# Patient Record
Sex: Male | Born: 1940 | Race: White | Hispanic: No | Marital: Married | Smoking: Former smoker
Health system: Southern US, Community
[De-identification: ages and names within clinical notes are randomized; demographics above are authoritative.]

## PROBLEM LIST (undated history)

## (undated) DIAGNOSIS — Z7189 Other specified counseling: Secondary | ICD-10-CM

## (undated) DIAGNOSIS — F32A Depression, unspecified: Secondary | ICD-10-CM

## (undated) DIAGNOSIS — I10 Essential (primary) hypertension: Secondary | ICD-10-CM

## (undated) DIAGNOSIS — D509 Iron deficiency anemia, unspecified: Secondary | ICD-10-CM

## (undated) DIAGNOSIS — I2510 Atherosclerotic heart disease of native coronary artery without angina pectoris: Secondary | ICD-10-CM

## (undated) DIAGNOSIS — K219 Gastro-esophageal reflux disease without esophagitis: Secondary | ICD-10-CM

## (undated) DIAGNOSIS — C9200 Acute myeloblastic leukemia, not having achieved remission: Secondary | ICD-10-CM

## (undated) DIAGNOSIS — F329 Major depressive disorder, single episode, unspecified: Secondary | ICD-10-CM

## (undated) DIAGNOSIS — E785 Hyperlipidemia, unspecified: Secondary | ICD-10-CM

## (undated) DIAGNOSIS — D4620 Refractory anemia with excess of blasts, unspecified: Secondary | ICD-10-CM

## (undated) DIAGNOSIS — N189 Chronic kidney disease, unspecified: Secondary | ICD-10-CM

## (undated) HISTORY — DX: Refractory anemia with excess of blasts, unspecified: D46.20

## (undated) HISTORY — PX: COLONOSCOPY: SHX174

## (undated) HISTORY — DX: Iron deficiency anemia, unspecified: D50.9

## (undated) HISTORY — DX: Atherosclerotic heart disease of native coronary artery without angina pectoris: I25.10

## (undated) HISTORY — PX: EYE SURGERY: SHX253

## (undated) HISTORY — PX: TONSILLECTOMY: SUR1361

## (undated) HISTORY — DX: Major depressive disorder, single episode, unspecified: F32.9

## (undated) HISTORY — DX: Hyperlipidemia, unspecified: E78.5

## (undated) HISTORY — DX: Essential (primary) hypertension: I10

## (undated) HISTORY — DX: Gastro-esophageal reflux disease without esophagitis: K21.9

## (undated) HISTORY — PX: ANGIOPLASTY: SHX39

## (undated) HISTORY — DX: Depression, unspecified: F32.A

---
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
## 2000-10-09 ENCOUNTER — Encounter: Payer: Self-pay | Admitting: Internal Medicine

## 2000-10-09 ENCOUNTER — Encounter: Admission: RE | Admit: 2000-10-09 | Discharge: 2000-10-09 | Payer: Self-pay | Admitting: Internal Medicine

## 2000-11-08 ENCOUNTER — Ambulatory Visit (HOSPITAL_COMMUNITY): Admission: RE | Admit: 2000-11-08 | Discharge: 2000-11-08 | Payer: Self-pay | Admitting: *Deleted

## 2000-11-08 ENCOUNTER — Encounter (INDEPENDENT_AMBULATORY_CARE_PROVIDER_SITE_OTHER): Payer: Self-pay | Admitting: Specialist

## 2001-07-31 ENCOUNTER — Encounter: Payer: Self-pay | Admitting: Family Medicine

## 2001-07-31 ENCOUNTER — Encounter: Admission: RE | Admit: 2001-07-31 | Discharge: 2001-07-31 | Payer: Self-pay | Admitting: Family Medicine

## 2002-03-03 ENCOUNTER — Encounter: Admission: RE | Admit: 2002-03-03 | Discharge: 2002-03-03 | Payer: Self-pay | Admitting: Surgery

## 2002-03-03 ENCOUNTER — Encounter: Payer: Self-pay | Admitting: Surgery

## 2003-04-22 ENCOUNTER — Ambulatory Visit (HOSPITAL_COMMUNITY): Admission: RE | Admit: 2003-04-22 | Discharge: 2003-04-23 | Payer: Self-pay | Admitting: Interventional Cardiology

## 2003-04-26 ENCOUNTER — Ambulatory Visit (HOSPITAL_COMMUNITY): Admission: RE | Admit: 2003-04-26 | Discharge: 2003-04-27 | Payer: Self-pay | Admitting: Interventional Cardiology

## 2004-05-19 ENCOUNTER — Encounter (INDEPENDENT_AMBULATORY_CARE_PROVIDER_SITE_OTHER): Payer: Self-pay | Admitting: Specialist

## 2004-05-19 ENCOUNTER — Ambulatory Visit (HOSPITAL_COMMUNITY): Admission: RE | Admit: 2004-05-19 | Discharge: 2004-05-19 | Payer: Self-pay | Admitting: *Deleted

## 2004-10-03 ENCOUNTER — Ambulatory Visit: Payer: Self-pay | Admitting: Internal Medicine

## 2004-12-19 ENCOUNTER — Ambulatory Visit: Payer: Self-pay | Admitting: Internal Medicine

## 2008-12-10 ENCOUNTER — Encounter: Admission: RE | Admit: 2008-12-10 | Discharge: 2008-12-10 | Payer: Self-pay | Admitting: Family Medicine

## 2010-02-15 ENCOUNTER — Encounter: Admission: RE | Admit: 2010-02-15 | Discharge: 2010-02-15 | Payer: Self-pay | Admitting: Family Medicine

## 2010-10-27 NOTE — Op Note (Signed)
NAMEEMIN, FOREE              ACCOUNT NO.:  [id number]   MEDICAL RECORD NO.:  [id number]          PATIENT TYPE:  AMB   LOCATION:  ENDO                         FACILITY:  [facility]   PHYSICIAN:  Georgiana Spinner, M.D.    DATE OF BIRTH:  31-Aug-1940   DATE OF PROCEDURE:  05/19/2004  DATE OF DISCHARGE:                                 OPERATIVE REPORT   PROCEDURE:  Colonoscopy with biopsy and polypectomy.   INDICATIONS:  Colon polyps, colon cancer screening, patient declined upper  endoscopy despite having symptoms of possible reflux and being told of  screening for Barrett's esophagus.  The anesthesia for this procedure was  Demerol 70 mg, Versed 8 mg.   DESCRIPTION OF PROCEDURE:  With patient mildly sedated in the left lateral  decubitus position, the Olympus videoscopic colonoscope was inserted in the  rectum after normal rectal exam and passed under direct vision to the cecum,  identified by ileocecal valve and appendiceal orifice, both of which were  photographed.  From this point, the colonoscope was slowly withdrawn, taking  circumferential views of the colonic mucosa, stopping first in the splenic  flexure area where there was a polyp seen, photographed, and removed using  hot biopsy forceps technique setting of 20-20 blended current.  We next  stopped in the sigmoid colon where a second polyp was noted, and it too was  removed using hot biopsy forceps technique with the same setting.  A third  polyp was seen in the rectum on direct view which was removed using a snare  cautery technique, and tissue was retrieved for pathology.  The endoscope  was then placed in the retroflexion to view the anal canal from above which  showed internal hemorrhoids.  The endoscope was straightened and withdrawn.  The patient's vital signs and pulse oximeter remained stable.  The patient  tolerated the procedure well without apparent complications.   FINDINGS:  1.  Polyps of splenic flexure, sigmoid  colon, and rectum all removed.  2.  Internal hemorrhoids.   PLAN:  1.  Await biopsy report.  2.  The patient will call me for results and follow up with me as an      outpatient.      GMO/MEDQ  D:  05/19/2004  T:  05/19/2004  Job:  [id number]   cc:   Dellis Anes. Idell Pickles, M.D.  [address]  [place]  [place] [address]  Fax: [phone]

## 2010-10-27 NOTE — Cardiovascular Report (Signed)
   NAME:  Anthony [name], Anthony [name]                        ACCOUNT NO.:  [id number]   MEDICAL RECORD NO.:  [id number]                   PATIENT TYPE:  OIB   LOCATION:  2854                                 FACILITY:  [facility]   PHYSICIAN:  Lesleigh Noe, M.D.            DATE OF BIRTH:  Jun 26, 1940   DATE OF PROCEDURE:  04/26/2003  DATE OF DISCHARGE:                              CARDIAC CATHETERIZATION   INDICATIONS:  High grade proximal LAD lesion.   PROCEDURE PERFORMED:  1. Left heart catheterization.  2. Selective coronary angiogram.  3. Drug-eluting stent left anterior descending proximal segment.   DESCRIPTION OF PROCEDURE:  After informed consent, a 6-French sheath was  placed.  Diagnostic right and left coronary angiography was performed.  We  initially attempted angioplasty on the LAD with a 6-French system but this  did not provide adequate backup.   We upgraded to a 7-French #4 left Judkins guide catheter, Prowater Asahi  guidewire and used a 2.5 x 15 Quantum Maverick which would not cross the  lesion and subsequent CrossSail/Voyager 2.5 x 15 balloon for pre dilatation.  We then placed a Taxus 16 mm x 2.75 mm stent and deployed to 14 atmospheres  which was an effective diameter of 3.0 mm.  Two balloon inflations were  performed.  The patient tolerated the procedure without complications.   The patient was already on Plavix and a bolus followed by infusion of  Angiomax was used.  ACT was documented to be greater than 400 prior to  intracoronary manipulations.  The right coronary which was stented on  April 22, 2003 was widely patent in the region of stent implantation.   CONCLUSION:  1. Successful drug-eluting stent implantation in the proximal left anterior     descending from 85% to 0% with TIMI 3 flow.  2. Continued wide patency of the right coronary artery without evidence of     restenosis or thrombus.   PLAN:  Aspirin and Plavix.  Angiomax was discontinued.  Discharge  April 27, 2003.                                               Lesleigh Noe, M.D.    HWS/MEDQ  D:  04/26/2003  T:  04/26/2003  Job:  [id number]

## 2010-10-27 NOTE — Procedures (Signed)
[facility]  Patient:    Anthony [name], Anthony [name]                  MRN: [id number] Proc. Date: 11/08/00 Adm. Date:  [id number] Attending:  Sabino Gasser                           Procedure Report  PROCEDURE:  Colonoscopy.  INDICATIONS:  Colon polyp.  ANESTHESIA:  Demerol 60 mg, Versed 6 mg.  DESCRIPTION OF PROCEDURE:  With the patient mildly sedated in the left lateral decubitus position, a rectal examination was performed -- which was unremarkable subsequently.  The Olympus videoscopic colonoscope was inserted into the rectum and passed under direct vision to the cecum; identified by the ileocecal valve and appendiceal orifice, both of which were photographed. From this point the colonoscope was slowly withdrawn, taking circumferential views of the entire colonic mucosa, stopping only in the rectum; where a small polyp was seen, photographed and removed using snare cautery technique at a setting of 20/20 blended current.  The endoscope was then placed in retroflexion to the anal canal from above, and this was photographed.  Small hemorrhoids were seen.  The endoscope was straightened and withdrawn through the anal canal.  A skin tag, representing an old external hemorrhoid, was noted and photographed.  The patients vital signs and pulse oximetry remained stable.  The patient tolerated the procedure well and without apparent complications.  FINDINGS:  Rectal polyp, internal and external hemorrhoids.  Otherwise unremarkable colonoscopic examination to the cecum.  PLAN:  Await biopsy report and have the patient call me for results.  To follow up with me as an outpatient. DD:  11/08/00 TD:  11/08/00 Job: [id number]

## 2010-10-27 NOTE — Discharge Summary (Signed)
NAME:  Anthony [name], Anthony [name]                        ACCOUNT NO.:  [id number]   MEDICAL RECORD NO.:  [id number]                   PATIENT TYPE:  OIB   LOCATION:  6522                                 FACILITY:  [facility]   PHYSICIAN:  Lyn Records, M.D.                DATE OF BIRTH:  [birth year]   DATE OF ADMISSION:  04/22/2003  DATE OF DISCHARGE:  04/23/2003                                 DISCHARGE SUMMARY   ADMISSION DIAGNOSES:  1. Chest pain, suspected coronary artery disease.  2. Chronic obstructive pulmonary disease .  3. Gastroesophageal reflux disease.  4. Hypertension.   DISCHARGE DIAGNOSES:  1. Coronary artery disease, status post percutaneous transluminal coronary     angioplasty and stent placement to the right coronary artery with     residual disease to the left anterior descending artery and diagonal.  2. Hypertension, controlled.  3. Gastroesophageal reflux disease, stable.  4. Chronic obstructive pulmonary disease, stable.   PROCEDURE:  Left heart catheterization, April 22, 2003.   COMPLICATIONS:  None.   DISCHARGE STATUS:  Stable.  Improved.   HISTORY OF PRESENT ILLNESS:  Anthony [name] is a very pleasant 70 year old male  who has had a longstanding history of reflux-type symptoms.  Initial  evaluation approximately a year ago involved stress Cardiolite study that  showed normal LV function and questionable inferior basil ischemia.  Because  the patient was essentially asymptomatic, no other interventions or  diagnostics were performed.  He returns at the suggestion of Dr. Idell Pickles with  episodes of chest discomfort and dyspnea on exertion.  He was set up for  outpatient cardiac catheterization.   PHYSICAL EXAMINATION:  Please see complete note in chart.  VITAL SIGNS:  Essentially, vital signs are stable.  GENERAL:  He is in no acute distress.  Breath sounds are clear.  HEART:  Regular rate and rhythm with an S4.  EXTREMITIES:He had no peripheral edema.  Peripheral  pulses were 2+  bilaterally.   LABORATORY DATA:  EKG showed a normal sinus rhythm with no ischemic changes.   CBC, CMP and coag studies were all normal.   HOSPITAL COURSE:  Patient presented to the cardiac cath lab on April 22, 2003.  Dr. Katrinka Blazing did a diagnostic heart cath which revealed a normal left  main.  LAD had a proximal area of 80% calcification.  Circumflex was okay.  RCA had an area proximally of 95%.  He underwent successful PCI and Taxus  stent placement to the RCA with TIMI III flow post procedure.  It was  decided to bring him back in approximately one week for intervention to the  LAD.   Patient tolerated the procedure well.  Vital signs remained stable.  Sheath  removal was without incident.  He was discharged home on April 23, 2003.   DISCHARGE MEDICATIONS:  1. Aspirin 81 mg daily.  2. Plavix 75 mg daily.  3. Toprol  XL 50 mg daily.  4. Micardis 40 mg daily.  5. MVI daily.   He has been instructed not to undertake any strenuous activity over the next  three days.  He is not  to lift anything heavier than 5 or 10 pounds.  He is  not to do a lot of bending, stooping, or straining.  He is to maintain a low  salt, low fat, low cholesterol diet.  He may shower and gently wash his  right groin cath site with warm soap and water.  He was instructed that if  he has any pain, swelling, or excessive bruising to his cath site, he is to  contact Dr. Michaelle Copas office.   Patient has elected to proceed with PCI as soon as possible.  Therefore, he  has been scheduled for PCI to the LAD diagonal on Monday, April 26, 2003.  He will have a BMET drawn STAT the morning of the procedure.  He has been  instructed to return to the hospital at 6 a.m. to initiate pre cath orders.      Adrian Saran, N.P.                        Lyn Records, M.D.    HB/MEDQ  D:  04/23/2003  T:  04/23/2003  Job:  [id number]

## 2010-10-27 NOTE — Cardiovascular Report (Signed)
NAME:  Anthony [name], Anthony [name]                        ACCOUNT NO.:  [id number]   MEDICAL RECORD NO.:  [id number]                   PATIENT TYPE:  OIB   LOCATION:  6522                                 FACILITY:  [facility]   PHYSICIAN:  Lesleigh Noe, M.D.            DATE OF BIRTH:  1941/05/16   DATE OF PROCEDURE:  04/22/2003  DATE OF DISCHARGE:                              CARDIAC CATHETERIZATION   INDICATIONS FOR PROCEDURE:  Recent anginal episodes.  Prior history of  coronary calcification documented by multislice CT scan. Prior mildly  abnormal inferior wall perfusion by Cardiolite one year ago.   DATE OF PROCEDURE:  April 22, 2003.   PROCEDURE PERFORMED:  1. Left heart catheterization.  2. Selective coronary angiography.  3. Left ventriculography.  4. Drug-eluting stent in the proximal right coronary.   DESCRIPTION:  After informed consent, a 5-French sheath was placed in the  right femoral artery using modified Seldinger technique.  A 4-French  preformed Judkins #4 left and right catheters were used for left  ventriculography, right coronary angiography and left coronary angiography  respectively.  After reviewing the scintiangiograms, it was felt that both  the LAD and the right coronary needed PCI.  Because of the RCA was the  largest and most likely the vessel causing symptoms, we felt that  intervention on this vessel was necessary.  We upgraded to a 7-French  system, give a double bolus followed by infusion of  Integrelin and a  heparin bolus of 4500 units.  ACT was documented to be greater than 230 and  the procedure was started.  We used a short IQ steerable guide wire, a JR-4  side hole 7-French guide catheter. After safely passing two IQ wires into  the distal right coronary for vessel straightening and support, we did  angioplasty using a 15 x 3.0 Maverick balloon.  One balloon inflation at 10  atmospheres was performed and then a 16-mm  long by 3.0 mm diameter  Taxus  stent was deployed within the previously predilated region.  Peak pressure  was 16 atmospheres.  Chest discomfort occurred with each inflation.  Post  procedure angiographic results were very acceptable without evidence of  reduced flow, dissection or thrombus. The case was terminated.  ACT post  procedure is not known by me at this time.  Integrelin will infuse for 18  hours.   RESULTS:   I. HEMODYNAMIC DATA:  A.  Aortic pressure 141/73.  B.  Left ventricular pressure 141/17.   II. LEFT VENTRICULOGRAPHY:  The left ventricle size and function is normal.  EF is 70%.  No MR.   III. CORONARY ANGIOGRAPHY:  Heavy coronary calcification is noted.  A.  Left main coronary:  Free of any significant obstruction.  B.  Left anterior descending coronary:  The LAD is small, does not reach the  left ventricular apex.  It gives origin to two large septal perforators.  There is segmental heavy calcium in the proximal and mid RCA and there is a  75-85% segmental stenosis in the proximal segment extending into the mid  segment.  Diffuse disease is noted beyond the second septal perforator.  C.  Circumflex artery:  Circumflex is large.  Gives several branches and is  free of any significant obstruction.  D.  Right coronary:  The right coronary artery is large, tortuous, calcified  and continues in eccentric 90% proximal stenosis.  Moderate irregularities  noted in the mid RCA and 50% stenosis in the proximal PDA.   IV. PERCUTANEOUS CORONARY INTERVENTION:  After treatment as described above,  the 90-95% proximal RCA stenosis was reduced to 0% with TIMI-3 flow with  Taxus 16 x 3.0 stent.   CONCLUSIONS:  1. Severe calcified coronary artery disease with significant proximal left     anterior descending (left anterior descending is relatively small) and     high grade proximal right coronary artery.  The circumflex is free of any     significant disease.  2. Normal left ventricular function.  3.  Successful drug-eluting Taxus stent implantation into the proximal right     coronary artery reducing the stenosis from 90% to 0%.   PLAN:  1. Integrelin x18 hours.  2. Aspirin and Plavix.  3. Discharge in a left anterior descending intervention in one to two weeks.                                               Lesleigh Noe, M.D.    HWS/MEDQ  D:  04/22/2003  T:  04/23/2003  Job:  [id number]   cc:   Dellis Anes. Idell Pickles, M.D.  [address]  [place]  [place] [address]  Fax: [phone]

## 2010-11-24 ENCOUNTER — Inpatient Hospital Stay (HOSPITAL_BASED_OUTPATIENT_CLINIC_OR_DEPARTMENT_OTHER)
Admission: RE | Admit: 2010-11-24 | Discharge: 2010-11-24 | Disposition: A | Discharge status: Home / Self Care | Payer: Medicare Other | Source: Ambulatory Visit | Attending: Interventional Cardiology | Admitting: Interventional Cardiology

## 2010-11-24 DIAGNOSIS — R5383 Other fatigue: Secondary | ICD-10-CM | POA: Insufficient documentation

## 2010-11-24 DIAGNOSIS — T82897A Other specified complication of cardiac prosthetic devices, implants and grafts, initial encounter: Secondary | ICD-10-CM | POA: Insufficient documentation

## 2010-11-24 DIAGNOSIS — R5381 Other malaise: Secondary | ICD-10-CM | POA: Insufficient documentation

## 2010-11-24 DIAGNOSIS — I2510 Atherosclerotic heart disease of native coronary artery without angina pectoris: Secondary | ICD-10-CM | POA: Insufficient documentation

## 2010-11-24 DIAGNOSIS — Y831 Surgical operation with implant of artificial internal device as the cause of abnormal reaction of the patient, or of later complication, without mention of misadventure at the time of the procedure: Secondary | ICD-10-CM | POA: Insufficient documentation

## 2010-12-14 NOTE — Cardiovascular Report (Signed)
  [name], Anthony NO.:  [id number]  MEDICAL RECORD NO.:  [id number]  LOCATION:                                 FACILITY:  PHYSICIAN:  Lyn Records, M.D.   DATE OF BIRTH:  May 29, 1941  DATE OF PROCEDURE:  11/24/2010 DATE OF DISCHARGE:                           CARDIAC CATHETERIZATION   ADDENDUM  The patient received a total of 75 mL of contrast.     Lyn Records, M.D.     HWS/MEDQ  D:  11/24/2010  T:  11/25/2010  Job:  [id number]  Electronically Signed by Verdis Prime M.D. on 12/14/2010 01:38:16 PM

## 2010-12-14 NOTE — Cardiovascular Report (Signed)
NAMEMARVIN, [name] NO.:  [id number]  MEDICAL RECORD NO.:  [id number]  LOCATION:                                 FACILITY:  PHYSICIAN:  Anthony [name], M.D.        DATE OF BIRTH:  DATE OF PROCEDURE:  11/24/2010 DATE OF DISCHARGE:                           CARDIAC CATHETERIZATION   INDICATIONS:  The patient has experienced a 11-month history of exertional fatigue.  No chest pain.  He has a prior history of exertional fatigue that identified severe LAD and right coronary artery disease resulting in stenting.  A recent nuclear study suggested inferobasal and mid inferior wall ischemia.  Therefore, this study is being done to rule out restenosis and/or progression of coronary artery disease.  The patient is known to have hypertension, hyperlipidemia, and renal insufficiency.  Lisinopril has been held for 48 hours prior to the procedure.  We are cognizant of contrast potential for renal injury.  PROCEDURES PERFORMED: 1. Left heart catheterization. 2. Left ventriculography by hand injection. 3. Selective coronary angiography.  DESCRIPTION:  A 4-French sheath was placed after 1% Xylocaine local infiltration of the right femoral.  The modified Seldinger technique was used.  A 4-French #4 left Judkins catheter was used for left coronary angiography.  A 4-French modified right coronary catheter was used for right coronary angiography.  The pigtail catheter was used for hand injection of the left ventricle.  200 mcg of intracoronary nitroglycerin was given in the right coronary artery.  The patient tolerated the procedure without complications.  RESULTS: 1. Hemodynamic data:     a.     Aortic pressure 135/68.     b.     Left ventricular pressure 130/27. 2. Left ventriculography:  There is mild mid to distal inferior wall     hypokinesis.  EF is greater than 60%. 3. Coronary angiography:  Heavy three-vessel coronary calcification is     noted with  cinefluoroscopy.     a.     Left main coronary artery:  Widely patent.     b.     Left anterior descending coronary artery:  LAD contains a      proximal stent that is widely patent.  Proximal to the stent,      there is 50% obstruction.  The LAD is otherwise relatively small      in distribution supplying an anterior wall, but does not wrap      around the left ventricular apex.  The vessel is heavily calcified      in the mid segment.  There is 50-70% stenosis in the mid vessel      overlapping origin of the first septal perforator.     c.     Circumflex artery:  The circumflex coronary artery is      moderately to heavily calcified.  There are luminal      irregularities.  No high-grade distal obstruction is noted.     d.     Right coronary artery:  The right coronary artery is heavily      calcified.  The proximal stent is widely patent.  There is heavy  calcification throughout the proximal and mid right coronary      artery.  The mid segment is highly diseased with calcification and      moderate stenosis.  No obstruction greater than 60% is noted.  The      distal portion of the right coronary artery is large.  The PDA      wraps around the left ventricular apex.  The PDA stent is patent      with perhaps 50%-60% ISR in the distal margin.  This likely does      not account for the patient's abnormal appearance on nuclear      testing since the apex was not involved and the area of possible      ischemia was inferobasal and mid inferior wall.  CONCLUSIONS: 1. Moderate to moderately severe mid LAD disease.  In the absence of     ischemia on the nuclear study, this area will be managed medically.     The LAD stents in the proximal vessel is widely patent. 2. The right coronary artery stents are patent.  There is mild to     moderate distal PDA ISR.  The PDA is large and beyond the stent     supplies a mid distal and apical left ventricular wall. 3. The abnormal nuclear study  likely represents artifact perhaps due     the diaphragm attenuation as there is no disease that would account     for inferobasal ischemia. 4. Normal left ventricular function.  PLAN:  Continue medical therapy and aggressive risk factor modification. If the patient develops angina or progressive symptoms or at some point in the future LAD territory ischemia or distal inferior wall apical ischemia develop, we would consider repeat cath or PCI.     Anthony [name], M.D.     HWS/MEDQ  D:  11/24/2010  T:  11/25/2010  Job:  [id number]  cc:   Anthony [name], M.D.  Electronically Signed by Verdis Prime M.D. on 12/14/2010 01:38:20 PM

## 2011-01-26 ENCOUNTER — Other Ambulatory Visit: Payer: Self-pay | Admitting: Family Medicine

## 2011-11-26 ENCOUNTER — Other Ambulatory Visit: Payer: Self-pay | Admitting: Family Medicine

## 2012-06-17 ENCOUNTER — Ambulatory Visit (INDEPENDENT_AMBULATORY_CARE_PROVIDER_SITE_OTHER)
Admission: RE | Admit: 2012-06-17 | Discharge: 2012-06-17 | Disposition: A | Discharge status: Home / Self Care | Payer: Medicare Other | Source: Ambulatory Visit | Attending: Pulmonary Disease | Admitting: Pulmonary Disease

## 2012-06-17 ENCOUNTER — Ambulatory Visit (INDEPENDENT_AMBULATORY_CARE_PROVIDER_SITE_OTHER): Payer: Medicare Other | Admitting: Pulmonary Disease

## 2012-06-17 ENCOUNTER — Encounter: Payer: Self-pay | Admitting: Pulmonary Disease

## 2012-06-17 VITALS — BP 138/82 | HR 67 | Temp 97.7°F | Ht 72.0 in | Wt 218.2 lb

## 2012-06-17 DIAGNOSIS — J449 Chronic obstructive pulmonary disease, unspecified: Secondary | ICD-10-CM

## 2012-06-17 NOTE — Assessment & Plan Note (Signed)
The patient has moderate airflow obstruction by his recent spirometry, but is remaining very functional and not having acute exacerbations.  Although he is not dissatisfied with his exertional tolerance, he does wish that he could do better.  I have explained that part of the problem may be his weight and conditioning, but I think we should give him a trial of a LABA added to his Spiriva to see if that makes a difference.  If it does not, I would maintain him on Spiriva alone.  He will also need to have a rescue inhaler available for emergency use.  We have checked his ambulatory oximetry today in the office, and the patient does not have any significant desaturation.

## 2012-06-17 NOTE — Patient Instructions (Addendum)
Stay on spiriva once a day Will try arcapta one inhalation each am for next 3 weeks.  Let me know if you think this makes a significant difference in your breathing. Will start on an albuterol inhaler only for emergencies.  Can take 2 inhalations every 6 hrs if needed. Will check cxr today, and call you with results. Work on [employer] loss and conditioning program. followup with me in 6mos to see how things are going.  Please call if having issues with your breathing.

## 2012-06-17 NOTE — Progress Notes (Signed)
  Subjective:    Patient ID: Anthony [name], male    DOB: 03/05/1941, 71 y.o.   MRN: [id number]  HPI The patient is a 72 year old male who been asked to see for management of COPD.  The patient tells me that he was diagnosed about 6 years ago, and has been on Spiriva from [place] for years.  He has had recent spirometry with his primary physician, and found to have moderate airflow obstruction.  The patient is very active, and goes to the [facility] 2-3 times a week.  He walks at least a mile without severe shortness of breath, but can get winded up any incline or if he has a load in his arms.  He has no significant cough or mucus production, and has not smoked since 2000.  He has not had a recent chest x-ray.  He does have underlying coronary disease, but tells me that he has never had a myocardial infarction.  He does have some intermittent lower extremity edema.  The patient states that his weight is up 10 pounds over the last 1-2 years.  The patient is concerned about his long-term prognosis and functional status.   Review of Systems  Constitutional: Negative for fever and unexpected weight change.  HENT: Positive for congestion, rhinorrhea and sneezing. Negative for ear pain, nosebleeds, sore throat, trouble swallowing, dental problem, postnasal drip and sinus pressure.   Eyes: Negative for redness and itching.  Respiratory: Positive for shortness of breath and wheezing. Negative for cough and chest tightness.   Cardiovascular: Negative for palpitations and leg swelling.  Gastrointestinal: Negative for nausea and vomiting.       Acid heartburn  Genitourinary: Negative for dysuria.  Musculoskeletal: Positive for joint swelling and arthralgias.  Skin: Negative for rash.  Neurological: Negative for headaches.  Hematological: Does not bruise/bleed easily.  Psychiatric/Behavioral: Positive for dysphoric mood. The patient is nervous/anxious.        Objective:   Physical Exam Constitutional:   Overweight male, no acute distress  HENT:  Nares patent without discharge  Oropharynx without exudate, palate and uvula are normal  Eyes:  Perrla, eomi, no scleral icterus  Neck:  No JVD, no TMG  Cardiovascular:  Normal rate, regular rhythm, no rubs or gallops.  No murmurs        Intact distal pulses  Pulmonary :  decreased breath sounds, no stridor or respiratory distress   No rales, rhonchi, or wheezing  Abdominal:  Soft, nondistended, bowel sounds present.  No tenderness noted.   Musculoskeletal:  mild lower extremity edema noted.  Lymph Nodes:  No cervical lymphadenopathy noted  Skin:  No cyanosis noted  Neurologic:  Alert, appropriate, moves all 4 extremities without obvious deficit.         Assessment & Plan:

## 2012-07-10 ENCOUNTER — Telehealth: Payer: Self-pay | Admitting: Pulmonary Disease

## 2012-07-10 MED ORDER — INDACATEROL MALEATE 75 MCG IN CAPS: 1.0000 | ORAL_CAPSULE | RESPIRATORY_TRACT | Status: DC

## 2012-07-10 NOTE — Telephone Encounter (Signed)
Pt states he has seen approx 25% improvement in his breathing since starting on Arcapta. Pt is requesting an RX. KC, pls advise if pt is to continue on this inhaler. Thanks!

## 2012-07-10 NOTE — Telephone Encounter (Signed)
Ok to give him 6mos supply.

## 2012-07-10 NOTE — Telephone Encounter (Signed)
Pt aware and asked that we send a 90 day supply to [facility] in [place]. RX sent.

## 2012-12-15 ENCOUNTER — Ambulatory Visit (INDEPENDENT_AMBULATORY_CARE_PROVIDER_SITE_OTHER): Payer: Medicare Other | Admitting: Pulmonary Disease

## 2012-12-15 ENCOUNTER — Encounter: Payer: Self-pay | Admitting: Pulmonary Disease

## 2012-12-15 VITALS — BP 126/78 | HR 66 | Temp 97.0°F | Ht 72.0 in | Wt 219.4 lb

## 2012-12-15 DIAGNOSIS — J449 Chronic obstructive pulmonary disease, unspecified: Secondary | ICD-10-CM

## 2012-12-15 NOTE — Assessment & Plan Note (Signed)
The pt is doing ok overall, but has had "spells" where his breathing has not done as well.  He admits that he has not used arcapta regularly with spiriva to see if it truly makes a difference or not.  He would like to take the next 30 days, and see if he does better on the dual therapy.  If not, would consider a trial of anoro.  I have also stressed to him the importance of weight loss and conditioning.

## 2012-12-15 NOTE — Progress Notes (Signed)
  Subjective:    Patient ID: Anthony [name], male    DOB: 1941/04/11, 72 y.o.   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  At the last visit, I have given him a trial arcapta with his Spiriva to see if he would notice a difference.  He has tried the arcapta off and on since that time, but admits that he never really took it on a consistent basis to see if that would make a difference.  This summer, he is seen in variable worsening of his breathing at times, but currently is not far from his usual baseline.  It is unclear how much of this is from the heat and humidity.  He denies any significant chest congestion, purulent it is, or symptoms of an acute exacerbation.  The patient is also acutely aware of his concomitant heart disease, and wonders if this could be playing a role.   Review of Systems  Constitutional: Negative for fever and unexpected weight change.  HENT: Negative for ear pain, nosebleeds, congestion, sore throat, rhinorrhea, sneezing, trouble swallowing, dental problem, postnasal drip and sinus pressure.   Eyes: Negative for redness and itching.  Respiratory: Positive for shortness of breath. Negative for cough, chest tightness and wheezing.   Cardiovascular: Negative for palpitations and leg swelling.  Gastrointestinal: Negative for nausea and vomiting.  Genitourinary: Negative for dysuria.  Musculoskeletal: Negative for joint swelling.  Skin: Negative for rash.  Neurological: Negative for headaches.  Hematological: Does not bruise/bleed easily.  Psychiatric/Behavioral: Negative for dysphoric mood. The patient is not nervous/anxious.        Objective:   Physical Exam Ow male in nad Nose without purulence or discharge noted. Neck without LN or TMG Chest with clear bs, no wheezing. Cor with rrr LE without edema, no cyanosis Alert, oriented, moves all 4.        Assessment & Plan:

## 2012-12-15 NOTE — Patient Instructions (Addendum)
Take 30 days and stay on spiriva and arcapta everyday.  Please call at the end of that time period to give me an update, and we can discuss your maintenance meds going forward. Work on conditioning and weight loss followup with me in 6mos.

## 2013-03-23 DIAGNOSIS — K37 Unspecified appendicitis: Secondary | ICD-10-CM | POA: Insufficient documentation

## 2013-06-11 DIAGNOSIS — N189 Chronic kidney disease, unspecified: Secondary | ICD-10-CM

## 2013-06-11 HISTORY — DX: Chronic kidney disease, unspecified: N18.9

## 2013-06-16 ENCOUNTER — Ambulatory Visit (INDEPENDENT_AMBULATORY_CARE_PROVIDER_SITE_OTHER): Payer: Medicare Other | Admitting: Pulmonary Disease

## 2013-06-16 ENCOUNTER — Encounter: Payer: Self-pay | Admitting: Pulmonary Disease

## 2013-06-16 VITALS — BP 114/70 | HR 68 | Temp 97.7°F | Ht 72.0 in | Wt 210.6 lb

## 2013-06-16 DIAGNOSIS — J449 Chronic obstructive pulmonary disease, unspecified: Secondary | ICD-10-CM

## 2013-06-16 NOTE — Assessment & Plan Note (Signed)
The patient appears to be stable from a pulmonary standpoint on Spiriva alone. He is satisfied with his exertional tolerance, and really did not see a difference with the addition of arcapta. I've asked him to stay on his current medications, and to work on conditioning.

## 2013-06-16 NOTE — Patient Instructions (Signed)
No change in current medications. Try and stay active, and work on weight loss followup with me again in 43mos.

## 2013-06-16 NOTE — Progress Notes (Signed)
   Subjective:    Patient ID: Anthony [name], male    DOB: 09-25-1940, 73 y.o.   MRN: [id number]  HPI Patient comes in today for followup of his known COPD. He is doing very well on his current medication, and really did not see a big change in his breathing with the addition of arcapta. He is been trying to stay active, but has been limited by a recent appendectomy. He denies any cough, congestion, or purulent mucus.   Review of Systems  Constitutional: Negative for fever and unexpected weight change.  HENT: Negative for congestion, dental problem, ear pain, nosebleeds, postnasal drip, rhinorrhea, sinus pressure, sneezing, sore throat and trouble swallowing.   Eyes: Negative for redness and itching.  Respiratory: Negative for cough, chest tightness, shortness of breath and wheezing.   Cardiovascular: Negative for palpitations and leg swelling.  Gastrointestinal: Negative for nausea and vomiting.  Genitourinary: Negative for dysuria.  Musculoskeletal: Negative for joint swelling.  Skin: Negative for rash.  Neurological: Negative for headaches.  Hematological: Does not bruise/bleed easily.  Psychiatric/Behavioral: Negative for dysphoric mood. The patient is not nervous/anxious.        Objective:   Physical Exam Overweight male in no acute distress Nose without purulence or discharge noted Neck without lymphadenopathy or thyromegaly Chest with mildly decreased breath sounds, no wheezing Cardiac exam with regular rate and rhythm Lower extremities with trace ankle edema, no cyanosis Alert and oriented, moves all 4 extremities.       Assessment & Plan:

## 2013-06-17 ENCOUNTER — Ambulatory Visit: Payer: Medicare Other | Admitting: Pulmonary Disease

## 2013-09-07 ENCOUNTER — Encounter: Payer: Self-pay | Admitting: Interventional Cardiology

## 2013-09-07 ENCOUNTER — Ambulatory Visit (INDEPENDENT_AMBULATORY_CARE_PROVIDER_SITE_OTHER): Payer: Medicare Other | Admitting: Interventional Cardiology

## 2013-09-07 ENCOUNTER — Telehealth: Payer: Self-pay | Admitting: Interventional Cardiology

## 2013-09-07 VITALS — BP 120/67 | HR 67 | Ht 72.0 in | Wt 213.0 lb

## 2013-09-07 DIAGNOSIS — R0600 Dyspnea, unspecified: Secondary | ICD-10-CM

## 2013-09-07 DIAGNOSIS — R0609 Other forms of dyspnea: Secondary | ICD-10-CM

## 2013-09-07 DIAGNOSIS — I10 Essential (primary) hypertension: Secondary | ICD-10-CM | POA: Insufficient documentation

## 2013-09-07 DIAGNOSIS — R0789 Other chest pain: Secondary | ICD-10-CM | POA: Insufficient documentation

## 2013-09-07 DIAGNOSIS — R42 Dizziness and giddiness: Secondary | ICD-10-CM

## 2013-09-07 DIAGNOSIS — R0989 Other specified symptoms and signs involving the circulatory and respiratory systems: Secondary | ICD-10-CM

## 2013-09-07 DIAGNOSIS — E785 Hyperlipidemia, unspecified: Secondary | ICD-10-CM

## 2013-09-07 DIAGNOSIS — I2510 Atherosclerotic heart disease of native coronary artery without angina pectoris: Secondary | ICD-10-CM | POA: Insufficient documentation

## 2013-09-07 LAB — COMPREHENSIVE METABOLIC PANEL
ALT: 20 U/L (ref 0–53)
AST: 25 U/L (ref 0–37)
Albumin: 4.1 g/dL (ref 3.5–5.2)
Alkaline Phosphatase: 30 U/L — ABNORMAL LOW (ref 39–117)
BILIRUBIN TOTAL: 0.4 mg/dL (ref 0.3–1.2)
BUN: 29 mg/dL — ABNORMAL HIGH (ref 6–23)
CALCIUM: 9.4 mg/dL (ref 8.4–10.5)
CHLORIDE: 102 meq/L (ref 96–112)
CO2: 29 meq/L (ref 19–32)
CREATININE: 1.5 mg/dL (ref 0.4–1.5)
GFR: 47.02 mL/min — AB (ref >60.00)
GLUCOSE: 100 mg/dL — AB (ref 70–99)
Potassium: 4.5 mEq/L (ref 3.5–5.1)
SODIUM: 138 meq/L (ref 135–145)
TOTAL PROTEIN: 7.4 g/dL (ref 6.0–8.3)

## 2013-09-07 LAB — BRAIN NATRIURETIC PEPTIDE: PRO B NATRI PEPTIDE: 38 pg/mL (ref 0.0–100.0)

## 2013-09-07 NOTE — Progress Notes (Signed)
Patient ID: JORELL AGNE, male   DOB: Jan 03, 1941, 73 y.o.   MRN: [id number]    [address], [address] [place], [place]  [address] Phone: [phone] Fax:  [phone]  Date:  09/07/2013   ID:  JOAH PATLAN, DOB 08-03-1940, MRN [id number]  PCP:  Abigail Miyamoto, MD   ASSESSMENT:  1. Chest pain 2 Coronary artery disease with prior LAD (DES) and RCA (DES) stent 3. Dyspnea 4. Hypertension 5. Hyperlipidemia 6. Dizziness, with standing  PLAN:  1. Will do a pharmacologic stress nuclear perfusion study to rule out progression of underlying coronary disease given the history of atypical chest pain. 2. Because of dyspnea, we will check a BNP 3. Because of fatigue and hypertension I will check a metabolic panel to rule out electrolyte disturbance 4. If above studies are unremarkable, no further workup   SUBJECTIVE: Anthony [name] is a 73 y.o. male who is been experiencing orthostatic dizziness and chest pain. He called this morning and request an appointment. He is currently pain-free. He has known coronary disease with previous drug-eluting stents in the RCA and LAD (both her Taxus) catheterization 2012 revealed 50-70% mid LAD stenosis and distal RCA 50-70% stenosis. There was moderate LAD ISR. Stents were placed in 2004. He describes the chest pain is a mild heaviness that lasts minutes. It occurs at rest. It is not precipitated by a particular events. He also has been having lightheadedness/dizziness when he changes posture going from lying or sitting to standing. It takes several seconds with a failing to resolve.  His been having some lower abdominal discomfort.  He has a history of hypertension, COPD, and hyperlipidemia.   Wt Readings from Last 3 Encounters:  09/07/13 213 lb (96.616 kg)  06/16/13 210 lb 9.6 oz (95.528 kg)  12/15/12 219 lb 6.4 oz (99.519 kg)     Past Medical History  Diagnosis Date  . HTN (hypertension)   . Hyperlipidemia   . Coronary  atherosclerosis of native coronary artery   . LPRD (laryngopharyngeal reflux disease)   . Depressive disorder     Current Outpatient Prescriptions  Medication Sig Dispense Refill  . aspirin 81 MG tablet Take 81 mg by mouth daily.      Marland Kitchen BYSTOLIC 5 MG tablet Take 5 mg by mouth daily.       . Cholecalciferol (VITAMIN D3) 2000 UNITS TABS Take 2,000 Units by mouth daily.      . clopidogrel (PLAVIX) 75 MG tablet Take 75 mg by mouth daily.       Marland Kitchen co-enzyme Q-10 30 MG capsule Take 100 mg by mouth daily.      . Ferrous Sulfate (IRON) 325 (65 FE) MG TABS Take 1 tablet by mouth daily.      . fish oil-omega-3 fatty acids 1000 MG capsule Take 1 g by mouth daily.      . Flaxseed, Linseed, (FLAX SEED OIL) 1000 MG CAPS Take 1,000 mg by mouth daily.      Marland Kitchen losartan (COZAAR) 50 MG tablet Take 50 mg by mouth daily.       . meclizine (ANTIVERT) 25 MG tablet Take 25 mg by mouth 3 (three) times daily as needed.      . pantoprazole (PROTONIX) 40 MG tablet Take 40 mg by mouth daily.       . ranitidine (ZANTAC) 300 MG tablet Take 300 mg by mouth at bedtime.       . sertraline (ZOLOFT) 100 MG tablet Take  50 mg by mouth daily.       . simvastatin (ZOCOR) 40 MG tablet Take 40 mg by mouth every other day.       . tiotropium (SPIRIVA) 18 MCG inhalation capsule Place 18 mcg into inhaler and inhale daily.      . vitamin B-12 (CYANOCOBALAMIN) 1000 MCG tablet Take 1,000 mcg by mouth daily.       No current facility-administered medications for this visit.    Allergies:   No Known Allergies  Social History:  The patient  reports that he quit smoking about 15 years ago. His smoking use included Cigarettes. He has a 80 pack-year smoking history. He does not have any smokeless tobacco history on file. He reports that he drinks alcohol. He reports that he does not use illicit drugs.   ROS:  Please see the history of present illness.   Denies melena.   All other systems reviewed and negative.   OBJECTIVE: VS:  BP 129/66   Pulse 68  Ht 6' (1.829 m)  Wt 213 lb (96.616 kg)  BMI 28.88 kg/m2 no orthostasis is noted  Well nourished, well developed, in no acute distress, older than stated age 51: normal Neck: JVD flat. Carotid bruit absent  Cardiac:  normal S1, S2; RRR; no murmur Lungs:  clear to auscultation bilaterally, no wheezing, rhonchi or rales Abd: soft, nontender, no hepatomegaly Ext: Edema absent. Pulses 2+ and symmetric Skin: warm and dry Neuro:  CNs 2-12 intact, no focal abnormalities noted  EKG:  Normal sinus rhythm with normal tracing       Signed, Illene Labrador III, MD 09/07/2013 2:44 PM

## 2013-09-07 NOTE — Patient Instructions (Addendum)
Your physician recommends that you continue on your current medications as directed. Please refer to the Current Medication list given to you today.  Lab Today: Cmet,Bnp  Your physician has requested that you have en exercise stress myoview. For further information please visit [email or web address]. Please follow instruction sheet, as given.  Follow up pending test results

## 2013-09-07 NOTE — Telephone Encounter (Signed)
New message     Patient calling C/O stay very tired. Standing up gets dizzy , some slight pressure on chest - nothing urgent. Patient stated this is same symptoms he had before when he stent placement. Would like to have an appt today or this week.

## 2013-09-07 NOTE — Telephone Encounter (Signed)
returned pt call.pt sts that he is experiencing some dizziness and is tired all the time.pt has not checked his bp or heartrate.pt will ck his bp with his device at home and call back with reading.

## 2013-09-07 NOTE — Addendum Note (Signed)
Addended by: Eulis Foster on: 09/07/2013 03:29 PM   Modules accepted: Orders

## 2013-09-07 NOTE — Telephone Encounter (Signed)
pt called back with bp reading.132/83 hr 60bpm.pt sts that he is dizzy when he goes from sitting to standing.have some chest discomfort and would like to be seen.appt made for today @ 2:30pm with Dr.Smith.pt verbalized understanding.

## 2013-09-08 ENCOUNTER — Telehealth: Payer: Self-pay

## 2013-09-08 NOTE — Telephone Encounter (Signed)
pt given lab results.  Lab work is okay .pt verbalized understanding

## 2013-09-08 NOTE — Telephone Encounter (Signed)
Message copied by Lamar Laundry on Tue Sep 08, 2013 10:33 AM ------      Message from: Daneen Schick      Created: Mon Sep 07, 2013  5:33 PM       Lab work is okay ------

## 2013-09-17 ENCOUNTER — Ambulatory Visit (HOSPITAL_COMMUNITY): Payer: Medicare Other | Attending: Cardiology | Admitting: Radiology

## 2013-09-17 ENCOUNTER — Encounter: Payer: Self-pay | Admitting: Cardiology

## 2013-09-17 VITALS — BP 155/76 | HR 65 | Ht 72.0 in | Wt 210.0 lb

## 2013-09-17 DIAGNOSIS — R9431 Abnormal electrocardiogram [ECG] [EKG]: Secondary | ICD-10-CM | POA: Insufficient documentation

## 2013-09-17 DIAGNOSIS — Z8249 Family history of ischemic heart disease and other diseases of the circulatory system: Secondary | ICD-10-CM | POA: Insufficient documentation

## 2013-09-17 DIAGNOSIS — Z9861 Coronary angioplasty status: Secondary | ICD-10-CM | POA: Insufficient documentation

## 2013-09-17 DIAGNOSIS — J4489 Other specified chronic obstructive pulmonary disease: Secondary | ICD-10-CM | POA: Insufficient documentation

## 2013-09-17 DIAGNOSIS — I10 Essential (primary) hypertension: Secondary | ICD-10-CM | POA: Insufficient documentation

## 2013-09-17 DIAGNOSIS — R42 Dizziness and giddiness: Secondary | ICD-10-CM

## 2013-09-17 DIAGNOSIS — R0609 Other forms of dyspnea: Secondary | ICD-10-CM | POA: Insufficient documentation

## 2013-09-17 DIAGNOSIS — E785 Hyperlipidemia, unspecified: Secondary | ICD-10-CM

## 2013-09-17 DIAGNOSIS — Z87891 Personal history of nicotine dependence: Secondary | ICD-10-CM | POA: Insufficient documentation

## 2013-09-17 DIAGNOSIS — R079 Chest pain, unspecified: Secondary | ICD-10-CM | POA: Insufficient documentation

## 2013-09-17 DIAGNOSIS — R0602 Shortness of breath: Secondary | ICD-10-CM | POA: Insufficient documentation

## 2013-09-17 DIAGNOSIS — R0789 Other chest pain: Secondary | ICD-10-CM

## 2013-09-17 DIAGNOSIS — I2510 Atherosclerotic heart disease of native coronary artery without angina pectoris: Secondary | ICD-10-CM | POA: Insufficient documentation

## 2013-09-17 DIAGNOSIS — J449 Chronic obstructive pulmonary disease, unspecified: Secondary | ICD-10-CM | POA: Insufficient documentation

## 2013-09-17 DIAGNOSIS — R0989 Other specified symptoms and signs involving the circulatory and respiratory systems: Secondary | ICD-10-CM | POA: Insufficient documentation

## 2013-09-17 DIAGNOSIS — R0600 Dyspnea, unspecified: Secondary | ICD-10-CM

## 2013-09-17 MED ORDER — TECHNETIUM TC 99M SESTAMIBI GENERIC - CARDIOLITE
10.8000 | Freq: Once | INTRAVENOUS | Status: AC | PRN
  Administered 2013-09-17: 11 via INTRAVENOUS

## 2013-09-17 MED ORDER — REGADENOSON 0.4 MG/5ML IV SOLN
0.4000 mg | Freq: Once | INTRAVENOUS | Status: AC
  Administered 2013-09-17: 0.4 mg via INTRAVENOUS

## 2013-09-17 MED ORDER — TECHNETIUM TC 99M SESTAMIBI GENERIC - CARDIOLITE
33.0000 | Freq: Once | INTRAVENOUS | Status: AC | PRN
  Administered 2013-09-17: 33 via INTRAVENOUS

## 2013-09-17 NOTE — Progress Notes (Signed)
[name] [address] [place], [name] [address] [phone]    Cardiology Nuclear Med Study  Anthony [name] is a 73 y.o. male     MRN : [id number]     DOB: September 23, 1940  Procedure Date: 09/17/2013  Nuclear Med Background Indication for Stress Test:  Evaluation for Ischemia and Stent Patency History:  CAD, Cath, Stent (RCA, LAD), MPI ~4-5 yrs ago, COPD Cardiac Risk Factors: Family History - CAD, History of Smoking, Hypertension and Lipids  Symptoms:  Chest Pain, Dizziness and SOB   Nuclear Pre-Procedure Caffeine/Decaff Intake:  None > 12 hrs NPO After: 8:30pm   Lungs:  clear O2 Sat: 99% on room air. IV 0.9% NS with Angio Cath:  22g  IV Site: R Antecubital x 1, tolerated well IV Started by:  Anthony Baltimore, RN  Chest Size (in):  42 Cup Size: n/a  Height: 6' (1.829 m)  Weight:  210 lb (95.255 kg)  BMI:  Body mass index is 28.47 kg/(m^2). Tech Comments:  Held Bystolic x 24 hrs    Nuclear Med Study 1 or 2 day study: 1 day  Stress Test Type:  Treadmill/Lexiscan  Reading MD: N/A  Order Authorizing Provider:  Daneen Schick, III MD  Resting Radionuclide: Technetium 71m Sestamibi  Resting Radionuclide Dose: 11.0 mCi   Stress Radionuclide:  Technetium 28m Sestamibi  Stress Radionuclide Dose: 33.0 mCi           Stress Protocol Rest HR: 65 Stress HR: 106  Rest BP: 155/76 Stress BP: 172/55  Exercise Time (min): n/a METS: n/a           Dose of Adenosine (mg):  n/a Dose of Lexiscan: 0.4 mg  Dose of Atropine (mg): n/a Dose of Dobutamine: n/a mcg/kg/min (at max HR)  Stress Test Technologist: Anthony [name]  Nuclear Technologist:  Anthony [name]     Rest Procedure:  Myocardial perfusion imaging was performed at rest 45 minutes following the intravenous administration of Technetium 83m Sestamibi. Rest ECG: Normal  Stress Procedure:  The patient received IV Lexiscan 0.4 mg over 15-seconds with concurrent low level exercise and then Technetium  28m Sestamibi was injected at 30-seconds while the patient continued walking one more minute.  Quantitative spect images were obtained after a 45-minute delay.   Attempted to walk patient on Bruce protocol but patient became very fatigued and SOB.  Had to stop and change to Low Level Lexiscan.  During the infusion the patient complained of SOB and lightheadedness.  This resolved in recovery.  Stress ECG: Significant ST abnormalities consistent with ischemia.  QPS Raw Data Images:  Patient motion noted. Stress Images:  Normal homogeneous uptake in all areas of the myocardium. Rest Images:  Normal homogeneous uptake in all areas of the myocardium. Subtraction (SDS):  No evidence of ischemia. Transient Ischemic Dilatation (Normal <1.22):  1.07 Lung/Heart Ratio (Normal <0.45):  0.33  Quantitative Gated Spect Images QGS EDV:  90 ml QGS ESV:  33 ml  Impression Exercise Capacity:  Lexiscan with low level exercise. BP Response:  Normal blood pressure response. Clinical Symptoms:  There is dyspnea. ECG Impression:  Significant ST abnormalities consistent with ischemia. Comparison with Prior Nuclear Study: No images to compare  Overall Impression:  Low risk stress nuclear study Normal perfusion images.  74mm St segment depression in inferior leads with lexiscan and low level exercise.  LV Ejection Fraction: 63%.  LV Wall Motion:  NL LV Function; NL Wall Motion  Anthony [name]

## 2013-09-23 ENCOUNTER — Encounter (HOSPITAL_COMMUNITY): Payer: Medicare Other

## 2013-12-14 ENCOUNTER — Ambulatory Visit (INDEPENDENT_AMBULATORY_CARE_PROVIDER_SITE_OTHER): Payer: Medicare Other | Admitting: Pulmonary Disease

## 2013-12-14 ENCOUNTER — Encounter: Payer: Self-pay | Admitting: Pulmonary Disease

## 2013-12-14 VITALS — BP 118/70 | HR 65 | Temp 97.7°F | Ht 72.0 in | Wt 220.4 lb

## 2013-12-14 DIAGNOSIS — J438 Other emphysema: Secondary | ICD-10-CM

## 2013-12-14 NOTE — Assessment & Plan Note (Signed)
The patient overall has remained stable since his last visit, but is having some issues with the heat and humidity. I would like to try him on a trial of stiolto, and he will let me know if he wishes to stay on this medication.  I have also stressed to him the importance of weight loss and conditioning.

## 2013-12-14 NOTE — Patient Instructions (Signed)
Hold spiriva, and try stiolto 2 inhalations each am for next 2 weeks as a trial.  Let me know if you wish to change to this for your maintenance medication Work on weight loss and exercise program. followup with me again in 75mos

## 2013-12-14 NOTE — Progress Notes (Signed)
   Subjective:    Patient ID: Anthony [name], male    DOB: 01-20-1941, 73 y.o.   MRN: [id number]  HPI Patient comes in today for followup of his known COPD. He is staying on his medications compliantly, and has not had an acute exacerbation or pulmonary infection since last visit. He is trying to get back to his exercise program after having an appendectomy, but it has been slow going. He denies any chest congestion or cough with purulence. He does feel like he humidity has made a little more difficult to breathe of late.   Review of Systems  Constitutional: Negative for fever and unexpected weight change.  HENT: Negative for congestion, dental problem, ear pain, nosebleeds, postnasal drip, rhinorrhea, sinus pressure, sneezing, sore throat and trouble swallowing.   Eyes: Negative for redness and itching.  Respiratory: Positive for shortness of breath and wheezing. Negative for cough and chest tightness.   Cardiovascular: Negative for palpitations and leg swelling.  Gastrointestinal: Negative for nausea and vomiting.  Genitourinary: Negative for dysuria.  Musculoskeletal: Negative for joint swelling.  Skin: Negative for rash.  Neurological: Negative for headaches.  Hematological: Does not bruise/bleed easily.  Psychiatric/Behavioral: Negative for dysphoric mood. The patient is not nervous/anxious.        Objective:   Physical Exam Overweight male in no acute distress Nose without purulence or discharge noted Neck without lymphadenopathy or thyromegaly Chest with mildly decreased breath sounds, no active wheezing Cardiac exam with regular rate and rhythm Lower extremities with mild ankle edema, no cyanosis Alert and oriented, moves all 4 extremities.       Assessment & Plan:

## 2014-01-01 ENCOUNTER — Encounter: Payer: Self-pay | Admitting: Family

## 2014-01-01 ENCOUNTER — Other Ambulatory Visit: Payer: Medicare Other | Admitting: Lab

## 2014-01-01 ENCOUNTER — Ambulatory Visit: Payer: Medicare Other

## 2014-01-01 ENCOUNTER — Encounter: Payer: Self-pay | Admitting: Hematology & Oncology

## 2014-01-01 ENCOUNTER — Ambulatory Visit (HOSPITAL_BASED_OUTPATIENT_CLINIC_OR_DEPARTMENT_OTHER): Payer: Medicare Other | Admitting: Family

## 2014-01-01 VITALS — BP 127/61 | HR 61 | Temp 97.9°F | Resp 18 | Ht 70.0 in | Wt 216.0 lb

## 2014-01-01 DIAGNOSIS — D539 Nutritional anemia, unspecified: Secondary | ICD-10-CM

## 2014-01-01 DIAGNOSIS — D72819 Decreased white blood cell count, unspecified: Secondary | ICD-10-CM

## 2014-01-01 DIAGNOSIS — D649 Anemia, unspecified: Secondary | ICD-10-CM | POA: Insufficient documentation

## 2014-01-01 LAB — CBC WITH DIFFERENTIAL (CANCER CENTER ONLY)
BASO#: 0 10*3/uL (ref 0.0–0.2)
BASO%: 0.7 % (ref 0.0–2.0)
EOS%: 2.6 % (ref 0.0–7.0)
Eosinophils Absolute: 0.1 10*3/uL (ref 0.0–0.5)
HEMATOCRIT: 33 % — AB (ref 38.7–49.9)
HGB: 11 g/dL — ABNORMAL LOW (ref 13.0–17.1)
LYMPH#: 1 10*3/uL (ref 0.9–3.3)
LYMPH%: 35.1 % (ref 14.0–48.0)
MCH: 37.9 pg — AB (ref 28.0–33.4)
MCHC: 33.3 g/dL (ref 32.0–35.9)
MCV: 114 fL — ABNORMAL HIGH (ref 82–98)
MONO#: 0.7 10*3/uL (ref 0.1–0.9)
MONO%: 24 % — ABNORMAL HIGH (ref 0.0–13.0)
NEUT#: 1 10*3/uL — ABNORMAL LOW (ref 1.5–6.5)
NEUT%: 37.6 % — AB (ref 40.0–80.0)
Platelets: 233 10*3/uL (ref 145–400)
RBC: 2.9 10*6/uL — ABNORMAL LOW (ref 4.20–5.70)
RDW: 13.8 % (ref 11.1–15.7)
WBC: 2.7 10*3/uL — ABNORMAL LOW (ref 4.0–10.0)

## 2014-01-01 LAB — IRON AND TIBC CHCC
%SAT: 38 % (ref 20–55)
Iron: 139 ug/dL (ref 42–163)
TIBC: 364 ug/dL (ref 202–409)
UIBC: 225 ug/dL (ref 117–376)

## 2014-01-01 LAB — CHCC SATELLITE - SMEAR

## 2014-01-01 LAB — TECHNOLOGIST REVIEW CHCC SATELLITE

## 2014-01-01 LAB — FERRITIN CHCC: FERRITIN: 176 ng/mL (ref 22–316)

## 2014-01-01 NOTE — Progress Notes (Signed)
Hematology/Oncology Consultation   Name: Anthony [name]      MRN: [id number]    Location: Room/bed info not found  Date: 01/01/2014 Time:4:39 PM   REFERRING PHYSICIAN:  Dr. 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 When his PCP drew labs in June his Hct 36.0 MCV 112.2  Monocytes 21.2. He has been SOB with exacerbation but states that he does have emphysema. He is not a smoker. 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 He denies headaches, fever, chills, n/v, cough, rash, chest pain, palpitations, abdominal pain, constipation, diarrhea, problems urinating, blood in urine or stool. He denies swelling tenderness, numbness or tingling in his extremities. His appetite is good.   ROS: All other 10 point review of systems is negative except for those issues mentioned above.   PAST MEDICAL HISTORY:   Past Medical History  Diagnosis Date  . HTN (hypertension)   . Hyperlipidemia   . Coronary atherosclerosis of native coronary artery   . LPRD (laryngopharyngeal reflux disease)   . Depressive disorder    ALLERGIES: No Known Allergies    MEDICATIONS:  Current Outpatient Prescriptions on File Prior to Visit  Medication Sig Dispense Refill  . albuterol (VENTOLIN HFA) 108 (90 BASE) MCG/ACT inhaler Inhale 1-2 puffs into the lungs every 6 (six) hours as needed for wheezing or shortness of breath.      Marland Kitchen aspirin 81 MG tablet Take 81 mg by mouth daily.      Marland Kitchen BYSTOLIC 5 MG tablet Take 5 mg by mouth daily.       . Cholecalciferol (VITAMIN D3) 2000 UNITS TABS Take 2,000 Units by mouth daily.      . clopidogrel (PLAVIX) 75 MG tablet Take 75 mg by mouth daily.       Marland Kitchen co-enzyme Q-10 30 MG capsule Take 100 mg by mouth daily.      . Ferrous Sulfate  (IRON) 325 (65 FE) MG TABS Take 1 tablet by mouth daily.      . fish oil-omega-3 fatty acids 1000 MG capsule Take 1 g by mouth daily.      . Flaxseed, Linseed, (FLAX SEED OIL) 1000 MG CAPS Take 1,000 mg by mouth daily.      Marland Kitchen losartan (COZAAR) 50 MG tablet Take 50 mg by mouth daily.       . meclizine (ANTIVERT) 25 MG tablet Take 25 mg by mouth 3 (three) times daily as needed.      . pantoprazole (PROTONIX) 40 MG tablet Take 40 mg by mouth daily.       . ranitidine (ZANTAC) 300 MG tablet Take 300 mg by mouth at bedtime.       . sertraline (ZOLOFT) 100 MG tablet Take 50 mg by mouth daily.       . simvastatin (ZOCOR) 40 MG tablet Take 40 mg by mouth every other day.       . tiotropium (SPIRIVA) 18 MCG inhalation capsule Place 18 mcg into inhaler and inhale daily.      . vitamin B-12 (CYANOCOBALAMIN) 1000 MCG tablet Take 1,000 mcg by mouth daily.       No current facility-administered medications on file prior to visit.   PAST SURGICAL HISTORY Past Surgical  History  Procedure Laterality Date  . Angioplasty    . Tonsillectomy      as a child   FAMILY HISTORY: Family History  Problem Relation Age of Onset  . Heart disease Father   . Hypertension Father   . Heart disease Mother    SOCIAL HISTORY:  reports that he quit smoking about 15 years ago. His smoking use included Cigarettes. He started smoking about 54 years ago. He has a 80 pack-year smoking history. He has never used smokeless tobacco. He reports that he drinks alcohol. He reports that he does not use illicit drugs.  PERFORMANCE STATUS: The patient's performance status is 0 - Asymptomatic  PHYSICAL EXAM: Most Recent Vital Signs: Blood pressure 127/61, pulse 61, temperature 97.9 F (36.6 C), temperature source Oral, resp. rate 18, height _0  (1.778 m), weight 216 lb (97.977 kg). BP 127/61  Pulse 61  Temp(Src) 97.9 F (36.6 C) (Oral)  Resp 18  Ht _1  (1.778 m)  Wt 216 lb (97.977 kg)  BMI 30.99 kg/m2  General  Appearance:    Alert, cooperative, no distress, appears stated age  Head:    Normocephalic, without obvious abnormality, atraumatic           Throat:   Lips, mucosa, and tongue normal; teeth and gums normal  Neck:   Supple, symmetrical, trachea midline, no adenopathy;       thyroid:  No enlargement/tenderness/nodules; no carotid   bruit or JVD  Back:     Symmetric, no curvature, ROM normal, no CVA tenderness  Lungs:     Clear to auscultation bilaterally, respirations unlabored  Chest wall:    No tenderness or deformity  Heart:    Regular rate and rhythm, S1 and S2 normal, no murmur, rub   or gallop  Abdomen:     Soft, non-tender, bowel sounds active all four quadrants,    no masses, no organomegaly        Extremities:   Extremities normal, atraumatic, no cyanosis or edema  Pulses:   2+ and symmetric all extremities  Skin:   Skin color, texture, turgor normal, no rashes or lesions  Lymph nodes:   Cervical, supraclavicular, and axillary nodes normal  Neurologic:   CNII-XII intact. Normal strength, sensation and reflexes      throughout   LABORATORY DATA:  Results for orders placed in visit on 01/01/14 (from the past 48 hour(s))  RETICULOCYTE COUNT (SLN)     Status: Abnormal (Preliminary result)   Collection Time    01/01/14 10:47 AM      Result Value Ref Range   Retic Ct Pct 2.1  0.4 - 2.3 %   RBC. 2.98 (*) 4.22 - 5.81 MIL/uL   ABS Retic 62.6  19.0 - 186.0 K/uL  TECHNOLOGIST REVIEW [facility]     Status: None   Collection Time    01/01/14 10:48 AM      Result Value Ref Range   Tech Review       Value: Large platelets, Occ metamyelocyte, Few variant lymphs, Sl polychromasia, Few teardrop and ovalocytes     RADIOGRAPHY: No results found.     PATHOLOGY:  None  ASSESSMENT/PLAN: Anthony [name] is a very pleasant 73 yo male referred to [place] with possible macrocytic anemia and leukopenia. His CBC and smear were suspicious for myelodysplasia. We will need a bone marrow biopsy to  confirm this. We discussed this with the patient in detail and he is in agreement.  We scheduled him  for bone marrow biopsy on August 4th at 8:30.   We will see him back 2 weeks after the biopsy for labs and a follow-up. All questions were answered. The patient knows to call the clinic with any problems, questions or concerns. We can certainly see the patient much sooner if necessary.  The patient and plan were discussed with and the patient was also seen by Dr. Marin Olp and he is in agreement with the aforementioned.   [facility] M NP  Addendum by Dr. Marin Olp as follows:  I saw and examined the patient. I did get his blood smear and the microscope. I do not see any leukemic cells. He did have an increase in monocytes. He had no nucleated red cells. There was no rouleau formation. He had no target cells.  There were no atypical lymphocytes. Again no immature myeloid cells. There were no hyper segmented polys. The platelets looked adequate. The platelets were well granulated.  On his exam, there is no liver or spleen tip. There were no palpable lymph glands. She had no rashes.  I really have to believe that he will end up having myelodysplastic syndrome. Given the increase in monocytes, given the macrocytic index and a given a low white cells, I believe that he has a dysfunctional marrow. The only way for [place] to find this out will be through a bone marrow biopsy.  I told him about this. I explained how we do we'll do this. We can do this in the office. I don't think he will need sedation. We did send off a specimen for flow cytometry and cytogenetics.  We will see what the rest of his lab work looks like. I think iron studies will be important. I think an erythropoietin level will be important.  I spent about 40 minutes with him. Judson Roch was with [place].  We will set the bone marrow biopsy up for August 4. I'll see him back about 2 weeks after the biopsy is done. We will have all of the results  by then.

## 2014-01-05 LAB — COMPREHENSIVE METABOLIC PANEL
ALK PHOS: 28 U/L — AB (ref 39–117)
ALT: 17 U/L (ref 0–53)
AST: 22 U/L (ref 0–37)
Albumin: 4.3 g/dL (ref 3.5–5.2)
BILIRUBIN TOTAL: 0.4 mg/dL (ref 0.2–1.2)
BUN: 20 mg/dL (ref 6–23)
CO2: 27 mEq/L (ref 19–32)
CREATININE: 1.54 mg/dL — AB (ref 0.50–1.35)
Calcium: 9.4 mg/dL (ref 8.4–10.5)
Chloride: 98 mEq/L (ref 96–112)
Glucose, Bld: 80 mg/dL (ref 70–99)
Potassium: 4.7 mEq/L (ref 3.5–5.3)
Sodium: 141 mEq/L (ref 135–145)
TOTAL PROTEIN: 7.3 g/dL (ref 6.0–8.3)

## 2014-01-05 LAB — PROTEIN ELECTROPHORESIS, SERUM
ALBUMIN ELP: 55.1 % — AB (ref 55.8–66.1)
ALPHA-1-GLOBULIN: 6.9 % — AB (ref 2.9–4.9)
ALPHA-2-GLOBULIN: 8.9 % (ref 7.1–11.8)
BETA 2: 6.7 % — AB (ref 3.2–6.5)
BETA GLOBULIN: 7.9 % — AB (ref 4.7–7.2)
GAMMA GLOBULIN: 14.5 % (ref 11.1–18.8)
Total Protein, Serum Electrophoresis: 7.3 g/dL (ref 6.0–8.3)

## 2014-01-05 LAB — VITAMIN B12: Vitamin B-12: 1095 pg/mL — ABNORMAL HIGH (ref 211–911)

## 2014-01-05 LAB — RETICULOCYTES (CHCC)
ABS Retic: 62.6 10*3/uL (ref 19.0–186.0)
RBC.: 2.98 MIL/uL — ABNORMAL LOW (ref 4.22–5.81)
RETIC CT PCT: 2.1 % (ref 0.4–2.3)

## 2014-01-05 LAB — LACTATE DEHYDROGENASE: LDH: 175 U/L (ref 94–250)

## 2014-01-05 LAB — ERYTHROPOIETIN: ERYTHROPOIETIN: 38 m[IU]/mL — AB (ref 2.6–18.5)

## 2014-01-12 ENCOUNTER — Other Ambulatory Visit (HOSPITAL_COMMUNITY)
Admission: RE | Admit: 2014-01-12 | Discharge: 2014-01-12 | Disposition: A | Discharge status: Home / Self Care | Payer: Medicare Other | Source: Ambulatory Visit | Attending: Hematology & Oncology | Admitting: Hematology & Oncology

## 2014-01-12 ENCOUNTER — Ambulatory Visit (HOSPITAL_BASED_OUTPATIENT_CLINIC_OR_DEPARTMENT_OTHER): Payer: Medicare Other | Admitting: Hematology & Oncology

## 2014-01-12 ENCOUNTER — Other Ambulatory Visit: Payer: Medicare Other | Admitting: Lab

## 2014-01-12 DIAGNOSIS — D509 Iron deficiency anemia, unspecified: Secondary | ICD-10-CM

## 2014-01-12 DIAGNOSIS — D72819 Decreased white blood cell count, unspecified: Secondary | ICD-10-CM | POA: Insufficient documentation

## 2014-01-12 DIAGNOSIS — D4620 Refractory anemia with excess of blasts, unspecified: Secondary | ICD-10-CM

## 2014-01-12 DIAGNOSIS — D759 Disease of blood and blood-forming organs, unspecified: Secondary | ICD-10-CM | POA: Diagnosis not present

## 2014-01-12 DIAGNOSIS — D539 Nutritional anemia, unspecified: Secondary | ICD-10-CM | POA: Insufficient documentation

## 2014-01-12 DIAGNOSIS — D649 Anemia, unspecified: Secondary | ICD-10-CM

## 2014-01-12 LAB — BONE MARROW EXAM

## 2014-01-12 LAB — CBC
HCT: 32.5 % — ABNORMAL LOW (ref 39.0–52.0)
HEMOGLOBIN: 10.6 g/dL — AB (ref 13.0–17.0)
MCH: 37.1 pg — AB (ref 26.0–34.0)
MCHC: 32.6 g/dL (ref 30.0–36.0)
MCV: 113.6 fL — ABNORMAL HIGH (ref 78.0–100.0)
PLATELETS: 217 10*3/uL (ref 150–400)
RBC: 2.86 MIL/uL — AB (ref 4.22–5.81)
RDW: 14.7 % (ref 11.5–15.5)
WBC: 3.5 10*3/uL — ABNORMAL LOW (ref 4.0–10.5)

## 2014-01-12 NOTE — Progress Notes (Signed)
This is a bone marrow biopsy procedure note for [name].  He was brought to the treatment room at the cancer center. He signed the consent form. We did the appropriate time out procedure.  We've placement to his right side. The left posterior iliac crest region was prepped and draped in sterile fashion. 5 cc of 1% lidocaine was injected under the skin and down to the periosteum. A #11 scalpel was used to make an incision to skin.  With the aspirate needle, we obtained 2 aspirates.  I then used a Jamshidi biopsy needle. I obtained a decent bone marrow biopsy core.  He tolerated the procedure well. We've cleaned and dressed the procedure site.  There were no complications.  Laurey Arrow

## 2014-01-13 ENCOUNTER — Encounter: Payer: Self-pay | Admitting: Hematology & Oncology

## 2014-01-15 ENCOUNTER — Encounter: Payer: Self-pay | Admitting: Hematology & Oncology

## 2014-01-15 DIAGNOSIS — D46Z Other myelodysplastic syndromes: Secondary | ICD-10-CM | POA: Insufficient documentation

## 2014-01-15 DIAGNOSIS — D509 Iron deficiency anemia, unspecified: Secondary | ICD-10-CM | POA: Insufficient documentation

## 2014-01-15 DIAGNOSIS — D4620 Refractory anemia with excess of blasts, unspecified: Secondary | ICD-10-CM

## 2014-01-15 HISTORY — DX: Iron deficiency anemia, unspecified: D50.9

## 2014-01-15 HISTORY — DX: Refractory anemia with excess of blasts, unspecified: D46.20

## 2014-01-15 HISTORY — DX: Other myelodysplastic syndromes: D46.Z

## 2014-01-20 ENCOUNTER — Encounter: Payer: Self-pay | Admitting: Hematology & Oncology

## 2014-01-20 LAB — TISSUE HYBRIDIZATION (BONE MARROW)-NCBH

## 2014-01-20 LAB — CHROMOSOME ANALYSIS, BONE MARROW

## 2014-01-21 ENCOUNTER — Ambulatory Visit (HOSPITAL_BASED_OUTPATIENT_CLINIC_OR_DEPARTMENT_OTHER): Payer: Medicare Other

## 2014-01-21 VITALS — BP 115/68 | HR 66 | Resp 12

## 2014-01-21 DIAGNOSIS — D4620 Refractory anemia with excess of blasts, unspecified: Secondary | ICD-10-CM

## 2014-01-21 MED ORDER — DARBEPOETIN ALFA-POLYSORBATE 300 MCG/0.6ML IJ SOLN
INTRAMUSCULAR | Status: AC
  Filled 2014-01-21: qty 0.6

## 2014-01-21 MED ORDER — DARBEPOETIN ALFA-POLYSORBATE 300 MCG/0.6ML IJ SOLN: 300.0000 ug | Freq: Once | INTRAMUSCULAR | Status: DC

## 2014-01-21 MED ORDER — SODIUM CHLORIDE 0.9 % IV SOLN
1020.0000 mg | Freq: Once | INTRAVENOUS | Status: AC
  Administered 2014-01-21: 1020 mg via INTRAVENOUS
  Filled 2014-01-21: qty 34

## 2014-01-21 NOTE — Patient Instructions (Signed)
Ferumoxytol injection What is this medicine? FERUMOXYTOL is an iron complex. Iron is used to make healthy red blood cells, which carry oxygen and nutrients throughout the body. This medicine is used to treat iron deficiency anemia in people with chronic kidney disease. This medicine may be used for other purposes; ask your health care provider or pharmacist if you have questions. COMMON BRAND NAME(S): Feraheme What should I tell my health care provider before I take this medicine? They need to know if you have any of these conditions: -anemia not caused by low iron levels -high levels of iron in the blood -magnetic resonance imaging (MRI) test scheduled -an unusual or allergic reaction to iron, other medicines, foods, dyes, or preservatives -pregnant or trying to get pregnant -breast-feeding How should I use this medicine? This medicine is for injection into a vein. It is given by a health care professional in a hospital or clinic setting. Talk to your pediatrician regarding the use of this medicine in children. Special care may be needed. Overdosage: If you think you've taken too much of this medicine contact a poison control center or emergency room at once. Overdosage: If you think you have taken too much of this medicine contact a poison control center or emergency room at once. NOTE: This medicine is only for you. Do not share this medicine with others. What if I miss a dose? It is important not to miss your dose. Call your doctor or health care professional if you are unable to keep an appointment. What may interact with this medicine? This medicine may interact with the following medications: -other iron products This list may not describe all possible interactions. Give your health care provider a list of all the medicines, herbs, non-prescription drugs, or dietary supplements you use. Also tell them if you smoke, drink alcohol, or use illegal drugs. Some items may interact with your  medicine. What should I watch for while using this medicine? Visit your doctor or healthcare professional regularly. Tell your doctor or healthcare professional if your symptoms do not start to get better or if they get worse. You may need blood work done while you are taking this medicine. You may need to follow a special diet. Talk to your doctor. Foods that contain iron include: whole grains/cereals, dried fruits, beans, or peas, leafy green vegetables, and organ meats (liver, kidney). What side effects may I notice from receiving this medicine? Side effects that you should report to your doctor or health care professional as soon as possible: -allergic reactions like skin rash, itching or hives, swelling of the face, lips, or tongue -breathing problems -changes in blood pressure -feeling faint or lightheaded, falls -fever or chills -flushing, sweating, or hot feelings -swelling of the ankles or feet Side effects that usually do not require medical attention (Report these to your doctor or health care professional if they continue or are bothersome.): -diarrhea -headache -nausea, vomiting -stomach pain This list may not describe all possible side effects. Call your doctor for medical advice about side effects. You may report side effects to FDA at 1-800-FDA-1088. Where should I keep my medicine? This drug is given in a hospital or clinic and will not be stored at home. NOTE: This sheet is a summary. It may not cover all possible information. If you have questions about this medicine, talk to your doctor, pharmacist, or health care provider.  2015, Elsevier/Gold Standard. (2012-01-11 15:23:36)   Darbepoetin Alfa injection What is this medicine? DARBEPOETIN ALFA (dar be POE   e tin AL fa) helps your body make more red blood cells. It is used to treat anemia caused by chronic kidney failure and chemotherapy. This medicine may be used for other purposes; ask your health care provider or  pharmacist if you have questions. COMMON BRAND NAME(S): Aranesp What should I tell my health care provider before I take this medicine? They need to know if you have any of these conditions: -blood clotting disorders or history of blood clots -cancer patient not on chemotherapy -cystic fibrosis -heart disease, such as angina, heart failure, or a history of a heart attack -hemoglobin level of 12 g/dL or greater -high blood pressure -low levels of folate, iron, or vitamin B12 -seizures -an unusual or allergic reaction to darbepoetin, erythropoietin, albumin, hamster proteins, latex, other medicines, foods, dyes, or preservatives -pregnant or trying to get pregnant -breast-feeding How should I use this medicine? This medicine is for injection into a vein or under the skin. It is usually given by a health care professional in a hospital or clinic setting. If you get this medicine at home, you will be taught how to prepare and give this medicine. Do not shake the solution before you withdraw a dose. Use exactly as directed. Take your medicine at regular intervals. Do not take your medicine more often than directed. It is important that you put your used needles and syringes in a special sharps container. Do not put them in a trash can. If you do not have a sharps container, call your pharmacist or healthcare provider to get one. Talk to your pediatrician regarding the use of this medicine in children. While this medicine may be used in children as young as 1 year for selected conditions, precautions do apply. Overdosage: If you think you have taken too much of this medicine contact a poison control center or emergency room at once. NOTE: This medicine is only for you. Do not share this medicine with others. What if I miss a dose? If you miss a dose, take it as soon as you can. If it is almost time for your next dose, take only that dose. Do not take double or extra doses. What may interact with  this medicine? Do not take this medicine with any of the following medications: -epoetin alfa This list may not describe all possible interactions. Give your health care provider a list of all the medicines, herbs, non-prescription drugs, or dietary supplements you use. Also tell them if you smoke, drink alcohol, or use illegal drugs. Some items may interact with your medicine. What should I watch for while using this medicine? Visit your prescriber or health care professional for regular checks on your progress and for the needed blood tests and blood pressure measurements. It is especially important for the doctor to make sure your hemoglobin level is in the desired range, to limit the risk of potential side effects and to give you the best benefit. Keep all appointments for any recommended tests. Check your blood pressure as directed. Ask your doctor what your blood pressure should be and when you should contact him or her. As your body makes more red blood cells, you may need to take iron, folic acid, or vitamin B supplements. Ask your doctor or health care provider which products are right for you. If you have kidney disease continue dietary restrictions, even though this medication can make you feel better. Talk with your doctor or health care professional about the foods you eat and the vitamins that   you take. What side effects may I notice from receiving this medicine? Side effects that you should report to your doctor or health care professional as soon as possible: -allergic reactions like skin rash, itching or hives, swelling of the face, lips, or tongue -breathing problems -changes in vision -chest pain -confusion, trouble speaking or understanding -feeling faint or lightheaded, falls -high blood pressure -muscle aches or pains -pain, swelling, warmth in the leg -rapid weight gain -severe headaches -sudden numbness or weakness of the face, arm or leg -trouble walking, dizziness, loss  of balance or coordination -seizures (convulsions) -swelling of the ankles, feet, hands -unusually weak or tired Side effects that usually do not require medical attention (report to your doctor or health care professional if they continue or are bothersome): -diarrhea -fever, chills (flu-like symptoms) -headaches -nausea, vomiting -redness, stinging, or swelling at site where injected This list may not describe all possible side effects. Call your doctor for medical advice about side effects. You may report side effects to FDA at 1-800-FDA-1088. Where should I keep my medicine? Keep out of the reach of children. Store in a refrigerator between 2 and 8 degrees C (36 and 46 degrees F). Do not freeze. Do not shake. Throw away any unused portion if using a single-dose vial. Throw away any unused medicine after the expiration date. NOTE: This sheet is a summary. It may not cover all possible information. If you have questions about this medicine, talk to your doctor, pharmacist, or health care provider.  2015, [employer]vier/[employer] Standard. (2008-05-11 10:23:57)  

## 2014-01-26 ENCOUNTER — Other Ambulatory Visit: Payer: Medicare Other | Admitting: Lab

## 2014-01-26 ENCOUNTER — Ambulatory Visit: Payer: Medicare Other | Admitting: Family

## 2014-02-12 ENCOUNTER — Ambulatory Visit (HOSPITAL_BASED_OUTPATIENT_CLINIC_OR_DEPARTMENT_OTHER): Payer: Medicare Other | Admitting: Family

## 2014-02-12 ENCOUNTER — Other Ambulatory Visit: Payer: Self-pay | Admitting: *Deleted

## 2014-02-12 ENCOUNTER — Telehealth: Payer: Self-pay | Admitting: *Deleted

## 2014-02-12 ENCOUNTER — Other Ambulatory Visit (HOSPITAL_BASED_OUTPATIENT_CLINIC_OR_DEPARTMENT_OTHER): Payer: Medicare Other | Admitting: Lab

## 2014-02-12 ENCOUNTER — Encounter: Payer: Self-pay | Admitting: Family

## 2014-02-12 VITALS — BP 118/63 | HR 69 | Temp 97.8°F | Resp 18 | Ht 69.0 in | Wt 216.0 lb

## 2014-02-12 DIAGNOSIS — D4620 Refractory anemia with excess of blasts, unspecified: Secondary | ICD-10-CM

## 2014-02-12 DIAGNOSIS — D72819 Decreased white blood cell count, unspecified: Secondary | ICD-10-CM

## 2014-02-12 DIAGNOSIS — D539 Nutritional anemia, unspecified: Secondary | ICD-10-CM

## 2014-02-12 LAB — CBC WITH DIFFERENTIAL (CANCER CENTER ONLY)
BASO#: 0 10*3/uL (ref 0.0–0.2)
BASO%: 0.8 % (ref 0.0–2.0)
EOS ABS: 0.1 10*3/uL (ref 0.0–0.5)
EOS%: 1.6 % (ref 0.0–7.0)
HCT: 33.1 % — ABNORMAL LOW (ref 38.7–49.9)
HEMOGLOBIN: 11.1 g/dL — AB (ref 13.0–17.1)
LYMPH#: 1.1 10*3/uL (ref 0.9–3.3)
LYMPH%: 29.9 % (ref 14.0–48.0)
MCH: 38.7 pg — ABNORMAL HIGH (ref 28.0–33.4)
MCHC: 33.5 g/dL (ref 32.0–35.9)
MCV: 115 fL — AB (ref 82–98)
MONO#: 0.9 10*3/uL (ref 0.1–0.9)
MONO%: 24.9 % — ABNORMAL HIGH (ref 0.0–13.0)
NEUT%: 42.8 % (ref 40.0–80.0)
NEUTROS ABS: 1.6 10*3/uL (ref 1.5–6.5)
Platelets: 244 10*3/uL (ref 145–400)
RBC: 2.87 10*6/uL — AB (ref 4.20–5.70)
RDW: 14 % (ref 11.1–15.7)
WBC: 3.7 10*3/uL — ABNORMAL LOW (ref 4.0–10.0)

## 2014-02-12 LAB — COMPREHENSIVE METABOLIC PANEL
ALBUMIN: 4.5 g/dL (ref 3.5–5.2)
ALT: 23 U/L (ref 0–53)
AST: 29 U/L (ref 0–37)
Alkaline Phosphatase: 39 U/L (ref 39–117)
BUN: 25 mg/dL — ABNORMAL HIGH (ref 6–23)
CALCIUM: 9.3 mg/dL (ref 8.4–10.5)
CO2: 27 meq/L (ref 19–32)
Chloride: 101 mEq/L (ref 96–112)
Creatinine, Ser: 1.72 mg/dL — ABNORMAL HIGH (ref 0.50–1.35)
GLUCOSE: 98 mg/dL (ref 70–99)
POTASSIUM: 5 meq/L (ref 3.5–5.3)
Sodium: 137 mEq/L (ref 135–145)
Total Bilirubin: 0.4 mg/dL (ref 0.2–1.2)
Total Protein: 7.6 g/dL (ref 6.0–8.3)

## 2014-02-12 LAB — TECHNOLOGIST REVIEW CHCC SATELLITE

## 2014-02-12 NOTE — Telephone Encounter (Signed)
Patient called stating that he is very fatigued and tired again.  Some SOB but has COPD.  Would like to be seen.  Spoke with Dr. Niel Hummer.  To put patient on Clarise Cruz schedule today if possible. Scheduler notified

## 2014-02-12 NOTE — Progress Notes (Signed)
[place]  [phone] [phone]  ID: Anthony [name] OB: 1941/05/27 MR#: [id number]#:[id number] Patient Care Team: Abigail Miyamoto, MD as PCP - General (Family Medicine)  DIAGNOSIS:  Macrocytic anemia and leukopenia  INTERVAL HISTORY: Anthony [name] is a very pleasant 73 yo male with macrocytic anemia and leukopenia. He is still feeling tired. He felt better for a week or so after his infusion but now he is getting fatigued again. His CBC today is unremarkable. He denies headaches, fever, chills, n/v, cough, rash, SOB, chest pain, palpitations, abdominal pain, constipation, diarrhea, problems urinating, blood in urine or stool. He denies swelling tenderness, numbness or tingling in his extremities. His appetite is good and he is drinking plenty of fluids. He last had iron in August.   CURRENT TREATMENT: Iron infusions as indicated  REVIEW OF SYSTEMS: All other 10 point review of systems is negative except for those issues mentioned above.  PAST MEDICAL HISTORY: Past Medical History  Diagnosis Date  . HTN (hypertension)   . Hyperlipidemia   . Coronary atherosclerosis of native coronary artery   . LPRD (laryngopharyngeal reflux disease)   . Depressive disorder   . 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 Iron deficiency anemia, unspecified 01/15/2014   PAST SURGICAL HISTORY: Past Surgical History  Procedure Laterality Date  . Angioplasty    . Tonsillectomy      as a child   FAMILY HISTORY Family History  Problem Relation Age of Onset  . Heart disease Father   . Hypertension Father   . Heart disease Mother    GYNECOLOGIC HISTORY:  No LMP for male patient.   SOCIAL HISTORY:  History   Social History  . Marital Status: Single    Spouse Name: N/A    Number of Children: 2  . Years of Education: N/A   Occupational History  . retired    Social History Main Topics  . Smoking status: Former Smoker -- 2.00 packs/day for 40 years    Types:  Cigarettes    Start date: 08/05/1959    Quit date: 06/11/1998  . Smokeless tobacco: Never Used     Comment: quit smoking 15 years ago  . Alcohol Use: Yes     Comment: 4 cocktails qd  . Drug Use: No  . Sexual Activity: Not on file   Other Topics Concern  . Not on file   Social History Narrative  . No narrative on file   ADVANCED DIRECTIVES: <no information>  HEALTH MAINTENANCE: History  Substance Use Topics  . Smoking status: Former Smoker -- 2.00 packs/day for 40 years    Types: Cigarettes    Start date: 08/05/1959    Quit date: 06/11/1998  . Smokeless tobacco: Never Used     Comment: quit smoking 15 years ago  . Alcohol Use: Yes     Comment: 4 cocktails qd   Colonoscopy: PAP: Bone density: Lipid panel:  No Known Allergies  Current Outpatient Prescriptions  Medication Sig Dispense Refill  . albuterol (VENTOLIN HFA) 108 (90 BASE) MCG/ACT inhaler Inhale 1-2 puffs into the lungs every 6 (six) hours as needed for wheezing or shortness of breath.      Marland Kitchen aspirin 81 MG tablet Take 81 mg by mouth daily.      Marland Kitchen BYSTOLIC 5 MG tablet Take 5 mg by mouth daily.       . Cholecalciferol (VITAMIN D3) 2000 UNITS TABS Take 2,000 Units by mouth daily.      . clopidogrel (  PLAVIX) 75 MG tablet Take 75 mg by mouth daily.       Marland Kitchen co-enzyme Q-10 30 MG capsule Take 100 mg by mouth daily.      . Ferrous Sulfate (IRON) 325 (65 FE) MG TABS Take 1 tablet by mouth daily.      . fish oil-omega-3 fatty acids 1000 MG capsule Take 1 g by mouth daily.      . Flaxseed, Linseed, (FLAX SEED OIL) 1000 MG CAPS Take 1,000 mg by mouth daily.      Marland Kitchen losartan (COZAAR) 50 MG tablet Take 50 mg by mouth daily.       . meclizine (ANTIVERT) 25 MG tablet Take 25 mg by mouth 3 (three) times daily as needed.      . pantoprazole (PROTONIX) 40 MG tablet Take 40 mg by mouth daily.       . ranitidine (ZANTAC) 300 MG tablet Take 300 mg by mouth at bedtime.       . sertraline (ZOLOFT) 100 MG tablet Take 50 mg by mouth  daily.       . simvastatin (ZOCOR) 40 MG tablet Take 40 mg by mouth every other day.       . tiotropium (SPIRIVA) 18 MCG inhalation capsule Place 18 mcg into inhaler and inhale daily.      . vitamin B-12 (CYANOCOBALAMIN) 1000 MCG tablet Take 1,000 mcg by mouth daily.       No current facility-administered medications for this visit.   OBJECTIVE: Filed Vitals:   02/12/14 1246  BP: 118/63  Pulse: 69  Temp: 97.8 F (36.6 C)  Resp: 18   Body mass index is 31.88 kg/(m^2). ECOG FS:1 - Symptomatic but completely ambulatory Ocular: Sclerae unicteric, pupils equal, round and reactive to light Ear-nose-throat: Oropharynx clear, dentition fair Lymphatic: No cervical or supraclavicular adenopathy Lungs no rales or rhonchi, good excursion bilaterally Heart regular rate and rhythm, no murmur appreciated Abd soft, nontender, positive bowel sounds MSK no focal spinal tenderness, no joint edema Neuro: non-focal, well-oriented, appropriate affect  LAB RESULTS: CMP     Component Value Date/Time   NA 141 01/01/2014 1047   K 4.7 01/01/2014 1047   CL 98 01/01/2014 1047   CO2 27 01/01/2014 1047   GLUCOSE 80 01/01/2014 1047   BUN 20 01/01/2014 1047   CREATININE 1.54* 01/01/2014 1047   CALCIUM 9.4 01/01/2014 1047   PROT 7.3 01/01/2014 1047   ALBUMIN 4.3 01/01/2014 1047   AST 22 01/01/2014 1047   ALT 17 01/01/2014 1047   ALKPHOS 28* 01/01/2014 1047   BILITOT 0.4 01/01/2014 1047   No results found for this basename: SPEP, UPEP,  kappa and lambda light chains   Lab Results  Component Value Date   WBC 3.7* 02/12/2014   NEUTROABS 1.6 02/12/2014   HGB 11.1* 02/12/2014   HCT 33.1* 02/12/2014   MCV 115* 02/12/2014   PLT 244 02/12/2014   No results found for this basename: LABCA2   No components found with this basename: LABCA125   No results found for this basename: INR,  in the last 168 hours Urinalysis No results found for this basename: colorurine, appearanceur, labspec, phurine, glucoseu, hgbur, bilirubinur,  ketonesur, proteinur, urobilinogen, nitrite, leukocytesur   STUDIES: No results found.  ASSESSMENT/PLAN: Anthony [name] is a very pleasant 73 yo male referred to [place] with  macrocytic anemia and leukopenia.  He is feeling tired. He had iron 01/21/14. His CBC looked good. We will have to wait and see what his iron  studies show. We will see him back in 1 month for labs and a follow-up.  All questions were answered. The patient knows to call the clinic with any problems, questions or concerns. We can certainly see the patient much sooner if necessary.  Eliezer Bottom, NP 02/12/2014 1:43 PM

## 2014-02-16 LAB — IRON AND TIBC CHCC
%SAT: 48 % (ref 20–55)
IRON: 168 ug/dL — AB (ref 42–163)
TIBC: 346 ug/dL (ref 202–409)
UIBC: 179 ug/dL (ref 117–376)

## 2014-02-16 LAB — FERRITIN CHCC: Ferritin: 1279 ng/ml — ABNORMAL HIGH (ref 22–316)

## 2014-02-17 ENCOUNTER — Telehealth: Payer: Self-pay | Admitting: *Deleted

## 2014-02-17 ENCOUNTER — Ambulatory Visit: Payer: Medicare Other | Admitting: Family

## 2014-02-17 ENCOUNTER — Other Ambulatory Visit: Payer: Medicare Other | Admitting: Lab

## 2014-02-17 NOTE — Telephone Encounter (Addendum)
Message copied by Lenn Sink on Wed Feb 17, 2014 11:27 AM ------      Message from: Volanda Napoleon      Created: Tue Feb 16, 2014  9:06 PM       Call - iron is ok.  pete ------Left voicemail informing pt that iron is okay.

## 2014-02-19 ENCOUNTER — Telehealth: Payer: Self-pay | Admitting: Hematology & Oncology

## 2014-02-19 NOTE — Telephone Encounter (Signed)
Phone has weird ring no answer, voice mail or other number. Mailed 9-30 schedule

## 2014-02-24 ENCOUNTER — Ambulatory Visit: Payer: Medicare Other | Admitting: Family

## 2014-02-24 ENCOUNTER — Other Ambulatory Visit: Payer: Medicare Other | Admitting: Lab

## 2014-03-01 ENCOUNTER — Ambulatory Visit: Payer: Medicare Other | Admitting: Family

## 2014-03-01 ENCOUNTER — Other Ambulatory Visit: Payer: Medicare Other | Admitting: Lab

## 2014-03-02 ENCOUNTER — Telehealth: Payer: Self-pay | Admitting: Interventional Cardiology

## 2014-03-02 NOTE — Telephone Encounter (Signed)
Follow up:   Pt returned call.  Please call pt back after 3 pm he is going to take a nap.

## 2014-03-02 NOTE — Telephone Encounter (Signed)
New problem:   Per pt when he gets up he feels as though he is going to pass out and would like an EKG.   This has has been going on for a month.  Per pt this is the same way he felt before his two Stints.    Pt would like a call back on what he should do.

## 2014-03-02 NOTE — Telephone Encounter (Signed)
returned pt call.pt sts that he has had dizziness/lightheadines when he goes from sitting to standing x1 month.pt denies syncope, pt denies chest pain,or edema.pt  pt has exertional dyspnea but also has Emphysema.pt sts that this is similar to when he had his stent placement 12 years ago. Pt had a myoview in April 2015, that was low risk.adv pt I will send a message to Dr.Smith and call back with his recommendations. Pt agreeable and verbalized understanding.

## 2014-03-02 NOTE — Telephone Encounter (Signed)
returned pt call. lmtcb 

## 2014-03-04 NOTE — Telephone Encounter (Signed)
I don't believe this is angina/ischemia/blood flow related. Perhaps the BP drops with standing.If continued trouble will need to be seen.

## 2014-03-05 NOTE — Telephone Encounter (Signed)
called pt to give Dr.Smith recommendations.pt adv Dr.Smith  doesn't believe this is angina/ischemia/blood flow related. Perhaps the BP drops with standing. pt sts that he also gets a " weird felling in his head" pt also complains of nausea. pt adv to sch a f/u with his pcp,and to call the office if cardiac symptoms develop. Pt agreeable and verbalized understandig

## 2014-03-10 ENCOUNTER — Ambulatory Visit (HOSPITAL_BASED_OUTPATIENT_CLINIC_OR_DEPARTMENT_OTHER): Payer: Medicare Other | Admitting: Hematology & Oncology

## 2014-03-10 ENCOUNTER — Encounter: Payer: Self-pay | Admitting: Hematology & Oncology

## 2014-03-10 ENCOUNTER — Ambulatory Visit (HOSPITAL_BASED_OUTPATIENT_CLINIC_OR_DEPARTMENT_OTHER): Payer: Medicare Other

## 2014-03-10 ENCOUNTER — Other Ambulatory Visit (HOSPITAL_BASED_OUTPATIENT_CLINIC_OR_DEPARTMENT_OTHER): Payer: Medicare Other | Admitting: Lab

## 2014-03-10 VITALS — BP 122/57 | HR 62 | Temp 97.6°F | Resp 18 | Ht 69.0 in | Wt 219.0 lb

## 2014-03-10 DIAGNOSIS — D4620 Refractory anemia with excess of blasts, unspecified: Secondary | ICD-10-CM

## 2014-03-10 DIAGNOSIS — D649 Anemia, unspecified: Secondary | ICD-10-CM

## 2014-03-10 DIAGNOSIS — D509 Iron deficiency anemia, unspecified: Secondary | ICD-10-CM

## 2014-03-10 LAB — CBC WITH DIFFERENTIAL (CANCER CENTER ONLY)
BASO#: 0 10*3/uL (ref 0.0–0.2)
BASO%: 0.5 % (ref 0.0–2.0)
EOS%: 3 % (ref 0.0–7.0)
Eosinophils Absolute: 0.1 10*3/uL (ref 0.0–0.5)
HEMATOCRIT: 32.4 % — AB (ref 38.7–49.9)
HEMOGLOBIN: 10.8 g/dL — AB (ref 13.0–17.1)
LYMPH#: 1.2 10*3/uL (ref 0.9–3.3)
LYMPH%: 33.4 % (ref 14.0–48.0)
MCH: 38.8 pg — ABNORMAL HIGH (ref 28.0–33.4)
MCHC: 33.3 g/dL (ref 32.0–35.9)
MCV: 117 fL — AB (ref 82–98)
MONO#: 1 10*3/uL — ABNORMAL HIGH (ref 0.1–0.9)
MONO%: 26.7 % — ABNORMAL HIGH (ref 0.0–13.0)
NEUT#: 1.4 10*3/uL — ABNORMAL LOW (ref 1.5–6.5)
NEUT%: 36.4 % — AB (ref 40.0–80.0)
Platelets: 255 10*3/uL (ref 145–400)
RBC: 2.78 10*6/uL — ABNORMAL LOW (ref 4.20–5.70)
RDW: 14.1 % (ref 11.1–15.7)
WBC: 3.7 10*3/uL — ABNORMAL LOW (ref 4.0–10.0)

## 2014-03-10 LAB — TECHNOLOGIST REVIEW CHCC SATELLITE

## 2014-03-10 MED ORDER — DARBEPOETIN ALFA-POLYSORBATE 200 MCG/0.4ML IJ SOLN
200.0000 ug | Freq: Once | INTRAMUSCULAR | Status: AC
  Administered 2014-03-10: 200 ug via SUBCUTANEOUS

## 2014-03-10 MED ORDER — DARBEPOETIN ALFA-POLYSORBATE 200 MCG/0.4ML IJ SOLN
INTRAMUSCULAR | Status: AC
  Filled 2014-03-10: qty 0.4

## 2014-03-10 NOTE — Progress Notes (Signed)
Hematology and Oncology Follow Up Visit  Anthony [name] [id number] 1941/05/16 73 y.o. 2020-02-2514   Principle Diagnosis:   Low-risk myelodysplasia  Current Therapy:    Aranesp 200-300 mcg subcutaneous as needed for hemoglobin less than 11  IV iron as indicated     Interim History:  Mr.  [name] is back for followup. He does feel a little tired. When we first saw him, he was a little iron deficient. We gave him some IV iron.  He said he felt a little better with that.  Currently, his problem is some equilibrium issues. He is a another doctor for this. He was on meclizine but cannot take this because of glaucoma now. I told him that I did not think that his myelodysplasia was a factor. He's a little anemic but not that anemic that should be causing problems.  His erythropoietin level is fairly low. It is only 35. For him, that is on the low side.  He's had no bleeding. He's had a good appetite. He's had no fever. He's had no change in bowel or bladder habits. He's had no leg swelling. There's been no rashes. He's had no nausea.  Overall, his performance status is ECOG 1.  Medications: Current outpatient prescriptions:bimatoprost (LUMIGAN) 0.01 % SOLN, Place 1 drop into both eyes at bedtime., Disp: , Rfl: ;  albuterol (VENTOLIN HFA) 108 (90 BASE) MCG/ACT inhaler, Inhale 1-2 puffs into the lungs every 6 (six) hours as needed for wheezing or shortness of breath., Disp: , Rfl: ;  aspirin 81 MG tablet, Take 81 mg by mouth daily., Disp: , Rfl: ;  BYSTOLIC 5 MG tablet, Take 5 mg by mouth daily. , Disp: , Rfl:  Cholecalciferol (VITAMIN D3) 2000 UNITS TABS, Take 2,000 Units by mouth daily., Disp: , Rfl: ;  clopidogrel (PLAVIX) 75 MG tablet, Take 75 mg by mouth daily. , Disp: , Rfl: ;  co-enzyme Q-10 30 MG capsule, Take 100 mg by mouth daily., Disp: , Rfl: ;  Ferrous Sulfate (IRON) 325 (65 FE) MG TABS, Take 1 tablet by mouth daily., Disp: , Rfl: ;  fish oil-omega-3 fatty acids 1000 MG capsule,  Take 1 g by mouth daily., Disp: , Rfl:  Flaxseed, Linseed, (FLAX SEED OIL) 1000 MG CAPS, Take 1,000 mg by mouth daily., Disp: , Rfl: ;  losartan (COZAAR) 50 MG tablet, Take 50 mg by mouth daily. , Disp: , Rfl: ;  pantoprazole (PROTONIX) 40 MG tablet, Take 40 mg by mouth daily. , Disp: , Rfl: ;  ranitidine (ZANTAC) 300 MG tablet, Take 300 mg by mouth at bedtime. , Disp: , Rfl: ;  sertraline (ZOLOFT) 100 MG tablet, Take 50 mg by mouth daily. , Disp: , Rfl:  simvastatin (ZOCOR) 40 MG tablet, Take 40 mg by mouth every other day. , Disp: , Rfl: ;  tiotropium (SPIRIVA) 18 MCG inhalation capsule, Place 18 mcg into inhaler and inhale daily., Disp: , Rfl: ;  vitamin B-12 (CYANOCOBALAMIN) 1000 MCG tablet, Take 1,000 mcg by mouth daily., Disp: , Rfl:   Allergies: No Known Allergies  Past Medical History, Surgical history, Social history, and Family History were reviewed and updated.  Review of Systems: As above  Physical Exam:  height is _0  (1.753 m) and weight is 219 lb (99.338 kg). His oral temperature is 97.6 F (36.4 C). His blood pressure is 122/57 and his pulse is 62. His respiration is 18.   Well-developed well-nourished white gentleman. Head and neck exam shows no ocular or oral  lesions. He has no palpable cervical or supraclavicular lymph nodes. Lungs are clear. Cardiac exam regular in rhythm with no murmurs, rubs or bruits. Abdomen is soft. Has good bowel sounds. There is no fluid wave is no palpable liver or spleen tip. Back exam shows no tenderness over the spine, ribs or hips. Extremities shows no clubbing, cyanosis or edema. Skin exam shows no rashes, ecchymosis or petechia.  Lab Results  Component Value Date   WBC 3.7* 2020/12/1513   HGB 10.8* 2020/12/1513   HCT 32.4* 2020/12/1513   MCV 117* 2020/12/1513   PLT 255 2020/12/1513     Chemistry      Component Value Date/Time   NA 137 02/12/2014 1319   K 5.0 02/12/2014 1319   CL 101 02/12/2014 1319   CO2 27 02/12/2014 1319   BUN 25* 02/12/2014 1319    CREATININE 1.72* 02/12/2014 1319      Component Value Date/Time   CALCIUM 9.3 02/12/2014 1319   ALKPHOS 39 02/12/2014 1319   AST 29 02/12/2014 1319   ALT 23 02/12/2014 1319   BILITOT 0.4 02/12/2014 1319         Impression and Plan: Anthony [name] is 73 year old,. He has myelodysplasia. We did do a bone marrow biopsy on him. The cytogenetics and FISH studies came back normal.  I will see about trying a dose of Aranesp on him. He's not had this yet. I talked to him about Aranesp. He's on Plavix. He's on aspirin. I think the risk of a thromboembolic event with Aranesp is going to be less than 3%. I explained the cost. Medicare should really cover this.  I think that Aranesp my be done best way for [place] to try to get his blood count of better.   We will go ahead and give him a dose of 200 mcg.  I will plan to see him back in about 3 weeks. There was about 35 minutes with him.  Volanda Napoleon, MD 2020/07/15153:01 PM

## 2014-03-10 NOTE — Patient Instructions (Signed)
Darbepoetin Alfa injection What is this medicine? DARBEPOETIN ALFA (dar be POE e tin AL fa) helps your body make more red blood cells. It is used to treat anemia caused by chronic kidney failure and chemotherapy. This medicine may be used for other purposes; ask your health care provider or pharmacist if you have questions. COMMON BRAND NAME(S): Aranesp What should I tell my health care provider before I take this medicine? They need to know if you have any of these conditions: -blood clotting disorders or history of blood clots -cancer patient not on chemotherapy -cystic fibrosis -heart disease, such as angina, heart failure, or a history of a heart attack -hemoglobin level of 12 g/dL or greater -high blood pressure -low levels of folate, iron, or vitamin B12 -seizures -an unusual or allergic reaction to darbepoetin, erythropoietin, albumin, hamster proteins, latex, other medicines, foods, dyes, or preservatives -pregnant or trying to get pregnant -breast-feeding How should I use this medicine? This medicine is for injection into a vein or under the skin. It is usually given by a health care professional in a hospital or clinic setting. If you get this medicine at home, you will be taught how to prepare and give this medicine. Do not shake the solution before you withdraw a dose. Use exactly as directed. Take your medicine at regular intervals. Do not take your medicine more often than directed. It is important that you put your used needles and syringes in a special sharps container. Do not put them in a trash can. If you do not have a sharps container, call your pharmacist or healthcare provider to get one. Talk to your pediatrician regarding the use of this medicine in children. While this medicine may be used in children as young as 1 year for selected conditions, precautions do apply. Overdosage: If you think you have taken too much of this medicine contact a poison control center or  emergency room at once. NOTE: This medicine is only for you. Do not share this medicine with others. What if I miss a dose? If you miss a dose, take it as soon as you can. If it is almost time for your next dose, take only that dose. Do not take double or extra doses. What may interact with this medicine? Do not take this medicine with any of the following medications: -epoetin alfa This list may not describe all possible interactions. Give your health care provider a list of all the medicines, herbs, non-prescription drugs, or dietary supplements you use. Also tell them if you smoke, drink alcohol, or use illegal drugs. Some items may interact with your medicine. What should I watch for while using this medicine? Visit your prescriber or health care professional for regular checks on your progress and for the needed blood tests and blood pressure measurements. It is especially important for the doctor to make sure your hemoglobin level is in the desired range, to limit the risk of potential side effects and to give you the best benefit. Keep all appointments for any recommended tests. Check your blood pressure as directed. Ask your doctor what your blood pressure should be and when you should contact him or her. As your body makes more red blood cells, you may need to take iron, folic acid, or vitamin B supplements. Ask your doctor or health care provider which products are right for you. If you have kidney disease continue dietary restrictions, even though this medication can make you feel better. Talk with your doctor or health   care professional about the foods you eat and the vitamins that you take. What side effects may I notice from receiving this medicine? Side effects that you should report to your doctor or health care professional as soon as possible: -allergic reactions like skin rash, itching or hives, swelling of the face, lips, or tongue -breathing problems -changes in vision -chest  pain -confusion, trouble speaking or understanding -feeling faint or lightheaded, falls -high blood pressure -muscle aches or pains -pain, swelling, warmth in the leg -rapid weight gain -severe headaches -sudden numbness or weakness of the face, arm or leg -trouble walking, dizziness, loss of balance or coordination -seizures (convulsions) -swelling of the ankles, feet, hands -unusually weak or tired Side effects that usually do not require medical attention (report to your doctor or health care professional if they continue or are bothersome): -diarrhea -fever, chills (flu-like symptoms) -headaches -nausea, vomiting -redness, stinging, or swelling at site where injected This list may not describe all possible side effects. Call your doctor for medical advice about side effects. You may report side effects to FDA at 1-800-FDA-1088. Where should I keep my medicine? Keep out of the reach of children. Store in a refrigerator between 2 and 8 degrees C (36 and 46 degrees F). Do not freeze. Do not shake. Throw away any unused portion if using a single-dose vial. Throw away any unused medicine after the expiration date. NOTE: This sheet is a summary. It may not cover all possible information. If you have questions about this medicine, talk to your doctor, pharmacist, or health care provider.  2015, [employer]. (2008-05-11 10:23:57)  

## 2014-03-11 ENCOUNTER — Encounter: Payer: Self-pay | Admitting: Hematology & Oncology

## 2014-03-31 ENCOUNTER — Encounter: Payer: Self-pay | Admitting: Family

## 2014-03-31 ENCOUNTER — Ambulatory Visit (HOSPITAL_BASED_OUTPATIENT_CLINIC_OR_DEPARTMENT_OTHER): Payer: Medicare Other

## 2014-03-31 ENCOUNTER — Ambulatory Visit (HOSPITAL_BASED_OUTPATIENT_CLINIC_OR_DEPARTMENT_OTHER): Payer: Medicare Other | Admitting: Family

## 2014-03-31 ENCOUNTER — Other Ambulatory Visit (HOSPITAL_BASED_OUTPATIENT_CLINIC_OR_DEPARTMENT_OTHER): Payer: Medicare Other | Admitting: Lab

## 2014-03-31 VITALS — BP 110/53 | HR 64 | Temp 97.7°F | Resp 18 | Ht 69.0 in | Wt 220.0 lb

## 2014-03-31 DIAGNOSIS — D509 Iron deficiency anemia, unspecified: Secondary | ICD-10-CM

## 2014-03-31 DIAGNOSIS — D4620 Refractory anemia with excess of blasts, unspecified: Secondary | ICD-10-CM

## 2014-03-31 DIAGNOSIS — D46Z Other myelodysplastic syndromes: Secondary | ICD-10-CM

## 2014-03-31 LAB — CBC WITH DIFFERENTIAL (CANCER CENTER ONLY)
BASO#: 0 10*3/uL (ref 0.0–0.2)
BASO%: 0.7 % (ref 0.0–2.0)
EOS ABS: 0.1 10*3/uL (ref 0.0–0.5)
EOS%: 2.2 % (ref 0.0–7.0)
HCT: 32.4 % — ABNORMAL LOW (ref 38.7–49.9)
HGB: 10.8 g/dL — ABNORMAL LOW (ref 13.0–17.1)
LYMPH#: 0.8 10*3/uL — ABNORMAL LOW (ref 0.9–3.3)
LYMPH%: 29 % (ref 14.0–48.0)
MCH: 38.8 pg — AB (ref 28.0–33.4)
MCHC: 33.3 g/dL (ref 32.0–35.9)
MCV: 117 fL — AB (ref 82–98)
MONO#: 0.9 10*3/uL (ref 0.1–0.9)
MONO%: 31.2 % — AB (ref 0.0–13.0)
NEUT#: 1 10*3/uL — ABNORMAL LOW (ref 1.5–6.5)
NEUT%: 36.9 % — ABNORMAL LOW (ref 40.0–80.0)
PLATELETS: 213 10*3/uL (ref 145–400)
RBC: 2.78 10*6/uL — ABNORMAL LOW (ref 4.20–5.70)
RDW: 14 % (ref 11.1–15.7)
WBC: 2.8 10*3/uL — ABNORMAL LOW (ref 4.0–10.0)

## 2014-03-31 LAB — CHCC SATELLITE - SMEAR

## 2014-03-31 LAB — IRON AND TIBC CHCC
%SAT: 36 % (ref 20–55)
IRON: 113 ug/dL (ref 42–163)
TIBC: 316 ug/dL (ref 202–409)
UIBC: 203 ug/dL (ref 117–376)

## 2014-03-31 LAB — RETICULOCYTES (CHCC)
ABS Retic: 45.8 10*3/uL (ref 19.0–186.0)
RBC.: 2.86 MIL/uL — AB (ref 4.22–5.81)
Retic Ct Pct: 1.6 % (ref 0.4–2.3)

## 2014-03-31 LAB — FERRITIN CHCC: FERRITIN: 577 ng/mL — AB (ref 22–316)

## 2014-03-31 MED ORDER — DARBEPOETIN ALFA-POLYSORBATE 300 MCG/0.6ML IJ SOLN
300.0000 ug | Freq: Once | INTRAMUSCULAR | Status: AC
  Administered 2014-03-31: 300 ug via SUBCUTANEOUS

## 2014-03-31 MED ORDER — DARBEPOETIN ALFA-POLYSORBATE 300 MCG/0.6ML IJ SOLN
INTRAMUSCULAR | Status: AC
  Filled 2014-03-31: qty 0.6

## 2014-03-31 NOTE — Progress Notes (Signed)
[place]  Telephone:(336) [phone] Fax:(336) [phone]  ID: Anthony [name] OB: March 19, 1941 MR#: [id number]#:[id number] Patient Care Team: Abigail Miyamoto, MD as PCP - General (Family Medicine)  DIAGNOSIS:  Macrocytic anemia and leukopenia  INTERVAL HISTORY: Anthony [name] is here today for a follow-up. He had Aranesp at his last visit and is feeling much better. His headaches have gone and he is feeling less dizzy. He has more energy now too. He denies headaches, fever, chills, n/v, cough, rash, SOB, chest pain, palpitations, abdominal pain, constipation, diarrhea, problems urinating, blood in urine or stool. He denies swelling tenderness, numbness or tingling in his extremities. His appetite is good and he is drinking plenty of fluids. He last had iron in August.   CURRENT TREATMENT: Aranesp 200-300 mcg subcutaneous as needed for hemoglobin less than 11 Iron infusions as indicated  REVIEW OF SYSTEMS: All other 10 point review of systems is negative except for those issues mentioned above.  PAST MEDICAL HISTORY: Past Medical History  Diagnosis Date  . HTN (hypertension)   . Hyperlipidemia   . Coronary atherosclerosis of native coronary artery   . LPRD (laryngopharyngeal reflux disease)   . Depressive disorder   . 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 Iron deficiency anemia, unspecified 01/15/2014   PAST SURGICAL HISTORY: Past Surgical History  Procedure Laterality Date  . Angioplasty    . Tonsillectomy      as a child   FAMILY HISTORY Family History  Problem Relation Age of Onset  . Heart disease Father   . Hypertension Father   . Heart disease Mother    GYNECOLOGIC HISTORY:  No LMP for male patient.   SOCIAL HISTORY:  History   Social History  . Marital Status: Single    Spouse Name: N/A    Number of Children: 2  . Years of Education: N/A   Occupational History  . retired    Social History Main Topics  . Smoking status: Former Smoker -- 2.00  packs/day for 40 years    Types: Cigarettes    Start date: 08/05/1959    Quit date: 06/11/1998  . Smokeless tobacco: Never Used     Comment: quit smoking 15 years ago  . Alcohol Use: Yes     Comment: 4 cocktails qd  . Drug Use: No  . Sexual Activity: Not on file   Other Topics Concern  . Not on file   Social History Narrative  . No narrative on file   ADVANCED DIRECTIVES: <no information>  HEALTH MAINTENANCE: History  Substance Use Topics  . Smoking status: Former Smoker -- 2.00 packs/day for 40 years    Types: Cigarettes    Start date: 08/05/1959    Quit date: 06/11/1998  . Smokeless tobacco: Never Used     Comment: quit smoking 15 years ago  . Alcohol Use: Yes     Comment: 4 cocktails qd   Colonoscopy: PAP: Bone density: Lipid panel:  No Known Allergies  Current Outpatient Prescriptions  Medication Sig Dispense Refill  . albuterol (VENTOLIN HFA) 108 (90 BASE) MCG/ACT inhaler Inhale 1-2 puffs into the lungs every 6 (six) hours as needed for wheezing or shortness of breath.      Marland Kitchen aspirin 81 MG tablet Take 81 mg by mouth daily.      . bimatoprost (LUMIGAN) 0.01 % SOLN Place 1 drop into both eyes at bedtime.      Marland Kitchen BYSTOLIC 5 MG tablet Take 5 mg by mouth daily.       Marland Kitchen  Cholecalciferol (VITAMIN D3) 2000 UNITS TABS Take 2,000 Units by mouth daily.      . clopidogrel (PLAVIX) 75 MG tablet Take 75 mg by mouth daily.       Marland Kitchen co-enzyme Q-10 30 MG capsule Take 100 mg by mouth daily.      . Ferrous Sulfate (IRON) 325 (65 FE) MG TABS Take 1 tablet by mouth daily.      . fish oil-omega-3 fatty acids 1000 MG capsule Take 1 g by mouth daily.      . Flaxseed, Linseed, (FLAX SEED OIL) 1000 MG CAPS Take 1,000 mg by mouth daily.      Marland Kitchen losartan (COZAAR) 50 MG tablet Take 50 mg by mouth daily.       . pantoprazole (PROTONIX) 40 MG tablet Take 40 mg by mouth daily.       . ranitidine (ZANTAC) 300 MG tablet Take 300 mg by mouth at bedtime.       . sertraline (ZOLOFT) 100 MG tablet  Take 50 mg by mouth daily.       . simvastatin (ZOCOR) 40 MG tablet Take 40 mg by mouth every other day.       . tiotropium (SPIRIVA) 18 MCG inhalation capsule Place 18 mcg into inhaler and inhale daily.      . vitamin B-12 (CYANOCOBALAMIN) 1000 MCG tablet Take 1,000 mcg by mouth daily.       No current facility-administered medications for this visit.   OBJECTIVE: Filed Vitals:   03/31/14 0950  BP: 110/53  Pulse: 64  Temp: 97.7 F (36.5 C)  Resp: 18   Body mass index is 32.47 kg/(m^2). ECOG FS:0 - Asymptomatic Ocular: Sclerae unicteric, pupils equal, round and reactive to light Ear-nose-throat: Oropharynx clear, dentition fair Lymphatic: No cervical or supraclavicular adenopathy Lungs no rales or rhonchi, good excursion bilaterally Heart regular rate and rhythm, no murmur appreciated Abd soft, nontender, positive bowel sounds MSK no focal spinal tenderness, no joint edema Neuro: non-focal, well-oriented, appropriate affect  LAB RESULTS: CMP     Component Value Date/Time   NA 137 02/12/2014 1319   K 5.0 02/12/2014 1319   CL 101 02/12/2014 1319   CO2 27 02/12/2014 1319   GLUCOSE 98 02/12/2014 1319   BUN 25* 02/12/2014 1319   CREATININE 1.72* 02/12/2014 1319   CALCIUM 9.3 02/12/2014 1319   PROT 7.6 02/12/2014 1319   ALBUMIN 4.5 02/12/2014 1319   AST 29 02/12/2014 1319   ALT 23 02/12/2014 1319   ALKPHOS 39 02/12/2014 1319   BILITOT 0.4 02/12/2014 1319   No results found for this basename: SPEP,  UPEP,   kappa and lambda light chains   Lab Results  Component Value Date   WBC 2.8* 03/31/2014   NEUTROABS 1.0* 03/31/2014   HGB 10.8* 03/31/2014   HCT 32.4* 03/31/2014   MCV 117* 03/31/2014   PLT 213 03/31/2014   No results found for this basename: LABCA2   No components found with this basename: LABCA125   No results found for this basename: INR,  in the last 168 hours Urinalysis No results found for this basename: colorurine,  appearanceur,  labspec,  phurine,  glucoseu,  hgbur,   bilirubinur,  ketonesur,  proteinur,  urobilinogen,  nitrite,  leukocytesur   STUDIES: No results found.  ASSESSMENT/PLAN: Anthony [name] is a very pleasant 73 yo male referred to [place] with  macrocytic anemia and leukopenia.   He had iron 01/21/14. He is feeling much better and is asymptomatic today. His  hgb today is still 10.8. We will give him Aranesp 300 today. We will have to wait and see what his iron studies show. We will see him back in 1 month for labs and a follow-up.  All questions were answered. The patient knows to call the clinic with any problems, questions or concerns. We can certainly see the patient much sooner if necessary.  Eliezer Bottom, NP 03/31/2014 2:38 PM

## 2014-03-31 NOTE — Patient Instructions (Signed)
Darbepoetin Alfa injection What is this medicine? DARBEPOETIN ALFA (dar be POE e tin AL fa) helps your body make more red blood cells. It is used to treat anemia caused by chronic kidney failure and chemotherapy. This medicine may be used for other purposes; ask your health care provider or pharmacist if you have questions. COMMON BRAND NAME(S): Aranesp What should I tell my health care provider before I take this medicine? They need to know if you have any of these conditions: -blood clotting disorders or history of blood clots -cancer patient not on chemotherapy -cystic fibrosis -heart disease, such as angina, heart failure, or a history of a heart attack -hemoglobin level of 12 g/dL or greater -high blood pressure -low levels of folate, iron, or vitamin B12 -seizures -an unusual or allergic reaction to darbepoetin, erythropoietin, albumin, hamster proteins, latex, other medicines, foods, dyes, or preservatives -pregnant or trying to get pregnant -breast-feeding How should I use this medicine? This medicine is for injection into a vein or under the skin. It is usually given by a health care professional in a hospital or clinic setting. If you get this medicine at home, you will be taught how to prepare and give this medicine. Do not shake the solution before you withdraw a dose. Use exactly as directed. Take your medicine at regular intervals. Do not take your medicine more often than directed. It is important that you put your used needles and syringes in a special sharps container. Do not put them in a trash can. If you do not have a sharps container, call your pharmacist or healthcare provider to get one. Talk to your pediatrician regarding the use of this medicine in children. While this medicine may be used in children as young as 1 year for selected conditions, precautions do apply. Overdosage: If you think you have taken too much of this medicine contact a poison control center or  emergency room at once. NOTE: This medicine is only for you. Do not share this medicine with others. What if I miss a dose? If you miss a dose, take it as soon as you can. If it is almost time for your next dose, take only that dose. Do not take double or extra doses. What may interact with this medicine? Do not take this medicine with any of the following medications: -epoetin alfa This list may not describe all possible interactions. Give your health care provider a list of all the medicines, herbs, non-prescription drugs, or dietary supplements you use. Also tell them if you smoke, drink alcohol, or use illegal drugs. Some items may interact with your medicine. What should I watch for while using this medicine? Visit your prescriber or health care professional for regular checks on your progress and for the needed blood tests and blood pressure measurements. It is especially important for the doctor to make sure your hemoglobin level is in the desired range, to limit the risk of potential side effects and to give you the best benefit. Keep all appointments for any recommended tests. Check your blood pressure as directed. Ask your doctor what your blood pressure should be and when you should contact him or her. As your body makes more red blood cells, you may need to take iron, folic acid, or vitamin B supplements. Ask your doctor or health care provider which products are right for you. If you have kidney disease continue dietary restrictions, even though this medication can make you feel better. Talk with your doctor or health   care professional about the foods you eat and the vitamins that you take. What side effects may I notice from receiving this medicine? Side effects that you should report to your doctor or health care professional as soon as possible: -allergic reactions like skin rash, itching or hives, swelling of the face, lips, or tongue -breathing problems -changes in vision -chest  pain -confusion, trouble speaking or understanding -feeling faint or lightheaded, falls -high blood pressure -muscle aches or pains -pain, swelling, warmth in the leg -rapid weight gain -severe headaches -sudden numbness or weakness of the face, arm or leg -trouble walking, dizziness, loss of balance or coordination -seizures (convulsions) -swelling of the ankles, feet, hands -unusually weak or tired Side effects that usually do not require medical attention (report to your doctor or health care professional if they continue or are bothersome): -diarrhea -fever, chills (flu-like symptoms) -headaches -nausea, vomiting -redness, stinging, or swelling at site where injected This list may not describe all possible side effects. Call your doctor for medical advice about side effects. You may report side effects to FDA at 1-800-FDA-1088. Where should I keep my medicine? Keep out of the reach of children. Store in a refrigerator between 2 and 8 degrees C (36 and 46 degrees F). Do not freeze. Do not shake. Throw away any unused portion if using a single-dose vial. Throw away any unused medicine after the expiration date. NOTE: This sheet is a summary. It may not cover all possible information. If you have questions about this medicine, talk to your doctor, pharmacist, or health care provider.  2015, [employer]. (2008-05-11 10:23:57)  

## 2014-04-01 ENCOUNTER — Encounter: Payer: Self-pay | Admitting: *Deleted

## 2014-04-01 ENCOUNTER — Telehealth: Payer: Self-pay | Admitting: *Deleted

## 2014-04-01 NOTE — Telephone Encounter (Signed)
Message copied by Cordelia Poche on Thu Apr 01, 2014 12:33 PM ------      Message from: Burney Gauze R      Created: Thu Apr 01, 2014 12:25 PM       Call - iron is ok!!  pete ------

## 2014-04-28 ENCOUNTER — Other Ambulatory Visit (HOSPITAL_BASED_OUTPATIENT_CLINIC_OR_DEPARTMENT_OTHER): Payer: Medicare Other | Admitting: Lab

## 2014-04-28 ENCOUNTER — Ambulatory Visit (HOSPITAL_BASED_OUTPATIENT_CLINIC_OR_DEPARTMENT_OTHER): Payer: Medicare Other | Admitting: Hematology & Oncology

## 2014-04-28 ENCOUNTER — Encounter: Payer: Self-pay | Admitting: Hematology & Oncology

## 2014-04-28 VITALS — BP 139/67 | HR 70 | Temp 97.7°F | Resp 18 | Ht 69.0 in | Wt 221.0 lb

## 2014-04-28 DIAGNOSIS — D4620 Refractory anemia with excess of blasts, unspecified: Secondary | ICD-10-CM

## 2014-04-28 DIAGNOSIS — D509 Iron deficiency anemia, unspecified: Secondary | ICD-10-CM

## 2014-04-28 DIAGNOSIS — D72819 Decreased white blood cell count, unspecified: Secondary | ICD-10-CM

## 2014-04-28 LAB — RETICULOCYTES (CHCC)
ABS RETIC: 44.1 10*3/uL (ref 19.0–186.0)
RBC.: 2.94 MIL/uL — ABNORMAL LOW (ref 4.22–5.81)
RETIC CT PCT: 1.5 % (ref 0.4–2.3)

## 2014-04-28 LAB — CBC WITH DIFFERENTIAL (CANCER CENTER ONLY)
BASO#: 0 10*3/uL (ref 0.0–0.2)
BASO%: 0.6 % (ref 0.0–2.0)
EOS%: 1.5 % (ref 0.0–7.0)
Eosinophils Absolute: 0.1 10*3/uL (ref 0.0–0.5)
HEMATOCRIT: 33.7 % — AB (ref 38.7–49.9)
HGB: 11.1 g/dL — ABNORMAL LOW (ref 13.0–17.1)
LYMPH#: 1.3 10*3/uL (ref 0.9–3.3)
LYMPH%: 36.5 % (ref 14.0–48.0)
MCH: 38.4 pg — ABNORMAL HIGH (ref 28.0–33.4)
MCHC: 32.9 g/dL (ref 32.0–35.9)
MCV: 117 fL — ABNORMAL HIGH (ref 82–98)
MONO#: 0.8 10*3/uL (ref 0.1–0.9)
MONO%: 21.9 % — ABNORMAL HIGH (ref 0.0–13.0)
NEUT#: 1.4 10*3/uL — ABNORMAL LOW (ref 1.5–6.5)
NEUT%: 39.5 % — ABNORMAL LOW (ref 40.0–80.0)
Platelets: 208 10*3/uL (ref 145–400)
RBC: 2.89 10*6/uL — ABNORMAL LOW (ref 4.20–5.70)
RDW: 13.6 % (ref 11.1–15.7)
WBC: 3.4 10*3/uL — AB (ref 4.0–10.0)

## 2014-04-28 LAB — TECHNOLOGIST REVIEW CHCC SATELLITE

## 2014-04-28 NOTE — Progress Notes (Signed)
Hematology and Oncology Follow Up Visit  Anthony [name] [id number] 05-19-1941 73 y.o. 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 He is feeling quite good. He has more energy. He has decreased shortness of breath.  There's been no problems with bleeding. He's had no nausea or vomiting. He's had no change in bowel or bladder habits.  We last gave him Aranesp in October 21. He last got Feraheme in August 2015.  His last iron studies in October showed a ferritin of 577 with an iron saturation 36%.  He's had no fever. He's had no rashes. He's had no leg swelling.  Overall, his quality of life is better. He is quite happy that he is able to do more. Medications: Current outpatient prescriptions: albuterol (VENTOLIN HFA) 108 (90 BASE) MCG/ACT inhaler, Inhale 1-2 puffs into the lungs every 6 (six) hours as needed for wheezing or shortness of breath., Disp: , Rfl: ;  aspirin 81 MG tablet, Take 81 mg by mouth daily., Disp: , Rfl: ;  bimatoprost (LUMIGAN) 0.01 % SOLN, Place 1 drop into both eyes at bedtime., Disp: , Rfl: ;  BYSTOLIC 5 MG tablet, Take 5 mg by mouth daily. , Disp: , Rfl:  Cholecalciferol (VITAMIN D3) 2000 UNITS TABS, Take 2,000 Units by mouth daily., Disp: , Rfl: ;  clopidogrel (PLAVIX) 75 MG tablet, Take 75 mg by mouth daily. , Disp: , Rfl: ;  co-enzyme Q-10 30 MG capsule, Take 100 mg by mouth daily., Disp: , Rfl: ;  Ferrous Sulfate (IRON) 325 (65 FE) MG TABS, Take 1 tablet by mouth daily., Disp: , Rfl: ;  fish oil-omega-3 fatty acids 1000 MG capsule, Take 1 g by mouth daily., Disp: , Rfl:  Flaxseed, Linseed, (FLAX SEED OIL) 1000 MG CAPS, Take 1,000 mg by mouth daily., Disp: , Rfl: ;  losartan (COZAAR) 50 MG tablet, Take 50 mg by mouth daily. , Disp: , Rfl: ;  pantoprazole (PROTONIX) 40 MG tablet,  Take 40 mg by mouth daily. , Disp: , Rfl: ;  ranitidine (ZANTAC) 300 MG tablet, Take 300 mg by mouth at bedtime. , Disp: , Rfl: ;  sertraline (ZOLOFT) 100 MG tablet, Take 50 mg by mouth daily. , Disp: , Rfl:  simvastatin (ZOCOR) 40 MG tablet, Take 40 mg by mouth every other day. , Disp: , Rfl: ;  tiotropium (SPIRIVA) 18 MCG inhalation capsule, Place 18 mcg into inhaler and inhale daily., Disp: , Rfl: ;  vitamin B-12 (CYANOCOBALAMIN) 1000 MCG tablet, Take 1,000 mcg by mouth daily., Disp: , Rfl:   Allergies: No Known Allergies  Past Medical History, Surgical history, Social history, and Family History were reviewed and updated.  Review of Systems: As above  Physical Exam:  height is 5\' 9"  (1.753 m) and weight is 221 lb (100.245 kg). His oral temperature is 97.7 F (36.5 C). His blood pressure is 139/67 and his pulse is 70. His respiration is 18.   Well-developed and well-nourished white general. Head and neck exam shows no ocular or oral lesions. He has no scleral icterus. He has no adenopathy in the neck. Lungs are clear. Cardiac exam regular rate and rhythm with no murmurs, rubs or bruits. Abdomen is soft. Has good bowel sounds. He's mildly obese. He has no fluid wave. There is no  palpable liver or spleen tip. Back exam shows no tenderness over the spine, ribs or hips. Extremities shows no clubbing, cyanosis or edema. Skin exam no rashes, ecchymoses or petechia.  Lab Results  Component Value Date   WBC 3.4* 04/28/2014   HGB 11.1* 04/28/2014   HCT 33.7* 04/28/2014   MCV 117* 04/28/2014   PLT 208 04/28/2014     Chemistry      Component Value Date/Time   NA 137 02/12/2014 1319   K 5.0 02/12/2014 1319   CL 101 02/12/2014 1319   CO2 27 02/12/2014 1319   BUN 25* 02/12/2014 1319   CREATININE 1.72* 02/12/2014 1319      Component Value Date/Time   CALCIUM 9.3 02/12/2014 1319   ALKPHOS 39 02/12/2014 1319   AST 29 02/12/2014 1319   ALT 23 02/12/2014 1319   BILITOT 0.4 02/12/2014 1319           Impression and Plan: Anthony [name] is 73 year old gentleman. 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 He has normal cytogenetics. I would classify him as low risk.  His blood spared does not show any evidence of transformation to a more acute process.  He feels better.  We will see what his iron studies show.  I am just glad that he feels better.  We will plan to get him back in another month and see how he is feeling.  We do not have to give him any Aranesp today.   Volanda Napoleon, MD 11/18/20155:34 PM

## 2014-04-29 ENCOUNTER — Telehealth: Payer: Self-pay | Admitting: Hematology & Oncology

## 2014-04-29 LAB — IRON AND TIBC CHCC
%SAT: 34 % (ref 20–55)
Iron: 110 ug/dL (ref 42–163)
TIBC: 324 ug/dL (ref 202–409)
UIBC: 213 ug/dL (ref 117–376)

## 2014-04-29 LAB — FERRITIN CHCC: FERRITIN: 488 ng/mL — AB (ref 22–316)

## 2014-04-29 NOTE — Telephone Encounter (Signed)
Called to give dec schedule line is busy. I mailed schedule to pt

## 2014-05-20 ENCOUNTER — Other Ambulatory Visit (HOSPITAL_BASED_OUTPATIENT_CLINIC_OR_DEPARTMENT_OTHER): Payer: Medicare Other | Admitting: Lab

## 2014-05-20 ENCOUNTER — Ambulatory Visit (HOSPITAL_BASED_OUTPATIENT_CLINIC_OR_DEPARTMENT_OTHER): Payer: Medicare Other

## 2014-05-20 ENCOUNTER — Ambulatory Visit (HOSPITAL_BASED_OUTPATIENT_CLINIC_OR_DEPARTMENT_OTHER): Payer: Medicare Other | Admitting: Family

## 2014-05-20 ENCOUNTER — Encounter: Payer: Self-pay | Admitting: Family

## 2014-05-20 DIAGNOSIS — D46Z Other myelodysplastic syndromes: Secondary | ICD-10-CM

## 2014-05-20 DIAGNOSIS — D4620 Refractory anemia with excess of blasts, unspecified: Secondary | ICD-10-CM

## 2014-05-20 DIAGNOSIS — D509 Iron deficiency anemia, unspecified: Secondary | ICD-10-CM

## 2014-05-20 DIAGNOSIS — D649 Anemia, unspecified: Secondary | ICD-10-CM

## 2014-05-20 LAB — TECHNOLOGIST REVIEW CHCC SATELLITE

## 2014-05-20 LAB — CBC WITH DIFFERENTIAL (CANCER CENTER ONLY)
BASO#: 0 10*3/uL (ref 0.0–0.2)
BASO%: 0.8 % (ref 0.0–2.0)
EOS%: 1.5 % (ref 0.0–7.0)
Eosinophils Absolute: 0.1 10*3/uL (ref 0.0–0.5)
HCT: 32.3 % — ABNORMAL LOW (ref 38.7–49.9)
HGB: 10.8 g/dL — ABNORMAL LOW (ref 13.0–17.1)
LYMPH#: 1 10*3/uL (ref 0.9–3.3)
LYMPH%: 26.5 % (ref 14.0–48.0)
MCH: 39 pg — AB (ref 28.0–33.4)
MCHC: 33.4 g/dL (ref 32.0–35.9)
MCV: 117 fL — AB (ref 82–98)
MONO#: 1.1 10*3/uL — ABNORMAL HIGH (ref 0.1–0.9)
MONO%: 28.4 % — AB (ref 0.0–13.0)
NEUT%: 42.8 % (ref 40.0–80.0)
NEUTROS ABS: 1.7 10*3/uL (ref 1.5–6.5)
PLATELETS: 204 10*3/uL (ref 145–400)
RBC: 2.77 10*6/uL — ABNORMAL LOW (ref 4.20–5.70)
RDW: 13.5 % (ref 11.1–15.7)
WBC: 3.9 10*3/uL — ABNORMAL LOW (ref 4.0–10.0)

## 2014-05-20 LAB — IRON AND TIBC CHCC
%SAT: 37 % (ref 20–55)
IRON: 131 ug/dL (ref 42–163)
TIBC: 356 ug/dL (ref 202–409)
UIBC: 225 ug/dL (ref 117–376)

## 2014-05-20 LAB — CMP (CANCER CENTER ONLY)
ALK PHOS: 38 U/L (ref 26–84)
ALT(SGPT): 21 U/L (ref 10–47)
AST: 26 U/L (ref 11–38)
Albumin: 3.4 g/dL (ref 3.3–5.5)
BUN: 24 mg/dL — AB (ref 7–22)
CO2: 28 meq/L (ref 18–33)
Calcium: 8.6 mg/dL (ref 8.0–10.3)
Chloride: 103 mEq/L (ref 98–108)
Creat: 1.6 mg/dl — ABNORMAL HIGH (ref 0.6–1.2)
GLUCOSE: 82 mg/dL (ref 73–118)
Potassium: 5 mEq/L — ABNORMAL HIGH (ref 3.3–4.7)
Sodium: 144 mEq/L (ref 128–145)
TOTAL PROTEIN: 7.9 g/dL (ref 6.4–8.1)
Total Bilirubin: 0.6 mg/dl (ref 0.20–1.60)

## 2014-05-20 LAB — FERRITIN CHCC: Ferritin: 660 ng/ml — ABNORMAL HIGH (ref 22–316)

## 2014-05-20 LAB — RETICULOCYTES (CHCC)
ABS RETIC: 48.5 10*3/uL (ref 19.0–186.0)
RBC.: 2.85 MIL/uL — ABNORMAL LOW (ref 4.22–5.81)
Retic Ct Pct: 1.7 % (ref 0.4–2.3)

## 2014-05-20 LAB — CHCC SATELLITE - SMEAR

## 2014-05-20 MED ORDER — DARBEPOETIN ALFA 200 MCG/0.4ML IJ SOSY
PREFILLED_SYRINGE | INTRAMUSCULAR | Status: AC
  Filled 2014-05-20: qty 0.4

## 2014-05-20 MED ORDER — DARBEPOETIN ALFA 200 MCG/0.4ML IJ SOSY
200.0000 ug | PREFILLED_SYRINGE | Freq: Once | INTRAMUSCULAR | Status: AC
  Administered 2014-05-20: 200 ug via SUBCUTANEOUS

## 2014-05-20 NOTE — Progress Notes (Signed)
[place]  Telephone:(336) [phone] Fax:(336) [phone]  ID: Anthony [name] OB: [birth year] MR#: [id number]#:[id number] Patient Care Team: Abigail Miyamoto, MD as PCP - General (Family Medicine)  DIAGNOSIS:  Macrocytic anemia and leukopenia  INTERVAL HISTORY: Anthony [name] is here today for a follow-up. He is feeling tired and has had some dizziness without falling since his last visit.  He denies headaches, fever, chills, n/v, cough, rash, SOB, chest pain, palpitations, abdominal pain, constipation, diarrhea, problems urinating, blood in urine or stool.  No swelling tenderness, numbness or tingling in his extremities.  His appetite is good and he is drinking plenty of fluids. His weight is stable.  He last had iron in August. In November, ferritin 488 iron saturation 34.  His Hgb today was 10.8.    CURRENT TREATMENT: Aranesp 200-300 mcg subcutaneous as needed for hemoglobin less than 11 Iron infusions as indicated  REVIEW OF SYSTEMS: All other 10 point review of systems is negative except for those issues mentioned above.  PAST MEDICAL HISTORY: Past Medical History  Diagnosis Date  . HTN (hypertension)   . Hyperlipidemia   . Coronary atherosclerosis of native coronary artery   . LPRD (laryngopharyngeal reflux disease)   . Depressive disorder   . 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 Iron deficiency anemia, unspecified 01/15/2014   PAST SURGICAL HISTORY: Past Surgical History  Procedure Laterality Date  . Angioplasty    . Tonsillectomy      as a child   FAMILY HISTORY Family History  Problem Relation Age of Onset  . Heart disease Father   . Hypertension Father   . Heart disease Mother    GYNECOLOGIC HISTORY:  No LMP for male patient.   SOCIAL HISTORY:  History   Social History  . Marital Status: Single    Spouse Name: N/A    Number of Children: 2  . Years of Education: N/A   Occupational History  . retired    Social History Main Topics  .  Smoking status: Former Smoker -- 2.00 packs/day for 40 years    Types: Cigarettes    Start date: 08/05/1959    Quit date: 06/11/1998  . Smokeless tobacco: Never Used     Comment: quit smoking 15 years ago  . Alcohol Use: Yes     Comment: 4 cocktails qd  . Drug Use: No  . Sexual Activity: Not on file   Other Topics Concern  . Not on file   Social History Narrative   ADVANCED DIRECTIVES: <no information>  HEALTH MAINTENANCE: History  Substance Use Topics  . Smoking status: Former Smoker -- 2.00 packs/day for 40 years    Types: Cigarettes    Start date: 08/05/1959    Quit date: 06/11/1998  . Smokeless tobacco: Never Used     Comment: quit smoking 15 years ago  . Alcohol Use: Yes     Comment: 4 cocktails qd   Colonoscopy: PAP: Bone density: Lipid panel:  No Known Allergies  Current Outpatient Prescriptions  Medication Sig Dispense Refill  . albuterol (VENTOLIN HFA) 108 (90 BASE) MCG/ACT inhaler Inhale 1-2 puffs into the lungs every 6 (six) hours as needed for wheezing or shortness of breath.    Marland Kitchen aspirin 81 MG tablet Take 81 mg by mouth daily.    . bimatoprost (LUMIGAN) 0.01 % SOLN Place 1 drop into both eyes at bedtime.    Marland Kitchen BYSTOLIC 5 MG tablet Take 5 mg by mouth daily.     . Cholecalciferol (  VITAMIN D3) 2000 UNITS TABS Take 2,000 Units by mouth daily.    . clopidogrel (PLAVIX) 75 MG tablet Take 75 mg by mouth daily.     Marland Kitchen co-enzyme Q-10 30 MG capsule Take 100 mg by mouth daily.    . Ferrous Sulfate (IRON) 325 (65 FE) MG TABS Take 1 tablet by mouth daily.    . fish oil-omega-3 fatty acids 1000 MG capsule Take 1 g by mouth daily.    . Flaxseed, Linseed, (FLAX SEED OIL) 1000 MG CAPS Take 1,000 mg by mouth daily.    Marland Kitchen losartan (COZAAR) 50 MG tablet Take 50 mg by mouth daily.     . pantoprazole (PROTONIX) 40 MG tablet Take 40 mg by mouth daily.     . ranitidine (ZANTAC) 300 MG tablet Take 300 mg by mouth at bedtime.     . sertraline (ZOLOFT) 100 MG tablet Take 50 mg by  mouth daily.     . simvastatin (ZOCOR) 40 MG tablet Take 40 mg by mouth every other day.     . tiotropium (SPIRIVA) 18 MCG inhalation capsule Place 18 mcg into inhaler and inhale daily.    . vitamin B-12 (CYANOCOBALAMIN) 1000 MCG tablet Take 1,000 mcg by mouth daily.     No current facility-administered medications for this visit.   OBJECTIVE: Filed Vitals:   05/20/14 1122  BP: 126/58  Pulse: 67  Temp: 97.2 F (36.2 C)  Resp: 18   Body mass index is 32.77 kg/(m^2). ECOG FS:0 - Asymptomatic Ocular: Sclerae unicteric, pupils equal, round and reactive to light Ear-nose-throat: Oropharynx clear, dentition fair Lymphatic: No cervical or supraclavicular adenopathy Lungs no rales or rhonchi, good excursion bilaterally Heart regular rate and rhythm, no murmur appreciated Abd soft, nontender, positive bowel sounds MSK no focal spinal tenderness, no joint edema Neuro: non-focal, well-oriented, appropriate affect  LAB RESULTS: CMP     Component Value Date/Time   NA 144 05/20/2014 1112   NA 137 02/12/2014 1319   K 5.0* 05/20/2014 1112   K 5.0 02/12/2014 1319   CL 103 05/20/2014 1112   CL 101 02/12/2014 1319   CO2 28 05/20/2014 1112   CO2 27 02/12/2014 1319   GLUCOSE 82 05/20/2014 1112   GLUCOSE 98 02/12/2014 1319   BUN 24* 05/20/2014 1112   BUN 25* 02/12/2014 1319   CREATININE 1.6* 05/20/2014 1112   CREATININE 1.72* 02/12/2014 1319   CALCIUM 8.6 05/20/2014 1112   CALCIUM 9.3 02/12/2014 1319   PROT 7.9 05/20/2014 1112   PROT 7.6 02/12/2014 1319   ALBUMIN 4.5 02/12/2014 1319   AST 26 05/20/2014 1112   AST 29 02/12/2014 1319   ALT 21 05/20/2014 1112   ALT 23 02/12/2014 1319   ALKPHOS 38 05/20/2014 1112   ALKPHOS 39 02/12/2014 1319   BILITOT 0.60 05/20/2014 1112   BILITOT 0.4 02/12/2014 1319   No results found for: SPEP Lab Results  Component Value Date   WBC 3.9* 05/20/2014   NEUTROABS 1.7 05/20/2014   HGB 10.8* 05/20/2014   HCT 32.3* 05/20/2014   MCV 117* 05/20/2014    PLT 204 05/20/2014   No results found for: LABCA2 No components found for: [id number] No results for input(s): INR in the last 168 hours. Urinalysis No results found for: COLORURINE STUDIES: No results found.  ASSESSMENT/PLAN: Anthony [name] is a very pleasant 73 yo male referred to [place] with macrocytic anemia and leukopenia. He had iron 01/21/14. He is feeling tired and dizzy at times.  His hgb today  is still 10.8. We will give him Aranesp 200 today. We will wait and see what his iron studies show. We will see him back in 1 month for labs and a follow-up.  All questions were answered. The patient knows to call the clinic with any problems, questions or concerns. We can certainly see the patient much sooner if necessary.  Eliezer Bottom, NP 05/20/2014 12:19 PM

## 2014-05-20 NOTE — Patient Instructions (Signed)
Darbepoetin Alfa injection What is this medicine? DARBEPOETIN ALFA (dar be POE e tin AL fa) helps your body make more red blood cells. It is used to treat anemia caused by chronic kidney failure and chemotherapy. This medicine may be used for other purposes; ask your health care provider or pharmacist if you have questions. COMMON BRAND NAME(S): Aranesp What should I tell my health care provider before I take this medicine? They need to know if you have any of these conditions: -blood clotting disorders or history of blood clots -cancer patient not on chemotherapy -cystic fibrosis -heart disease, such as angina, heart failure, or a history of a heart attack -hemoglobin level of 12 g/dL or greater -high blood pressure -low levels of folate, iron, or vitamin B12 -seizures -an unusual or allergic reaction to darbepoetin, erythropoietin, albumin, hamster proteins, latex, other medicines, foods, dyes, or preservatives -pregnant or trying to get pregnant -breast-feeding How should I use this medicine? This medicine is for injection into a vein or under the skin. It is usually given by a health care professional in a hospital or clinic setting. If you get this medicine at home, you will be taught how to prepare and give this medicine. Do not shake the solution before you withdraw a dose. Use exactly as directed. Take your medicine at regular intervals. Do not take your medicine more often than directed. It is important that you put your used needles and syringes in a special sharps container. Do not put them in a trash can. If you do not have a sharps container, call your pharmacist or healthcare provider to get one. Talk to your pediatrician regarding the use of this medicine in children. While this medicine may be used in children as young as 1 year for selected conditions, precautions do apply. Overdosage: If you think you have taken too much of this medicine contact a poison control center or  emergency room at once. NOTE: This medicine is only for you. Do not share this medicine with others. What if I miss a dose? If you miss a dose, take it as soon as you can. If it is almost time for your next dose, take only that dose. Do not take double or extra doses. What may interact with this medicine? Do not take this medicine with any of the following medications: -epoetin alfa This list may not describe all possible interactions. Give your health care provider a list of all the medicines, herbs, non-prescription drugs, or dietary supplements you use. Also tell them if you smoke, drink alcohol, or use illegal drugs. Some items may interact with your medicine. What should I watch for while using this medicine? Visit your prescriber or health care professional for regular checks on your progress and for the needed blood tests and blood pressure measurements. It is especially important for the doctor to make sure your hemoglobin level is in the desired range, to limit the risk of potential side effects and to give you the best benefit. Keep all appointments for any recommended tests. Check your blood pressure as directed. Ask your doctor what your blood pressure should be and when you should contact him or her. As your body makes more red blood cells, you may need to take iron, folic acid, or vitamin B supplements. Ask your doctor or health care provider which products are right for you. If you have kidney disease continue dietary restrictions, even though this medication can make you feel better. Talk with your doctor or health   care professional about the foods you eat and the vitamins that you take. What side effects may I notice from receiving this medicine? Side effects that you should report to your doctor or health care professional as soon as possible: -allergic reactions like skin rash, itching or hives, swelling of the face, lips, or tongue -breathing problems -changes in vision -chest  pain -confusion, trouble speaking or understanding -feeling faint or lightheaded, falls -high blood pressure -muscle aches or pains -pain, swelling, warmth in the leg -rapid weight gain -severe headaches -sudden numbness or weakness of the face, arm or leg -trouble walking, dizziness, loss of balance or coordination -seizures (convulsions) -swelling of the ankles, feet, hands -unusually weak or tired Side effects that usually do not require medical attention (report to your doctor or health care professional if they continue or are bothersome): -diarrhea -fever, chills (flu-like symptoms) -headaches -nausea, vomiting -redness, stinging, or swelling at site where injected This list may not describe all possible side effects. Call your doctor for medical advice about side effects. You may report side effects to FDA at 1-800-FDA-1088. Where should I keep my medicine? Keep out of the reach of children. Store in a refrigerator between 2 and 8 degrees C (36 and 46 degrees F). Do not freeze. Do not shake. Throw away any unused portion if using a single-dose vial. Throw away any unused medicine after the expiration date. NOTE: This sheet is a summary. It may not cover all possible information. If you have questions about this medicine, talk to your doctor, pharmacist, or health care provider.  2015, [employer]. (2008-05-11 10:23:57)  

## 2014-05-26 ENCOUNTER — Other Ambulatory Visit: Payer: Medicare Other | Admitting: Lab

## 2014-05-26 ENCOUNTER — Ambulatory Visit: Payer: Medicare Other | Admitting: Family

## 2014-06-11 HISTORY — PX: BONE MARROW BIOPSY: SHX199

## 2014-06-15 ENCOUNTER — Ambulatory Visit (HOSPITAL_BASED_OUTPATIENT_CLINIC_OR_DEPARTMENT_OTHER): Payer: Medicare Other | Admitting: Hematology & Oncology

## 2014-06-15 ENCOUNTER — Encounter: Payer: Self-pay | Admitting: Hematology & Oncology

## 2014-06-15 ENCOUNTER — Ambulatory Visit (HOSPITAL_BASED_OUTPATIENT_CLINIC_OR_DEPARTMENT_OTHER): Payer: Medicare Other

## 2014-06-15 ENCOUNTER — Other Ambulatory Visit (HOSPITAL_BASED_OUTPATIENT_CLINIC_OR_DEPARTMENT_OTHER): Payer: Medicare Other | Admitting: Lab

## 2014-06-15 VITALS — BP 112/60 | HR 66 | Temp 97.4°F | Resp 20 | Ht 69.0 in | Wt 222.0 lb

## 2014-06-15 DIAGNOSIS — D46Z Other myelodysplastic syndromes: Secondary | ICD-10-CM

## 2014-06-15 DIAGNOSIS — D509 Iron deficiency anemia, unspecified: Secondary | ICD-10-CM

## 2014-06-15 DIAGNOSIS — D649 Anemia, unspecified: Secondary | ICD-10-CM

## 2014-06-15 DIAGNOSIS — D4620 Refractory anemia with excess of blasts, unspecified: Secondary | ICD-10-CM

## 2014-06-15 LAB — CBC WITH DIFFERENTIAL (CANCER CENTER ONLY)
BASO#: 0 10*3/uL (ref 0.0–0.2)
BASO%: 1 % (ref 0.0–2.0)
EOS%: 1 % (ref 0.0–7.0)
Eosinophils Absolute: 0 10*3/uL (ref 0.0–0.5)
HCT: 32.5 % — ABNORMAL LOW (ref 38.7–49.9)
HGB: 10.6 g/dL — ABNORMAL LOW (ref 13.0–17.1)
LYMPH#: 0.8 10*3/uL — ABNORMAL LOW (ref 0.9–3.3)
LYMPH%: 26.8 % (ref 14.0–48.0)
MCH: 38.1 pg — ABNORMAL HIGH (ref 28.0–33.4)
MCHC: 32.6 g/dL (ref 32.0–35.9)
MCV: 117 fL — ABNORMAL HIGH (ref 82–98)
MONO#: 1.1 10*3/uL — AB (ref 0.1–0.9)
MONO%: 34.1 % — ABNORMAL HIGH (ref 0.0–13.0)
NEUT%: 37.1 % — AB (ref 40.0–80.0)
NEUTROS ABS: 1.2 10*3/uL — AB (ref 1.5–6.5)
Platelets: 217 10*3/uL (ref 145–400)
RBC: 2.78 10*6/uL — ABNORMAL LOW (ref 4.20–5.70)
RDW: 13.7 % (ref 11.1–15.7)
WBC: 3.1 10*3/uL — AB (ref 4.0–10.0)

## 2014-06-15 LAB — RETICULOCYTES (CHCC)
ABS Retic: 56.8 10*3/uL (ref 19.0–186.0)
RBC.: 2.84 MIL/uL — AB (ref 4.22–5.81)
RETIC CT PCT: 2 % (ref 0.4–2.3)

## 2014-06-15 LAB — IRON AND TIBC CHCC
%SAT: 38 % (ref 20–55)
Iron: 131 ug/dL (ref 42–163)
TIBC: 340 ug/dL (ref 202–409)
UIBC: 209 ug/dL (ref 117–376)

## 2014-06-15 LAB — CMP (CANCER CENTER ONLY)
ALBUMIN: 3.5 g/dL (ref 3.3–5.5)
ALT(SGPT): 18 U/L (ref 10–47)
AST: 26 U/L (ref 11–38)
Alkaline Phosphatase: 37 U/L (ref 26–84)
BUN: 25 mg/dL — AB (ref 7–22)
CHLORIDE: 103 meq/L (ref 98–108)
CO2: 28 meq/L (ref 18–33)
CREATININE: 1.4 mg/dL — AB (ref 0.6–1.2)
Calcium: 9.5 mg/dL (ref 8.0–10.3)
Glucose, Bld: 87 mg/dL (ref 73–118)
Potassium: 4.7 mEq/L (ref 3.3–4.7)
Sodium: 143 mEq/L (ref 128–145)
Total Bilirubin: 0.5 mg/dl (ref 0.20–1.60)
Total Protein: 7.9 g/dL (ref 6.4–8.1)

## 2014-06-15 LAB — CHCC SATELLITE - SMEAR

## 2014-06-15 LAB — FERRITIN CHCC: Ferritin: 455 ng/ml — ABNORMAL HIGH (ref 22–316)

## 2014-06-15 MED ORDER — DARBEPOETIN ALFA 500 MCG/ML IJ SOSY
PREFILLED_SYRINGE | INTRAMUSCULAR | Status: AC
  Filled 2014-06-15: qty 1

## 2014-06-15 MED ORDER — DARBEPOETIN ALFA 500 MCG/ML IJ SOSY
400.0000 ug | PREFILLED_SYRINGE | Freq: Once | INTRAMUSCULAR | Status: AC
  Administered 2014-06-15: 400 ug via SUBCUTANEOUS

## 2014-06-15 MED ORDER — DARBEPOETIN ALFA 200 MCG/0.4ML IJ SOSY
PREFILLED_SYRINGE | INTRAMUSCULAR | Status: AC
  Filled 2014-06-15: qty 0.8

## 2014-06-15 NOTE — Patient Instructions (Signed)
Darbepoetin Alfa injection What is this medicine? DARBEPOETIN ALFA (dar be POE e tin AL fa) helps your body make more red blood cells. It is used to treat anemia caused by chronic kidney failure and chemotherapy. This medicine may be used for other purposes; ask your health care provider or pharmacist if you have questions. COMMON BRAND NAME(S): Aranesp What should I tell my health care provider before I take this medicine? They need to know if you have any of these conditions: -blood clotting disorders or history of blood clots -cancer patient not on chemotherapy -cystic fibrosis -heart disease, such as angina, heart failure, or a history of a heart attack -hemoglobin level of 12 g/dL or greater -high blood pressure -low levels of folate, iron, or vitamin B12 -seizures -an unusual or allergic reaction to darbepoetin, erythropoietin, albumin, hamster proteins, latex, other medicines, foods, dyes, or preservatives -pregnant or trying to get pregnant -breast-feeding How should I use this medicine? This medicine is for injection into a vein or under the skin. It is usually given by a health care professional in a hospital or clinic setting. If you get this medicine at home, you will be taught how to prepare and give this medicine. Do not shake the solution before you withdraw a dose. Use exactly as directed. Take your medicine at regular intervals. Do not take your medicine more often than directed. It is important that you put your used needles and syringes in a special sharps container. Do not put them in a trash can. If you do not have a sharps container, call your pharmacist or healthcare provider to get one. Talk to your pediatrician regarding the use of this medicine in children. While this medicine may be used in children as young as 1 year for selected conditions, precautions do apply. Overdosage: If you think you have taken too much of this medicine contact a poison control center or  emergency room at once. NOTE: This medicine is only for you. Do not share this medicine with others. What if I miss a dose? If you miss a dose, take it as soon as you can. If it is almost time for your next dose, take only that dose. Do not take double or extra doses. What may interact with this medicine? Do not take this medicine with any of the following medications: -epoetin alfa This list may not describe all possible interactions. Give your health care provider a list of all the medicines, herbs, non-prescription drugs, or dietary supplements you use. Also tell them if you smoke, drink alcohol, or use illegal drugs. Some items may interact with your medicine. What should I watch for while using this medicine? Visit your prescriber or health care professional for regular checks on your progress and for the needed blood tests and blood pressure measurements. It is especially important for the doctor to make sure your hemoglobin level is in the desired range, to limit the risk of potential side effects and to give you the best benefit. Keep all appointments for any recommended tests. Check your blood pressure as directed. Ask your doctor what your blood pressure should be and when you should contact him or her. As your body makes more red blood cells, you may need to take iron, folic acid, or vitamin B supplements. Ask your doctor or health care provider which products are right for you. If you have kidney disease continue dietary restrictions, even though this medication can make you feel better. Talk with your doctor or health   care professional about the foods you eat and the vitamins that you take. What side effects may I notice from receiving this medicine? Side effects that you should report to your doctor or health care professional as soon as possible: -allergic reactions like skin rash, itching or hives, swelling of the face, lips, or tongue -breathing problems -changes in vision -chest  pain -confusion, trouble speaking or understanding -feeling faint or lightheaded, falls -high blood pressure -muscle aches or pains -pain, swelling, warmth in the leg -rapid weight gain -severe headaches -sudden numbness or weakness of the face, arm or leg -trouble walking, dizziness, loss of balance or coordination -seizures (convulsions) -swelling of the ankles, feet, hands -unusually weak or tired Side effects that usually do not require medical attention (report to your doctor or health care professional if they continue or are bothersome): -diarrhea -fever, chills (flu-like symptoms) -headaches -nausea, vomiting -redness, stinging, or swelling at site where injected This list may not describe all possible side effects. Call your doctor for medical advice about side effects. You may report side effects to FDA at 1-800-FDA-1088. Where should I keep my medicine? Keep out of the reach of children. Store in a refrigerator between 2 and 8 degrees C (36 and 46 degrees F). Do not freeze. Do not shake. Throw away any unused portion if using a single-dose vial. Throw away any unused medicine after the expiration date. NOTE: This sheet is a summary. It may not cover all possible information. If you have questions about this medicine, talk to your doctor, pharmacist, or health care provider.  2015, [employer]. (2008-05-11 10:23:57)  

## 2014-06-16 ENCOUNTER — Encounter: Payer: Self-pay | Admitting: Pulmonary Disease

## 2014-06-16 ENCOUNTER — Ambulatory Visit (INDEPENDENT_AMBULATORY_CARE_PROVIDER_SITE_OTHER): Payer: Medicare Other | Admitting: Pulmonary Disease

## 2014-06-16 VITALS — BP 122/76 | HR 78 | Temp 98.0°F | Ht 72.0 in | Wt 225.0 lb

## 2014-06-16 DIAGNOSIS — J438 Other emphysema: Secondary | ICD-10-CM

## 2014-06-16 MED ORDER — FLUTICASONE FUROATE-VILANTEROL 100-25 MCG/INH IN AEPB: 1.0000 | INHALATION_SPRAY | Freq: Every day | RESPIRATORY_TRACT | Status: DC

## 2014-06-16 MED ORDER — CYANOCOBALAMIN 2500 MCG PO TABS: 5000.0000 ug | ORAL_TABLET | Freq: Every day | ORAL | Status: AC

## 2014-06-16 NOTE — Patient Instructions (Signed)
Stay on spiriva each am Start breo 100, one inhalation each am.  Rinse mouth and back of throat well after using.  Please call me in 4 weeks and give feedback with how things are going. Work on staying as active as possible. followup with me again in 70mos.

## 2014-06-16 NOTE — Assessment & Plan Note (Signed)
The patient has been having increased breathing issues recently, but believes it is related to his anemia. He feels much better with respect to his breathing when he gets his Aranesp shots. He has not had any significant chest congestion or cough with purulence, but is having a lot of postnasal drip. He did not see a difference with stiolto, and currently is on Spiriva alone. I would like to give him a trial of a LABA/ICS to see if he sees any difference. The patient will give me some feedback about this. In the meantime, I have encouraged him to stay as active as possible, and to work on weight reduction. He is to also maintain follow-up with his hematologist regarding his myelodysplasia.

## 2014-06-16 NOTE — Progress Notes (Signed)
Hematology and Oncology Follow Up Visit  Anthony [name] [id number] 1941/04/23 74 y.o. 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  [name] is back for follow-up. He is feeling somewhat fatigued. He really did not enjoy the holidays as much as he would like to have. He just was tired.  He said the last Aranesp shot really did not help as much. He is not been iron deficient. His last ferritin was 660 with an iron saturation of 37%. He does not have as much energy.   There's been no problems with bleeding. He's had no nausea or vomiting. He's had no change in bowel or bladder habits.  We last gave him Aranesp in November. He last got Feraheme in August 2015.  I will try to increase his Aranesp.  I think he is on too many medications., Sure why he's on all these medicines. I will have to make some adjustments to his medication list.  He's had no fever. He's had no rashes. He's had no leg swelling.  Overall, his quality of life is better. He is quite happy that he is able to do more. Medications: Current outpatient prescriptions: albuterol (VENTOLIN HFA) 108 (90 BASE) MCG/ACT inhaler, Inhale 1-2 puffs into the lungs every 6 (six) hours as needed for wheezing or shortness of breath., Disp: , Rfl: ;  aspirin 81 MG tablet, Take 81 mg by mouth daily., Disp: , Rfl: ;  bimatoprost (LUMIGAN) 0.01 % SOLN, Place 1 drop into both eyes at bedtime., Disp: , Rfl: ;  BYSTOLIC 5 MG tablet, Take 5 mg by mouth daily. , Disp: , Rfl:  Cholecalciferol (VITAMIN D3) 2000 UNITS TABS, Take 2,000 Units by mouth daily., Disp: , Rfl: ;  clopidogrel (PLAVIX) 75 MG tablet, Take 75 mg by mouth daily. , Disp: , Rfl: ;  co-enzyme Q-10 30 MG capsule, Take 100 mg by mouth daily., Disp: , Rfl: ;  Ferrous Sulfate (IRON) 325 (65 FE) MG TABS, Take 1 tablet by mouth daily.,  Disp: , Rfl: ;  fish oil-omega-3 fatty acids 1000 MG capsule, Take 1 g by mouth daily., Disp: , Rfl:  Flaxseed, Linseed, (FLAX SEED OIL) 1000 MG CAPS, Take 1,000 mg by mouth daily., Disp: , Rfl: ;  losartan (COZAAR) 50 MG tablet, Take 50 mg by mouth daily. , Disp: , Rfl: ;  pantoprazole (PROTONIX) 40 MG tablet, Take 40 mg by mouth daily. , Disp: , Rfl: ;  ranitidine (ZANTAC) 300 MG tablet, Take 300 mg by mouth at bedtime. , Disp: , Rfl: ;  sertraline (ZOLOFT) 100 MG tablet, Take 50 mg by mouth daily. , Disp: , Rfl:  simvastatin (ZOCOR) 40 MG tablet, Take 40 mg by mouth every other day. , Disp: , Rfl: ;  tiotropium (SPIRIVA) 18 MCG inhalation capsule, Place 18 mcg into inhaler and inhale daily., Disp: , Rfl: ;  vitamin B-12 (CYANOCOBALAMIN) 1000 MCG tablet, Take 1,000 mcg by mouth daily., Disp: , Rfl:   Allergies: No Known Allergies  Past Medical History, Surgical history, Social history, and Family History were reviewed and updated.  Review of Systems: As above  Physical Exam:  height is 5\' 9"  (1.753 m) and weight is 222 lb (100.699 kg). His oral temperature is 97.4 F (36.3 C). His blood pressure is 112/60 and his pulse is 66. His respiration  is 34.   Well-developed and well-nourished white general. Head and neck exam shows no ocular or oral lesions. He has no scleral icterus. He has no adenopathy in the neck. Lungs are clear. Cardiac exam regular rate and rhythm with no murmurs, rubs or bruits. Abdomen is soft. Has good bowel sounds. He's mildly obese. He has no fluid wave. There is no palpable liver or spleen tip. Back exam shows no tenderness over the spine, ribs or hips. Extremities shows no clubbing, cyanosis or edema. Skin exam no rashes, ecchymoses or petechia.  Lab Results  Component Value Date   WBC 3.1* 06/15/2014   HGB 10.6* 06/15/2014   HCT 32.5* 06/15/2014   MCV 117* 06/15/2014   PLT 217 06/15/2014     Chemistry      Component Value Date/Time   NA 143 06/15/2014 1015   NA  137 02/12/2014 1319   K 4.7 06/15/2014 1015   K 5.0 02/12/2014 1319   CL 103 06/15/2014 1015   CL 101 02/12/2014 1319   CO2 28 06/15/2014 1015   CO2 27 02/12/2014 1319   BUN 25* 06/15/2014 1015   BUN 25* 02/12/2014 1319   CREATININE 1.4* 06/15/2014 1015   CREATININE 1.72* 02/12/2014 1319      Component Value Date/Time   CALCIUM 9.5 06/15/2014 1015   CALCIUM 9.3 02/12/2014 1319   ALKPHOS 37 06/15/2014 1015   ALKPHOS 39 02/12/2014 1319   AST 26 06/15/2014 1015   AST 29 02/12/2014 1319   ALT 18 06/15/2014 1015   ALT 23 02/12/2014 1319   BILITOT 0.50 06/15/2014 1015   BILITOT 0.4 02/12/2014 1319         Impression and Plan: Anthony [name] is 74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 He has refractory anemia. He has normal cytogenetics. I would classify him as low risk. Overall, he's done pretty well with it. He has improved since we first saw him.  His blood smear does not show any evidence of transformation to a more acute process. His physical exam is pre-much unremarkable.  We will see if the higher dose of Aranesp might make him feel better.  Today, his iron studies show ferritin of 455. His iron saturation is 38%. As such, his iron levels are good and he does not need any supplemental iron..  I just want to make him feel better. I want his quality of life to be more productive.  We will plan to get him back in another month and see how he is feeling.  I spent about 35 minutes with he and his wife. I went over his labs. We adjusted his medications. We decided about the increased and Aranesp dose and went over the reasons why.     Volanda Napoleon, MD 1/6/20167:22 AM

## 2014-06-16 NOTE — Progress Notes (Signed)
   Subjective:    Patient ID: Anthony [name], male    DOB: April 02, 1941, 74 y.o.   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  He was tried on stiolto at the last visit, and really saw no improvement over Spiriva alone. He is also been diagnosed with myelodysplasia, and is having issues with anemia which then affects his breathing. He does feel much better when he gets his Aranesp injections. He currently has no significant chest congestion or cough, but is having a lot of postnasal drip. He does not feel that his dyspnea is at baseline.   Review of Systems  Constitutional: Negative for fever and unexpected weight change.  HENT: Positive for congestion and postnasal drip. Negative for dental problem, ear pain, nosebleeds, rhinorrhea, sinus pressure, sneezing, sore throat and trouble swallowing.   Eyes: Negative for redness and itching.  Respiratory: Positive for cough and shortness of breath. Negative for chest tightness and wheezing.   Cardiovascular: Negative for palpitations and leg swelling.  Gastrointestinal: Negative for nausea and vomiting.  Genitourinary: Negative for dysuria.  Musculoskeletal: Negative for joint swelling.  Skin: Negative for rash.  Neurological: Negative for headaches.  Hematological: Does not bruise/bleed easily.  Psychiatric/Behavioral: Negative for dysphoric mood. The patient is not nervous/anxious.        Objective:   Physical Exam Overweight male in no acute distress Nose without purulence or discharge noted Neck without lymphadenopathy or thyromegaly Chest with decreased breath sounds, no active wheezes or crackles Cardiac exam with regular rate and rhythm Lower extremities with minimal edema, no cyanosis Alert and oriented, moves all 4 extremities.       Assessment & Plan:

## 2014-07-15 ENCOUNTER — Encounter: Payer: Self-pay | Admitting: Hematology & Oncology

## 2014-07-15 ENCOUNTER — Other Ambulatory Visit (HOSPITAL_BASED_OUTPATIENT_CLINIC_OR_DEPARTMENT_OTHER): Payer: Medicare Other | Admitting: Lab

## 2014-07-15 ENCOUNTER — Ambulatory Visit (HOSPITAL_BASED_OUTPATIENT_CLINIC_OR_DEPARTMENT_OTHER): Payer: Medicare Other

## 2014-07-15 ENCOUNTER — Ambulatory Visit (HOSPITAL_BASED_OUTPATIENT_CLINIC_OR_DEPARTMENT_OTHER): Payer: Medicare Other | Admitting: Hematology & Oncology

## 2014-07-15 VITALS — BP 148/72 | HR 65 | Temp 97.5°F | Resp 18 | Ht 72.0 in | Wt 225.0 lb

## 2014-07-15 DIAGNOSIS — D464 Refractory anemia, unspecified: Secondary | ICD-10-CM

## 2014-07-15 DIAGNOSIS — D4620 Refractory anemia with excess of blasts, unspecified: Secondary | ICD-10-CM

## 2014-07-15 DIAGNOSIS — D509 Iron deficiency anemia, unspecified: Secondary | ICD-10-CM

## 2014-07-15 LAB — CBC WITH DIFFERENTIAL (CANCER CENTER ONLY)
BASO#: 0 10*3/uL (ref 0.0–0.2)
BASO%: 0.6 % (ref 0.0–2.0)
EOS%: 1.2 % (ref 0.0–7.0)
Eosinophils Absolute: 0 10*3/uL (ref 0.0–0.5)
HEMATOCRIT: 30.8 % — AB (ref 38.7–49.9)
HGB: 10 g/dL — ABNORMAL LOW (ref 13.0–17.1)
LYMPH#: 1 10*3/uL (ref 0.9–3.3)
LYMPH%: 28.1 % (ref 14.0–48.0)
MCH: 37.7 pg — ABNORMAL HIGH (ref 28.0–33.4)
MCHC: 32.5 g/dL (ref 32.0–35.9)
MCV: 116 fL — ABNORMAL HIGH (ref 82–98)
MONO#: 1.2 10*3/uL — AB (ref 0.1–0.9)
MONO%: 33.9 % — ABNORMAL HIGH (ref 0.0–13.0)
NEUT#: 1.2 10*3/uL — ABNORMAL LOW (ref 1.5–6.5)
NEUT%: 36.2 % — AB (ref 40.0–80.0)
Platelets: 231 10*3/uL (ref 145–400)
RBC: 2.65 10*6/uL — ABNORMAL LOW (ref 4.20–5.70)
RDW: 14.3 % (ref 11.1–15.7)
WBC: 3.4 10*3/uL — AB (ref 4.0–10.0)

## 2014-07-15 LAB — CHCC SATELLITE - SMEAR

## 2014-07-15 LAB — CMP (CANCER CENTER ONLY)
ALT: 19 U/L (ref 10–47)
AST: 24 U/L (ref 11–38)
Albumin: 3.3 g/dL (ref 3.3–5.5)
Alkaline Phosphatase: 36 U/L (ref 26–84)
BUN, Bld: 19 mg/dL (ref 7–22)
CALCIUM: 8.5 mg/dL (ref 8.0–10.3)
CO2: 27 mEq/L (ref 18–33)
Chloride: 99 mEq/L (ref 98–108)
Creat: 1.2 mg/dl (ref 0.6–1.2)
GLUCOSE: 82 mg/dL (ref 73–118)
Potassium: 4.5 mEq/L (ref 3.3–4.7)
Sodium: 139 mEq/L (ref 128–145)
Total Bilirubin: 0.5 mg/dl (ref 0.20–1.60)
Total Protein: 7.5 g/dL (ref 6.4–8.1)

## 2014-07-15 LAB — RETICULOCYTES (CHCC)
ABS Retic: 56.5 10*3/uL (ref 19.0–186.0)
RBC.: 2.69 MIL/uL — ABNORMAL LOW (ref 4.22–5.81)
Retic Ct Pct: 2.1 % (ref 0.4–2.3)

## 2014-07-15 LAB — TECHNOLOGIST REVIEW CHCC SATELLITE

## 2014-07-15 MED ORDER — DARBEPOETIN ALFA 500 MCG/ML IJ SOSY
400.0000 ug | PREFILLED_SYRINGE | Freq: Once | INTRAMUSCULAR | Status: AC
  Administered 2014-07-15: 400 ug via SUBCUTANEOUS

## 2014-07-15 MED ORDER — DARBEPOETIN ALFA 200 MCG/0.4ML IJ SOSY
PREFILLED_SYRINGE | INTRAMUSCULAR | Status: AC
  Filled 2014-07-15: qty 0.8

## 2014-07-15 NOTE — Progress Notes (Signed)
Hematology and Oncology Follow Up Visit  Anthony [name] [id number] [birth year] 74 y.o. 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  [name] is back for follow-up. He is feeling somewhat better. Hopefully, the increased Aranesp dose helped.   His last iron studies showed a ferritin 455. Iron saturation was 38%. Total iron was 131. We have not had to give him iron for several months.  There's been no problems with bleeding. He's had no nausea or vomiting. He's had no change in bowel or bladder habits.  Overall, his quality of life is better. He is quite happy that he is able to do more. Medications:  Current outpatient prescriptions:  .  Albuterol Sulfate (PROAIR RESPICLICK) 580 (90 BASE) MCG/ACT AEPB, Inhale 2 puffs into the lungs every 6 (six) hours as needed., Disp: , Rfl:  .  aspirin 81 MG tablet, Take 81 mg by mouth daily., Disp: , Rfl:  .  bimatoprost (LUMIGAN) 0.01 % SOLN, Place 1 drop into both eyes at bedtime., Disp: , Rfl:  .  BYSTOLIC 5 MG tablet, Take 5 mg by mouth daily. , Disp: , Rfl:  .  Cholecalciferol (VITAMIN D3) 2000 UNITS TABS, Take 2,000 Units by mouth daily., Disp: , Rfl:  .  clopidogrel (PLAVIX) 75 MG tablet, Take 75 mg by mouth daily. , Disp: , Rfl:  .  co-enzyme Q-10 30 MG capsule, Take 100 mg by mouth daily., Disp: , Rfl:  .  Cyanocobalamin (VITAMIN B-12) 2500 MCG TABS, Take 5,000 mcg by mouth daily., Disp: 90 tablet, Rfl: 9 .  fish oil-omega-3 fatty acids 1000 MG capsule, Take 1 g by mouth daily., Disp: , Rfl:  .  Flaxseed, Linseed, (FLAX SEED OIL) 1000 MG CAPS, Take 1,000 mg by mouth daily., Disp: , Rfl:  .  Fluticasone Furoate-Vilanterol 100-25 MCG/INH AEPB, Inhale 1 puff into the lungs daily., Disp: 2 each, Rfl: 0 .  ranitidine (ZANTAC) 300 MG tablet, Take 300 mg by mouth at bedtime. , Disp: , Rfl:  .   sertraline (ZOLOFT) 100 MG tablet, Take 50 mg by mouth daily. , Disp: , Rfl:  .  simvastatin (ZOCOR) 40 MG tablet, Take 40 mg by mouth every other day. , Disp: , Rfl:  .  tiotropium (SPIRIVA) 18 MCG inhalation capsule, Place 18 mcg into inhaler and inhale daily., Disp: , Rfl:   Allergies: No Known Allergies  Past Medical History, Surgical history, Social history, and Family History were reviewed and updated.  Review of Systems: As above  Physical Exam:  height is 6' (1.829 m) and weight is 225 lb (102.059 kg). His oral temperature is 97.5 F (36.4 C). His blood pressure is 148/72 and his pulse is 65. His respiration is 18.   Well-developed and well-nourished white general. Head and neck exam shows no ocular or oral lesions. He has no scleral icterus. He has no adenopathy in the neck. Lungs are clear. Cardiac exam regular rate and rhythm with no murmurs, rubs or bruits. Abdomen is soft. Has good bowel sounds. He's mildly obese. He has no fluid wave. There is no palpable liver or spleen tip. Back exam shows no tenderness over the spine, ribs or hips. Extremities shows no clubbing, cyanosis or edema. Skin exam no rashes, ecchymoses or petechia.  Lab Results  Component Value Date   WBC 3.4*  07/15/2014   HGB 10.0* 07/15/2014   HCT 30.8* 07/15/2014   MCV 116* 07/15/2014   PLT 231 07/15/2014     Chemistry      Component Value Date/Time   NA 139 07/15/2014 1311   NA 137 02/12/2014 1319   K 4.5 07/15/2014 1311   K 5.0 02/12/2014 1319   CL 99 07/15/2014 1311   CL 101 02/12/2014 1319   CO2 27 07/15/2014 1311   CO2 27 02/12/2014 1319   BUN 19 07/15/2014 1311   BUN 25* 02/12/2014 1319   CREATININE 1.2 07/15/2014 1311   CREATININE 1.72* 02/12/2014 1319      Component Value Date/Time   CALCIUM 8.5 07/15/2014 1311   CALCIUM 9.3 02/12/2014 1319   ALKPHOS 36 07/15/2014 1311   ALKPHOS 39 02/12/2014 1319   AST 24 07/15/2014 1311   AST 29 02/12/2014 1319   ALT 19 07/15/2014 1311   ALT 23  02/12/2014 1319   BILITOT 0.50 07/15/2014 1311   BILITOT 0.4 02/12/2014 1319         Impression and Plan: Anthony [name] is 74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 He has refractory anemia. He has normal cytogenetics. I would classify him as low risk. Overall, he's done pretty well with it. He has improved since we first saw him.  His blood smear does not show any evidence of transformation to a more acute process. His physical exam is pre-much unremarkable.  We will give him a dose of Aranesp today.  We will see what his iron studies show.  I just want to make him feel better. I want his quality of life to be more productive.  We will plan to get him back in another month and see how he is feeling.  I spent about 35 minutes with him today.. I went over his labs. We adjusted his medications.     Volanda Napoleon, MD 2/4/20162:27 PM

## 2014-07-15 NOTE — Patient Instructions (Signed)
Darbepoetin Alfa injection What is this medicine? DARBEPOETIN ALFA (dar be POE e tin AL fa) helps your body make more red blood cells. It is used to treat anemia caused by chronic kidney failure and chemotherapy. This medicine may be used for other purposes; ask your health care provider or pharmacist if you have questions. COMMON BRAND NAME(S): Aranesp What should I tell my health care provider before I take this medicine? They need to know if you have any of these conditions: -blood clotting disorders or history of blood clots -cancer patient not on chemotherapy -cystic fibrosis -heart disease, such as angina, heart failure, or a history of a heart attack -hemoglobin level of 12 g/dL or greater -high blood pressure -low levels of folate, iron, or vitamin B12 -seizures -an unusual or allergic reaction to darbepoetin, erythropoietin, albumin, hamster proteins, latex, other medicines, foods, dyes, or preservatives -pregnant or trying to get pregnant -breast-feeding How should I use this medicine? This medicine is for injection into a vein or under the skin. It is usually given by a health care professional in a hospital or clinic setting. If you get this medicine at home, you will be taught how to prepare and give this medicine. Do not shake the solution before you withdraw a dose. Use exactly as directed. Take your medicine at regular intervals. Do not take your medicine more often than directed. It is important that you put your used needles and syringes in a special sharps container. Do not put them in a trash can. If you do not have a sharps container, call your pharmacist or healthcare provider to get one. Talk to your pediatrician regarding the use of this medicine in children. While this medicine may be used in children as young as 1 year for selected conditions, precautions do apply. Overdosage: If you think you have taken too much of this medicine contact a poison control center or  emergency room at once. NOTE: This medicine is only for you. Do not share this medicine with others. What if I miss a dose? If you miss a dose, take it as soon as you can. If it is almost time for your next dose, take only that dose. Do not take double or extra doses. What may interact with this medicine? Do not take this medicine with any of the following medications: -epoetin alfa This list may not describe all possible interactions. Give your health care provider a list of all the medicines, herbs, non-prescription drugs, or dietary supplements you use. Also tell them if you smoke, drink alcohol, or use illegal drugs. Some items may interact with your medicine. What should I watch for while using this medicine? Visit your prescriber or health care professional for regular checks on your progress and for the needed blood tests and blood pressure measurements. It is especially important for the doctor to make sure your hemoglobin level is in the desired range, to limit the risk of potential side effects and to give you the best benefit. Keep all appointments for any recommended tests. Check your blood pressure as directed. Ask your doctor what your blood pressure should be and when you should contact him or her. As your body makes more red blood cells, you may need to take iron, folic acid, or vitamin B supplements. Ask your doctor or health care provider which products are right for you. If you have kidney disease continue dietary restrictions, even though this medication can make you feel better. Talk with your doctor or health   care professional about the foods you eat and the vitamins that you take. What side effects may I notice from receiving this medicine? Side effects that you should report to your doctor or health care professional as soon as possible: -allergic reactions like skin rash, itching or hives, swelling of the face, lips, or tongue -breathing problems -changes in vision -chest  pain -confusion, trouble speaking or understanding -feeling faint or lightheaded, falls -high blood pressure -muscle aches or pains -pain, swelling, warmth in the leg -rapid weight gain -severe headaches -sudden numbness or weakness of the face, arm or leg -trouble walking, dizziness, loss of balance or coordination -seizures (convulsions) -swelling of the ankles, feet, hands -unusually weak or tired Side effects that usually do not require medical attention (report to your doctor or health care professional if they continue or are bothersome): -diarrhea -fever, chills (flu-like symptoms) -headaches -nausea, vomiting -redness, stinging, or swelling at site where injected This list may not describe all possible side effects. Call your doctor for medical advice about side effects. You may report side effects to FDA at 1-800-FDA-1088. Where should I keep my medicine? Keep out of the reach of children. Store in a refrigerator between 2 and 8 degrees C (36 and 46 degrees F). Do not freeze. Do not shake. Throw away any unused portion if using a single-dose vial. Throw away any unused medicine after the expiration date. NOTE: This sheet is a summary. It may not cover all possible information. If you have questions about this medicine, talk to your doctor, pharmacist, or health care provider.  2015, [employer]. (2008-05-11 10:23:57)  

## 2014-07-16 LAB — IRON AND TIBC CHCC
%SAT: 26 % (ref 20–55)
Iron: 90 ug/dL (ref 42–163)
TIBC: 345 ug/dL (ref 202–409)
UIBC: 255 ug/dL (ref 117–376)

## 2014-07-16 LAB — FERRITIN CHCC: FERRITIN: 420 ng/mL — AB (ref 22–316)

## 2014-07-19 ENCOUNTER — Other Ambulatory Visit: Payer: Self-pay

## 2014-07-19 DIAGNOSIS — D509 Iron deficiency anemia, unspecified: Secondary | ICD-10-CM

## 2014-07-22 ENCOUNTER — Ambulatory Visit (HOSPITAL_BASED_OUTPATIENT_CLINIC_OR_DEPARTMENT_OTHER): Payer: Medicare Other

## 2014-07-22 VITALS — BP 127/60 | HR 70 | Temp 97.5°F | Resp 20

## 2014-07-22 DIAGNOSIS — D509 Iron deficiency anemia, unspecified: Secondary | ICD-10-CM

## 2014-07-22 MED ORDER — SODIUM CHLORIDE 0.9 % IV SOLN
510.0000 mg | Freq: Once | INTRAVENOUS | Status: AC
  Administered 2014-07-22: 510 mg via INTRAVENOUS
  Filled 2014-07-22: qty 17

## 2014-07-22 MED ORDER — SODIUM CHLORIDE 0.9 % IV SOLN
INTRAVENOUS | Status: DC
  Administered 2014-07-22: 11:00:00 via INTRAVENOUS

## 2014-07-22 NOTE — Patient Instructions (Signed)

## 2014-08-04 ENCOUNTER — Telehealth: Payer: Self-pay | Admitting: *Deleted

## 2014-08-04 NOTE — Telephone Encounter (Signed)
Patient not feeling well. Progressively weak. Does not want to wait until 08/11/14 to be seen. Rescheduled appointment for tomorrow with Judson Roch NP. Patient is happy with this plan.

## 2014-08-05 ENCOUNTER — Ambulatory Visit (HOSPITAL_BASED_OUTPATIENT_CLINIC_OR_DEPARTMENT_OTHER): Payer: Medicare Other | Admitting: Family

## 2014-08-05 ENCOUNTER — Ambulatory Visit (HOSPITAL_BASED_OUTPATIENT_CLINIC_OR_DEPARTMENT_OTHER): Payer: Medicare Other

## 2014-08-05 ENCOUNTER — Other Ambulatory Visit (HOSPITAL_BASED_OUTPATIENT_CLINIC_OR_DEPARTMENT_OTHER): Payer: Medicare Other | Admitting: Lab

## 2014-08-05 ENCOUNTER — Encounter: Payer: Self-pay | Admitting: Family

## 2014-08-05 VITALS — BP 130/64 | HR 79 | Temp 97.6°F | Resp 20 | Ht 72.0 in | Wt 221.0 lb

## 2014-08-05 DIAGNOSIS — D509 Iron deficiency anemia, unspecified: Secondary | ICD-10-CM

## 2014-08-05 DIAGNOSIS — D46Z Other myelodysplastic syndromes: Secondary | ICD-10-CM

## 2014-08-05 DIAGNOSIS — D4620 Refractory anemia with excess of blasts, unspecified: Secondary | ICD-10-CM

## 2014-08-05 LAB — CBC WITH DIFFERENTIAL (CANCER CENTER ONLY)
BASO#: 0 10*3/uL (ref 0.0–0.2)
BASO%: 0.6 % (ref 0.0–2.0)
EOS%: 1 % (ref 0.0–7.0)
Eosinophils Absolute: 0 10*3/uL (ref 0.0–0.5)
HCT: 32.7 % — ABNORMAL LOW (ref 38.7–49.9)
HEMOGLOBIN: 10.6 g/dL — AB (ref 13.0–17.1)
LYMPH#: 0.7 10*3/uL — ABNORMAL LOW (ref 0.9–3.3)
LYMPH%: 21.1 % (ref 14.0–48.0)
MCH: 38.3 pg — AB (ref 28.0–33.4)
MCHC: 32.4 g/dL (ref 32.0–35.9)
MCV: 118 fL — AB (ref 82–98)
MONO#: 1.2 10*3/uL — ABNORMAL HIGH (ref 0.1–0.9)
MONO%: 38.7 % — AB (ref 0.0–13.0)
NEUT%: 38.6 % — ABNORMAL LOW (ref 40.0–80.0)
NEUTROS ABS: 1.2 10*3/uL — AB (ref 1.5–6.5)
Platelets: 222 10*3/uL (ref 145–400)
RBC: 2.77 10*6/uL — AB (ref 4.20–5.70)
RDW: 14.9 % (ref 11.1–15.7)
WBC: 3.1 10*3/uL — AB (ref 4.0–10.0)

## 2014-08-05 LAB — IRON AND TIBC CHCC
%SAT: 38 % (ref 20–55)
Iron: 128 ug/dL (ref 42–163)
TIBC: 336 ug/dL (ref 202–409)
UIBC: 209 ug/dL (ref 117–376)

## 2014-08-05 LAB — CMP (CANCER CENTER ONLY)
ALT(SGPT): 22 U/L (ref 10–47)
AST: 27 U/L (ref 11–38)
Albumin: 3.5 g/dL (ref 3.3–5.5)
Alkaline Phosphatase: 37 U/L (ref 26–84)
BUN: 26 mg/dL — AB (ref 7–22)
CO2: 27 meq/L (ref 18–33)
Calcium: 9 mg/dL (ref 8.0–10.3)
Chloride: 101 mEq/L (ref 98–108)
Creat: 1.6 mg/dl — ABNORMAL HIGH (ref 0.6–1.2)
GLUCOSE: 96 mg/dL (ref 73–118)
Potassium: 4.3 mEq/L (ref 3.3–4.7)
Sodium: 143 mEq/L (ref 128–145)
Total Bilirubin: 0.6 mg/dl (ref 0.20–1.60)
Total Protein: 7.7 g/dL (ref 6.4–8.1)

## 2014-08-05 LAB — RETICULOCYTES (CHCC)
ABS RETIC: 54.5 10*3/uL (ref 19.0–186.0)
RBC.: 2.87 MIL/uL — ABNORMAL LOW (ref 4.22–5.81)
Retic Ct Pct: 1.9 % (ref 0.4–2.3)

## 2014-08-05 LAB — TECHNOLOGIST REVIEW CHCC SATELLITE

## 2014-08-05 LAB — LACTATE DEHYDROGENASE: LDH: 175 U/L (ref 94–250)

## 2014-08-05 LAB — FERRITIN CHCC: FERRITIN: 991 ng/mL — AB (ref 22–316)

## 2014-08-05 MED ORDER — DARBEPOETIN ALFA 500 MCG/ML IJ SOSY
400.0000 ug | PREFILLED_SYRINGE | Freq: Once | INTRAMUSCULAR | Status: AC
  Administered 2014-08-05: 400 ug via SUBCUTANEOUS

## 2014-08-05 MED ORDER — DARBEPOETIN ALFA 200 MCG/0.4ML IJ SOSY
PREFILLED_SYRINGE | INTRAMUSCULAR | Status: AC
  Filled 2014-08-05: qty 0.8

## 2014-08-05 NOTE — Progress Notes (Signed)
[place]  [phone] [phone]  ID: RILEY HALLUM OB: 25-Dec-1940 MR#: [id number] WEX#:[id number] Patient Care Team: Abigail Miyamoto, MD as PCP - General (Family Medicine)  DIAGNOSIS:  Macrocytic anemia and leukopenia  INTERVAL HISTORY: Mr. Pitcock is here today for a follow-up. He is feeling tired wanting to nap all the time and has had some dizziness while standing without falls  He received Fereheme and Aranesp earlier this month. His iron saturation at that time was 26%, ferritin was 420 and Hgb 10.  He has multiple health issues and is on a lot of medication.  He denies headaches, fever, chills, n/v, cough, rash, SOB, chest pain, palpitations, abdominal pain, constipation, diarrhea, problems urinating, blood in urine or stool.  No swelling, tenderness, numbness or tingling in his extremities.  His appetite is ok and he is drinking plenty of fluids. His weight is stable at 221 lbs.   CURRENT TREATMENT: Aranesp 400 mcg subcutaneous as needed for hemoglobin less than 11 Iron infusions as indicated  REVIEW OF SYSTEMS: All other 10 point review of systems is negative except for those issues mentioned above.  PAST MEDICAL HISTORY: Past Medical History  Diagnosis Date  . HTN (hypertension)   . Hyperlipidemia   . Coronary atherosclerosis of native coronary artery   . LPRD (laryngopharyngeal reflux disease)   . Depressive disorder   . 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 Iron deficiency anemia, unspecified 01/15/2014   PAST SURGICAL HISTORY: Past Surgical History  Procedure Laterality Date  . Angioplasty    . Tonsillectomy      as a child   FAMILY HISTORY Family History  Problem Relation Age of Onset  . Heart disease Father   . Hypertension Father   . Heart disease Mother    GYNECOLOGIC HISTORY:  No LMP for male patient.   SOCIAL HISTORY:  History   Social History  . Marital Status: Single    Spouse Name: N/A  . Number of  Children: 2  . Years of Education: N/A   Occupational History  . retired    Social History Main Topics  . Smoking status: Former Smoker -- 2.00 packs/day for 40 years    Types: Cigarettes    Start date: 08/05/1959    Quit date: 06/11/1998  . Smokeless tobacco: Never Used     Comment: quit smoking 15 years ago  . Alcohol Use: 0.0 oz/week    0 Standard drinks or equivalent per week     Comment: 4 cocktails qd  . Drug Use: No  . Sexual Activity: Not on file   Other Topics Concern  . Not on file   Social History Narrative   ADVANCED DIRECTIVES: <no information>  HEALTH MAINTENANCE: History  Substance Use Topics  . Smoking status: Former Smoker -- 2.00 packs/day for 40 years    Types: Cigarettes    Start date: 08/05/1959    Quit date: 06/11/1998  . Smokeless tobacco: Never Used     Comment: quit smoking 15 years ago  . Alcohol Use: 0.0 oz/week    0 Standard drinks or equivalent per week     Comment: 4 cocktails qd   Colonoscopy: PAP: Bone density: Lipid panel:  No Known Allergies  Current Outpatient Prescriptions  Medication Sig Dispense Refill  . Albuterol Sulfate (PROAIR RESPICLICK) 938 (90 BASE) MCG/ACT AEPB Inhale 2 puffs into the lungs every 6 (six) hours as needed.    Marland Kitchen aspirin 81 MG tablet Take 81 mg by mouth  daily.    . bimatoprost (LUMIGAN) 0.01 % SOLN Place 1 drop into both eyes at bedtime.    Marland Kitchen BYSTOLIC 5 MG tablet Take 5 mg by mouth daily.     . Cholecalciferol (VITAMIN D3) 2000 UNITS TABS Take 2,000 Units by mouth daily.    . clopidogrel (PLAVIX) 75 MG tablet Take 75 mg by mouth daily.     Marland Kitchen co-enzyme Q-10 30 MG capsule Take 100 mg by mouth daily.    . Cyanocobalamin (VITAMIN B-12) 2500 MCG TABS Take 5,000 mcg by mouth daily. 90 tablet 9  . fish oil-omega-3 fatty acids 1000 MG capsule Take 1 g by mouth daily.    . Flaxseed, Linseed, (FLAX SEED OIL) 1000 MG CAPS Take 1,000 mg by mouth daily.    . Fluticasone Furoate-Vilanterol 100-25 MCG/INH AEPB  Inhale 1 puff into the lungs daily. 2 each 0  . ranitidine (ZANTAC) 300 MG tablet Take 300 mg by mouth at bedtime.     . sertraline (ZOLOFT) 100 MG tablet Take 50 mg by mouth daily.     . simvastatin (ZOCOR) 40 MG tablet Take 40 mg by mouth every other day.     . tiotropium (SPIRIVA) 18 MCG inhalation capsule Place 18 mcg into inhaler and inhale daily.     No current facility-administered medications for this visit.   OBJECTIVE: There were no vitals filed for this visit. There is no weight on file to calculate BMI. ECOG FS:0 - Asymptomatic Ocular: Sclerae unicteric, pupils equal, round and reactive to light Ear-nose-throat: Oropharynx clear, dentition fair Lymphatic: No cervical or supraclavicular adenopathy Lungs no rales or rhonchi, good excursion bilaterally Heart regular rate and rhythm, no murmur appreciated Abd soft, nontender, positive bowel sounds MSK no focal spinal tenderness, no joint edema Neuro: non-focal, well-oriented, appropriate affect  LAB RESULTS: CMP     Component Value Date/Time   NA 139 07/15/2014 1311   NA 137 02/12/2014 1319   K 4.5 07/15/2014 1311   K 5.0 02/12/2014 1319   CL 99 07/15/2014 1311   CL 101 02/12/2014 1319   CO2 27 07/15/2014 1311   CO2 27 02/12/2014 1319   GLUCOSE 82 07/15/2014 1311   GLUCOSE 98 02/12/2014 1319   BUN 19 07/15/2014 1311   BUN 25* 02/12/2014 1319   CREATININE 1.2 07/15/2014 1311   CREATININE 1.72* 02/12/2014 1319   CALCIUM 8.5 07/15/2014 1311   CALCIUM 9.3 02/12/2014 1319   PROT 7.5 07/15/2014 1311   PROT 7.6 02/12/2014 1319   ALBUMIN 4.5 02/12/2014 1319   AST 24 07/15/2014 1311   AST 29 02/12/2014 1319   ALT 19 07/15/2014 1311   ALT 23 02/12/2014 1319   ALKPHOS 36 07/15/2014 1311   ALKPHOS 39 02/12/2014 1319   BILITOT 0.50 07/15/2014 1311   BILITOT 0.4 02/12/2014 1319   No results found for: SPEP Lab Results  Component Value Date   WBC 3.4* 07/15/2014   NEUTROABS 1.2* 07/15/2014   HGB 10.0* 07/15/2014    HCT 30.8* 07/15/2014   MCV 116* 07/15/2014   PLT 231 07/15/2014   No results found for: LABCA2 No components found for: [id number] No results for input(s): INR in the last 168 hours. Urinalysis No results found for: COLORURINE STUDIES: No results found.  ASSESSMENT/PLAN: Mr. Trinka is a very pleasant 74 yo male referred to [place] with macrocytic anemia and leukopenia. He had Fereheme and Aranesp earlier this month but is still feeling tired and having dizzy spells.  His Hgb today is  still 10 so we will give him Aranesp. We will see what his iron studies show. He is going to go see his PCP and see if there are any medications he can stop taking.  We will see him back in 3 weeks for labs and a follow-up.  All questions were answered. The patient knows to call the clinic with any problems, questions or concerns. We can certainly see the patient much sooner if necessary.  Eliezer Bottom, NP 08/05/2014 9:03 AM

## 2014-08-05 NOTE — Patient Instructions (Signed)
Darbepoetin Alfa injection What is this medicine? DARBEPOETIN ALFA (dar be POE e tin AL fa) helps your body make more red blood cells. It is used to treat anemia caused by chronic kidney failure and chemotherapy. This medicine may be used for other purposes; ask your health care provider or pharmacist if you have questions. COMMON BRAND NAME(S): Aranesp What should I tell my health care provider before I take this medicine? They need to know if you have any of these conditions: -blood clotting disorders or history of blood clots -cancer patient not on chemotherapy -cystic fibrosis -heart disease, such as angina, heart failure, or a history of a heart attack -hemoglobin level of 12 g/dL or greater -high blood pressure -low levels of folate, iron, or vitamin B12 -seizures -an unusual or allergic reaction to darbepoetin, erythropoietin, albumin, hamster proteins, latex, other medicines, foods, dyes, or preservatives -pregnant or trying to get pregnant -breast-feeding How should I use this medicine? This medicine is for injection into a vein or under the skin. It is usually given by a health care professional in a hospital or clinic setting. If you get this medicine at home, you will be taught how to prepare and give this medicine. Do not shake the solution before you withdraw a dose. Use exactly as directed. Take your medicine at regular intervals. Do not take your medicine more often than directed. It is important that you put your used needles and syringes in a special sharps container. Do not put them in a trash can. If you do not have a sharps container, call your pharmacist or healthcare provider to get one. Talk to your pediatrician regarding the use of this medicine in children. While this medicine may be used in children as young as 1 year for selected conditions, precautions do apply. Overdosage: If you think you have taken too much of this medicine contact a poison control center or  emergency room at once. NOTE: This medicine is only for you. Do not share this medicine with others. What if I miss a dose? If you miss a dose, take it as soon as you can. If it is almost time for your next dose, take only that dose. Do not take double or extra doses. What may interact with this medicine? Do not take this medicine with any of the following medications: -epoetin alfa This list may not describe all possible interactions. Give your health care provider a list of all the medicines, herbs, non-prescription drugs, or dietary supplements you use. Also tell them if you smoke, drink alcohol, or use illegal drugs. Some items may interact with your medicine. What should I watch for while using this medicine? Visit your prescriber or health care professional for regular checks on your progress and for the needed blood tests and blood pressure measurements. It is especially important for the doctor to make sure your hemoglobin level is in the desired range, to limit the risk of potential side effects and to give you the best benefit. Keep all appointments for any recommended tests. Check your blood pressure as directed. Ask your doctor what your blood pressure should be and when you should contact him or her. As your body makes more red blood cells, you may need to take iron, folic acid, or vitamin B supplements. Ask your doctor or health care provider which products are right for you. If you have kidney disease continue dietary restrictions, even though this medication can make you feel better. Talk with your doctor or health   care professional about the foods you eat and the vitamins that you take. What side effects may I notice from receiving this medicine? Side effects that you should report to your doctor or health care professional as soon as possible: -allergic reactions like skin rash, itching or hives, swelling of the face, lips, or tongue -breathing problems -changes in vision -chest  pain -confusion, trouble speaking or understanding -feeling faint or lightheaded, falls -high blood pressure -muscle aches or pains -pain, swelling, warmth in the leg -rapid weight gain -severe headaches -sudden numbness or weakness of the face, arm or leg -trouble walking, dizziness, loss of balance or coordination -seizures (convulsions) -swelling of the ankles, feet, hands -unusually weak or tired Side effects that usually do not require medical attention (report to your doctor or health care professional if they continue or are bothersome): -diarrhea -fever, chills (flu-like symptoms) -headaches -nausea, vomiting -redness, stinging, or swelling at site where injected This list may not describe all possible side effects. Call your doctor for medical advice about side effects. You may report side effects to FDA at 1-800-FDA-1088. Where should I keep my medicine? Keep out of the reach of children. Store in a refrigerator between 2 and 8 degrees C (36 and 46 degrees F). Do not freeze. Do not shake. Throw away any unused portion if using a single-dose vial. Throw away any unused medicine after the expiration date. NOTE: This sheet is a summary. It may not cover all possible information. If you have questions about this medicine, talk to your doctor, pharmacist, or health care provider.  2015, [employer]. (2008-05-11 10:23:57)  

## 2014-08-11 ENCOUNTER — Ambulatory Visit: Payer: Medicare Other

## 2014-08-11 ENCOUNTER — Ambulatory Visit: Payer: Medicare Other | Admitting: Hematology & Oncology

## 2014-08-11 ENCOUNTER — Other Ambulatory Visit: Payer: Medicare Other | Admitting: Lab

## 2014-08-26 ENCOUNTER — Ambulatory Visit (HOSPITAL_BASED_OUTPATIENT_CLINIC_OR_DEPARTMENT_OTHER): Payer: Medicare Other | Admitting: Hematology & Oncology

## 2014-08-26 ENCOUNTER — Encounter: Payer: Self-pay | Admitting: Hematology & Oncology

## 2014-08-26 ENCOUNTER — Ambulatory Visit (HOSPITAL_BASED_OUTPATIENT_CLINIC_OR_DEPARTMENT_OTHER): Payer: Medicare Other

## 2014-08-26 ENCOUNTER — Other Ambulatory Visit (HOSPITAL_BASED_OUTPATIENT_CLINIC_OR_DEPARTMENT_OTHER): Payer: Medicare Other | Admitting: Lab

## 2014-08-26 VITALS — BP 128/73 | HR 67 | Temp 97.5°F | Resp 18 | Ht 72.0 in | Wt 218.0 lb

## 2014-08-26 DIAGNOSIS — D509 Iron deficiency anemia, unspecified: Secondary | ICD-10-CM

## 2014-08-26 DIAGNOSIS — D464 Refractory anemia, unspecified: Secondary | ICD-10-CM

## 2014-08-26 DIAGNOSIS — D4620 Refractory anemia with excess of blasts, unspecified: Secondary | ICD-10-CM

## 2014-08-26 LAB — CMP (CANCER CENTER ONLY)
ALT: 18 U/L (ref 10–47)
AST: 27 U/L (ref 11–38)
Albumin: 3.5 g/dL (ref 3.3–5.5)
Alkaline Phosphatase: 42 U/L (ref 26–84)
BUN, Bld: 24 mg/dL — ABNORMAL HIGH (ref 7–22)
CHLORIDE: 100 meq/L (ref 98–108)
CO2: 30 mEq/L (ref 18–33)
Calcium: 9.6 mg/dL (ref 8.0–10.3)
Creat: 1.6 mg/dl — ABNORMAL HIGH (ref 0.6–1.2)
GLUCOSE: 89 mg/dL (ref 73–118)
Potassium: 4.6 mEq/L (ref 3.3–4.7)
Sodium: 146 mEq/L — ABNORMAL HIGH (ref 128–145)
TOTAL PROTEIN: 8.1 g/dL (ref 6.4–8.1)
Total Bilirubin: 0.7 mg/dl (ref 0.20–1.60)

## 2014-08-26 LAB — CBC WITH DIFFERENTIAL (CANCER CENTER ONLY)
BASO#: 0 10*3/uL (ref 0.0–0.2)
BASO%: 1 % (ref 0.0–2.0)
EOS%: 1.4 % (ref 0.0–7.0)
Eosinophils Absolute: 0 10*3/uL (ref 0.0–0.5)
HCT: 32.7 % — ABNORMAL LOW (ref 38.7–49.9)
HGB: 10.8 g/dL — ABNORMAL LOW (ref 13.0–17.1)
LYMPH#: 0.8 10*3/uL — ABNORMAL LOW (ref 0.9–3.3)
LYMPH%: 26.8 % (ref 14.0–48.0)
MCH: 38.3 pg — AB (ref 28.0–33.4)
MCHC: 33 g/dL (ref 32.0–35.9)
MCV: 116 fL — AB (ref 82–98)
MONO#: 1 10*3/uL — ABNORMAL HIGH (ref 0.1–0.9)
MONO%: 35.1 % — ABNORMAL HIGH (ref 0.0–13.0)
NEUT#: 1 10*3/uL — ABNORMAL LOW (ref 1.5–6.5)
NEUT%: 35.7 % — AB (ref 40.0–80.0)
Platelets: 218 10*3/uL (ref 145–400)
RBC: 2.82 10*6/uL — ABNORMAL LOW (ref 4.20–5.70)
RDW: 14.2 % (ref 11.1–15.7)
WBC: 2.9 10*3/uL — AB (ref 4.0–10.0)

## 2014-08-26 LAB — FERRITIN CHCC: FERRITIN: 616 ng/mL — AB (ref 22–316)

## 2014-08-26 LAB — LACTATE DEHYDROGENASE: LDH: 190 U/L (ref 94–250)

## 2014-08-26 LAB — CHCC SATELLITE - SMEAR

## 2014-08-26 LAB — IRON AND TIBC CHCC
%SAT: 39 % (ref 20–55)
Iron: 133 ug/dL (ref 42–163)
TIBC: 339 ug/dL (ref 202–409)
UIBC: 205 ug/dL (ref 117–376)

## 2014-08-26 LAB — RETICULOCYTES (CHCC)
ABS Retic: 45.8 10*3/uL (ref 19.0–186.0)
RBC.: 2.86 MIL/uL — AB (ref 4.22–5.81)
Retic Ct Pct: 1.6 % (ref 0.4–2.3)

## 2014-08-26 MED ORDER — DARBEPOETIN ALFA 200 MCG/0.4ML IJ SOSY
PREFILLED_SYRINGE | INTRAMUSCULAR | Status: AC
  Filled 2014-08-26: qty 0.8

## 2014-08-26 MED ORDER — DARBEPOETIN ALFA 500 MCG/ML IJ SOSY
400.0000 ug | PREFILLED_SYRINGE | Freq: Once | INTRAMUSCULAR | Status: AC
  Administered 2014-08-26: 400 ug via SUBCUTANEOUS

## 2014-08-26 NOTE — Patient Instructions (Signed)
Darbepoetin Alfa injection What is this medicine? DARBEPOETIN ALFA (dar be POE e tin AL fa) helps your body make more red blood cells. It is used to treat anemia caused by chronic kidney failure and chemotherapy. This medicine may be used for other purposes; ask your health care provider or pharmacist if you have questions. COMMON BRAND NAME(S): Aranesp What should I tell my health care provider before I take this medicine? They need to know if you have any of these conditions: -blood clotting disorders or history of blood clots -cancer patient not on chemotherapy -cystic fibrosis -heart disease, such as angina, heart failure, or a history of a heart attack -hemoglobin level of 12 g/dL or greater -high blood pressure -low levels of folate, iron, or vitamin B12 -seizures -an unusual or allergic reaction to darbepoetin, erythropoietin, albumin, hamster proteins, latex, other medicines, foods, dyes, or preservatives -pregnant or trying to get pregnant -breast-feeding How should I use this medicine? This medicine is for injection into a vein or under the skin. It is usually given by a health care professional in a hospital or clinic setting. If you get this medicine at home, you will be taught how to prepare and give this medicine. Do not shake the solution before you withdraw a dose. Use exactly as directed. Take your medicine at regular intervals. Do not take your medicine more often than directed. It is important that you put your used needles and syringes in a special sharps container. Do not put them in a trash can. If you do not have a sharps container, call your pharmacist or healthcare provider to get one. Talk to your pediatrician regarding the use of this medicine in children. While this medicine may be used in children as young as 1 year for selected conditions, precautions do apply. Overdosage: If you think you have taken too much of this medicine contact a poison control center or  emergency room at once. NOTE: This medicine is only for you. Do not share this medicine with others. What if I miss a dose? If you miss a dose, take it as soon as you can. If it is almost time for your next dose, take only that dose. Do not take double or extra doses. What may interact with this medicine? Do not take this medicine with any of the following medications: -epoetin alfa This list may not describe all possible interactions. Give your health care provider a list of all the medicines, herbs, non-prescription drugs, or dietary supplements you use. Also tell them if you smoke, drink alcohol, or use illegal drugs. Some items may interact with your medicine. What should I watch for while using this medicine? Visit your prescriber or health care professional for regular checks on your progress and for the needed blood tests and blood pressure measurements. It is especially important for the doctor to make sure your hemoglobin level is in the desired range, to limit the risk of potential side effects and to give you the best benefit. Keep all appointments for any recommended tests. Check your blood pressure as directed. Ask your doctor what your blood pressure should be and when you should contact him or her. As your body makes more red blood cells, you may need to take iron, folic acid, or vitamin B supplements. Ask your doctor or health care provider which products are right for you. If you have kidney disease continue dietary restrictions, even though this medication can make you feel better. Talk with your doctor or health   care professional about the foods you eat and the vitamins that you take. What side effects may I notice from receiving this medicine? Side effects that you should report to your doctor or health care professional as soon as possible: -allergic reactions like skin rash, itching or hives, swelling of the face, lips, or tongue -breathing problems -changes in vision -chest  pain -confusion, trouble speaking or understanding -feeling faint or lightheaded, falls -high blood pressure -muscle aches or pains -pain, swelling, warmth in the leg -rapid weight gain -severe headaches -sudden numbness or weakness of the face, arm or leg -trouble walking, dizziness, loss of balance or coordination -seizures (convulsions) -swelling of the ankles, feet, hands -unusually weak or tired Side effects that usually do not require medical attention (report to your doctor or health care professional if they continue or are bothersome): -diarrhea -fever, chills (flu-like symptoms) -headaches -nausea, vomiting -redness, stinging, or swelling at site where injected This list may not describe all possible side effects. Call your doctor for medical advice about side effects. You may report side effects to FDA at 1-800-FDA-1088. Where should I keep my medicine? Keep out of the reach of children. Store in a refrigerator between 2 and 8 degrees C (36 and 46 degrees F). Do not freeze. Do not shake. Throw away any unused portion if using a single-dose vial. Throw away any unused medicine after the expiration date. NOTE: This sheet is a summary. It may not cover all possible information. If you have questions about this medicine, talk to your doctor, pharmacist, or health care provider.  2015, [employer]. (2008-05-11 10:23:57)  

## 2014-08-26 NOTE — Progress Notes (Signed)
Hematology and Oncology Follow Up Visit  BABE CLENNEY [id number] July 10, 1940 74 y.o. 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  Mccravy is back for follow-up. He is feeling somewhat better.he does have moments where he just does not feel all that well. He thinks that there might be a component of depression. His family doctor has increased his antidepressant.  Hopefully, the increased Aranesp dose helped. His blood count slowly improving.  His last iron studies showed a ferritin 991. Iron saturation was 38%. Total iron was 128.. We have not had to give him iron for several months.  There's been no problems with bleeding. He's had no nausea or vomiting. He's had no change in bowel or bladder habits.  Overall, his quality of life is better. He is quite happy that he is able to do more. Medications:  Current outpatient prescriptions:  .  Albuterol Sulfate (PROAIR RESPICLICK) 350 (90 BASE) MCG/ACT AEPB, Inhale 2 puffs into the lungs every 6 (six) hours as needed., Disp: , Rfl:  .  aspirin 81 MG tablet, Take 81 mg by mouth daily., Disp: , Rfl:  .  bimatoprost (LUMIGAN) 0.01 % SOLN, Place 1 drop into both eyes at bedtime., Disp: , Rfl:  .  BYSTOLIC 5 MG tablet, Take 5 mg by mouth daily. , Disp: , Rfl:  .  Cholecalciferol (VITAMIN D3) 2000 UNITS TABS, Take 2,000 Units by mouth daily., Disp: , Rfl:  .  clopidogrel (PLAVIX) 75 MG tablet, Take 75 mg by mouth daily. , Disp: , Rfl:  .  co-enzyme Q-10 30 MG capsule, Take 100 mg by mouth daily., Disp: , Rfl:  .  Cyanocobalamin (VITAMIN B-12) 2500 MCG TABS, Take 5,000 mcg by mouth daily., Disp: 90 tablet, Rfl: 9 .  fish oil-omega-3 fatty acids 1000 MG capsule, Take 1 g by mouth daily., Disp: , Rfl:  .  Flaxseed, Linseed, (FLAX SEED OIL) 1000 MG CAPS, Take 1,000 mg by mouth daily., Disp: ,  Rfl:  .  Fluticasone Furoate-Vilanterol 100-25 MCG/INH AEPB, Inhale 1 puff into the lungs daily., Disp: 2 each, Rfl: 0 .  ranitidine (ZANTAC) 300 MG tablet, Take 300 mg by mouth at bedtime. , Disp: , Rfl:  .  simvastatin (ZOCOR) 40 MG tablet, Take 40 mg by mouth every other day. , Disp: , Rfl:  .  tiotropium (SPIRIVA) 18 MCG inhalation capsule, Place 18 mcg into inhaler and inhale daily., Disp: , Rfl:  .  venlafaxine XR (EFFEXOR-XR) 150 MG 24 hr capsule, Take 150 mg by mouth daily with breakfast., Disp: , Rfl:   Allergies: No Known Allergies  Past Medical History, Surgical history, Social history, and Family History were reviewed and updated.  Review of Systems: As above  Physical Exam:  height is 6' (1.829 m) and weight is 218 lb (98.884 kg). His oral temperature is 97.5 F (36.4 C). His blood pressure is 128/73 and his pulse is 67. His respiration is 18.   Well-developed and well-nourished white general. Head and neck exam shows no ocular or oral lesions. He has no scleral icterus. He has no adenopathy in the neck. Lungs are clear. Cardiac exam regular rate and rhythm with no murmurs, rubs or bruits. Abdomen is soft. Has good bowel sounds. He's mildly obese. He has no fluid wave. There is no palpable liver or  spleen tip. Back exam shows no tenderness over the spine, ribs or hips. Extremities shows no clubbing, cyanosis or edema. Skin exam no rashes, ecchymoses or petechia.  Lab Results  Component Value Date   WBC 2.9* 08/26/2014   HGB 10.8* 08/26/2014   HCT 32.7* 08/26/2014   MCV 116* 08/26/2014   PLT 218 08/26/2014     Chemistry      Component Value Date/Time   NA 146* 08/26/2014 1034   NA 137 02/12/2014 1319   K 4.6 08/26/2014 1034   K 5.0 02/12/2014 1319   CL 100 08/26/2014 1034   CL 101 02/12/2014 1319   CO2 30 08/26/2014 1034   CO2 27 02/12/2014 1319   BUN 24* 08/26/2014 1034   BUN 25* 02/12/2014 1319   CREATININE 1.6* 08/26/2014 1034   CREATININE 1.72* 02/12/2014  1319      Component Value Date/Time   CALCIUM 9.6 08/26/2014 1034   CALCIUM 9.3 02/12/2014 1319   ALKPHOS 42 08/26/2014 1034   ALKPHOS 39 02/12/2014 1319   AST 27 08/26/2014 1034   AST 29 02/12/2014 1319   ALT 18 08/26/2014 1034   ALT 23 02/12/2014 1319   BILITOT 0.70 08/26/2014 1034   BILITOT 0.4 02/12/2014 1319         Impression and Plan: Mr. Gallicchio is 74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 He has refractory anemia. He has normal cytogenetics. I would classify him as low risk. Overall, he's done pretty well with it. He has improved since we first saw him.  His blood smear does not show any evidence of transformation to a more acute process. His physical exam is pretty much unremarkable.  We will give him a dose of Aranesp today.  We will see what his iron studies show.  I just want to make him feel better. I want his quality of life to be more productive.  We will plan to get him back in another month and see how he is feeling.  I spent about 35 minutes with him today.. I went over his labs. We adjusted his medications.     Volanda Napoleon, MD 3/17/201612:43 PM

## 2014-09-02 ENCOUNTER — Other Ambulatory Visit: Payer: Self-pay | Admitting: Family

## 2014-09-08 ENCOUNTER — Encounter: Payer: Self-pay | Admitting: Interventional Cardiology

## 2014-09-08 ENCOUNTER — Ambulatory Visit (INDEPENDENT_AMBULATORY_CARE_PROVIDER_SITE_OTHER): Payer: Medicare Other | Admitting: Interventional Cardiology

## 2014-09-08 VITALS — BP 130/68 | HR 78 | Ht 72.0 in | Wt 220.0 lb

## 2014-09-08 DIAGNOSIS — I25758 Atherosclerosis of native coronary artery of transplanted heart with other forms of angina pectoris: Secondary | ICD-10-CM

## 2014-09-08 DIAGNOSIS — I951 Orthostatic hypotension: Secondary | ICD-10-CM

## 2014-09-08 DIAGNOSIS — I10 Essential (primary) hypertension: Secondary | ICD-10-CM

## 2014-09-08 DIAGNOSIS — E785 Hyperlipidemia, unspecified: Secondary | ICD-10-CM

## 2014-09-08 MED ORDER — NEBIVOLOL HCL 5 MG PO TABS: 2.5000 mg | ORAL_TABLET | Freq: Every day | ORAL | Status: DC

## 2014-09-08 NOTE — Patient Instructions (Addendum)
Your physician recommends that you schedule a follow-up appointment in: 4-6 weeks with Dr. Tamala Julian  Increase your fluid intake  Go to drugstore and get knee high support stockings  Your physician has recommended you make the following change in your medication:  1) Decrease Bystolic to 2.5 mg daily---please call the office and verify the dose [phone], If already on 2.5mg  daily will stop medication all together

## 2014-09-08 NOTE — Progress Notes (Signed)
Cardiology Office Note   Date:  09/08/2014   ID:  Anthony [name], DOB 1941/03/28, MRN [id number]  PCP:  Abigail Miyamoto, MD  Cardiologist:   Sinclair Grooms, MD   No chief complaint on file.     History of Present Illness: Anthony [name] is a 74 y.o. male who presents for follow-up of coronary artery disease. He has history of coronary stents. He has not had chest discomfort. Since I last saw him he has been diagnosed with a bone marrow problem. He is seeing Dr. Martha Clan. His major complaint is that of feeling weak. He gets dizzy when he stands. His gait has been unsteady. He denies orthopnea    Past Medical History  Diagnosis Date  . HTN (hypertension)   . Hyperlipidemia   . Coronary atherosclerosis of native coronary artery   . LPRD (laryngopharyngeal reflux disease)   . Depressive disorder   . 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 Iron deficiency anemia, unspecified 01/15/2014    Past Surgical History  Procedure Laterality Date  . Angioplasty    . Tonsillectomy      as a child     Current Outpatient Prescriptions  Medication Sig Dispense Refill  . Albuterol Sulfate (PROAIR RESPICLICK) 952 (90 BASE) MCG/ACT AEPB Inhale 2 puffs into the lungs every 6 (six) hours as needed.    Marland Kitchen aspirin 81 MG tablet Take 81 mg by mouth daily.    . bimatoprost (LUMIGAN) 0.01 % SOLN Place 1 drop into both eyes at bedtime.    . Cholecalciferol (VITAMIN D3) 2000 UNITS TABS Take 2,000 Units by mouth daily.    . clopidogrel (PLAVIX) 75 MG tablet Take 75 mg by mouth daily.     Marland Kitchen co-enzyme Q-10 30 MG capsule Take 100 mg by mouth daily.    . Cyanocobalamin (VITAMIN B-12) 2500 MCG TABS Take 5,000 mcg by mouth daily. 90 tablet 9  . fish oil-omega-3 fatty acids 1000 MG capsule Take 1 g by mouth daily.    . Flaxseed, Linseed, (FLAX SEED OIL) 1000 MG CAPS Take 1,000 mg by mouth daily.    . Fluticasone Furoate-Vilanterol 100-25 MCG/INH AEPB Inhale 1 puff into the lungs daily. 2 each 0  .  nebivolol (BYSTOLIC) 5 MG tablet Take 0.5 tablets (2.5 mg total) by mouth daily. 30 tablet   . ranitidine (ZANTAC) 300 MG tablet Take 300 mg by mouth at bedtime.     . simvastatin (ZOCOR) 40 MG tablet Take 40 mg by mouth every other day.     . tiotropium (SPIRIVA) 18 MCG inhalation capsule Place 18 mcg into inhaler and inhale daily.    Marland Kitchen venlafaxine XR (EFFEXOR-XR) 150 MG 24 hr capsule Take 150 mg by mouth daily with breakfast.     No current facility-administered medications for this visit.    Allergies:   Review of patient's allergies indicates no known allergies.    Social History:  The patient  reports that he quit smoking about 16 years ago. His smoking use included Cigarettes. He started smoking about 55 years ago. He has a 80 pack-year smoking history. He has never used smokeless tobacco. He reports that he drinks alcohol. He reports that he does not use illicit drugs.   Family History:  The patient's family history includes Heart disease in his father and mother; Hypertension in his father.    ROS:  Please see the history of present illness.   Otherwise, review of systems are positive for a blood  dyscrasia..   All other systems are reviewed and negative.    PHYSICAL EXAM: VS:  BP 130/68 mmHg  Pulse 78  Ht 6' (1.829 m)  Wt 220 lb (99.791 kg)  BMI 29.83 kg/m2 , BMI Body mass index is 29.83 kg/(m^2). GEN: Well nourished, well developed, in no acute distress HEENT: normal Neck: no JVD, carotid bruits, or masses Cardiac: RRR; no murmurs, rubs, or gallops,no edema  Respiratory:  clear to auscultation bilaterally, normal work of breathing GI: soft, nontender, nondistended, + BS MS: no deformity or atrophy Skin: warm and dry, no rash Neuro:  Strength and sensation are intact Psych: euthymic mood, full affect  Orthostatic blood pressures: Lying 120/60 mmHg; sitting 1:30/68 mmHg; standing 90/50 mmHg. There was a blunted increase in heart rate.   EKG:  EKG is ordered today. The  ekg ordered today demonstrates normal sinus rhythm   Recent Labs: 08/26/2014: ALT 18; BUN 24*; Creatinine 1.6*; Hemoglobin 10.8*; Platelets 218; Potassium 4.6; Sodium 146*    Lipid Panel No results found for: CHOL, TRIG, HDL, CHOLHDL, VLDL, LDLCALC, LDLDIRECT    Wt Readings from Last 3 Encounters:  09/08/14 220 lb (99.791 kg)  08/26/14 218 lb (98.884 kg)  08/05/14 221 lb (100.245 kg)      Other studies Reviewed: Additional studies/ records that were reviewed today include: Laboratory data with Dr Marin Olp Review of the above records demonstrates: Demonstrates renal insufficiency in both February and March with creatinine and BUN of 1.6 and 30.   ASSESSMENT AND PLAN:  Orthostasis documented: Possibly related to volume depletion. Rule out autonomic neuropathy.  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 Rule out dehydration     Current medicines are reviewed at length with the patient today.  The patient does not have concerns regarding medicines.  The following changes have been made:  We will decrease or discontinue Bystolic based upon whenever his current doses at home. If he is on 5 mg we'll decrease to 2.5 mg. He will increase fluid intake. He will wear moderate tension support stockings.  Labs/ tests ordered today include:   Orders Placed This Encounter  Procedures  . EKG 12-Lead     Disposition:   FU with H.Smith in 4 weeks   Signed, Sinclair Grooms, MD  09/08/2014 4:16 PM    [facility] [address], [place], [place]  [address] Phone: [phone]; Fax: [phone]

## 2014-09-09 ENCOUNTER — Telehealth: Payer: Self-pay | Admitting: Interventional Cardiology

## 2014-09-09 ENCOUNTER — Other Ambulatory Visit: Payer: Self-pay | Admitting: Interventional Cardiology

## 2014-09-09 MED ORDER — LOSARTAN POTASSIUM 25 MG PO TABS: 25.0000 mg | ORAL_TABLET | Freq: Every day | ORAL | Status: DC

## 2014-09-09 NOTE — Telephone Encounter (Signed)
Returned pt call.lmtcb is assistance is still needed  Pt med list update reflect losartan 25mg  qd  FYI fwd to Dr.Smith

## 2014-09-09 NOTE — Telephone Encounter (Signed)
New message    Patient calling in the office on yesterday .    One medication that was not listed. - losartan.  Which he currently taken 25 mg per day.    Pt C/O medication issue:  1. Name of Medication: bystolic  5 mg   2. How are you currently taking this medication (dosage and times per day)? Once  Day   3. Are you having a reaction (difficulty breathing--STAT)? No   4. What is your medication issue? MD recommended to cut / stop bystolic . Please advise on what to do since he taking losartan,.

## 2014-09-09 NOTE — Telephone Encounter (Signed)
New Message  Pt wife calling to confirm w/ Dr. Erskine Emery that pt is taking Bystolic 5 mg, also Losartin (was 50 mg) now 25 mg. Pt wanted to make sure that Dr. Tamala Julian knows this information and what to do going forward w/ Bystolic. Please call back and discuss.

## 2014-09-09 NOTE — Telephone Encounter (Signed)
Pt aware of Dr.Smith's instructions, reduce Bystolic to 2.5mg  daily. Losartan 25mg  daily has been updated on his med list. No other changes.pt verbalized understanding.

## 2014-09-23 ENCOUNTER — Other Ambulatory Visit (HOSPITAL_BASED_OUTPATIENT_CLINIC_OR_DEPARTMENT_OTHER): Payer: Medicare Other

## 2014-09-23 ENCOUNTER — Ambulatory Visit (HOSPITAL_BASED_OUTPATIENT_CLINIC_OR_DEPARTMENT_OTHER): Payer: Medicare Other | Admitting: Family

## 2014-09-23 ENCOUNTER — Encounter: Payer: Self-pay | Admitting: Family

## 2014-09-23 ENCOUNTER — Ambulatory Visit (HOSPITAL_BASED_OUTPATIENT_CLINIC_OR_DEPARTMENT_OTHER): Payer: Medicare Other

## 2014-09-23 DIAGNOSIS — D46Z Other myelodysplastic syndromes: Secondary | ICD-10-CM

## 2014-09-23 DIAGNOSIS — D4620 Refractory anemia with excess of blasts, unspecified: Secondary | ICD-10-CM

## 2014-09-23 DIAGNOSIS — D649 Anemia, unspecified: Secondary | ICD-10-CM

## 2014-09-23 DIAGNOSIS — D509 Iron deficiency anemia, unspecified: Secondary | ICD-10-CM

## 2014-09-23 LAB — CBC WITH DIFFERENTIAL (CANCER CENTER ONLY)
BASO#: 0 10*3/uL (ref 0.0–0.2)
BASO%: 0.3 % (ref 0.0–2.0)
EOS%: 0.7 % (ref 0.0–7.0)
Eosinophils Absolute: 0 10*3/uL (ref 0.0–0.5)
HCT: 29.8 % — ABNORMAL LOW (ref 38.7–49.9)
HEMOGLOBIN: 10 g/dL — AB (ref 13.0–17.1)
LYMPH#: 0.7 10*3/uL — ABNORMAL LOW (ref 0.9–3.3)
LYMPH%: 23.5 % (ref 14.0–48.0)
MCH: 38.9 pg — AB (ref 28.0–33.4)
MCHC: 33.6 g/dL (ref 32.0–35.9)
MCV: 116 fL — ABNORMAL HIGH (ref 82–98)
MONO#: 1 10*3/uL — ABNORMAL HIGH (ref 0.1–0.9)
MONO%: 33.3 % — AB (ref 0.0–13.0)
NEUT#: 1.2 10*3/uL — ABNORMAL LOW (ref 1.5–6.5)
NEUT%: 42.2 % (ref 40.0–80.0)
Platelets: 230 10*3/uL (ref 145–400)
RBC: 2.57 10*6/uL — ABNORMAL LOW (ref 4.20–5.70)
RDW: 14.3 % (ref 11.1–15.7)
WBC: 2.9 10*3/uL — ABNORMAL LOW (ref 4.0–10.0)

## 2014-09-23 LAB — CMP (CANCER CENTER ONLY)
ALK PHOS: 37 U/L (ref 26–84)
ALT(SGPT): 20 U/L (ref 10–47)
AST: 27 U/L (ref 11–38)
Albumin: 3.5 g/dL (ref 3.3–5.5)
BUN, Bld: 20 mg/dL (ref 7–22)
CO2: 27 mEq/L (ref 18–33)
Calcium: 8.6 mg/dL (ref 8.0–10.3)
Chloride: 102 mEq/L (ref 98–108)
Creat: 1.4 mg/dl — ABNORMAL HIGH (ref 0.6–1.2)
Glucose, Bld: 104 mg/dL (ref 73–118)
Potassium: 4.4 mEq/L (ref 3.3–4.7)
Sodium: 140 mEq/L (ref 128–145)
TOTAL PROTEIN: 7.7 g/dL (ref 6.4–8.1)
Total Bilirubin: 0.6 mg/dl (ref 0.20–1.60)

## 2014-09-23 LAB — FERRITIN: Ferritin: 528 ng/mL — ABNORMAL HIGH (ref 22–322)

## 2014-09-23 LAB — TECHNOLOGIST REVIEW CHCC SATELLITE

## 2014-09-23 LAB — IRON AND TIBC
%SAT: 32 % (ref 20–55)
Iron: 111 ug/dL (ref 42–165)
TIBC: 352 ug/dL (ref 215–435)
UIBC: 241 ug/dL (ref 125–400)

## 2014-09-23 LAB — CHCC SATELLITE - SMEAR

## 2014-09-23 MED ORDER — DARBEPOETIN ALFA 200 MCG/0.4ML IJ SOSY
PREFILLED_SYRINGE | INTRAMUSCULAR | Status: AC
  Filled 2014-09-23: qty 0.8

## 2014-09-23 MED ORDER — SODIUM CHLORIDE 0.9 % IV SOLN
510.0000 mg | Freq: Once | INTRAVENOUS | Status: AC
  Administered 2014-09-23: 510 mg via INTRAVENOUS
  Filled 2014-09-23: qty 17

## 2014-09-23 MED ORDER — DARBEPOETIN ALFA 500 MCG/ML IJ SOSY: 400.0000 ug | PREFILLED_SYRINGE | Freq: Once | INTRAMUSCULAR | Status: DC

## 2014-09-23 MED ORDER — DARBEPOETIN ALFA 300 MCG/0.6ML IJ SOSY
PREFILLED_SYRINGE | INTRAMUSCULAR | Status: AC
  Filled 2014-09-23: qty 0.6

## 2014-09-23 MED ORDER — DARBEPOETIN ALFA 500 MCG/ML IJ SOSY
400.0000 ug | PREFILLED_SYRINGE | Freq: Once | INTRAMUSCULAR | Status: AC
Start: 2014-09-23 — End: 2014-09-23
  Administered 2014-09-23: 400 ug via SUBCUTANEOUS

## 2014-09-23 NOTE — Patient Instructions (Signed)
Darbepoetin Alfa injection What is this medicine? DARBEPOETIN ALFA (dar be POE e tin AL fa) helps your body make more red blood cells. It is used to treat anemia caused by chronic kidney failure and chemotherapy. This medicine may be used for other purposes; ask your health care provider or pharmacist if you have questions. COMMON BRAND NAME(S): Aranesp What should I tell my health care provider before I take this medicine? They need to know if you have any of these conditions: -blood clotting disorders or history of blood clots -cancer patient not on chemotherapy -cystic fibrosis -heart disease, such as angina, heart failure, or a history of a heart attack -hemoglobin level of 12 g/dL or greater -high blood pressure -low levels of folate, iron, or vitamin B12 -seizures -an unusual or allergic reaction to darbepoetin, erythropoietin, albumin, hamster proteins, latex, other medicines, foods, dyes, or preservatives -pregnant or trying to get pregnant -breast-feeding How should I use this medicine? This medicine is for injection into a vein or under the skin. It is usually given by a health care professional in a hospital or clinic setting. If you get this medicine at home, you will be taught how to prepare and give this medicine. Do not shake the solution before you withdraw a dose. Use exactly as directed. Take your medicine at regular intervals. Do not take your medicine more often than directed. It is important that you put your used needles and syringes in a special sharps container. Do not put them in a trash can. If you do not have a sharps container, call your pharmacist or healthcare provider to get one. Talk to your pediatrician regarding the use of this medicine in children. While this medicine may be used in children as young as 1 year for selected conditions, precautions do apply. Overdosage: If you think you have taken too much of this medicine contact a poison control center or  emergency room at once. NOTE: This medicine is only for you. Do not share this medicine with others. What if I miss a dose? If you miss a dose, take it as soon as you can. If it is almost time for your next dose, take only that dose. Do not take double or extra doses. What may interact with this medicine? Do not take this medicine with any of the following medications: -epoetin alfa This list may not describe all possible interactions. Give your health care provider a list of all the medicines, herbs, non-prescription drugs, or dietary supplements you use. Also tell them if you smoke, drink alcohol, or use illegal drugs. Some items may interact with your medicine. What should I watch for while using this medicine? Visit your prescriber or health care professional for regular checks on your progress and for the needed blood tests and blood pressure measurements. It is especially important for the doctor to make sure your hemoglobin level is in the desired range, to limit the risk of potential side effects and to give you the best benefit. Keep all appointments for any recommended tests. Check your blood pressure as directed. Ask your doctor what your blood pressure should be and when you should contact him or her. As your body makes more red blood cells, you may need to take iron, folic acid, or vitamin B supplements. Ask your doctor or health care provider which products are right for you. If you have kidney disease continue dietary restrictions, even though this medication can make you feel better. Talk with your doctor or health   care professional about the foods you eat and the vitamins that you take. What side effects may I notice from receiving this medicine? Side effects that you should report to your doctor or health care professional as soon as possible: -allergic reactions like skin rash, itching or hives, swelling of the face, lips, or tongue -breathing problems -changes in vision -chest  pain -confusion, trouble speaking or understanding -feeling faint or lightheaded, falls -high blood pressure -muscle aches or pains -pain, swelling, warmth in the leg -rapid weight gain -severe headaches -sudden numbness or weakness of the face, arm or leg -trouble walking, dizziness, loss of balance or coordination -seizures (convulsions) -swelling of the ankles, feet, hands -unusually weak or tired Side effects that usually do not require medical attention (report to your doctor or health care professional if they continue or are bothersome): -diarrhea -fever, chills (flu-like symptoms) -headaches -nausea, vomiting -redness, stinging, or swelling at site where injected This list may not describe all possible side effects. Call your doctor for medical advice about side effects. You may report side effects to FDA at 1-800-FDA-1088. Where should I keep my medicine? Keep out of the reach of children. Store in a refrigerator between 2 and 8 degrees C (36 and 46 degrees F). Do not freeze. Do not shake. Throw away any unused portion if using a single-dose vial. Throw away any unused medicine after the expiration date. NOTE: This sheet is a summary. It may not cover all possible information. If you have questions about this medicine, talk to your doctor, pharmacist, or health care provider.  2015, Elsevier/Gold Standard. (2008-05-11 10:23:57)  Ferumoxytol injection What is this medicine? FERUMOXYTOL is an iron complex. Iron is used to make healthy red blood cells, which carry oxygen and nutrients throughout the body. This medicine is used to treat iron deficiency anemia in people with chronic kidney disease. This medicine may be used for other purposes; ask your health care provider or pharmacist if you have questions. COMMON BRAND NAME(S): Feraheme What should I tell my health care provider before I take this medicine? They need to know if you have any of these conditions: -anemia not  caused by low iron levels -high levels of iron in the blood -magnetic resonance imaging (MRI) test scheduled -an unusual or allergic reaction to iron, other medicines, foods, dyes, or preservatives -pregnant or trying to get pregnant -breast-feeding How should I use this medicine? This medicine is for injection into a vein. It is given by a health care professional in a hospital or clinic setting. Talk to your pediatrician regarding the use of this medicine in children. Special care may be needed. Overdosage: If you think you've taken too much of this medicine contact a poison control center or emergency room at once. Overdosage: If you think you have taken too much of this medicine contact a poison control center or emergency room at once. NOTE: This medicine is only for you. Do not share this medicine with others. What if I miss a dose? It is important not to miss your dose. Call your doctor or health care professional if you are unable to keep an appointment. What may interact with this medicine? This medicine may interact with the following medications: -other iron products This list may not describe all possible interactions. Give your health care provider a list of all the medicines, herbs, non-prescription drugs, or dietary supplements you use. Also tell them if you smoke, drink alcohol, or use illegal drugs. Some items may interact with your   medicine. What should I watch for while using this medicine? Visit your doctor or healthcare professional regularly. Tell your doctor or healthcare professional if your symptoms do not start to get better or if they get worse. You may need blood work done while you are taking this medicine. You may need to follow a special diet. Talk to your doctor. Foods that contain iron include: whole grains/cereals, dried fruits, beans, or peas, leafy green vegetables, and organ meats (liver, kidney). What side effects may I notice from receiving this  medicine? Side effects that you should report to your doctor or health care professional as soon as possible: -allergic reactions like skin rash, itching or hives, swelling of the face, lips, or tongue -breathing problems -changes in blood pressure -feeling faint or lightheaded, falls -fever or chills -flushing, sweating, or hot feelings -swelling of the ankles or feet Side effects that usually do not require medical attention (Report these to your doctor or health care professional if they continue or are bothersome.): -diarrhea -headache -nausea, vomiting -stomach pain This list may not describe all possible side effects. Call your doctor for medical advice about side effects. You may report side effects to FDA at 1-800-FDA-1088. Where should I keep my medicine? This drug is given in a hospital or clinic and will not be stored at home. NOTE: This sheet is a summary. It may not cover all possible information. If you have questions about this medicine, talk to your doctor, pharmacist, or health care provider.  2015, Elsevier/Gold Standard. (2012-01-11 15:23:36)  

## 2014-09-23 NOTE — Progress Notes (Signed)
Hematology and Oncology Follow Up Visit  Anthony [name] [id number] April 18, 1941 74 y.o. 09/23/2014   Principle Diagnosis:  Low-risk myelodysplasia  Current Therapy:   Aranesp 400 mcg subcutaneous as needed for hemoglobin less than 11 IV iron as indicated    Interim History:  Anthony [name] is here today for a follow-up. He is still feeling tired. He is having fewer dizzy spells now that his BP medication has been adjusted. He's had no falls.  He denies fever, chills, n/v, cough, rash, headache, SOB, chest pain, palpitations, abdominal pain, constipation, diarrhea, blood in urine or stool. No episodes of bleeding.  No swelling, tenderness, numbness or tingling in his extremities. No new aches or pains.  His appetite is good and he is staying hydrated. His weight is stable.  In March, His ferritin was 616 and iron saturation was 39%. He last received iron in February.   Medications:    Medication List       This list is accurate as of: 09/23/14 11:39 AM.  Always use your most recent med list.               aspirin 81 MG tablet  Take 81 mg by mouth daily.     bimatoprost 0.01 % Soln  Commonly known as:  LUMIGAN  Place 1 drop into both eyes at bedtime.     clopidogrel 75 MG tablet  Commonly known as:  PLAVIX  Take 75 mg by mouth daily.     co-enzyme Q-10 30 MG capsule  Take 100 mg by mouth daily.     Cyanocobalamin 2500 MCG Tabs  Take 5,000 mcg by mouth daily.     fish oil-omega-3 fatty acids 1000 MG capsule  Take 1 g by mouth daily.     Flax Seed Oil 1000 MG Caps  Take 1,000 mg by mouth daily.     Fluticasone Furoate-Vilanterol 100-25 MCG/INH Aepb  Inhale 1 puff into the lungs daily.     losartan 25 MG tablet  Commonly known as:  COZAAR  Take 1 tablet (25 mg total) by mouth daily.     nebivolol 5 MG tablet  Commonly known as:  BYSTOLIC  Take 0.5 tablets (2.5 mg total) by mouth daily.     PROAIR RESPICLICK 416 (90 BASE) MCG/ACT Aepb  Generic drug:  Albuterol  Sulfate  Inhale 2 puffs into the lungs every 6 (six) hours as needed.     ranitidine 300 MG tablet  Commonly known as:  ZANTAC  Take 300 mg by mouth at bedtime.     simvastatin 40 MG tablet  Commonly known as:  ZOCOR  Take 40 mg by mouth every other day.     tiotropium 18 MCG inhalation capsule  Commonly known as:  SPIRIVA  Place 18 mcg into inhaler and inhale daily.     venlafaxine XR 150 MG 24 hr capsule  Commonly known as:  EFFEXOR-XR  Take 150 mg by mouth daily with breakfast.     Vitamin D3 2000 UNITS Tabs  Take 2,000 Units by mouth daily.        Allergies: No Known Allergies  Past Medical History, Surgical history, Social history, and Family History were reviewed and updated.  Review of Systems: All other 10 point review of systems is negative.   Physical Exam:  vitals were not taken for this visit.  Wt Readings from Last 3 Encounters:  09/08/14 220 lb (99.791 kg)  08/26/14 218 lb (98.884 kg)  08/05/14 221 lb (100.245  kg)    Ocular: Sclerae unicteric, pupils equal, round and reactive to light Ear-nose-throat: Oropharynx clear, dentition fair Lymphatic: No cervical or supraclavicular adenopathy Lungs no rales or rhonchi, good excursion bilaterally Heart regular rate and rhythm, no murmur appreciated Abd soft, nontender, positive bowel sounds MSK no focal spinal tenderness, no joint edema Neuro: non-focal, well-oriented, appropriate affect  Lab Results  Component Value Date   WBC 2.9* 08/26/2014   HGB 10.8* 08/26/2014   HCT 32.7* 08/26/2014   MCV 116* 08/26/2014   PLT 218 08/26/2014   Lab Results  Component Value Date   FERRITIN 616* 08/26/2014   IRON 133 08/26/2014   TIBC 339 08/26/2014   UIBC 205 08/26/2014   IRONPCTSAT 39 08/26/2014   Lab Results  Component Value Date   RETICCTPCT 1.6 08/26/2014   RBC 2.86* 08/26/2014   RETICCTABS 45.8 08/26/2014   No results found for: KPAFRELGTCHN, LAMBDASER, KAPLAMBRATIO No results found for:  Kandis Cocking, IGMSERUM Lab Results  Component Value Date   TOTALPROTELP 7.3 01/01/2014   ALBUMINELP 55.1* 01/01/2014   A1GS 6.9* 01/01/2014   A2GS 8.9 01/01/2014   BETS 7.9* 01/01/2014   BETA2SER 6.7* 01/01/2014   GAMS 14.5 01/01/2014   MSPIKE NOT DET 01/01/2014   SPEI * 01/01/2014     Chemistry      Component Value Date/Time   NA 146* 08/26/2014 1034   NA 137 02/12/2014 1319   K 4.6 08/26/2014 1034   K 5.0 02/12/2014 1319   CL 100 08/26/2014 1034   CL 101 02/12/2014 1319   CO2 30 08/26/2014 1034   CO2 27 02/12/2014 1319   BUN 24* 08/26/2014 1034   BUN 25* 02/12/2014 1319   CREATININE 1.6* 08/26/2014 1034   CREATININE 1.72* 02/12/2014 1319      Component Value Date/Time   CALCIUM 9.6 08/26/2014 1034   CALCIUM 9.3 02/12/2014 1319   ALKPHOS 42 08/26/2014 1034   ALKPHOS 39 02/12/2014 1319   AST 27 08/26/2014 1034   AST 29 02/12/2014 1319   ALT 18 08/26/2014 1034   ALT 23 02/12/2014 1319   BILITOT 0.70 08/26/2014 1034   BILITOT 0.4 02/12/2014 1319     Impression and Plan: Anthony [name] is 74 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 He has normal cytogenetics. He is feeling better but still experiencing some fatigue.  His Hgb today is 10.0. We will see what his iron studies show.  We will give him Aranesp and also a dose of Feraheme today. Hopefully this will help with his energy.  We will plan to see him back in 5 weeks for labs and follow-up.  He knows to call here with any questions or concerns. We can certainly see him sooner if need be.   Anthony Bottom, NP 4/14/201611:39 AM

## 2014-09-27 ENCOUNTER — Telehealth: Payer: Self-pay | Admitting: Pulmonary Disease

## 2014-09-27 NOTE — Telephone Encounter (Signed)
Pt never let us know that his med didn't help.  He needs Ov since he is sick and determine best maintenance regimen for him.

## 2014-09-27 NOTE — Telephone Encounter (Signed)
Spoke with pt, scheduled for Friday at 12:00 with [facility] as this was the next available pt could make.  Nothing further needed.

## 2014-09-27 NOTE — Telephone Encounter (Signed)
Spoke with pt, c/o increased labored breathing with exertion X6-8 weeks.   Denies chest tightness, chest pain.   S/s improve with resting and using rescue inhaler.  Pt doesn't have any nebulizer.  Pt tried breo after last visit with [facility], did not help breathing at all and has d/c'ed this.  Pt uses [facility] in [place].  [place] please advise on recs.  Thanks!

## 2014-10-01 ENCOUNTER — Ambulatory Visit (INDEPENDENT_AMBULATORY_CARE_PROVIDER_SITE_OTHER): Payer: Medicare Other | Admitting: Pulmonary Disease

## 2014-10-01 ENCOUNTER — Ambulatory Visit (INDEPENDENT_AMBULATORY_CARE_PROVIDER_SITE_OTHER)
Admission: RE | Admit: 2014-10-01 | Discharge: 2014-10-01 | Disposition: A | Discharge status: Home / Self Care | Payer: Medicare Other | Source: Ambulatory Visit | Attending: Pulmonary Disease | Admitting: Pulmonary Disease

## 2014-10-01 ENCOUNTER — Encounter: Payer: Self-pay | Admitting: Pulmonary Disease

## 2014-10-01 VITALS — BP 122/60 | HR 70 | Temp 97.0°F | Ht 72.0 in | Wt 221.8 lb

## 2014-10-01 DIAGNOSIS — J438 Other emphysema: Secondary | ICD-10-CM | POA: Diagnosis not present

## 2014-10-01 DIAGNOSIS — R0600 Dyspnea, unspecified: Secondary | ICD-10-CM

## 2014-10-01 NOTE — Assessment & Plan Note (Signed)
The patient has known moderate COPD, but feels that his breathing has worsened since his last visit. We did follow-up spirometry today, and his flows are actually a little better than 2013. We also did ambulatory oximetry, and he did not desaturate below 92%. He has a history of myelodysplasia, but her recent hemoglobin was 10. He has a history of coronary disease, but had a low risk myocardial perfusion imaging study in April 2015. He recently followed up with cardiology where he was found to have orthostasis, and his blood pressure medicine is being adjusted. At this point, I suspect that his worsening shortness of breath does not have anything to do with his known COPD. He is actually lost 4 pounds since the last visit, but he is not doing any type of exercise program. I discussed with him participating in pulmonary rehabilitation, but he lives 30 minutes away from the closest facility. He would like to try to do this on his own at the [facility]. He did not see any difference with Breo added to his Spiriva, but I will try him on Symbicort for the next 6 weeks. I will see the patient back in 6 weeks to see how he is doing on the new medication and after working on increasing his conditioning. If he is continuing to have significant dyspnea on exertion, we may need to consider evaluating for occult thromboembolic disease, or possibly worsening coronary disease.

## 2014-10-01 NOTE — Progress Notes (Signed)
   Subjective:    Patient ID: Anthony [name], male    DOB: 21-Dec-1940, 74 y.o.   MRN: [id number]  HPI The patient comes in today for follow-up of his known moderate COPD. He has maintained on Spiriva, and was started on Breo at the last visit. Unfortunately, he has not seen an improvement in his breathing, and rather has seen worsening of his breathing over the last few months. He denies any chest discomfort, and has had no worsening wheezing. He has a mild cough with small amounts of mucus production, but it is not consistent. He has not had spirometry in a while, and his last x-ray was in 2014 which showed only changes of COPD. He does have a history of myelodysplasia, but his recent hemoglobin is adequate at 10. He has a history of coronary disease, but did have a myocardial perfusion imaging study last year that was considered low risk. I do not see a recent echocardiogram in the computer. He has not had any worsening lower extremity edema or pleuritic chest pain.  His weight is actually down 4 pounds from the last visit.  He does tell me that he recently saw his cardiologist and was found to have orthostasis, and his medications are being adjusted.   Review of Systems  Constitutional: Negative for fever and unexpected weight change.  HENT: Positive for congestion and postnasal drip. Negative for dental problem, ear pain, nosebleeds, rhinorrhea, sinus pressure, sneezing, sore throat and trouble swallowing.   Eyes: Negative for redness and itching.  Respiratory: Positive for cough, shortness of breath and wheezing. Negative for chest tightness.   Cardiovascular: Positive for leg swelling. Negative for palpitations.  Gastrointestinal: Negative for nausea and vomiting.  Genitourinary: Negative for dysuria.  Musculoskeletal: Negative for joint swelling.  Skin: Negative for rash.  Neurological: Negative for headaches.  Hematological: Does not bruise/bleed easily.  Psychiatric/Behavioral: Negative  for dysphoric mood. The patient is not nervous/anxious.        Objective:   Physical Exam Obese male in no acute distress Nose without purulence or discharge noted Neck without lymphadenopathy or thyromegaly Chest with mild decrease in breath sounds, no active wheezes or rhonchi Cardiac exam with regular rate and rhythm Lower extremities without significant edema, no cyanosis Alert and oriented, moves all 4 extremities.       Assessment & Plan:

## 2014-10-01 NOTE — Patient Instructions (Signed)
Stay on spiriva Start on symbicort 160, 2 inhalations am and pm everyday no matter what WITH SPACER, until next visit.  Keep mouth rinsed. Work on exercise program at [facility] for next 6 weeks, but consider [facility] rehab at [facility] Will check chest xray today, and call you with results. Will see you back in 6 weeks to see how things are going.

## 2014-10-12 ENCOUNTER — Ambulatory Visit (INDEPENDENT_AMBULATORY_CARE_PROVIDER_SITE_OTHER): Payer: Medicare Other | Admitting: Interventional Cardiology

## 2014-10-12 ENCOUNTER — Encounter: Payer: Self-pay | Admitting: Interventional Cardiology

## 2014-10-12 VITALS — BP 124/64 | HR 73 | Ht 72.0 in | Wt 219.8 lb

## 2014-10-12 DIAGNOSIS — I2510 Atherosclerotic heart disease of native coronary artery without angina pectoris: Secondary | ICD-10-CM | POA: Diagnosis not present

## 2014-10-12 DIAGNOSIS — J410 Simple chronic bronchitis: Secondary | ICD-10-CM | POA: Diagnosis not present

## 2014-10-12 DIAGNOSIS — I951 Orthostatic hypotension: Secondary | ICD-10-CM

## 2014-10-12 DIAGNOSIS — I10 Essential (primary) hypertension: Secondary | ICD-10-CM | POA: Diagnosis not present

## 2014-10-12 DIAGNOSIS — R0600 Dyspnea, unspecified: Secondary | ICD-10-CM

## 2014-10-12 NOTE — Patient Instructions (Signed)
Medication Instructions:  Your physician has recommended you make the following change in your medication:  STOP Bystolic  Labwork: None   Testing/Procedures: None   Follow-Up: Your physician wants you to follow-up in: 1 year with Dr.Smith You will receive a reminder letter in the mail two months in advance. If you don't receive a letter, please call our office to schedule the follow-up appointment.   Any Other Special Instructions Will Be Listed Below (If Applicable). Measure your blood pressure regular at home for the next month. Call the office if your bp is consistently over 140/90

## 2014-10-12 NOTE — Progress Notes (Signed)
Cardiology Office Note   Date:  10/12/2014   ID:  Anthony [name], DOB 02-Jan-1941, MRN [id number]  PCP:  Abigail Miyamoto, MD  Cardiologist:   Sinclair Grooms, MD   Chief Complaint  Patient presents with  . Coronary Artery Disease  . Allergies    seasonal      History of Present Illness: Anthony [name] is a 74 y.o. male who presents for CAD, hypertension, and hyperlipidemia.  On the last office visit notes orthostatic lightheadedness and dizziness. We decreased his antihypertensive therapy and he has had dramatic improvement in the way he feels. He denies angina and dyspnea.    Past Medical History  Diagnosis Date  . HTN (hypertension)   . Hyperlipidemia   . Coronary atherosclerosis of native coronary artery   . LPRD (laryngopharyngeal reflux disease)   . Depressive disorder   . 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 Iron deficiency anemia, unspecified 01/15/2014    Past Surgical History  Procedure Laterality Date  . Angioplasty    . Tonsillectomy      as a child     Current Outpatient Prescriptions  Medication Sig Dispense Refill  . Albuterol Sulfate (PROAIR RESPICLICK) 378 (90 BASE) MCG/ACT AEPB Inhale 2 puffs into the lungs every 6 (six) hours as needed.    Marland Kitchen aspirin 81 MG tablet Take 81 mg by mouth daily.    . bimatoprost (LUMIGAN) 0.01 % SOLN Place 1 drop into both eyes at bedtime.    . budesonide-formoterol (SYMBICORT) 160-4.5 MCG/ACT inhaler Inhale 2 puffs into the lungs 2 (two) times daily.    . Cholecalciferol (VITAMIN D3) 5000 UNITS TABS Take by mouth daily.    . clopidogrel (PLAVIX) 75 MG tablet Take 75 mg by mouth daily.     Marland Kitchen co-enzyme Q-10 30 MG capsule Take 100 mg by mouth daily.    . Cyanocobalamin (VITAMIN B-12) 2500 MCG TABS Take 5,000 mcg by mouth daily. 90 tablet 9  . fish oil-omega-3 fatty acids 1000 MG capsule Take 1 g by mouth daily.    . Flaxseed, Linseed, (FLAX SEED OIL) 1000 MG CAPS Take 1,000 mg by mouth daily.    . Fluticasone  Furoate-Vilanterol 100-25 MCG/INH AEPB Inhale 1 puff into the lungs daily. 2 each 0  . losartan (COZAAR) 25 MG tablet Take 12.5 mg by mouth daily.    . nebivolol (BYSTOLIC) 5 MG tablet Take 0.5 tablets (2.5 mg total) by mouth daily. 30 tablet   . ranitidine (ZANTAC) 300 MG tablet Take 300 mg by mouth at bedtime.     . sertraline (ZOLOFT) 50 MG tablet Take 50 mg by mouth at bedtime.    . simvastatin (ZOCOR) 40 MG tablet Take 40 mg by mouth every other day.     . tiotropium (SPIRIVA) 18 MCG inhalation capsule Place 18 mcg into inhaler and inhale daily.     No current facility-administered medications for this visit.    Allergies:   Review of patient's allergies indicates no known allergies.    Social History:  The patient  reports that he quit smoking about 16 years ago. His smoking use included Cigarettes. He started smoking about 55 years ago. He has a 80 pack-year smoking history. He has never used smokeless tobacco. He reports that he drinks alcohol. He reports that he does not use illicit drugs.   Family History:  The patient's family history includes Heart disease in his father and mother; Hypertension in his father.  ROS:  Please see the history of present illness.   Otherwise, review of systems are positive for easy bruising and shortness of breath related to lung disease..   All other systems are reviewed and negative.    PHYSICAL EXAM: VS:  BP 124/64 mmHg  Pulse 73  Ht 6' (1.829 m)  Wt 219 lb 12.8 oz (99.701 kg)  BMI 29.80 kg/m2  SpO2 93% , BMI Body mass index is 29.8 kg/(m^2). GEN: Well nourished, well developed, in no acute distress HEENT: normal Neck: no JVD, carotid bruits, or masses Cardiac: RRR; no murmurs, rubs, or gallops,no edema  Respiratory:  clear to auscultation bilaterally, normal work of breathing GI: soft, nontender, nondistended, + BS MS: no deformity or atrophy Skin: warm and dry, no rash Neuro:  Strength and sensation are intact Psych: euthymic mood,  full affect   EKG:  EKG is not ordered today.   Recent Labs: 09/23/2014: ALT 20; BUN 20; Creatinine 1.4*; Hemoglobin 10.0*; Platelets 230; Potassium 4.4; Sodium 140    Lipid Panel No results found for: CHOL, TRIG, HDL, CHOLHDL, VLDL, LDLCALC, LDLDIRECT    Wt Readings from Last 3 Encounters:  10/12/14 219 lb 12.8 oz (99.701 kg)  10/01/14 221 lb 12.8 oz (100.608 kg)  09/23/14 219 lb (99.338 kg)      Other studies Reviewed: Additional studies/ records that were reviewed today include: .   ASSESSMENT AND PLAN:  Coronary artery disease involving native coronary artery of native heart without angina pectoris  Essential hypertension: Low normal  Orthostasis: Resolved. I'll go ahead and discontinue Bystolic     Current medicines are reviewed at length with the patient today.  The patient does not have concerns regarding medicines.  The following changes have been made:  no change.  I've asked the patient to monitor his blood pressure over the next month and he should call with results. If he is consistently running above 140/90, we will increase the losartan back to 25 mg per day. It appears that we can do without beta blocker therapy. He should continue with support stockings.  Labs/ tests ordered today include:  No orders of the defined types were placed in this encounter.     Disposition:   FU with HS in 1 year  Signed, Sinclair Grooms, MD  10/12/2014 12:38 PM    [address], [place], [place]  [address] Phone: [phone]; Fax: [phone]

## 2014-10-26 ENCOUNTER — Encounter: Payer: Self-pay | Admitting: Hematology & Oncology

## 2014-10-26 ENCOUNTER — Ambulatory Visit (HOSPITAL_BASED_OUTPATIENT_CLINIC_OR_DEPARTMENT_OTHER): Payer: Medicare Other

## 2014-10-26 ENCOUNTER — Ambulatory Visit (HOSPITAL_BASED_OUTPATIENT_CLINIC_OR_DEPARTMENT_OTHER): Payer: Medicare Other | Admitting: Hematology & Oncology

## 2014-10-26 ENCOUNTER — Other Ambulatory Visit (HOSPITAL_BASED_OUTPATIENT_CLINIC_OR_DEPARTMENT_OTHER): Payer: Medicare Other

## 2014-10-26 VITALS — BP 135/66 | HR 93 | Temp 97.7°F | Resp 20 | Ht 72.0 in | Wt 223.0 lb

## 2014-10-26 DIAGNOSIS — D649 Anemia, unspecified: Secondary | ICD-10-CM

## 2014-10-26 DIAGNOSIS — D509 Iron deficiency anemia, unspecified: Secondary | ICD-10-CM | POA: Diagnosis not present

## 2014-10-26 DIAGNOSIS — D464 Refractory anemia, unspecified: Secondary | ICD-10-CM

## 2014-10-26 DIAGNOSIS — D4620 Refractory anemia with excess of blasts, unspecified: Secondary | ICD-10-CM

## 2014-10-26 DIAGNOSIS — D46Z Other myelodysplastic syndromes: Secondary | ICD-10-CM

## 2014-10-26 LAB — COMPREHENSIVE METABOLIC PANEL
ALT: 15 U/L (ref 0–53)
AST: 19 U/L (ref 0–37)
Albumin: 4 g/dL (ref 3.5–5.2)
Alkaline Phosphatase: 36 U/L — ABNORMAL LOW (ref 39–117)
BILIRUBIN TOTAL: 0.4 mg/dL (ref 0.2–1.2)
BUN: 24 mg/dL — ABNORMAL HIGH (ref 6–23)
CO2: 24 mEq/L (ref 19–32)
Calcium: 8.8 mg/dL (ref 8.4–10.5)
Chloride: 103 mEq/L (ref 96–112)
Creatinine, Ser: 1.44 mg/dL — ABNORMAL HIGH (ref 0.50–1.35)
GLUCOSE: 91 mg/dL (ref 70–99)
Potassium: 4.4 mEq/L (ref 3.5–5.3)
Sodium: 140 mEq/L (ref 135–145)
Total Protein: 7.3 g/dL (ref 6.0–8.3)

## 2014-10-26 LAB — CBC WITH DIFFERENTIAL (CANCER CENTER ONLY)
BASO#: 0 10*3/uL (ref 0.0–0.2)
BASO%: 0.5 % (ref 0.0–2.0)
EOS%: 0.8 % (ref 0.0–7.0)
Eosinophils Absolute: 0 10*3/uL (ref 0.0–0.5)
HCT: 30.2 % — ABNORMAL LOW (ref 38.7–49.9)
HGB: 10.1 g/dL — ABNORMAL LOW (ref 13.0–17.1)
LYMPH#: 0.9 10*3/uL (ref 0.9–3.3)
LYMPH%: 23.5 % (ref 14.0–48.0)
MCH: 39.5 pg — AB (ref 28.0–33.4)
MCHC: 33.4 g/dL (ref 32.0–35.9)
MCV: 118 fL — ABNORMAL HIGH (ref 82–98)
MONO#: 1.3 10*3/uL — ABNORMAL HIGH (ref 0.1–0.9)
MONO%: 33.9 % — AB (ref 0.0–13.0)
NEUT%: 41.3 % (ref 40.0–80.0)
NEUTROS ABS: 1.6 10*3/uL (ref 1.5–6.5)
PLATELETS: 192 10*3/uL (ref 145–400)
RBC: 2.56 10*6/uL — ABNORMAL LOW (ref 4.20–5.70)
RDW: 14.9 % (ref 11.1–15.7)
WBC: 3.9 10*3/uL — ABNORMAL LOW (ref 4.0–10.0)

## 2014-10-26 LAB — TECHNOLOGIST REVIEW CHCC SATELLITE

## 2014-10-26 LAB — CHCC SATELLITE - SMEAR

## 2014-10-26 MED ORDER — DARBEPOETIN ALFA 200 MCG/0.4ML IJ SOSY
PREFILLED_SYRINGE | INTRAMUSCULAR | Status: AC
  Filled 2014-10-26: qty 0.8

## 2014-10-26 MED ORDER — DARBEPOETIN ALFA 500 MCG/ML IJ SOSY
400.0000 ug | PREFILLED_SYRINGE | Freq: Once | INTRAMUSCULAR | Status: AC
  Administered 2014-10-26: 400 ug via SUBCUTANEOUS

## 2014-10-26 NOTE — Progress Notes (Signed)
Hematology and Oncology Follow Up Visit  Anthony [name] [id number] July 03, 1940 74 y.o. 10/26/2014   Principle Diagnosis:   Low-risk myelodysplasia  Current Therapy:    Aranesp 400 mcg subcutaneous as needed for hemoglobin less than 11  IV iron as indicated     Interim History:  Anthony [name] is back for follow-up. He is feeling somewhat better.his cardiologist when had stopped a couple of his blood pressure medications. The Bystolic was discontinued. Not sure which other one was stopped. He's had about a week later, he really felt better.  He's had no problems with bleeding. He's had no bruising. He's had no fever. There's been no change in bowel or bladder habits.  His last iron studies back in April showed a ferritin of 528 with iron saturation of 32%. His ferritin elevation is more so in April byproduct of inflammatory issues.  Overall, his quality of life is better. He is quite happy that he is able to do more.  He just got back from the beach. He he will not be going back until October.  Overall, his performance status is ECOG 1. Medications:  Current outpatient prescriptions:  .  Albuterol Sulfate (PROAIR RESPICLICK) 409 (90 BASE) MCG/ACT AEPB, Inhale 2 puffs into the lungs every 6 (six) hours as needed., Disp: , Rfl:  .  aspirin 81 MG tablet, Take 81 mg by mouth daily., Disp: , Rfl:  .  bimatoprost (LUMIGAN) 0.01 % SOLN, Place 1 drop into both eyes at bedtime., Disp: , Rfl:  .  budesonide-formoterol (SYMBICORT) 160-4.5 MCG/ACT inhaler, Inhale 2 puffs into the lungs 2 (two) times daily., Disp: , Rfl:  .  Cholecalciferol (VITAMIN D3) 5000 UNITS TABS, Take by mouth daily., Disp: , Rfl:  .  clopidogrel (PLAVIX) 75 MG tablet, Take 75 mg by mouth daily. , Disp: , Rfl:  .  co-enzyme Q-10 30 MG capsule, Take 100 mg by mouth daily., Disp: , Rfl:  .  Cyanocobalamin (VITAMIN B-12) 2500 MCG TABS, Take 5,000 mcg by mouth daily., Disp: 90 tablet, Rfl: 9 .  fish oil-omega-3 fatty acids  1000 MG capsule, Take 1 g by mouth daily., Disp: , Rfl:  .  Flaxseed, Linseed, (FLAX SEED OIL) 1000 MG CAPS, Take 1,000 mg by mouth daily., Disp: , Rfl:  .  Fluticasone Furoate-Vilanterol 100-25 MCG/INH AEPB, Inhale 1 puff into the lungs daily., Disp: 2 each, Rfl: 0 .  losartan (COZAAR) 25 MG tablet, Take 12.5 mg by mouth daily., Disp: , Rfl:  .  ranitidine (ZANTAC) 300 MG tablet, Take 300 mg by mouth at bedtime. , Disp: , Rfl:  .  sertraline (ZOLOFT) 50 MG tablet, Take 50 mg by mouth at bedtime., Disp: , Rfl:  .  simvastatin (ZOCOR) 40 MG tablet, Take 40 mg by mouth every other day. , Disp: , Rfl:  .  tiotropium (SPIRIVA) 18 MCG inhalation capsule, Place 18 mcg into inhaler and inhale daily., Disp: , Rfl:   Allergies: No Known Allergies  Past Medical History, Surgical history, Social history, and Family History were reviewed and updated.  Review of Systems: As above  Physical Exam:  height is 6' (1.829 m) and weight is 223 lb (101.152 kg). His oral temperature is 97.7 F (36.5 C). His blood pressure is 135/66 and his pulse is 93. His respiration is 20.   Well-developed and well-nourished white general. Head and neck exam shows no ocular or oral lesions. He has no scleral icterus. He has no adenopathy in the neck. Lungs  are clear. Cardiac exam regular rate and rhythm with no murmurs, rubs or bruits. Abdomen is soft. He has good bowel sounds. He's mildly obese. He has no fluid wave. There is no palpable liver or spleen tip. Back exam shows no tenderness over the spine, ribs or hips. Extremities shows no clubbing, cyanosis or edema. Skin exam no rashes, ecchymoses or petechia.  Lab Results  Component Value Date   WBC 3.9* 10/26/2014   HGB 10.1* 10/26/2014   HCT 30.2* 10/26/2014   MCV 118* 10/26/2014   PLT 192 10/26/2014     Chemistry      Component Value Date/Time   NA 140 09/23/2014 1116   NA 137 02/12/2014 1319   K 4.4 09/23/2014 1116   K 5.0 02/12/2014 1319   CL 102 09/23/2014  1116   CL 101 02/12/2014 1319   CO2 27 09/23/2014 1116   CO2 27 02/12/2014 1319   BUN 20 09/23/2014 1116   BUN 25* 02/12/2014 1319   CREATININE 1.4* 09/23/2014 1116   CREATININE 1.72* 02/12/2014 1319      Component Value Date/Time   CALCIUM 8.6 09/23/2014 1116   CALCIUM 9.3 02/12/2014 1319   ALKPHOS 37 09/23/2014 1116   ALKPHOS 39 02/12/2014 1319   AST 27 09/23/2014 1116   AST 29 02/12/2014 1319   ALT 20 09/23/2014 1116   ALT 23 02/12/2014 1319   BILITOT 0.60 09/23/2014 1116   BILITOT 0.4 02/12/2014 1319         Impression and Plan: Anthony [name] is 74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 He has refractory anemia. He has normal cytogenetics. I would classify him as low risk. Overall, he's done pretty well with it. He has improved since we first saw him.  I'm glad that the cardiologist when ahead and stop some of his medications. I really think that this will help him more than anything else.  His blood smear does not show any evidence of transformation to a more acute process. His physical exam is pretty much unremarkable.  We will give him a dose of Aranesp today.  We will see what his iron studies show. So far, they've really had not been all that bad.  We will plan to get him back in another month and see how he is feeling.  I spent about 35 minutes with him today.. I went over his labs. We adjusted his medications.     Volanda Napoleon, MD 5/17/201612:17 PM

## 2014-10-26 NOTE — Patient Instructions (Signed)
Darbepoetin Alfa injection What is this medicine? DARBEPOETIN ALFA (dar be POE e tin AL fa) helps your body make more red blood cells. It is used to treat anemia caused by chronic kidney failure and chemotherapy. This medicine may be used for other purposes; ask your health care provider or pharmacist if you have questions. COMMON BRAND NAME(S): Aranesp What should I tell my health care provider before I take this medicine? They need to know if you have any of these conditions: -blood clotting disorders or history of blood clots -cancer patient not on chemotherapy -cystic fibrosis -heart disease, such as angina, heart failure, or a history of a heart attack -hemoglobin level of 12 g/dL or greater -high blood pressure -low levels of folate, iron, or vitamin B12 -seizures -an unusual or allergic reaction to darbepoetin, erythropoietin, albumin, hamster proteins, latex, other medicines, foods, dyes, or preservatives -pregnant or trying to get pregnant -breast-feeding How should I use this medicine? This medicine is for injection into a vein or under the skin. It is usually given by a health care professional in a hospital or clinic setting. If you get this medicine at home, you will be taught how to prepare and give this medicine. Do not shake the solution before you withdraw a dose. Use exactly as directed. Take your medicine at regular intervals. Do not take your medicine more often than directed. It is important that you put your used needles and syringes in a special sharps container. Do not put them in a trash can. If you do not have a sharps container, call your pharmacist or healthcare provider to get one. Talk to your pediatrician regarding the use of this medicine in children. While this medicine may be used in children as young as 1 year for selected conditions, precautions do apply. Overdosage: If you think you have taken too much of this medicine contact a poison control center or  emergency room at once. NOTE: This medicine is only for you. Do not share this medicine with others. What if I miss a dose? If you miss a dose, take it as soon as you can. If it is almost time for your next dose, take only that dose. Do not take double or extra doses. What may interact with this medicine? Do not take this medicine with any of the following medications: -epoetin alfa This list may not describe all possible interactions. Give your health care provider a list of all the medicines, herbs, non-prescription drugs, or dietary supplements you use. Also tell them if you smoke, drink alcohol, or use illegal drugs. Some items may interact with your medicine. What should I watch for while using this medicine? Visit your prescriber or health care professional for regular checks on your progress and for the needed blood tests and blood pressure measurements. It is especially important for the doctor to make sure your hemoglobin level is in the desired range, to limit the risk of potential side effects and to give you the best benefit. Keep all appointments for any recommended tests. Check your blood pressure as directed. Ask your doctor what your blood pressure should be and when you should contact him or her. As your body makes more red blood cells, you may need to take iron, folic acid, or vitamin B supplements. Ask your doctor or health care provider which products are right for you. If you have kidney disease continue dietary restrictions, even though this medication can make you feel better. Talk with your doctor or health   care professional about the foods you eat and the vitamins that you take. What side effects may I notice from receiving this medicine? Side effects that you should report to your doctor or health care professional as soon as possible: -allergic reactions like skin rash, itching or hives, swelling of the face, lips, or tongue -breathing problems -changes in vision -chest  pain -confusion, trouble speaking or understanding -feeling faint or lightheaded, falls -high blood pressure -muscle aches or pains -pain, swelling, warmth in the leg -rapid weight gain -severe headaches -sudden numbness or weakness of the face, arm or leg -trouble walking, dizziness, loss of balance or coordination -seizures (convulsions) -swelling of the ankles, feet, hands -unusually weak or tired Side effects that usually do not require medical attention (report to your doctor or health care professional if they continue or are bothersome): -diarrhea -fever, chills (flu-like symptoms) -headaches -nausea, vomiting -redness, stinging, or swelling at site where injected This list may not describe all possible side effects. Call your doctor for medical advice about side effects. You may report side effects to FDA at 1-800-FDA-1088. Where should I keep my medicine? Keep out of the reach of children. Store in a refrigerator between 2 and 8 degrees C (36 and 46 degrees F). Do not freeze. Do not shake. Throw away any unused portion if using a single-dose vial. Throw away any unused medicine after the expiration date. NOTE: This sheet is a summary. It may not cover all possible information. If you have questions about this medicine, talk to your doctor, pharmacist, or health care provider.  2015, [employer]. (2008-05-11 10:23:57)  

## 2014-10-27 LAB — IRON AND TIBC CHCC
%SAT: 46 % (ref 20–55)
Iron: 150 ug/dL (ref 42–163)
TIBC: 330 ug/dL (ref 202–409)
UIBC: 180 ug/dL (ref 117–376)

## 2014-10-27 LAB — FERRITIN CHCC: FERRITIN: 725 ng/mL — AB (ref 22–316)

## 2014-11-12 ENCOUNTER — Ambulatory Visit (INDEPENDENT_AMBULATORY_CARE_PROVIDER_SITE_OTHER): Payer: Medicare Other | Admitting: Pulmonary Disease

## 2014-11-12 ENCOUNTER — Encounter: Payer: Self-pay | Admitting: Pulmonary Disease

## 2014-11-12 VITALS — BP 118/68 | HR 91 | Temp 97.0°F | Ht 72.0 in | Wt 223.2 lb

## 2014-11-12 DIAGNOSIS — J438 Other emphysema: Secondary | ICD-10-CM | POA: Diagnosis not present

## 2014-11-12 MED ORDER — BUDESONIDE-FORMOTEROL FUMARATE 160-4.5 MCG/ACT IN AERO: 2.0000 | INHALATION_SPRAY | Freq: Two times a day (BID) | RESPIRATORY_TRACT | Status: AC

## 2014-11-12 NOTE — Assessment & Plan Note (Signed)
The patient feels the addition of Symbicort to his regimen has made a big difference. He has seen improved exertional tolerance, and no significant cough or congestion. I have asked him to continue on his current meds, and to work aggressively on weight loss and some type of conditioning program.

## 2014-11-12 NOTE — Patient Instructions (Signed)
Will send in a 90 prescription for your symbicort to [facility], along with 3 fills. Stay on your spiriva Work on conditioning and weight loss followup with Dr. Lake Bells in 58mos, but call if having breathing issues.

## 2014-11-12 NOTE — Progress Notes (Signed)
   Subjective:    Patient ID: Anthony [name], male    DOB: 04-Dec-1940, 74 y.o.   MRN: [id number]  HPI The patient comes in today for follow-up of his known COPD. He had been managed on Spiriva alone, but had worsening dyspnea on exertion. Breo was added to his regimen with no change, and last visit he was started on Symbicort. He has seen a significant improvement in his dyspnea, and has not had any congestion or purulent mucus.   Review of Systems  Constitutional: Negative for fever and unexpected weight change.  HENT: Positive for congestion and postnasal drip. Negative for dental problem, ear pain, nosebleeds, rhinorrhea, sinus pressure, sneezing, sore throat and trouble swallowing.   Eyes: Negative for redness and itching.  Respiratory: Positive for cough and shortness of breath. Negative for chest tightness and wheezing.   Cardiovascular: Negative for palpitations and leg swelling.  Gastrointestinal: Negative for nausea and vomiting.  Genitourinary: Negative for dysuria.  Musculoskeletal: Negative for joint swelling.  Skin: Negative for rash.  Neurological: Negative for headaches.  Hematological: Does not bruise/bleed easily.  Psychiatric/Behavioral: Negative for dysphoric mood. The patient is not nervous/anxious.        Objective:   Physical Exam Overweight male in no acute distress Nose without purulence or discharge noted Neck without lymphadenopathy or thyromegaly Chest totally clear to auscultation, no wheezing Cardiac exam with regular rate and rhythm Lower extremities with minimal ankle edema, no cyanosis Alert and oriented, moves all 4 extremities.       Assessment & Plan:

## 2014-11-26 ENCOUNTER — Other Ambulatory Visit (HOSPITAL_BASED_OUTPATIENT_CLINIC_OR_DEPARTMENT_OTHER): Payer: Medicare Other

## 2014-11-26 ENCOUNTER — Encounter: Payer: Self-pay | Admitting: Oncology

## 2014-11-26 ENCOUNTER — Ambulatory Visit (HOSPITAL_BASED_OUTPATIENT_CLINIC_OR_DEPARTMENT_OTHER): Payer: Medicare Other | Admitting: Family

## 2014-11-26 ENCOUNTER — Ambulatory Visit (HOSPITAL_BASED_OUTPATIENT_CLINIC_OR_DEPARTMENT_OTHER): Payer: Medicare Other

## 2014-11-26 VITALS — BP 135/63 | HR 87 | Temp 98.0°F | Resp 20 | Wt 224.0 lb

## 2014-11-26 DIAGNOSIS — D464 Refractory anemia, unspecified: Secondary | ICD-10-CM | POA: Diagnosis not present

## 2014-11-26 DIAGNOSIS — D509 Iron deficiency anemia, unspecified: Secondary | ICD-10-CM

## 2014-11-26 DIAGNOSIS — D4620 Refractory anemia with excess of blasts, unspecified: Secondary | ICD-10-CM

## 2014-11-26 DIAGNOSIS — I10 Essential (primary) hypertension: Secondary | ICD-10-CM | POA: Diagnosis not present

## 2014-11-26 LAB — IRON AND TIBC CHCC
%SAT: 40 % (ref 20–55)
IRON: 127 ug/dL (ref 42–163)
TIBC: 320 ug/dL (ref 202–409)
UIBC: 193 ug/dL (ref 117–376)

## 2014-11-26 LAB — RETICULOCYTES (CHCC)
ABS Retic: 49.5 10*3/uL (ref 19.0–186.0)
RBC.: 2.75 MIL/uL — AB (ref 4.22–5.81)
Retic Ct Pct: 1.8 % (ref 0.4–2.3)

## 2014-11-26 LAB — CMP (CANCER CENTER ONLY)
ALK PHOS: 42 U/L (ref 26–84)
ALT: 22 U/L (ref 10–47)
AST: 26 U/L (ref 11–38)
Albumin: 3.5 g/dL (ref 3.3–5.5)
BUN: 18 mg/dL (ref 7–22)
CALCIUM: 8.8 mg/dL (ref 8.0–10.3)
CO2: 27 mEq/L (ref 18–33)
Chloride: 103 mEq/L (ref 98–108)
Creat: 1.3 mg/dl — ABNORMAL HIGH (ref 0.6–1.2)
Glucose, Bld: 81 mg/dL (ref 73–118)
POTASSIUM: 4.8 meq/L — AB (ref 3.3–4.7)
Sodium: 138 mEq/L (ref 128–145)
Total Bilirubin: 0.7 mg/dl (ref 0.20–1.60)
Total Protein: 7.8 g/dL (ref 6.4–8.1)

## 2014-11-26 LAB — CBC WITH DIFFERENTIAL (CANCER CENTER ONLY)
BASO#: 0 10*3/uL (ref 0.0–0.2)
BASO%: 0.3 % (ref 0.0–2.0)
EOS ABS: 0 10*3/uL (ref 0.0–0.5)
EOS%: 0.9 % (ref 0.0–7.0)
HCT: 31.1 % — ABNORMAL LOW (ref 38.7–49.9)
HGB: 10.4 g/dL — ABNORMAL LOW (ref 13.0–17.1)
LYMPH#: 1 10*3/uL (ref 0.9–3.3)
LYMPH%: 28.1 % (ref 14.0–48.0)
MCH: 39.8 pg — AB (ref 28.0–33.4)
MCHC: 33.4 g/dL (ref 32.0–35.9)
MCV: 119 fL — ABNORMAL HIGH (ref 82–98)
MONO#: 1.2 10*3/uL — ABNORMAL HIGH (ref 0.1–0.9)
MONO%: 35.9 % — ABNORMAL HIGH (ref 0.0–13.0)
NEUT#: 1.2 10*3/uL — ABNORMAL LOW (ref 1.5–6.5)
NEUT%: 34.8 % — ABNORMAL LOW (ref 40.0–80.0)
PLATELETS: 189 10*3/uL (ref 145–400)
RBC: 2.61 10*6/uL — AB (ref 4.20–5.70)
RDW: 14.5 % (ref 11.1–15.7)
WBC: 3.5 10*3/uL — AB (ref 4.0–10.0)

## 2014-11-26 LAB — CHCC SATELLITE - SMEAR

## 2014-11-26 LAB — FERRITIN CHCC: FERRITIN: 474 ng/mL — AB (ref 22–316)

## 2014-11-26 LAB — TECHNOLOGIST REVIEW CHCC SATELLITE

## 2014-11-26 MED ORDER — DARBEPOETIN ALFA 500 MCG/ML IJ SOSY
400.0000 ug | PREFILLED_SYRINGE | Freq: Once | INTRAMUSCULAR | Status: AC
  Administered 2014-11-26: 400 ug via SUBCUTANEOUS

## 2014-11-26 MED ORDER — DARBEPOETIN ALFA 200 MCG/0.4ML IJ SOSY
PREFILLED_SYRINGE | INTRAMUSCULAR | Status: AC
  Filled 2014-11-26: qty 0.8

## 2014-11-26 NOTE — Progress Notes (Signed)
Hematology and Oncology Follow Up Visit  Anthony [name] [id number] [birth year] 74 y.o. 11/26/2014   Principle Diagnosis:  Low-risk myelodysplasia Iron deficiency anemia  Current Therapy:   Aranesp 400 mcg subcutaneous as needed for hemoglobin less than 11 IV iron as indicated    Interim History:  Anthony [name] is here today for a follow-up. He is feeling much better since having his BP medication adjusted by his cardiologist. His energy has improved tremendously. He states that he is no longer having dizzy spells or nausiating headaches.  He denies fever, chills, n/v, cough, rash, headache, SOB, chest pain, palpitations, abdominal pain, constipation, diarrhea, blood in urine or stool. No episodes of bleeding. No night sweats.  No swelling, tenderness, numbness or tingling in his extremities. No new aches or pains.  His appetite is good and he is staying hydrated. His weight is stable.  In May, His ferritin was 725 and iron saturation was 46%. He last received iron in February.  His last dose of Feraheme was in April and he responded nicely.   Medications:    Medication List       This list is accurate as of: 11/26/14 10:36 AM.  Always use your most recent med list.               aspirin 81 MG tablet  Take 81 mg by mouth daily.     bimatoprost 0.01 % Soln  Commonly known as:  LUMIGAN  Place 1 drop into both eyes at bedtime.     budesonide-formoterol 160-4.5 MCG/ACT inhaler  Commonly known as:  SYMBICORT  Inhale 2 puffs into the lungs 2 (two) times daily.     clopidogrel 75 MG tablet  Commonly known as:  PLAVIX  Take 75 mg by mouth daily.     co-enzyme Q-10 30 MG capsule  Take 100 mg by mouth daily.     Cyanocobalamin 2500 MCG Tabs  Take 5,000 mcg by mouth daily.     fish oil-omega-3 fatty acids 1000 MG capsule  Take 1 g by mouth daily.     Flax Seed Oil 1000 MG Caps  Take 1,000 mg by mouth daily.     losartan 25 MG tablet  Commonly known as:  COZAAR  Take  12.5 mg by mouth daily.     PROAIR RESPICLICK 878 (90 BASE) MCG/ACT Aepb  Generic drug:  Albuterol Sulfate  Inhale 2 puffs into the lungs every 6 (six) hours as needed.     ranitidine 300 MG tablet  Commonly known as:  ZANTAC  Take 300 mg by mouth at bedtime.     sertraline 50 MG tablet  Commonly known as:  ZOLOFT  Take 50 mg by mouth at bedtime.     simvastatin 40 MG tablet  Commonly known as:  ZOCOR  Take 40 mg by mouth every other day.     tiotropium 18 MCG inhalation capsule  Commonly known as:  SPIRIVA  Place 18 mcg into inhaler and inhale daily.     Vitamin D3 5000 UNITS Tabs  Take by mouth daily.        Allergies: No Known Allergies  Past Medical History, Surgical history, Social history, and Family History were reviewed and updated.  Review of Systems: All other 10 point review of systems is negative.   Physical Exam:  vitals were not taken for this visit.  Wt Readings from Last 3 Encounters:  11/12/14 223 lb 3.2 oz (101.243 kg)  10/26/14 223 lb (101.152 kg)  10/12/14 219 lb 12.8 oz (99.701 kg)    Ocular: Sclerae unicteric, pupils equal, round and reactive to light Ear-nose-throat: Oropharynx clear, dentition fair Lymphatic: No cervical or supraclavicular adenopathy Lungs no rales or rhonchi, good excursion bilaterally Heart regular rate and rhythm, no murmur appreciated Abd soft, nontender, positive bowel sounds MSK no focal spinal tenderness, no joint edema Neuro: non-focal, well-oriented, appropriate affect  Lab Results  Component Value Date   WBC 3.9* 10/26/2014   HGB 10.1* 10/26/2014   HCT 30.2* 10/26/2014   MCV 118* 10/26/2014   PLT 192 10/26/2014   Lab Results  Component Value Date   FERRITIN 725* 10/26/2014   IRON 150 10/26/2014   TIBC 330 10/26/2014   UIBC 180 10/26/2014   IRONPCTSAT 46 10/26/2014   Lab Results  Component Value Date   RETICCTPCT 1.6 08/26/2014   RBC 2.56* 10/26/2014   RETICCTABS 45.8 08/26/2014   No results  found for: KPAFRELGTCHN, LAMBDASER, KAPLAMBRATIO No results found for: Osborne Casco Lab Results  Component Value Date   TOTALPROTELP 7.3 01/01/2014   ALBUMINELP 55.1* 01/01/2014   A1GS 6.9* 01/01/2014   A2GS 8.9 01/01/2014   BETS 7.9* 01/01/2014   BETA2SER 6.7* 01/01/2014   GAMS 14.5 01/01/2014   MSPIKE NOT DET 01/01/2014   SPEI * 01/01/2014     Chemistry      Component Value Date/Time   NA 140 10/26/2014 1111   NA 140 09/23/2014 1116   K 4.4 10/26/2014 1111   K 4.4 09/23/2014 1116   CL 103 10/26/2014 1111   CL 102 09/23/2014 1116   CO2 24 10/26/2014 1111   CO2 27 09/23/2014 1116   BUN 24* 10/26/2014 1111   BUN 20 09/23/2014 1116   CREATININE 1.44* 10/26/2014 1111   CREATININE 1.4* 09/23/2014 1116      Component Value Date/Time   CALCIUM 8.8 10/26/2014 1111   CALCIUM 8.6 09/23/2014 1116   ALKPHOS 36* 10/26/2014 1111   ALKPHOS 37 09/23/2014 1116   AST 19 10/26/2014 1111   AST 27 09/23/2014 1116   ALT 15 10/26/2014 1111   ALT 20 09/23/2014 1116   BILITOT 0.4 10/26/2014 1111   BILITOT 0.60 09/23/2014 1116     Impression and Plan: Anthony [name] is 74 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 He is feeling much better and is asymptomatic at this time.  His cardiologist was able to wean him off one of his BP medications. This has made a huge difference for him.  His Hgb today is 10.4. We will see what his iron studies show.  We will give him Aranesp today.  We will plan to see him back in 6 weeks for labs and follow-up.  He knows to call here with any questions or concerns. We can certainly see him sooner if need be.   Eliezer Bottom, NP 6/17/201610:36 AM

## 2014-11-26 NOTE — Patient Instructions (Signed)
Darbepoetin Alfa injection What is this medicine? DARBEPOETIN ALFA (dar be POE e tin AL fa) helps your body make more red blood cells. It is used to treat anemia caused by chronic kidney failure and chemotherapy. This medicine may be used for other purposes; ask your health care provider or pharmacist if you have questions. COMMON BRAND NAME(S): Aranesp What should I tell my health care provider before I take this medicine? They need to know if you have any of these conditions: -blood clotting disorders or history of blood clots -cancer patient not on chemotherapy -cystic fibrosis -heart disease, such as angina, heart failure, or a history of a heart attack -hemoglobin level of 12 g/dL or greater -high blood pressure -low levels of folate, iron, or vitamin B12 -seizures -an unusual or allergic reaction to darbepoetin, erythropoietin, albumin, hamster proteins, latex, other medicines, foods, dyes, or preservatives -pregnant or trying to get pregnant -breast-feeding How should I use this medicine? This medicine is for injection into a vein or under the skin. It is usually given by a health care professional in a hospital or clinic setting. If you get this medicine at home, you will be taught how to prepare and give this medicine. Do not shake the solution before you withdraw a dose. Use exactly as directed. Take your medicine at regular intervals. Do not take your medicine more often than directed. It is important that you put your used needles and syringes in a special sharps container. Do not put them in a trash can. If you do not have a sharps container, call your pharmacist or healthcare provider to get one. Talk to your pediatrician regarding the use of this medicine in children. While this medicine may be used in children as young as 1 year for selected conditions, precautions do apply. Overdosage: If you think you have taken too much of this medicine contact a poison control center or  emergency room at once. NOTE: This medicine is only for you. Do not share this medicine with others. What if I miss a dose? If you miss a dose, take it as soon as you can. If it is almost time for your next dose, take only that dose. Do not take double or extra doses. What may interact with this medicine? Do not take this medicine with any of the following medications: -epoetin alfa This list may not describe all possible interactions. Give your health care provider a list of all the medicines, herbs, non-prescription drugs, or dietary supplements you use. Also tell them if you smoke, drink alcohol, or use illegal drugs. Some items may interact with your medicine. What should I watch for while using this medicine? Visit your prescriber or health care professional for regular checks on your progress and for the needed blood tests and blood pressure measurements. It is especially important for the doctor to make sure your hemoglobin level is in the desired range, to limit the risk of potential side effects and to give you the best benefit. Keep all appointments for any recommended tests. Check your blood pressure as directed. Ask your doctor what your blood pressure should be and when you should contact him or her. As your body makes more red blood cells, you may need to take iron, folic acid, or vitamin B supplements. Ask your doctor or health care provider which products are right for you. If you have kidney disease continue dietary restrictions, even though this medication can make you feel better. Talk with your doctor or health   care professional about the foods you eat and the vitamins that you take. What side effects may I notice from receiving this medicine? Side effects that you should report to your doctor or health care professional as soon as possible: -allergic reactions like skin rash, itching or hives, swelling of the face, lips, or tongue -breathing problems -changes in vision -chest  pain -confusion, trouble speaking or understanding -feeling faint or lightheaded, falls -high blood pressure -muscle aches or pains -pain, swelling, warmth in the leg -rapid weight gain -severe headaches -sudden numbness or weakness of the face, arm or leg -trouble walking, dizziness, loss of balance or coordination -seizures (convulsions) -swelling of the ankles, feet, hands -unusually weak or tired Side effects that usually do not require medical attention (report to your doctor or health care professional if they continue or are bothersome): -diarrhea -fever, chills (flu-like symptoms) -headaches -nausea, vomiting -redness, stinging, or swelling at site where injected This list may not describe all possible side effects. Call your doctor for medical advice about side effects. You may report side effects to FDA at 1-800-FDA-1088. Where should I keep my medicine? Keep out of the reach of children. Store in a refrigerator between 2 and 8 degrees C (36 and 46 degrees F). Do not freeze. Do not shake. Throw away any unused portion if using a single-dose vial. Throw away any unused medicine after the expiration date. NOTE: This sheet is a summary. It may not cover all possible information. If you have questions about this medicine, talk to your doctor, pharmacist, or health care provider.  2015, [employer]. (2008-05-11 10:23:57)  

## 2014-11-26 NOTE — Progress Notes (Signed)
[name] called back with correct doses of med. Reconciled meds.

## 2014-11-26 NOTE — Progress Notes (Signed)
10:42 AM Pt to call in with correct dosage and adm of Losartan and Sertraline.

## 2014-12-15 ENCOUNTER — Ambulatory Visit: Payer: Medicare Other | Admitting: Pulmonary Disease

## 2015-01-12 ENCOUNTER — Encounter: Payer: Self-pay | Admitting: Hematology & Oncology

## 2015-01-12 ENCOUNTER — Ambulatory Visit (HOSPITAL_BASED_OUTPATIENT_CLINIC_OR_DEPARTMENT_OTHER): Payer: Medicare Other | Admitting: Hematology & Oncology

## 2015-01-12 ENCOUNTER — Ambulatory Visit (HOSPITAL_BASED_OUTPATIENT_CLINIC_OR_DEPARTMENT_OTHER): Payer: Medicare Other

## 2015-01-12 ENCOUNTER — Other Ambulatory Visit (HOSPITAL_BASED_OUTPATIENT_CLINIC_OR_DEPARTMENT_OTHER): Payer: Medicare Other

## 2015-01-12 VITALS — BP 131/67 | HR 92 | Temp 97.2°F | Resp 16 | Ht 72.0 in | Wt 223.0 lb

## 2015-01-12 DIAGNOSIS — D464 Refractory anemia, unspecified: Secondary | ICD-10-CM | POA: Diagnosis not present

## 2015-01-12 DIAGNOSIS — D509 Iron deficiency anemia, unspecified: Secondary | ICD-10-CM

## 2015-01-12 DIAGNOSIS — D4620 Refractory anemia with excess of blasts, unspecified: Secondary | ICD-10-CM

## 2015-01-12 LAB — CBC WITH DIFFERENTIAL (CANCER CENTER ONLY)
BASO#: 0 10*3/uL (ref 0.0–0.2)
BASO%: 0.3 % (ref 0.0–2.0)
EOS%: 0.8 % (ref 0.0–7.0)
Eosinophils Absolute: 0 10*3/uL (ref 0.0–0.5)
HCT: 30.2 % — ABNORMAL LOW (ref 38.7–49.9)
HEMOGLOBIN: 10.2 g/dL — AB (ref 13.0–17.1)
LYMPH#: 0.9 10*3/uL (ref 0.9–3.3)
LYMPH%: 25.3 % (ref 14.0–48.0)
MCH: 40.3 pg — ABNORMAL HIGH (ref 28.0–33.4)
MCHC: 33.8 g/dL (ref 32.0–35.9)
MCV: 119 fL — ABNORMAL HIGH (ref 82–98)
MONO#: 1.3 10*3/uL — AB (ref 0.1–0.9)
MONO%: 35 % — AB (ref 0.0–13.0)
NEUT%: 38.6 % — ABNORMAL LOW (ref 40.0–80.0)
NEUTROS ABS: 1.4 10*3/uL — AB (ref 1.5–6.5)
Platelets: 195 10*3/uL (ref 145–400)
RBC: 2.53 10*6/uL — AB (ref 4.20–5.70)
RDW: 14.2 % (ref 11.1–15.7)
WBC: 3.7 10*3/uL — AB (ref 4.0–10.0)

## 2015-01-12 LAB — COMPREHENSIVE METABOLIC PANEL (CC13)
ALK PHOS: 41 U/L (ref 40–150)
ALT: 19 U/L (ref 0–55)
AST: 22 U/L (ref 5–34)
Albumin: 3.9 g/dL (ref 3.5–5.0)
Anion Gap: 8 mEq/L (ref 3–11)
BUN: 21.1 mg/dL (ref 7.0–26.0)
CALCIUM: 8.6 mg/dL (ref 8.4–10.4)
CO2: 26 mEq/L (ref 22–29)
Chloride: 106 mEq/L (ref 98–109)
Creatinine: 1.5 mg/dL — ABNORMAL HIGH (ref 0.7–1.3)
EGFR: 46 mL/min/{1.73_m2} — AB (ref >90)
Glucose: 99 mg/dl (ref 70–140)
Potassium: 4.9 mEq/L (ref 3.5–5.1)
Sodium: 140 mEq/L (ref 136–145)
TOTAL PROTEIN: 7.4 g/dL (ref 6.4–8.3)
Total Bilirubin: 0.41 mg/dL (ref 0.20–1.20)

## 2015-01-12 LAB — RETICULOCYTES (CHCC)
ABS Retic: 67.3 10*3/uL (ref 19.0–186.0)
RBC.: 2.69 MIL/uL — ABNORMAL LOW (ref 4.22–5.81)
Retic Ct Pct: 2.5 % — ABNORMAL HIGH (ref 0.4–2.3)

## 2015-01-12 LAB — TECHNOLOGIST REVIEW CHCC SATELLITE

## 2015-01-12 LAB — CHCC SATELLITE - SMEAR

## 2015-01-12 MED ORDER — DARBEPOETIN ALFA 500 MCG/ML IJ SOSY
400.0000 ug | PREFILLED_SYRINGE | Freq: Once | INTRAMUSCULAR | Status: AC
  Administered 2015-01-12: 400 ug via SUBCUTANEOUS

## 2015-01-12 MED ORDER — DARBEPOETIN ALFA 200 MCG/0.4ML IJ SOSY
PREFILLED_SYRINGE | INTRAMUSCULAR | Status: AC
  Filled 2015-01-12: qty 0.8

## 2015-01-12 NOTE — Patient Instructions (Signed)
Darbepoetin Alfa injection What is this medicine? DARBEPOETIN ALFA (dar be POE e tin AL fa) helps your body make more red blood cells. It is used to treat anemia caused by chronic kidney failure and chemotherapy. This medicine may be used for other purposes; ask your health care provider or pharmacist if you have questions. COMMON BRAND NAME(S): Aranesp What should I tell my health care provider before I take this medicine? They need to know if you have any of these conditions: -blood clotting disorders or history of blood clots -cancer patient not on chemotherapy -cystic fibrosis -heart disease, such as angina, heart failure, or a history of a heart attack -hemoglobin level of 12 g/dL or greater -high blood pressure -low levels of folate, iron, or vitamin B12 -seizures -an unusual or allergic reaction to darbepoetin, erythropoietin, albumin, hamster proteins, latex, other medicines, foods, dyes, or preservatives -pregnant or trying to get pregnant -breast-feeding How should I use this medicine? This medicine is for injection into a vein or under the skin. It is usually given by a health care professional in a hospital or clinic setting. If you get this medicine at home, you will be taught how to prepare and give this medicine. Do not shake the solution before you withdraw a dose. Use exactly as directed. Take your medicine at regular intervals. Do not take your medicine more often than directed. It is important that you put your used needles and syringes in a special sharps container. Do not put them in a trash can. If you do not have a sharps container, call your pharmacist or healthcare provider to get one. Talk to your pediatrician regarding the use of this medicine in children. While this medicine may be used in children as young as 1 year for selected conditions, precautions do apply. Overdosage: If you think you have taken too much of this medicine contact a poison control center or  emergency room at once. NOTE: This medicine is only for you. Do not share this medicine with others. What if I miss a dose? If you miss a dose, take it as soon as you can. If it is almost time for your next dose, take only that dose. Do not take double or extra doses. What may interact with this medicine? Do not take this medicine with any of the following medications: -epoetin alfa This list may not describe all possible interactions. Give your health care provider a list of all the medicines, herbs, non-prescription drugs, or dietary supplements you use. Also tell them if you smoke, drink alcohol, or use illegal drugs. Some items may interact with your medicine. What should I watch for while using this medicine? Visit your prescriber or health care professional for regular checks on your progress and for the needed blood tests and blood pressure measurements. It is especially important for the doctor to make sure your hemoglobin level is in the desired range, to limit the risk of potential side effects and to give you the best benefit. Keep all appointments for any recommended tests. Check your blood pressure as directed. Ask your doctor what your blood pressure should be and when you should contact him or her. As your body makes more red blood cells, you may need to take iron, folic acid, or vitamin B supplements. Ask your doctor or health care provider which products are right for you. If you have kidney disease continue dietary restrictions, even though this medication can make you feel better. Talk with your doctor or health   care professional about the foods you eat and the vitamins that you take. What side effects may I notice from receiving this medicine? Side effects that you should report to your doctor or health care professional as soon as possible: -allergic reactions like skin rash, itching or hives, swelling of the face, lips, or tongue -breathing problems -changes in vision -chest  pain -confusion, trouble speaking or understanding -feeling faint or lightheaded, falls -high blood pressure -muscle aches or pains -pain, swelling, warmth in the leg -rapid weight gain -severe headaches -sudden numbness or weakness of the face, arm or leg -trouble walking, dizziness, loss of balance or coordination -seizures (convulsions) -swelling of the ankles, feet, hands -unusually weak or tired Side effects that usually do not require medical attention (report to your doctor or health care professional if they continue or are bothersome): -diarrhea -fever, chills (flu-like symptoms) -headaches -nausea, vomiting -redness, stinging, or swelling at site where injected This list may not describe all possible side effects. Call your doctor for medical advice about side effects. You may report side effects to FDA at 1-800-FDA-1088. Where should I keep my medicine? Keep out of the reach of children. Store in a refrigerator between 2 and 8 degrees C (36 and 46 degrees F). Do not freeze. Do not shake. Throw away any unused portion if using a single-dose vial. Throw away any unused medicine after the expiration date. NOTE: This sheet is a summary. It may not cover all possible information. If you have questions about this medicine, talk to your doctor, pharmacist, or health care provider.  2015, [employer]. (2008-05-11 10:23:57)  

## 2015-01-12 NOTE — Progress Notes (Signed)
Hematology and Oncology Follow Up Visit  Anthony [name] [id number] [birth year] 74 y.o. 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  Anthony [name] is back for follow-up. He feels a little more tired. We gave him a 6 week interval for follow-up. He says in the past week or so, he's felt worse.  He's had no bleeding. He's had no fever. He's had no nausea or vomiting. His appetite has been pretty good. He's not noted any problems with diarrhea or constipation.  He's not noted any leg swelling. He's had no chest pain. He does have cardiac issues. He is being followed by cardiology.  Overall, his performance status is ECOG 1. Medications:  Current outpatient prescriptions:  .  Albuterol Sulfate (PROAIR RESPICLICK) 409 (90 BASE) MCG/ACT AEPB, Inhale 2 puffs into the lungs every 6 (six) hours as needed., Disp: , Rfl:  .  aspirin 81 MG tablet, Take 81 mg by mouth daily., Disp: , Rfl:  .  bimatoprost (LUMIGAN) 0.01 % SOLN, Place 1 drop into both eyes at bedtime., Disp: , Rfl:  .  budesonide-formoterol (SYMBICORT) 160-4.5 MCG/ACT inhaler, Inhale 2 puffs into the lungs 2 (two) times daily., Disp: 3 Inhaler, Rfl: 3 .  Cholecalciferol (VITAMIN D3) 5000 UNITS TABS, Take by mouth daily., Disp: , Rfl:  .  clopidogrel (PLAVIX) 75 MG tablet, Take 75 mg by mouth daily. , Disp: , Rfl:  .  co-enzyme Q-10 30 MG capsule, Take 100 mg by mouth daily., Disp: , Rfl:  .  Cyanocobalamin (VITAMIN B-12) 2500 MCG TABS, Take 5,000 mcg by mouth daily., Disp: 90 tablet, Rfl: 9 .  fish oil-omega-3 fatty acids 1000 MG capsule, Take 1 g by mouth daily., Disp: , Rfl:  .  Flaxseed, Linseed, (FLAX SEED OIL) 1000 MG CAPS, Take 1,000 mg by mouth daily., Disp: , Rfl:  .  losartan (COZAAR) 50 MG tablet, 25 mg daily., Disp: , Rfl:  .  ranitidine (ZANTAC) 300 MG tablet, Take 300 mg  by mouth at bedtime. , Disp: , Rfl:  .  sertraline (ZOLOFT) 100 MG tablet, Take 100 mg by mouth daily., Disp: , Rfl: 1 .  simvastatin (ZOCOR) 40 MG tablet, Take 40 mg by mouth every other day. , Disp: , Rfl:  .  tiotropium (SPIRIVA) 18 MCG inhalation capsule, Place 18 mcg into inhaler and inhale daily., Disp: , Rfl:   Allergies: No Known Allergies  Past Medical History, Surgical history, Social history, and Family History were reviewed and updated.  Review of Systems: As above  Physical Exam:  height is 6' (1.829 m) and weight is 223 lb (101.152 kg). His oral temperature is 97.2 F (36.2 C). His blood pressure is 131/67 and his pulse is 92. His respiration is 16.   Well-developed and well-nourished white general. Head and neck exam shows no ocular or oral lesions. He has no scleral icterus. He has no adenopathy in the neck. Lungs are clear. Cardiac exam regular rate and rhythm with no murmurs, rubs or bruits. Abdomen is soft. He has good bowel sounds. He's mildly obese. He has no fluid wave. There is no palpable liver or spleen tip. Back exam shows no tenderness over the spine, ribs or hips. Extremities shows no clubbing, cyanosis or edema. Skin exam no rashes, ecchymoses or petechia.  Lab Results  Component Value Date  WBC 3.7* 01/12/2015   HGB 10.2* 01/12/2015   HCT 30.2* 01/12/2015   MCV 119* 01/12/2015   PLT 195 01/12/2015     Chemistry      Component Value Date/Time   NA 138 11/26/2014 1025   NA 140 10/26/2014 1111   K 4.8* 11/26/2014 1025   K 4.4 10/26/2014 1111   CL 103 11/26/2014 1025   CL 103 10/26/2014 1111   CO2 27 11/26/2014 1025   CO2 24 10/26/2014 1111   BUN 18 11/26/2014 1025   BUN 24* 10/26/2014 1111   CREATININE 1.3* 11/26/2014 1025   CREATININE 1.44* 10/26/2014 1111      Component Value Date/Time   CALCIUM 8.8 11/26/2014 1025   CALCIUM 8.8 10/26/2014 1111   ALKPHOS 42 11/26/2014 1025   ALKPHOS 36* 10/26/2014 1111   AST 26 11/26/2014 1025   AST 19  10/26/2014 1111   ALT 22 11/26/2014 1025   ALT 15 10/26/2014 1111   BILITOT 0.70 11/26/2014 1025   BILITOT 0.4 10/26/2014 1111         Impression and Plan: [name] is 74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 He has refractory anemia. He has normal cytogenetics. I would classify him as low risk. Overall, he's done pretty well with it. He has improved since we first saw him.  He feels that having the extensor 2 weeks off has probably made him a little more tired. I tend to agree.  We will go ahead and give him Aranesp.  I will like to see him back in one month. I think we'll have to get him back at monthly intervals in order to try to get his blood count better.  We'll see him back, I will send off the NGS for myelodysplasia to see what genetic abnormalities he might have, despite having normal cytogenetics.    Volanda Napoleon, MD 8/3/20161:13 PM

## 2015-01-13 LAB — IRON AND TIBC CHCC
%SAT: 43 % (ref 20–55)
Iron: 142 ug/dL (ref 42–163)
TIBC: 326 ug/dL (ref 202–409)
UIBC: 184 ug/dL (ref 117–376)

## 2015-01-13 LAB — FERRITIN CHCC: Ferritin: 435 ng/ml — ABNORMAL HIGH (ref 22–316)

## 2015-02-15 ENCOUNTER — Ambulatory Visit (HOSPITAL_BASED_OUTPATIENT_CLINIC_OR_DEPARTMENT_OTHER): Payer: Medicare Other | Admitting: Family

## 2015-02-15 ENCOUNTER — Ambulatory Visit: Payer: Medicare Other

## 2015-02-15 ENCOUNTER — Other Ambulatory Visit (HOSPITAL_BASED_OUTPATIENT_CLINIC_OR_DEPARTMENT_OTHER): Payer: Medicare Other

## 2015-02-15 ENCOUNTER — Encounter: Payer: Self-pay | Admitting: Family

## 2015-02-15 VITALS — BP 134/62 | HR 88 | Temp 97.4°F | Resp 18 | Ht 72.0 in | Wt 221.0 lb

## 2015-02-15 DIAGNOSIS — D469 Myelodysplastic syndrome, unspecified: Secondary | ICD-10-CM | POA: Diagnosis not present

## 2015-02-15 DIAGNOSIS — D4620 Refractory anemia with excess of blasts, unspecified: Secondary | ICD-10-CM

## 2015-02-15 DIAGNOSIS — D464 Refractory anemia, unspecified: Secondary | ICD-10-CM

## 2015-02-15 DIAGNOSIS — D509 Iron deficiency anemia, unspecified: Secondary | ICD-10-CM

## 2015-02-15 LAB — CMP (CANCER CENTER ONLY)
ALK PHOS: 41 U/L (ref 26–84)
ALT: 20 U/L (ref 10–47)
AST: 28 U/L (ref 11–38)
Albumin: 3.4 g/dL (ref 3.3–5.5)
BUN: 20 mg/dL (ref 7–22)
CO2: 25 mEq/L (ref 18–33)
Calcium: 9 mg/dL (ref 8.0–10.3)
Chloride: 104 mEq/L (ref 98–108)
Creat: 1.5 mg/dl — ABNORMAL HIGH (ref 0.6–1.2)
Glucose, Bld: 84 mg/dL (ref 73–118)
POTASSIUM: 4.3 meq/L (ref 3.3–4.7)
Sodium: 139 mEq/L (ref 128–145)
TOTAL PROTEIN: 8.1 g/dL (ref 6.4–8.1)
Total Bilirubin: 0.5 mg/dl (ref 0.20–1.60)

## 2015-02-15 LAB — CBC WITH DIFFERENTIAL (CANCER CENTER ONLY)
BASO#: 0 10*3/uL (ref 0.0–0.2)
BASO%: 0.3 % (ref 0.0–2.0)
EOS%: 0.9 % (ref 0.0–7.0)
Eosinophils Absolute: 0 10*3/uL (ref 0.0–0.5)
HCT: 30.8 % — ABNORMAL LOW (ref 38.7–49.9)
HGB: 10.2 g/dL — ABNORMAL LOW (ref 13.0–17.1)
LYMPH#: 0.7 10*3/uL — ABNORMAL LOW (ref 0.9–3.3)
LYMPH%: 21.1 % (ref 14.0–48.0)
MCH: 39.2 pg — ABNORMAL HIGH (ref 28.0–33.4)
MCHC: 33.1 g/dL (ref 32.0–35.9)
MCV: 119 fL — ABNORMAL HIGH (ref 82–98)
MONO#: 1.4 10*3/uL — ABNORMAL HIGH (ref 0.1–0.9)
MONO%: 41.5 % — ABNORMAL HIGH (ref 0.0–13.0)
NEUT#: 1.2 10*3/uL — ABNORMAL LOW (ref 1.5–6.5)
NEUT%: 36.2 % — ABNORMAL LOW (ref 40.0–80.0)
Platelets: 212 10*3/uL (ref 145–400)
RBC: 2.6 10*6/uL — ABNORMAL LOW (ref 4.20–5.70)
RDW: 14.1 % (ref 11.1–15.7)
WBC: 3.4 10*3/uL — ABNORMAL LOW (ref 4.0–10.0)

## 2015-02-15 LAB — RETICULOCYTES (CHCC)
ABS Retic: 56.1 10*3/uL (ref 19.0–186.0)
RBC.: 2.67 MIL/uL — ABNORMAL LOW (ref 4.22–5.81)
Retic Ct Pct: 2.1 % (ref 0.4–2.3)

## 2015-02-15 LAB — IRON AND TIBC CHCC
%SAT: 40 % (ref 20–55)
Iron: 144 ug/dL (ref 42–163)
TIBC: 356 ug/dL (ref 202–409)
UIBC: 212 ug/dL (ref 117–376)

## 2015-02-15 LAB — TECHNOLOGIST REVIEW CHCC SATELLITE

## 2015-02-15 LAB — FERRITIN CHCC: FERRITIN: 361 ng/mL — AB (ref 22–316)

## 2015-02-15 LAB — CHCC SATELLITE - SMEAR

## 2015-02-15 MED ORDER — DARBEPOETIN ALFA 500 MCG/ML IJ SOSY
400.0000 ug | PREFILLED_SYRINGE | Freq: Once | INTRAMUSCULAR | Status: AC
  Administered 2015-02-15: 400 ug via SUBCUTANEOUS

## 2015-02-15 MED ORDER — DARBEPOETIN ALFA 200 MCG/0.4ML IJ SOSY
PREFILLED_SYRINGE | INTRAMUSCULAR | Status: AC
  Filled 2015-02-15: qty 0.8

## 2015-02-15 NOTE — Progress Notes (Signed)
Hematology and Oncology Follow Up Visit  Anthony [name] [id number] [birth year] 74 y.o. 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 He is having issues with fatigue and dizziness again. His Hgb today is 10.2. His last dose of Feraheme was in April and he responded nicely.  He has had no fever, chills, n/v, cough, rash, headache, SOB, chest pain, palpitations, night sweats, abdominal pain, changes in bowel or bladder habits. He has not noticed any blood in his urine or stool. No swelling or tenderness in his extremities. No c/o aches or pains. He has some numbness and tingling in his toes that is unchanged.This has not effected his gait and he has had no falls.  He is eating well and staying hydrated. His weight is stable.   Medications:    Medication List       This list is accurate as of: 02/15/15 10:19 AM.  Always use your most recent med list.               aspirin 81 MG tablet  Take 81 mg by mouth daily.     bimatoprost 0.01 % Soln  Commonly known as:  LUMIGAN  Place 1 drop into both eyes at bedtime.     budesonide-formoterol 160-4.5 MCG/ACT inhaler  Commonly known as:  SYMBICORT  Inhale 2 puffs into the lungs 2 (two) times daily.     clopidogrel 75 MG tablet  Commonly known as:  PLAVIX  Take 75 mg by mouth daily.     co-enzyme Q-10 30 MG capsule  Take 100 mg by mouth daily.     Cyanocobalamin 2500 MCG Tabs  Take 5,000 mcg by mouth daily.     fish oil-omega-3 fatty acids 1000 MG capsule  Take 1 g by mouth daily.     Flax Seed Oil 1000 MG Caps  Take 1,000 mg by mouth daily.     losartan 50 MG tablet  Commonly known as:  COZAAR  25 mg daily.     PROAIR RESPICLICK 093 (90 BASE) MCG/ACT Aepb  Generic drug:  Albuterol Sulfate  Inhale 2 puffs into the  lungs every 6 (six) hours as needed.     ranitidine 300 MG tablet  Commonly known as:  ZANTAC  Take 300 mg by mouth at bedtime.     sertraline 100 MG tablet  Commonly known as:  ZOLOFT  Take 100 mg by mouth daily.     simvastatin 40 MG tablet  Commonly known as:  ZOCOR  Take 40 mg by mouth every other day.     tiotropium 18 MCG inhalation capsule  Commonly known as:  SPIRIVA  Place 18 mcg into inhaler and inhale daily.     Vitamin D3 5000 UNITS Tabs  Take by mouth daily.        Allergies: No Known Allergies  Past Medical History, Surgical history, Social history, and Family History were reviewed and updated.  Review of Systems: All other 10 point review of systems is negative.   Physical Exam:  height is 6' (1.829 m) and weight is 221 lb (100.245 kg). His oral temperature is 97.4 F (36.3 C). His blood pressure is 134/62 and his pulse is 88. His respiration is 18.   Wt Readings from Last 3 Encounters:  02/15/15  221 lb (100.245 kg)  01/12/15 223 lb (101.152 kg)  11/26/14 224 lb (101.606 kg)    Ocular: Sclerae unicteric, pupils equal, round and reactive to light Ear-nose-throat: Oropharynx clear, dentition fair Lymphatic: No cervical or supraclavicular adenopathy Lungs no rales or rhonchi, good excursion bilaterally Heart regular rate and rhythm, no murmur appreciated Abd soft, nontender, positive bowel sounds MSK no focal spinal tenderness, no joint edema Neuro: non-focal, well-oriented, appropriate affect  Lab Results  Component Value Date   WBC 3.4* 02/15/2015   HGB 10.2* 02/15/2015   HCT 30.8* 02/15/2015   MCV 119* 02/15/2015   PLT 212 02/15/2015   Lab Results  Component Value Date   FERRITIN 435* 01/12/2015   IRON 142 01/12/2015   TIBC 326 01/12/2015   UIBC 184 01/12/2015   IRONPCTSAT 43 01/12/2015   Lab Results  Component Value Date   RETICCTPCT 2.5* 01/12/2015   RBC 2.60* 02/15/2015   RETICCTABS 67.3 01/12/2015   No results found for:  KPAFRELGTCHN, LAMBDASER, KAPLAMBRATIO No results found for: Osborne Casco Lab Results  Component Value Date   TOTALPROTELP 7.3 01/01/2014   ALBUMINELP 55.1* 01/01/2014   A1GS 6.9* 01/01/2014   A2GS 8.9 01/01/2014   BETS 7.9* 01/01/2014   BETA2SER 6.7* 01/01/2014   GAMS 14.5 01/01/2014   MSPIKE NOT DET 01/01/2014   SPEI * 01/01/2014     Chemistry      Component Value Date/Time   NA 140 01/12/2015 1206   NA 138 11/26/2014 1025   NA 140 10/26/2014 1111   K 4.9 01/12/2015 1206   K 4.8* 11/26/2014 1025   K 4.4 10/26/2014 1111   CL 103 11/26/2014 1025   CL 103 10/26/2014 1111   CO2 26 01/12/2015 1206   CO2 27 11/26/2014 1025   CO2 24 10/26/2014 1111   BUN 21.1 01/12/2015 1206   BUN 18 11/26/2014 1025   BUN 24* 10/26/2014 1111   CREATININE 1.5* 01/12/2015 1206   CREATININE 1.3* 11/26/2014 1025   CREATININE 1.44* 10/26/2014 1111      Component Value Date/Time   CALCIUM 8.6 01/12/2015 1206   CALCIUM 8.8 11/26/2014 1025   CALCIUM 8.8 10/26/2014 1111   ALKPHOS 41 01/12/2015 1206   ALKPHOS 42 11/26/2014 1025   ALKPHOS 36* 10/26/2014 1111   AST 22 01/12/2015 1206   AST 26 11/26/2014 1025   AST 19 10/26/2014 1111   ALT 19 01/12/2015 1206   ALT 22 11/26/2014 1025   ALT 15 10/26/2014 1111   BILITOT 0.41 01/12/2015 1206   BILITOT 0.70 11/26/2014 1025   BILITOT 0.4 10/26/2014 1111     Impression and Plan: Anthony [name] is 74 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 He having fatigue and dizziness.  His Hgb today is 10.4 so he will get a dose of Aranesp today. We will see what his iron studies show and bring him in later this week for Feraheme if needed.  We will plan to see him back in 1 month for labs and follow-up.  He knows to call here with any questions or concerns. We can certainly see him sooner if need be.   Eliezer Bottom, NP 9/6/201610:19 AM

## 2015-02-15 NOTE — Patient Instructions (Signed)
Darbepoetin Alfa injection What is this medicine? DARBEPOETIN ALFA (dar be POE e tin AL fa) helps your body make more red blood cells. It is used to treat anemia caused by chronic kidney failure and chemotherapy. This medicine may be used for other purposes; ask your health care provider or pharmacist if you have questions. COMMON BRAND NAME(S): Aranesp What should I tell my health care provider before I take this medicine? They need to know if you have any of these conditions: -blood clotting disorders or history of blood clots -cancer patient not on chemotherapy -cystic fibrosis -heart disease, such as angina, heart failure, or a history of a heart attack -hemoglobin level of 12 g/dL or greater -high blood pressure -low levels of folate, iron, or vitamin B12 -seizures -an unusual or allergic reaction to darbepoetin, erythropoietin, albumin, hamster proteins, latex, other medicines, foods, dyes, or preservatives -pregnant or trying to get pregnant -breast-feeding How should I use this medicine? This medicine is for injection into a vein or under the skin. It is usually given by a health care professional in a hospital or clinic setting. If you get this medicine at home, you will be taught how to prepare and give this medicine. Do not shake the solution before you withdraw a dose. Use exactly as directed. Take your medicine at regular intervals. Do not take your medicine more often than directed. It is important that you put your used needles and syringes in a special sharps container. Do not put them in a trash can. If you do not have a sharps container, call your pharmacist or healthcare provider to get one. Talk to your pediatrician regarding the use of this medicine in children. While this medicine may be used in children as young as 1 year for selected conditions, precautions do apply. Overdosage: If you think you have taken too much of this medicine contact a poison control center or  emergency room at once. NOTE: This medicine is only for you. Do not share this medicine with others. What if I miss a dose? If you miss a dose, take it as soon as you can. If it is almost time for your next dose, take only that dose. Do not take double or extra doses. What may interact with this medicine? Do not take this medicine with any of the following medications: -epoetin alfa This list may not describe all possible interactions. Give your health care provider a list of all the medicines, herbs, non-prescription drugs, or dietary supplements you use. Also tell them if you smoke, drink alcohol, or use illegal drugs. Some items may interact with your medicine. What should I watch for while using this medicine? Visit your prescriber or health care professional for regular checks on your progress and for the needed blood tests and blood pressure measurements. It is especially important for the doctor to make sure your hemoglobin level is in the desired range, to limit the risk of potential side effects and to give you the best benefit. Keep all appointments for any recommended tests. Check your blood pressure as directed. Ask your doctor what your blood pressure should be and when you should contact him or her. As your body makes more red blood cells, you may need to take iron, folic acid, or vitamin B supplements. Ask your doctor or health care provider which products are right for you. If you have kidney disease continue dietary restrictions, even though this medication can make you feel better. Talk with your doctor or health   care professional about the foods you eat and the vitamins that you take. What side effects may I notice from receiving this medicine? Side effects that you should report to your doctor or health care professional as soon as possible: -allergic reactions like skin rash, itching or hives, swelling of the face, lips, or tongue -breathing problems -changes in vision -chest  pain -confusion, trouble speaking or understanding -feeling faint or lightheaded, falls -high blood pressure -muscle aches or pains -pain, swelling, warmth in the leg -rapid weight gain -severe headaches -sudden numbness or weakness of the face, arm or leg -trouble walking, dizziness, loss of balance or coordination -seizures (convulsions) -swelling of the ankles, feet, hands -unusually weak or tired Side effects that usually do not require medical attention (report to your doctor or health care professional if they continue or are bothersome): -diarrhea -fever, chills (flu-like symptoms) -headaches -nausea, vomiting -redness, stinging, or swelling at site where injected This list may not describe all possible side effects. Call your doctor for medical advice about side effects. You may report side effects to FDA at 1-800-FDA-1088. Where should I keep my medicine? Keep out of the reach of children. Store in a refrigerator between 2 and 8 degrees C (36 and 46 degrees F). Do not freeze. Do not shake. Throw away any unused portion if using a single-dose vial. Throw away any unused medicine after the expiration date. NOTE: This sheet is a summary. It may not cover all possible information. If you have questions about this medicine, talk to your doctor, pharmacist, or health care provider.  2015, [employer]. (2008-05-11 10:23:57)  

## 2015-03-18 ENCOUNTER — Ambulatory Visit (HOSPITAL_BASED_OUTPATIENT_CLINIC_OR_DEPARTMENT_OTHER): Payer: Medicare Other | Admitting: Hematology & Oncology

## 2015-03-18 ENCOUNTER — Ambulatory Visit (HOSPITAL_BASED_OUTPATIENT_CLINIC_OR_DEPARTMENT_OTHER): Payer: Medicare Other

## 2015-03-18 ENCOUNTER — Other Ambulatory Visit (HOSPITAL_BASED_OUTPATIENT_CLINIC_OR_DEPARTMENT_OTHER): Payer: Medicare Other

## 2015-03-18 ENCOUNTER — Encounter: Payer: Self-pay | Admitting: Hematology & Oncology

## 2015-03-18 VITALS — BP 138/65 | HR 93 | Temp 97.6°F | Resp 18 | Ht 72.0 in | Wt 222.0 lb

## 2015-03-18 DIAGNOSIS — D464 Refractory anemia, unspecified: Secondary | ICD-10-CM | POA: Diagnosis not present

## 2015-03-18 DIAGNOSIS — D46Z Other myelodysplastic syndromes: Secondary | ICD-10-CM

## 2015-03-18 DIAGNOSIS — D509 Iron deficiency anemia, unspecified: Secondary | ICD-10-CM

## 2015-03-18 DIAGNOSIS — D4620 Refractory anemia with excess of blasts, unspecified: Secondary | ICD-10-CM

## 2015-03-18 LAB — CBC WITH DIFFERENTIAL (CANCER CENTER ONLY)
BASO#: 0 10*3/uL (ref 0.0–0.2)
BASO%: 0.4 % (ref 0.0–2.0)
EOS ABS: 0 10*3/uL (ref 0.0–0.5)
EOS%: 0.8 % (ref 0.0–7.0)
HCT: 30.1 % — ABNORMAL LOW (ref 38.7–49.9)
HEMOGLOBIN: 9.9 g/dL — AB (ref 13.0–17.1)
LYMPH#: 0.9 10*3/uL (ref 0.9–3.3)
LYMPH%: 32 % (ref 14.0–48.0)
MCH: 39 pg — AB (ref 28.0–33.4)
MCHC: 32.9 g/dL (ref 32.0–35.9)
MCV: 119 fL — ABNORMAL HIGH (ref 82–98)
MONO#: 1 10*3/uL — ABNORMAL HIGH (ref 0.1–0.9)
MONO%: 35.7 % — AB (ref 0.0–13.0)
NEUT#: 0.8 10*3/uL — ABNORMAL LOW (ref 1.5–6.5)
NEUT%: 31.1 % — AB (ref 40.0–80.0)
PLATELETS: 229 10*3/uL (ref 145–400)
RBC: 2.54 10*6/uL — ABNORMAL LOW (ref 4.20–5.70)
RDW: 14 % (ref 11.1–15.7)
WBC: 2.7 10*3/uL — ABNORMAL LOW (ref 4.0–10.0)

## 2015-03-18 LAB — CMP (CANCER CENTER ONLY)
ALK PHOS: 37 U/L (ref 26–84)
ALT: 23 U/L (ref 10–47)
AST: 28 U/L (ref 11–38)
Albumin: 3.5 g/dL (ref 3.3–5.5)
BUN: 19 mg/dL (ref 7–22)
CO2: 27 mEq/L (ref 18–33)
CREATININE: 1.7 mg/dL — AB (ref 0.6–1.2)
Calcium: 9.1 mg/dL (ref 8.0–10.3)
Chloride: 103 mEq/L (ref 98–108)
GLUCOSE: 86 mg/dL (ref 73–118)
POTASSIUM: 4.5 meq/L (ref 3.3–4.7)
SODIUM: 138 meq/L (ref 128–145)
Total Bilirubin: 0.5 mg/dl (ref 0.20–1.60)
Total Protein: 7.7 g/dL (ref 6.4–8.1)

## 2015-03-18 LAB — RETICULOCYTES (CHCC)
ABS RETIC: 52.8 10*3/uL (ref 19.0–186.0)
RBC.: 2.64 MIL/uL — AB (ref 4.22–5.81)
Retic Ct Pct: 2 % (ref 0.4–2.3)

## 2015-03-18 LAB — IRON AND TIBC CHCC
%SAT: 37 % (ref 20–55)
Iron: 123 ug/dL (ref 42–163)
TIBC: 336 ug/dL (ref 202–409)
UIBC: 213 ug/dL (ref 117–376)

## 2015-03-18 LAB — FERRITIN CHCC: Ferritin: 355 ng/ml — ABNORMAL HIGH (ref 22–316)

## 2015-03-18 LAB — CHCC SATELLITE - SMEAR

## 2015-03-18 LAB — TECHNOLOGIST REVIEW CHCC SATELLITE: Tech Review: 4

## 2015-03-18 MED ORDER — DARBEPOETIN ALFA 500 MCG/ML IJ SOSY
400.0000 ug | PREFILLED_SYRINGE | Freq: Once | INTRAMUSCULAR | Status: AC
  Administered 2015-03-18: 400 ug via SUBCUTANEOUS

## 2015-03-18 MED ORDER — DARBEPOETIN ALFA 500 MCG/ML IJ SOSY
PREFILLED_SYRINGE | INTRAMUSCULAR | Status: AC
  Filled 2015-03-18: qty 1

## 2015-03-18 NOTE — Progress Notes (Signed)
Hematology and Oncology Follow Up Visit  Anthony [name] [id number] 19-Nov-1940 74 y.o. 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  Anthony [name] is back for follow-up. He does feel a little bit tired. However, whenever he gets his Aranesp, he begins to feel much better.   When we last saw him back in early September, his ferritin was 361 with iron saturation of 40%.  He's had a problem with fever. He's had no problems with bowels or bladder. He's had a little bit of leg swelling. He's had no rashes. He's had no fever.  Overall, his performance status is ECOG 1.    Medications:  Current outpatient prescriptions:  .  Albuterol Sulfate (PROAIR RESPICLICK) 569 (90 BASE) MCG/ACT AEPB, Inhale 2 puffs into the lungs every 6 (six) hours as needed., Disp: , Rfl:  .  aspirin 81 MG tablet, Take 81 mg by mouth daily., Disp: , Rfl:  .  bimatoprost (LUMIGAN) 0.01 % SOLN, Place 1 drop into both eyes at bedtime., Disp: , Rfl:  .  budesonide-formoterol (SYMBICORT) 160-4.5 MCG/ACT inhaler, Inhale 2 puffs into the lungs 2 (two) times daily., Disp: 3 Inhaler, Rfl: 3 .  Cholecalciferol (VITAMIN D3) 5000 UNITS TABS, Take by mouth daily., Disp: , Rfl:  .  clopidogrel (PLAVIX) 75 MG tablet, Take 75 mg by mouth daily. , Disp: , Rfl:  .  co-enzyme Q-10 30 MG capsule, Take 100 mg by mouth daily., Disp: , Rfl:  .  Cyanocobalamin (VITAMIN B-12) 2500 MCG TABS, Take 5,000 mcg by mouth daily., Disp: 90 tablet, Rfl: 9 .  fish oil-omega-3 fatty acids 1000 MG capsule, Take 1 g by mouth daily., Disp: , Rfl:  .  Flaxseed, Linseed, (FLAX SEED OIL) 1000 MG CAPS, Take 1,000 mg by mouth daily., Disp: , Rfl:  .  losartan (COZAAR) 50 MG tablet, 25 mg daily., Disp: , Rfl:  .  ranitidine (ZANTAC) 300 MG tablet, Take 300 mg by mouth at bedtime. , Disp: , Rfl:  .  sertraline  (ZOLOFT) 100 MG tablet, Take 100 mg by mouth daily., Disp: , Rfl: 1 .  simvastatin (ZOCOR) 40 MG tablet, Take 40 mg by mouth every other day. , Disp: , Rfl:  .  tiotropium (SPIRIVA) 18 MCG inhalation capsule, Place 18 mcg into inhaler and inhale daily., Disp: , Rfl:   Allergies: No Known Allergies  Past Medical History, Surgical history, Social history, and Family History were reviewed and updated.  Review of Systems: As above  Physical Exam:  height is 6' (1.829 m) and weight is 222 lb (100.699 kg). His oral temperature is 97.6 F (36.4 C). His blood pressure is 138/65 and his pulse is 93. His respiration is 18.   Well-developed and well-nourished white general. Head and neck exam shows no ocular or oral lesions. He has no scleral icterus. He has no adenopathy in the neck. Lungs are clear. Cardiac exam regular rate and rhythm with no murmurs, rubs or bruits. Abdomen is soft. He has good bowel sounds. He's mildly obese. He has no fluid wave. There is no palpable liver or spleen tip. Back exam shows no tenderness over the spine, ribs or hips. Extremities shows no clubbing, cyanosis or edema. Skin exam no rashes, ecchymoses or petechia.  Lab Results  Component Value Date   WBC 2.7* 03/18/2015  HGB 9.9* 03/18/2015   HCT 30.1* 03/18/2015   MCV 119* 03/18/2015   PLT 229 03/18/2015     Chemistry      Component Value Date/Time   NA 138 03/18/2015 1011   NA 140 01/12/2015 1206   NA 140 10/26/2014 1111   K 4.5 03/18/2015 1011   K 4.9 01/12/2015 1206   K 4.4 10/26/2014 1111   CL 103 03/18/2015 1011   CL 103 10/26/2014 1111   CO2 27 03/18/2015 1011   CO2 26 01/12/2015 1206   CO2 24 10/26/2014 1111   BUN 19 03/18/2015 1011   BUN 21.1 01/12/2015 1206   BUN 24* 10/26/2014 1111   CREATININE 1.7* 03/18/2015 1011   CREATININE 1.5* 01/12/2015 1206   CREATININE 1.44* 10/26/2014 1111      Component Value Date/Time   CALCIUM 9.1 03/18/2015 1011   CALCIUM 8.6 01/12/2015 1206   CALCIUM 8.8  10/26/2014 1111   ALKPHOS 37 03/18/2015 1011   ALKPHOS 41 01/12/2015 1206   ALKPHOS 36* 10/26/2014 1111   AST 28 03/18/2015 1011   AST 22 01/12/2015 1206   AST 19 10/26/2014 1111   ALT 23 03/18/2015 1011   ALT 19 01/12/2015 1206   ALT 15 10/26/2014 1111   BILITOT 0.50 03/18/2015 1011   BILITOT 0.41 01/12/2015 1206   BILITOT 0.4 10/26/2014 1111         Impression and Plan: [name] is 74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 He has refractory anemia. He has normal cytogenetics. I would classify him as low risk. Overall, he's done pretty well with it. He has improved since we first saw him.  His white cell count is slowly trending downward. 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  It is certainly conceivable that we may have to do a bone marrow test on him again. His last bone marrow test was back in August 2015.  We will see what his blood counts look like next time he is in. I think we find that his white cell count is dropping, then we may have to consider doing another bone marrow test to see if he does have any changes or any evidence of progression.Volanda Napoleon, MD 10/7/201611:24 AM

## 2015-04-12 HISTORY — PX: OTHER SURGICAL HISTORY: SHX169

## 2015-04-20 ENCOUNTER — Ambulatory Visit: Payer: Medicare Other | Admitting: Hematology & Oncology

## 2015-04-20 ENCOUNTER — Other Ambulatory Visit: Payer: Medicare Other

## 2015-04-20 ENCOUNTER — Ambulatory Visit: Payer: Medicare Other

## 2015-04-21 ENCOUNTER — Ambulatory Visit (HOSPITAL_BASED_OUTPATIENT_CLINIC_OR_DEPARTMENT_OTHER): Payer: Medicare Other

## 2015-04-21 ENCOUNTER — Ambulatory Visit (HOSPITAL_BASED_OUTPATIENT_CLINIC_OR_DEPARTMENT_OTHER): Payer: Medicare Other | Admitting: Hematology & Oncology

## 2015-04-21 ENCOUNTER — Encounter: Payer: Self-pay | Admitting: Hematology & Oncology

## 2015-04-21 VITALS — BP 165/72 | HR 85 | Temp 97.1°F | Resp 16 | Ht 72.0 in | Wt 224.0 lb

## 2015-04-21 DIAGNOSIS — D464 Refractory anemia, unspecified: Secondary | ICD-10-CM | POA: Diagnosis not present

## 2015-04-21 DIAGNOSIS — D4620 Refractory anemia with excess of blasts, unspecified: Secondary | ICD-10-CM

## 2015-04-21 LAB — CBC WITH DIFFERENTIAL (CANCER CENTER ONLY)
BASO#: 0 10*3/uL (ref 0.0–0.2)
BASO%: 0.3 % (ref 0.0–2.0)
EOS%: 1.3 % (ref 0.0–7.0)
Eosinophils Absolute: 0 10*3/uL (ref 0.0–0.5)
HEMATOCRIT: 28.6 % — AB (ref 38.7–49.9)
HGB: 9.3 g/dL — ABNORMAL LOW (ref 13.0–17.1)
LYMPH#: 1 10*3/uL (ref 0.9–3.3)
LYMPH%: 31.5 % (ref 14.0–48.0)
MCH: 38.9 pg — ABNORMAL HIGH (ref 28.0–33.4)
MCHC: 32.5 g/dL (ref 32.0–35.9)
MCV: 120 fL — AB (ref 82–98)
MONO#: 1.1 10*3/uL — ABNORMAL HIGH (ref 0.1–0.9)
MONO%: 36.4 % — ABNORMAL HIGH (ref 0.0–13.0)
NEUT#: 0.9 10*3/uL — ABNORMAL LOW (ref 1.5–6.5)
NEUT%: 30.5 % — AB (ref 40.0–80.0)
PLATELETS: 187 10*3/uL (ref 145–400)
RBC: 2.39 10*6/uL — ABNORMAL LOW (ref 4.20–5.70)
RDW: 14.2 % (ref 11.1–15.7)
WBC: 3 10*3/uL — ABNORMAL LOW (ref 4.0–10.0)

## 2015-04-21 LAB — COMPREHENSIVE METABOLIC PANEL (CC13)
ALT: 14 U/L (ref 0–55)
AST: 18 U/L (ref 5–34)
Albumin: 3.5 g/dL (ref 3.5–5.0)
Alkaline Phosphatase: 40 U/L (ref 40–150)
Anion Gap: 8 mEq/L (ref 3–11)
BUN: 18.1 mg/dL (ref 7.0–26.0)
CO2: 24 meq/L (ref 22–29)
Calcium: 8.4 mg/dL (ref 8.4–10.4)
Chloride: 106 mEq/L (ref 98–109)
Creatinine: 1.5 mg/dL — ABNORMAL HIGH (ref 0.7–1.3)
EGFR: 44 mL/min/{1.73_m2} — AB (ref >90)
GLUCOSE: 97 mg/dL (ref 70–140)
Potassium: 4.4 mEq/L (ref 3.5–5.1)
SODIUM: 138 meq/L (ref 136–145)
TOTAL PROTEIN: 7.4 g/dL (ref 6.4–8.3)

## 2015-04-21 LAB — CHCC SATELLITE - SMEAR

## 2015-04-21 LAB — TECHNOLOGIST REVIEW CHCC SATELLITE

## 2015-04-21 MED ORDER — DARBEPOETIN ALFA 500 MCG/ML IJ SOSY
PREFILLED_SYRINGE | INTRAMUSCULAR | Status: AC
  Filled 2015-04-21: qty 1

## 2015-04-21 MED ORDER — DARBEPOETIN ALFA 500 MCG/ML IJ SOSY
400.0000 ug | PREFILLED_SYRINGE | Freq: Once | INTRAMUSCULAR | Status: AC
  Administered 2015-04-21: 400 ug via SUBCUTANEOUS

## 2015-04-21 NOTE — Progress Notes (Signed)
Hematology and Oncology Follow Up Visit  Anthony [name] [id number] 06/29/1940 74 y.o. 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  Anthony [name] is back for follow-up. He does feel a little bit tired. He had an episode of syncope a couple weeks ago. He was in the kitchen at his home when this happened. Thankfully, he did not break anything and did not have any bleeding. He is on Plavix. He also is on baby aspirin.  He has had no fever. He has had no rashes. He has had no cough or shortness of breath.  He has had no problems with leg swelling. It is not any change in his medications.  We last saw him about a month ago, his ferritin was 355 with iron saturation of 37% .  He's had no change in bowel or bladder habits. There's not been any nausea or vomiting.  Overall, his performance status is ECOG 1.    Medications:  Current outpatient prescriptions:  .  Albuterol Sulfate (PROAIR RESPICLICK) 123XX123 (90 BASE) MCG/ACT AEPB, Inhale 2 puffs into the lungs every 6 (six) hours as needed., Disp: , Rfl:  .  aspirin 81 MG tablet, Take 81 mg by mouth daily., Disp: , Rfl:  .  bimatoprost (LUMIGAN) 0.01 % SOLN, Place 1 drop into both eyes at bedtime., Disp: , Rfl:  .  budesonide-formoterol (SYMBICORT) 160-4.5 MCG/ACT inhaler, Inhale 2 puffs into the lungs 2 (two) times daily., Disp: 3 Inhaler, Rfl: 3 .  Cholecalciferol (VITAMIN D3) 5000 UNITS TABS, Take by mouth daily., Disp: , Rfl:  .  clopidogrel (PLAVIX) 75 MG tablet, Take 75 mg by mouth daily. , Disp: , Rfl:  .  co-enzyme Q-10 30 MG capsule, Take 100 mg by mouth daily., Disp: , Rfl:  .  Cyanocobalamin (VITAMIN B-12) 2500 MCG TABS, Take 5,000 mcg by mouth daily., Disp: 90 tablet, Rfl: 9 .  fish oil-omega-3 fatty acids 1000 MG capsule, Take 1 g by mouth daily., Disp: , Rfl:  .  Flaxseed,  Linseed, (FLAX SEED OIL) 1000 MG CAPS, Take 1,000 mg by mouth daily., Disp: , Rfl:  .  losartan (COZAAR) 50 MG tablet, 25 mg daily., Disp: , Rfl:  .  pantoprazole (PROTONIX) 40 MG tablet, Take 40 mg by mouth daily., Disp: , Rfl:  .  ranitidine (ZANTAC) 300 MG tablet, Take 300 mg by mouth at bedtime. , Disp: , Rfl:  .  sertraline (ZOLOFT) 100 MG tablet, Take 100 mg by mouth daily., Disp: , Rfl: 1 .  simvastatin (ZOCOR) 40 MG tablet, Take 40 mg by mouth every other day. , Disp: , Rfl:  .  tiotropium (SPIRIVA) 18 MCG inhalation capsule, Place 18 mcg into inhaler and inhale daily., Disp: , Rfl:   Allergies: No Known Allergies  Past Medical History, Surgical history, Social history, and Family History were reviewed and updated.  Review of Systems: As above  Physical Exam:  height is 6' (1.829 m) and weight is 224 lb (101.606 kg). His oral temperature is 97.1 F (36.2 C). His blood pressure is 165/72 and his pulse is 85. His respiration is 16.   Well-developed and well-nourished white general. Head and neck exam shows no ocular or oral lesions. He has no scleral icterus. He has no adenopathy in the neck. Lungs are clear. Cardiac exam regular rate  and rhythm with no murmurs, rubs or bruits. Abdomen is soft. He has good bowel sounds. He's mildly obese. He has no fluid wave. There is no palpable liver or spleen tip. Back exam shows no tenderness over the spine, ribs or hips. Extremities shows no clubbing, cyanosis or edema. Skin exam no rashes, ecchymoses or petechia.  Lab Results  Component Value Date   WBC 3.0* 04/21/2015   HGB 9.3* 04/21/2015   HCT 28.6* 04/21/2015   MCV 120* 04/21/2015   PLT 187 04/21/2015     Chemistry      Component Value Date/Time   NA 138 03/18/2015 1011   NA 140 01/12/2015 1206   NA 140 10/26/2014 1111   K 4.5 03/18/2015 1011   K 4.9 01/12/2015 1206   K 4.4 10/26/2014 1111   CL 103 03/18/2015 1011   CL 103 10/26/2014 1111   CO2 27 03/18/2015 1011   CO2 26  01/12/2015 1206   CO2 24 10/26/2014 1111   BUN 19 03/18/2015 1011   BUN 21.1 01/12/2015 1206   BUN 24* 10/26/2014 1111   CREATININE 1.7* 03/18/2015 1011   CREATININE 1.5* 01/12/2015 1206   CREATININE 1.44* 10/26/2014 1111      Component Value Date/Time   CALCIUM 9.1 03/18/2015 1011   CALCIUM 8.6 01/12/2015 1206   CALCIUM 8.8 10/26/2014 1111   ALKPHOS 37 03/18/2015 1011   ALKPHOS 41 01/12/2015 1206   ALKPHOS 36* 10/26/2014 1111   AST 28 03/18/2015 1011   AST 22 01/12/2015 1206   AST 19 10/26/2014 1111   ALT 23 03/18/2015 1011   ALT 19 01/12/2015 1206   ALT 15 10/26/2014 1111   BILITOT 0.50 03/18/2015 1011   BILITOT 0.41 01/12/2015 1206   BILITOT 0.4 10/26/2014 1111         Impression and Plan: Mr. Hensel is 74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 He has refractory anemia. He has normal cytogenetics.   I looked at his blood smear. There were some suspicious looking cells that looked immature.  I think that we are going to have to do another bone marrow test on him. It has been over a year since she's had a bone marrow test.  I also want to send off the  NGS MDS panel. This might give [place] a better understanding as to what might be going on with the stem cells.  I will go ahead and give him some Aranesp today.  I taught him for about 30 minutes. I splinted him what the situation was and why felt that we had to do a bone marrow test. He understands and agrees. I will set the bone marrow test up for November 29.     Volanda Napoleon, MD 11/10/20164:00 PM

## 2015-04-21 NOTE — Patient Instructions (Signed)
Darbepoetin Alfa injection What is this medicine? DARBEPOETIN ALFA (dar be POE e tin AL fa) helps your body make more red blood cells. It is used to treat anemia caused by chronic kidney failure and chemotherapy. This medicine may be used for other purposes; ask your health care provider or pharmacist if you have questions. What should I tell my health care provider before I take this medicine? They need to know if you have any of these conditions: -blood clotting disorders or history of blood clots -cancer patient not on chemotherapy -cystic fibrosis -heart disease, such as angina, heart failure, or a history of a heart attack -hemoglobin level of 12 g/dL or greater -high blood pressure -low levels of folate, iron, or vitamin B12 -seizures -an unusual or allergic reaction to darbepoetin, erythropoietin, albumin, hamster proteins, latex, other medicines, foods, dyes, or preservatives -pregnant or trying to get pregnant -breast-feeding How should I use this medicine? This medicine is for injection into a vein or under the skin. It is usually given by a health care professional in a hospital or clinic setting. If you get this medicine at home, you will be taught how to prepare and give this medicine. Do not shake the solution before you withdraw a dose. Use exactly as directed. Take your medicine at regular intervals. Do not take your medicine more often than directed. It is important that you put your used needles and syringes in a special sharps container. Do not put them in a trash can. If you do not have a sharps container, call your pharmacist or healthcare provider to get one. Talk to your pediatrician regarding the use of this medicine in children. While this medicine may be used in children as young as 1 year for selected conditions, precautions do apply. Overdosage: If you think you have taken too much of this medicine contact a poison control center or emergency room at once. NOTE:  This medicine is only for you. Do not share this medicine with others. What if I miss a dose? If you miss a dose, take it as soon as you can. If it is almost time for your next dose, take only that dose. Do not take double or extra doses. What may interact with this medicine? Do not take this medicine with any of the following medications: -epoetin alfa This list may not describe all possible interactions. Give your health care provider a list of all the medicines, herbs, non-prescription drugs, or dietary supplements you use. Also tell them if you smoke, drink alcohol, or use illegal drugs. Some items may interact with your medicine. What should I watch for while using this medicine? Visit your prescriber or health care professional for regular checks on your progress and for the needed blood tests and blood pressure measurements. It is especially important for the doctor to make sure your hemoglobin level is in the desired range, to limit the risk of potential side effects and to give you the best benefit. Keep all appointments for any recommended tests. Check your blood pressure as directed. Ask your doctor what your blood pressure should be and when you should contact him or her. As your body makes more red blood cells, you may need to take iron, folic acid, or vitamin B supplements. Ask your doctor or health care provider which products are right for you. If you have kidney disease continue dietary restrictions, even though this medication can make you feel better. Talk with your doctor or health care professional about the   foods you eat and the vitamins that you take. What side effects may I notice from receiving this medicine? Side effects that you should report to your doctor or health care professional as soon as possible: -allergic reactions like skin rash, itching or hives, swelling of the face, lips, or tongue -breathing problems -changes in vision -chest pain -confusion, trouble speaking  or understanding -feeling faint or lightheaded, falls -high blood pressure -muscle aches or pains -pain, swelling, warmth in the leg -rapid weight gain -severe headaches -sudden numbness or weakness of the face, arm or leg -trouble walking, dizziness, loss of balance or coordination -seizures (convulsions) -swelling of the ankles, feet, hands -unusually weak or tired Side effects that usually do not require medical attention (report to your doctor or health care professional if they continue or are bothersome): -diarrhea -fever, chills (flu-like symptoms) -headaches -nausea, vomiting -redness, stinging, or swelling at site where injected This list may not describe all possible side effects. Call your doctor for medical advice about side effects. You may report side effects to FDA at 1-800-FDA-1088. Where should I keep my medicine? Keep out of the reach of children. Store in a refrigerator between 2 and 8 degrees C (36 and 46 degrees F). Do not freeze. Do not shake. Throw away any unused portion if using a single-dose vial. Throw away any unused medicine after the expiration date. NOTE: This sheet is a summary. It may not cover all possible information. If you have questions about this medicine, talk to your doctor, pharmacist, or health care provider.    2016, Elsevier/Gold Standard. (2008-05-11 10:23:57)  

## 2015-04-22 LAB — IRON AND TIBC CHCC
%SAT: 32 % (ref 20–55)
Iron: 99 ug/dL (ref 42–163)
TIBC: 310 ug/dL (ref 202–409)
UIBC: 211 ug/dL (ref 117–376)

## 2015-04-22 LAB — FERRITIN CHCC: Ferritin: 273 ng/ml (ref 22–316)

## 2015-04-22 LAB — RETICULOCYTES (CHCC)
ABS Retic: 39.2 10*3/uL (ref 19.0–186.0)
RBC.: 2.45 MIL/uL — ABNORMAL LOW (ref 4.22–5.81)
RETIC CT PCT: 1.6 % (ref 0.4–2.3)

## 2015-04-22 LAB — TESTOSTERONE: TESTOSTERONE: 202 ng/dL — AB (ref 300–890)

## 2015-05-06 LAB — MYLEDYSPLASTIC SYNDROME (MDS) PANEL

## 2015-05-10 ENCOUNTER — Ambulatory Visit (HOSPITAL_COMMUNITY)
Admission: RE | Admit: 2015-05-10 | Discharge: 2015-05-10 | Disposition: A | Discharge status: Home / Self Care | Payer: Medicare Other | Source: Ambulatory Visit | Attending: Hematology & Oncology | Admitting: Hematology & Oncology

## 2015-05-10 ENCOUNTER — Other Ambulatory Visit (HOSPITAL_COMMUNITY): Payer: Self-pay | Admitting: Hematology & Oncology

## 2015-05-10 ENCOUNTER — Encounter (HOSPITAL_COMMUNITY): Payer: Self-pay

## 2015-05-10 VITALS — BP 128/65 | HR 91 | Temp 97.7°F | Resp 16 | Ht 72.0 in | Wt 220.0 lb

## 2015-05-10 DIAGNOSIS — D4621 Refractory anemia with excess of blasts 1: Secondary | ICD-10-CM | POA: Insufficient documentation

## 2015-05-10 DIAGNOSIS — D469 Myelodysplastic syndrome, unspecified: Secondary | ICD-10-CM | POA: Diagnosis not present

## 2015-05-10 DIAGNOSIS — D4620 Refractory anemia with excess of blasts, unspecified: Secondary | ICD-10-CM | POA: Diagnosis present

## 2015-05-10 DIAGNOSIS — D539 Nutritional anemia, unspecified: Secondary | ICD-10-CM | POA: Diagnosis not present

## 2015-05-10 DIAGNOSIS — D72819 Decreased white blood cell count, unspecified: Secondary | ICD-10-CM | POA: Diagnosis not present

## 2015-05-10 LAB — CBC WITH DIFFERENTIAL/PLATELET
BASOS ABS: 0 10*3/uL (ref 0.0–0.1)
Basophils Relative: 0 %
EOS ABS: 0 10*3/uL (ref 0.0–0.7)
EOS PCT: 1 %
HCT: 29.8 % — ABNORMAL LOW (ref 39.0–52.0)
Hemoglobin: 9.7 g/dL — ABNORMAL LOW (ref 13.0–17.0)
LYMPHS ABS: 0.9 10*3/uL (ref 0.7–4.0)
Lymphocytes Relative: 32 %
MCH: 38 pg — ABNORMAL HIGH (ref 26.0–34.0)
MCHC: 32.6 g/dL (ref 30.0–36.0)
MCV: 116.9 fL — AB (ref 78.0–100.0)
MONO ABS: 1 10*3/uL (ref 0.1–1.0)
Monocytes Relative: 36 %
NEUTROS PCT: 31 %
Neutro Abs: 0.8 10*3/uL — ABNORMAL LOW (ref 1.7–7.7)
PLATELETS: 191 10*3/uL (ref 150–400)
RBC: 2.55 MIL/uL — ABNORMAL LOW (ref 4.22–5.81)
RDW: 15 % (ref 11.5–15.5)
WBC: 2.7 10*3/uL — AB (ref 4.0–10.5)

## 2015-05-10 LAB — BONE MARROW EXAM

## 2015-05-10 MED ORDER — SODIUM CHLORIDE 0.9 % IV SOLN
Freq: Once | INTRAVENOUS | Status: AC
  Administered 2015-05-10: 07:00:00 via INTRAVENOUS

## 2015-05-10 MED ORDER — MIDAZOLAM HCL 5 MG/ML IJ SOLN
10.0000 mg | Freq: Once | INTRAMUSCULAR | Status: DC
  Filled 2015-05-10: qty 2

## 2015-05-10 MED ORDER — MEPERIDINE HCL 50 MG/ML IJ SOLN
50.0000 mg | Freq: Once | INTRAMUSCULAR | Status: DC
  Filled 2015-05-10: qty 1

## 2015-05-10 MED ORDER — MEPERIDINE HCL 25 MG/ML IJ SOLN
INTRAMUSCULAR | Status: AC | PRN
  Administered 2015-05-10: 25 mg via INTRAVENOUS

## 2015-05-10 MED ORDER — MIDAZOLAM HCL 5 MG/5ML IJ SOLN
INTRAMUSCULAR | Status: AC | PRN
  Administered 2015-05-10: 2.5 mg via INTRAVENOUS
  Administered 2015-05-10: 1 mg via INTRAVENOUS

## 2015-05-10 NOTE — Procedures (Signed)
Mr. Eddleman is largely short stay unit for a bone marrow biopsy and aspirate. This was to help as we evaluate his myelodysplasia.  His Mallimpati score is 2. His ASA class is 2.  We did the appropriate timeout procedure at 7:34 AM.  He had an IV placed peripherally.  He is placed onto his right side. He received a total of 3.5 mg of Versed and 25 mg a Demerol IV for sedation.  The left posterior iliac crest region was prepped and draped in sterile fashion. 5 mL of 1% lidocaine was admitted under the skin down to the periosteum. A scalpel was used to make an incision into the skin.  We then use a aspirate needle. We obtained 2 bone marrow aspirates without difficulty We then used a bone marrow biopsy needle. We obtained an excellent bone marrow biopsy core.  We then cleaned and dressed the procedure site sterilely. The patient tolerated the procedure well. There were no complications.  I got his wife. I brought her back to the procedure room. I answered her questions.  Laurey Arrow

## 2015-05-10 NOTE — Discharge Instructions (Signed)
Moderate Conscious Sedation, Adult, Care After °Refer to this sheet in the next few weeks. These instructions provide you with information on caring for yourself after your procedure. Your health care provider may also give you more specific instructions. Your treatment has been planned according to current medical practices, but problems sometimes occur. Call your health care provider if you have any problems or questions after your procedure. °WHAT TO EXPECT AFTER THE PROCEDURE  °After your procedure: °· You may feel sleepy, clumsy, and have poor balance for several hours. °· Vomiting may occur if you eat too soon after the procedure. °HOME CARE INSTRUCTIONS °· Do not participate in any activities where you could become injured for at least 24 hours. Do not: °¨ Drive. °¨ Swim. °¨ Ride a bicycle. °¨ Operate heavy machinery. °¨ Cook. °¨ Use power tools. °¨ Climb ladders. °¨ Work from a high place. °· Do not make important decisions or sign legal documents until you are improved. °· If you vomit, drink water, juice, or soup when you can drink without vomiting. Make sure you have little or no nausea before eating solid foods. °· Only take over-the-counter or prescription medicines for pain, discomfort, or fever as directed by your health care provider. °· Make sure you and your family fully understand everything about the medicines given to you, including what side effects may occur. °· You should not drink alcohol, take sleeping pills, or take medicines that cause drowsiness for at least 24 hours. °· If you smoke, do not smoke without supervision. °· If you are feeling better, you may resume normal activities 24 hours after you were sedated. °· Keep all appointments with your health care provider. °SEEK MEDICAL CARE IF: °· Your skin is pale or bluish in color. °· You continue to feel nauseous or vomit. °· Your pain is getting worse and is not helped by medicine. °· You have bleeding or swelling. °· You are still  sleepy or feeling clumsy after 24 hours. °SEEK IMMEDIATE MEDICAL CARE IF: °· You develop a rash. °· You have difficulty breathing. °· You develop any type of allergic problem. °· You have a fever. °MAKE SURE YOU: °· Understand these instructions. °· Will watch your condition. °· Will get help right away if you are not doing well or get worse. °  °This information is not intended to replace advice given to you by your health care provider. Make sure you discuss any questions you have with your health care provider. °  °Document Released: 03/18/2013 Document Revised: 06/18/2014 Document Reviewed: 03/18/2013 °Elsevier Interactive Patient Education ©2016 Elsevier Inc. °Bone Marrow Aspiration and Bone Marrow Biopsy, Care After °Refer to this sheet in the next few weeks. These instructions provide you with information about caring for yourself after your procedure. Your health care provider may also give you more specific instructions. Your treatment has been planned according to current medical practices, but problems sometimes occur. Call your health care provider if you have any problems or questions after your procedure. °WHAT TO EXPECT AFTER THE PROCEDURE °After your procedure, it is common to have: °· Soreness or tenderness around the puncture site. °· Bruising. °HOME CARE INSTRUCTIONS °· Take medicines only as directed by your health care provider. °· Follow your health care provider's instructions about: °¨ Puncture site care. °¨ Bandage (dressing) changes and removal. °· Bathe and shower as directed by your health care provider. °· Check your puncture site every day for signs of infection. Watch for: °¨ Redness, swelling, or pain. °¨   Fluid, blood, or pus. °· Return to your normal activities as directed by your health care provider. °· Keep all follow-up visits as directed by your health care provider. This is important. °SEEK MEDICAL CARE IF: °· You have a fever. °· You have uncontrollable bleeding. °· You have  redness, swelling, or pain at the site of your puncture. °· You have fluid, blood, or pus coming from your puncture site. °  °This information is not intended to replace advice given to you by your health care provider. Make sure you discuss any questions you have with your health care provider. °  °Document Released: 12/15/2004 Document Revised: 10/12/2014 Document Reviewed: 05/19/2014 °Elsevier Interactive Patient Education ©2016 Elsevier Inc. ° °

## 2015-05-11 ENCOUNTER — Encounter: Payer: Self-pay | Admitting: Hematology & Oncology

## 2015-05-18 LAB — CHROMOSOME ANALYSIS, BONE MARROW

## 2015-05-18 LAB — TISSUE HYBRIDIZATION (BONE MARROW)-NCBH

## 2015-05-23 ENCOUNTER — Ambulatory Visit (INDEPENDENT_AMBULATORY_CARE_PROVIDER_SITE_OTHER): Payer: Medicare Other | Admitting: Pulmonary Disease

## 2015-05-23 ENCOUNTER — Encounter: Payer: Self-pay | Admitting: Pulmonary Disease

## 2015-05-23 VITALS — BP 134/66 | HR 88 | Ht 72.0 in | Wt 223.0 lb

## 2015-05-23 DIAGNOSIS — J438 Other emphysema: Secondary | ICD-10-CM

## 2015-05-23 NOTE — Progress Notes (Signed)
Subjective:    Patient ID: Anthony [name], male    DOB: 10-11-1940, 74 y.o.   MRN: [id number]  Synopsis: COPD formerly followed by Dr. Gwenette Greet Quit smoking around 1998 after smoking 2 ppd for about 40 years. CT 2011:  No significant emphysematous changes.  Anthony [name] 05/2012:  FEV1 1.67 (46%), ratio 60, decreased FVC either due to restriction or airtrapping.   No improvement with the addition of arcapta No different on stiolto Myocardial perfusion imaging study 09/2013:  Low risk study breo 100 added to spiriva 06/16/14 >> no better. Anthony [name] 09/2014:  FEV1 1.73 (49%), ratio 60.  Symbicort trial 09/2014 >> +response  HPI Chief Complaint  Patient presents with  . Follow-up    former [facility] pt being treated for emphysema.  Pt has good and bad days-bad days consist of increased sob, nonprod cough.  CAT score 21.   Anthony [name] is here for a one year follow up. His dyspnea has not progressed in the last year, about the same. Has a cough from an itchy throat, clear mucus, no worse. He has dyspnea when he climbs a flight of stairs.  Takes Spiriva daily, symbicort prn, about 3-4 times per week. Not using oxygen. He has worsening dyspnea when he gets out of the shower and feels "absolutely drained". He will be very short of breath and have to lay down on in the bed. He bought a humifier recently and he says that  He has a lot of trouble with dry mouth at night, much better with the humidifier.  His shower was better this AM.   Past Medical History  Diagnosis Date  . HTN (hypertension)   . Hyperlipidemia   . Coronary atherosclerosis of native coronary artery   . LPRD (laryngopharyngeal reflux disease)   . Depressive disorder   . 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 Iron deficiency anemia, unspecified 01/15/2014      Review of Systems     Objective:   Physical Exam  Filed Vitals:   05/23/15 1032  BP: 134/66  Pulse: 88  Height: 6' (1.829 m)  Weight: 223 lb (101.152 kg)  SpO2: 97%    Gen: well appearing HENT: OP clear, neck supple PULM: CTA B, normal percussion CV: RRR, no mgr, trace edema GI: BS+, soft, nontender Derm: no cyanosis or rash Psyche: normal mood and affect  CBC    Component Value Date/Time   WBC 2.7* 05/10/2015 0700   WBC 3.0* 04/21/2015 1348   RBC 2.55* 05/10/2015 0700   RBC 2.45* 04/21/2015 1349   RBC 2.39* 04/21/2015 1348   HGB 9.7* 05/10/2015 0700   HGB 9.3* 04/21/2015 1348   HCT 29.8* 05/10/2015 0700   HCT 28.6* 04/21/2015 1348   PLT 191 05/10/2015 0700   PLT 187 04/21/2015 1348   MCV 116.9* 05/10/2015 0700   MCV 120* 04/21/2015 1348   MCH 38.0* 05/10/2015 0700   MCH 38.9* 04/21/2015 1348   MCHC 32.6 05/10/2015 0700   MCHC 32.5 04/21/2015 1348   RDW 15.0 05/10/2015 0700   RDW 14.2 04/21/2015 1348   LYMPHSABS 0.9 05/10/2015 0700   LYMPHSABS 1.0 04/21/2015 1348   MONOABS 1.0 05/10/2015 0700   EOSABS 0.0 05/10/2015 0700   EOSABS 0.0 04/21/2015 1348   BASOSABS 0.0 05/10/2015 0700   BASOSABS 0.0 04/21/2015 1348         Assessment & Plan:  COPD (chronic obstructive pulmonary disease) ([place] This has been a stable interval for Anthony [name] despite his severe COPD. He is  no longer smoking. His immunizations are up-to-date. He has a bit of an atypical medication regimen and that he uses Symbicort as needed. He uses Spiriva regularly. Because his symptoms have been so well controlled and he has not had an exacerbation I am not inclined to make a change today. However, I did explain that Symbicort will not provide immediate relief and hand are stands that he would need to use his albuterol should he have an abrupt worsening of symptoms.  Plan: Continue Spiriva and Symbicort Follow-up one year or sooner if needed     Current outpatient prescriptions:  .  Albuterol Sulfate (PROAIR RESPICLICK) [id number] (90 BASE) MCG/ACT AEPB, Inhale 2 puffs into the lungs every 6 (six) hours as needed., Disp: , Rfl:  .  aspirin 81 MG tablet, Take 81 mg by mouth  daily., Disp: , Rfl:  .  bimatoprost (LUMIGAN) 0.01 % SOLN, Place 1 drop into both eyes at bedtime., Disp: , Rfl:  .  budesonide-formoterol (SYMBICORT) 160-4.5 MCG/ACT inhaler, Inhale 2 puffs into the lungs 2 (two) times daily., Disp: 3 Inhaler, Rfl: 3 .  Cholecalciferol (VITAMIN D3) 5000 UNITS TABS, Take by mouth daily., Disp: , Rfl:  .  clopidogrel (PLAVIX) 75 MG tablet, Take 75 mg by mouth daily. , Disp: , Rfl:  .  co-enzyme Q-10 30 MG capsule, Take 100 mg by mouth daily., Disp: , Rfl:  .  Cyanocobalamin (VITAMIN B-12) 2500 MCG TABS, Take 5,000 mcg by mouth daily., Disp: 90 tablet, Rfl: 9 .  fish oil-omega-3 fatty acids 1000 MG capsule, Take 1 g by mouth daily., Disp: , Rfl:  .  Flaxseed, Linseed, (FLAX SEED OIL) 1000 MG CAPS, Take 1,000 mg by mouth daily., Disp: , Rfl:  .  losartan (COZAAR) 50 MG tablet, 25 mg daily., Disp: , Rfl:  .  pantoprazole (PROTONIX) 40 MG tablet, Take 40 mg by mouth daily., Disp: , Rfl:  .  ranitidine (ZANTAC) 300 MG tablet, Take 300 mg by mouth at bedtime. , Disp: , Rfl:  .  sertraline (ZOLOFT) 100 MG tablet, Take 100 mg by mouth daily., Disp: , Rfl: 1 .  simvastatin (ZOCOR) 40 MG tablet, Take 40 mg by mouth every other day. , Disp: , Rfl:  .  tiotropium (SPIRIVA) 18 MCG inhalation capsule, Place 18 mcg into inhaler and inhale daily., Disp: , Rfl:

## 2015-05-23 NOTE — Assessment & Plan Note (Signed)
This has been a stable interval for Anthony [name] despite his severe COPD. He is no longer smoking. His immunizations are up-to-date. He has a bit of an atypical medication regimen and that he uses Symbicort as needed. He uses Spiriva regularly. Because his symptoms have been so well controlled and he has not had an exacerbation I am not inclined to make a change today. However, I did explain that Symbicort will not provide immediate relief and hand are stands that he would need to use his albuterol should he have an abrupt worsening of symptoms.  Plan: Continue Spiriva and Symbicort Follow-up one year or sooner if needed

## 2015-05-25 ENCOUNTER — Encounter: Payer: Self-pay | Admitting: Hematology & Oncology

## 2015-05-30 ENCOUNTER — Encounter: Payer: Self-pay | Admitting: Hematology & Oncology

## 2015-05-30 ENCOUNTER — Ambulatory Visit (HOSPITAL_BASED_OUTPATIENT_CLINIC_OR_DEPARTMENT_OTHER): Payer: Medicare Other

## 2015-05-30 ENCOUNTER — Ambulatory Visit (HOSPITAL_BASED_OUTPATIENT_CLINIC_OR_DEPARTMENT_OTHER): Payer: Medicare Other | Admitting: Hematology & Oncology

## 2015-05-30 ENCOUNTER — Other Ambulatory Visit (HOSPITAL_BASED_OUTPATIENT_CLINIC_OR_DEPARTMENT_OTHER): Payer: Medicare Other

## 2015-05-30 VITALS — BP 121/59 | HR 83 | Temp 97.9°F | Resp 16 | Ht 72.0 in | Wt 221.0 lb

## 2015-05-30 DIAGNOSIS — D4621 Refractory anemia with excess of blasts 1: Secondary | ICD-10-CM

## 2015-05-30 DIAGNOSIS — D4620 Refractory anemia with excess of blasts, unspecified: Secondary | ICD-10-CM

## 2015-05-30 LAB — COMPREHENSIVE METABOLIC PANEL
ALBUMIN: 3.7 g/dL (ref 3.5–5.0)
ALT: 12 U/L (ref 0–55)
ANION GAP: 9 meq/L (ref 3–11)
AST: 16 U/L (ref 5–34)
Alkaline Phosphatase: 45 U/L (ref 40–150)
BILIRUBIN TOTAL: 0.39 mg/dL (ref 0.20–1.20)
BUN: 28.1 mg/dL — AB (ref 7.0–26.0)
CALCIUM: 9 mg/dL (ref 8.4–10.4)
CO2: 26 mEq/L (ref 22–29)
CREATININE: 1.8 mg/dL — AB (ref 0.7–1.3)
Chloride: 104 mEq/L (ref 98–109)
EGFR: 36 mL/min/{1.73_m2} — ABNORMAL LOW (ref >90)
Glucose: 96 mg/dl (ref 70–140)
Potassium: 4.7 mEq/L (ref 3.5–5.1)
Sodium: 139 mEq/L (ref 136–145)
TOTAL PROTEIN: 8.3 g/dL (ref 6.4–8.3)

## 2015-05-30 LAB — CBC WITH DIFFERENTIAL (CANCER CENTER ONLY)
BASO#: 0 10*3/uL (ref 0.0–0.2)
BASO%: 0.6 % (ref 0.0–2.0)
EOS ABS: 0 10*3/uL (ref 0.0–0.5)
EOS%: 0.6 % (ref 0.0–7.0)
HCT: 27.3 % — ABNORMAL LOW (ref 38.7–49.9)
HGB: 8.8 g/dL — ABNORMAL LOW (ref 13.0–17.1)
LYMPH#: 0.9 10*3/uL (ref 0.9–3.3)
LYMPH%: 28.3 % (ref 14.0–48.0)
MCH: 38.6 pg — AB (ref 28.0–33.4)
MCHC: 32.2 g/dL (ref 32.0–35.9)
MCV: 120 fL — AB (ref 82–98)
MONO#: 1.2 10*3/uL — ABNORMAL HIGH (ref 0.1–0.9)
MONO%: 36.1 % — AB (ref 0.0–13.0)
NEUT#: 1.1 10*3/uL — ABNORMAL LOW (ref 1.5–6.5)
NEUT%: 34.4 % — AB (ref 40.0–80.0)
PLATELETS: 168 10*3/uL (ref 145–400)
RBC: 2.28 10*6/uL — AB (ref 4.20–5.70)
RDW: 14.5 % (ref 11.1–15.7)
WBC: 3.2 10*3/uL — AB (ref 4.0–10.0)

## 2015-05-30 LAB — RETICULOCYTES
ABS RETIC: 39.4 10*3/uL (ref 19.0–186.0)
RBC.: 2.32 MIL/uL — ABNORMAL LOW (ref 4.22–5.81)
Retic Ct Pct: 1.7 % (ref 0.4–2.3)

## 2015-05-30 LAB — CHCC SATELLITE - SMEAR

## 2015-05-30 LAB — LACTATE DEHYDROGENASE: LDH: 227 U/L (ref 125–245)

## 2015-05-30 MED ORDER — DARBEPOETIN ALFA 200 MCG/0.4ML IJ SOSY
PREFILLED_SYRINGE | INTRAMUSCULAR | Status: AC
  Filled 2015-05-30: qty 0.8

## 2015-05-30 MED ORDER — DARBEPOETIN ALFA 500 MCG/ML IJ SOSY
400.0000 ug | PREFILLED_SYRINGE | Freq: Once | INTRAMUSCULAR | Status: AC
  Administered 2015-05-30: 400 ug via SUBCUTANEOUS

## 2015-05-30 NOTE — Patient Instructions (Signed)
Darbepoetin Alfa injection What is this medicine? DARBEPOETIN ALFA (dar be POE e tin AL fa) helps your body make more red blood cells. It is used to treat anemia caused by chronic kidney failure and chemotherapy. This medicine may be used for other purposes; ask your health care provider or pharmacist if you have questions. What should I tell my health care provider before I take this medicine? They need to know if you have any of these conditions: -blood clotting disorders or history of blood clots -cancer patient not on chemotherapy -cystic fibrosis -heart disease, such as angina, heart failure, or a history of a heart attack -hemoglobin level of 12 g/dL or greater -high blood pressure -low levels of folate, iron, or vitamin B12 -seizures -an unusual or allergic reaction to darbepoetin, erythropoietin, albumin, hamster proteins, latex, other medicines, foods, dyes, or preservatives -pregnant or trying to get pregnant -breast-feeding How should I use this medicine? This medicine is for injection into a vein or under the skin. It is usually given by a health care professional in a hospital or clinic setting. If you get this medicine at home, you will be taught how to prepare and give this medicine. Do not shake the solution before you withdraw a dose. Use exactly as directed. Take your medicine at regular intervals. Do not take your medicine more often than directed. It is important that you put your used needles and syringes in a special sharps container. Do not put them in a trash can. If you do not have a sharps container, call your pharmacist or healthcare provider to get one. Talk to your pediatrician regarding the use of this medicine in children. While this medicine may be used in children as young as 1 year for selected conditions, precautions do apply. Overdosage: If you think you have taken too much of this medicine contact a poison control center or emergency room at once. NOTE:  This medicine is only for you. Do not share this medicine with others. What if I miss a dose? If you miss a dose, take it as soon as you can. If it is almost time for your next dose, take only that dose. Do not take double or extra doses. What may interact with this medicine? Do not take this medicine with any of the following medications: -epoetin alfa This list may not describe all possible interactions. Give your health care provider a list of all the medicines, herbs, non-prescription drugs, or dietary supplements you use. Also tell them if you smoke, drink alcohol, or use illegal drugs. Some items may interact with your medicine. What should I watch for while using this medicine? Visit your prescriber or health care professional for regular checks on your progress and for the needed blood tests and blood pressure measurements. It is especially important for the doctor to make sure your hemoglobin level is in the desired range, to limit the risk of potential side effects and to give you the best benefit. Keep all appointments for any recommended tests. Check your blood pressure as directed. Ask your doctor what your blood pressure should be and when you should contact him or her. As your body makes more red blood cells, you may need to take iron, folic acid, or vitamin B supplements. Ask your doctor or health care provider which products are right for you. If you have kidney disease continue dietary restrictions, even though this medication can make you feel better. Talk with your doctor or health care professional about the   foods you eat and the vitamins that you take. What side effects may I notice from receiving this medicine? Side effects that you should report to your doctor or health care professional as soon as possible: -allergic reactions like skin rash, itching or hives, swelling of the face, lips, or tongue -breathing problems -changes in vision -chest pain -confusion, trouble speaking  or understanding -feeling faint or lightheaded, falls -high blood pressure -muscle aches or pains -pain, swelling, warmth in the leg -rapid weight gain -severe headaches -sudden numbness or weakness of the face, arm or leg -trouble walking, dizziness, loss of balance or coordination -seizures (convulsions) -swelling of the ankles, feet, hands -unusually weak or tired Side effects that usually do not require medical attention (report to your doctor or health care professional if they continue or are bothersome): -diarrhea -fever, chills (flu-like symptoms) -headaches -nausea, vomiting -redness, stinging, or swelling at site where injected This list may not describe all possible side effects. Call your doctor for medical advice about side effects. You may report side effects to FDA at 1-800-FDA-1088. Where should I keep my medicine? Keep out of the reach of children. Store in a refrigerator between 2 and 8 degrees C (36 and 46 degrees F). Do not freeze. Do not shake. Throw away any unused portion if using a single-dose vial. Throw away any unused medicine after the expiration date. NOTE: This sheet is a summary. It may not cover all possible information. If you have questions about this medicine, talk to your doctor, pharmacist, or health care provider.    2016, Elsevier/Gold Standard. (2008-05-11 10:23:57)  

## 2015-05-30 NOTE — Progress Notes (Signed)
Hematology and Oncology Follow Up Visit  Anthony [name] [id number] Oct 16, 1940 74 y.o. 05/30/2015   Principle Diagnosis:   Refractory anemia with excess blasts (RAEB-1)  Current Therapy:    Vidaza 75 mg/m d1-5 - start 1/9  Aranesp 400g subcutaneous every month for hemoglobin less than 10     Interim History:  Mr.  [name] is back for follow-up. We did have to do a bone marrow biopsy on him. This was done on November 29. The pathology report ([id number]) showed refractory anemia with excess blasts. There were 9% blasts. As such, he is categorized as RAEB-1.    We do not have his cytogenetics back yet.   We did do a MDS panel on him. 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    He feels fairly well. He has had no problems with bleeding. He has occasional fatigue. His appetite has been good. He's had no nausea or vomiting.  There is no bone pain. He's had no leg swelling.   Overall, his performance status is ECOG 1.    Medications:  Current outpatient prescriptions:  .  Albuterol Sulfate (PROAIR RESPICLICK) 478 (90 BASE) MCG/ACT AEPB, Inhale 2 puffs into the lungs every 6 (six) hours as needed., Disp: , Rfl:  .  aspirin 81 MG tablet, Take 81 mg by mouth daily., Disp: , Rfl:  .  bimatoprost (LUMIGAN) 0.01 % SOLN, Place 1 drop into both eyes at bedtime., Disp: , Rfl:  .  budesonide-formoterol (SYMBICORT) 160-4.5 MCG/ACT inhaler, Inhale 2 puffs into the lungs 2 (two) times daily., Disp: 3 Inhaler, Rfl: 3 .  Cholecalciferol (VITAMIN D3) 5000 UNITS TABS, Take by mouth daily., Disp: , Rfl:  .  clopidogrel (PLAVIX) 75 MG tablet, Take 75 mg by mouth daily. , Disp: , Rfl:  .  co-enzyme Q-10 30 MG capsule, Take 100 mg by mouth daily., Disp: , Rfl:  .  Cyanocobalamin (VITAMIN B-12) 2500 MCG TABS, Take 5,000 mcg by mouth daily., Disp: 90 tablet, Rfl: 9 .  fish oil-omega-3 fatty acids 1000 MG capsule, Take 1 g by mouth daily., Disp: , Rfl:  .  Flaxseed, Linseed, (FLAX SEED  OIL) 1000 MG CAPS, Take 1,000 mg by mouth daily., Disp: , Rfl:  .  losartan (COZAAR) 50 MG tablet, 25 mg daily., Disp: , Rfl:  .  pantoprazole (PROTONIX) 40 MG tablet, Take 40 mg by mouth daily., Disp: , Rfl:  .  ranitidine (ZANTAC) 300 MG tablet, Take 300 mg by mouth at bedtime. , Disp: , Rfl:  .  sertraline (ZOLOFT) 100 MG tablet, Take 100 mg by mouth daily., Disp: , Rfl: 1 .  SIMBRINZA 1-0.2 % SUSP, , Disp: , Rfl:  .  simvastatin (ZOCOR) 40 MG tablet, Take 40 mg by mouth every other day. , Disp: , Rfl:  .  tiotropium (SPIRIVA) 18 MCG inhalation capsule, Place 18 mcg into inhaler and inhale daily., Disp: , Rfl:   Allergies: No Known Allergies  Past Medical History, Surgical history, Social history, and Family History were reviewed and updated.  Review of Systems: As above  Physical Exam:  height is 6' (1.829 m) and weight is 221 lb (100.245 kg). His oral temperature is 97.9 F (36.6 C). His blood pressure is 121/59 and his pulse is 83. His respiration is 16.   Well-developed and well-nourished white general. Head and neck exam shows no ocular or oral lesions. He has no scleral icterus. He has no adenopathy in the neck. Lungs are clear. Cardiac  exam regular rate and rhythm with no murmurs, rubs or bruits. Abdomen is soft. He has good bowel sounds. He's mildly obese. He has no fluid wave. There is no palpable liver or spleen tip. Back exam shows no tenderness over the spine, ribs or hips. Extremities shows no clubbing, cyanosis or edema. Skin exam no rashes, ecchymoses or petechia.  Lab Results  Component Value Date   WBC 3.2* 05/30/2015   HGB 8.8* 05/30/2015   HCT 27.3* 05/30/2015   MCV 120* 05/30/2015   PLT 168 05/30/2015     Chemistry      Component Value Date/Time   NA 138 04/21/2015 1349   NA 138 03/18/2015 1011   NA 140 10/26/2014 1111   K 4.4 04/21/2015 1349   K 4.5 03/18/2015 1011   K 4.4 10/26/2014 1111   CL 103 03/18/2015 1011   CL 103 10/26/2014 1111   CO2 24  04/21/2015 1349   CO2 27 03/18/2015 1011   CO2 24 10/26/2014 1111   BUN 18.1 04/21/2015 1349   BUN 19 03/18/2015 1011   BUN 24* 10/26/2014 1111   CREATININE 1.5* 04/21/2015 1349   CREATININE 1.7* 03/18/2015 1011   CREATININE 1.44* 10/26/2014 1111      Component Value Date/Time   CALCIUM 8.4 04/21/2015 1349   CALCIUM 9.1 03/18/2015 1011   CALCIUM 8.8 10/26/2014 1111   ALKPHOS 40 04/21/2015 1349   ALKPHOS 37 03/18/2015 1011   ALKPHOS 36* 10/26/2014 1111   AST 18 04/21/2015 1349   AST 28 03/18/2015 1011   AST 19 10/26/2014 1111   ALT 14 04/21/2015 1349   ALT 23 03/18/2015 1011   ALT 15 10/26/2014 1111   BILITOT <0.30 04/21/2015 1349   BILITOT 0.50 03/18/2015 1011   BILITOT 0.4 10/26/2014 1111         Impression and Plan: Anthony [name] is 74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 As such, I think we are going to have to initiate therapy on him.  I talked home for about 45 minutes. I explained to him and showed him the bone marrow report. I told him that if we did nothing, then he definitely would transform to acute leukemia with any year.   I explained the nature of chemotherapy for myelodysplasia. I explained how chemotherapy works. I think he would be a good candidate for treatment.   I think Vidaza would be a good choice for him. This has a good track record with myelo dysplasia.   I think a Port-A-Cath would really help [place] out. As such, I will see by getting a Port-A-Cath put in him after the holidays.   I went over some of side effects of treatment. He understands that he may need to be transfused. There is a risk of bleeding, infection, fatigue, nausea and vomiting.   He understands he is side effects. He also understands that using Vidaza the chance of improving his blood counts hopefully will be about 30-40%.   He agrees to try the [place].   I will go ahead  and give him a dose of Aranesp today.   We will have the Port-A-Cath placed on January 6 and then I will start treatment on January 9th   I would like to see him back about 2 weeks after the [place] so that we can see husband blood counts look.   Volanda Napoleon, MD 12/19/20162:42 PM

## 2015-05-31 LAB — FERRITIN: Ferritin: 297 ng/ml (ref 22–316)

## 2015-05-31 LAB — IRON AND TIBC
%SAT: 35 % (ref 20–55)
IRON: 116 ug/dL (ref 42–163)
TIBC: 334 ug/dL (ref 202–409)
UIBC: 218 ug/dL (ref 117–376)

## 2015-06-02 ENCOUNTER — Telehealth: Payer: Self-pay | Admitting: Interventional Cardiology

## 2015-06-02 NOTE — Telephone Encounter (Signed)
Routed to Dr.Smith to advise 

## 2015-06-02 NOTE — Telephone Encounter (Signed)
Request for surgical clearance:  1. What type of surgery is being performed? Port-a-cath placement   2. When is this surgery scheduled? n/a  3. Are there any medications that need to be held prior to surgery and how long?Plavix  4. Name of physician performing surgery? Ennever   5. What is your office phone and fax number? [phone] 6.

## 2015-06-09 ENCOUNTER — Other Ambulatory Visit (HOSPITAL_COMMUNITY): Payer: Self-pay | Admitting: Family Medicine

## 2015-06-09 DIAGNOSIS — N5089 Other specified disorders of the male genital organs: Secondary | ICD-10-CM

## 2015-06-15 ENCOUNTER — Other Ambulatory Visit: Payer: Self-pay | Admitting: Radiology

## 2015-06-17 ENCOUNTER — Other Ambulatory Visit: Payer: Self-pay | Admitting: Hematology & Oncology

## 2015-06-17 ENCOUNTER — Ambulatory Visit: Payer: Medicare Other

## 2015-06-17 ENCOUNTER — Ambulatory Visit (HOSPITAL_COMMUNITY)
Admission: RE | Admit: 2015-06-17 | Discharge: 2015-06-17 | Disposition: A | Discharge status: Home / Self Care | Payer: Medicare Other | Source: Ambulatory Visit | Attending: Hematology & Oncology | Admitting: Hematology & Oncology

## 2015-06-17 ENCOUNTER — Ambulatory Visit (HOSPITAL_COMMUNITY)
Admission: RE | Admit: 2015-06-17 | Discharge: 2015-06-17 | Disposition: A | Discharge status: Home / Self Care | Payer: Medicare Other | Source: Ambulatory Visit | Attending: Family Medicine | Admitting: Family Medicine

## 2015-06-17 ENCOUNTER — Encounter (HOSPITAL_COMMUNITY): Payer: Self-pay

## 2015-06-17 ENCOUNTER — Other Ambulatory Visit: Payer: Self-pay | Admitting: *Deleted

## 2015-06-17 DIAGNOSIS — Z7902 Long term (current) use of antithrombotics/antiplatelets: Secondary | ICD-10-CM | POA: Diagnosis not present

## 2015-06-17 DIAGNOSIS — N503 Cyst of epididymis: Secondary | ICD-10-CM | POA: Diagnosis not present

## 2015-06-17 DIAGNOSIS — D469 Myelodysplastic syndrome, unspecified: Secondary | ICD-10-CM | POA: Diagnosis present

## 2015-06-17 DIAGNOSIS — D4620 Refractory anemia with excess of blasts, unspecified: Secondary | ICD-10-CM

## 2015-06-17 DIAGNOSIS — Z7982 Long term (current) use of aspirin: Secondary | ICD-10-CM | POA: Diagnosis not present

## 2015-06-17 DIAGNOSIS — N189 Chronic kidney disease, unspecified: Secondary | ICD-10-CM | POA: Insufficient documentation

## 2015-06-17 DIAGNOSIS — E785 Hyperlipidemia, unspecified: Secondary | ICD-10-CM | POA: Diagnosis not present

## 2015-06-17 DIAGNOSIS — Z87891 Personal history of nicotine dependence: Secondary | ICD-10-CM | POA: Insufficient documentation

## 2015-06-17 DIAGNOSIS — D509 Iron deficiency anemia, unspecified: Secondary | ICD-10-CM | POA: Insufficient documentation

## 2015-06-17 DIAGNOSIS — F329 Major depressive disorder, single episode, unspecified: Secondary | ICD-10-CM | POA: Diagnosis not present

## 2015-06-17 DIAGNOSIS — N433 Hydrocele, unspecified: Secondary | ICD-10-CM | POA: Diagnosis not present

## 2015-06-17 DIAGNOSIS — N5089 Other specified disorders of the male genital organs: Secondary | ICD-10-CM

## 2015-06-17 DIAGNOSIS — K219 Gastro-esophageal reflux disease without esophagitis: Secondary | ICD-10-CM | POA: Insufficient documentation

## 2015-06-17 DIAGNOSIS — I129 Hypertensive chronic kidney disease with stage 1 through stage 4 chronic kidney disease, or unspecified chronic kidney disease: Secondary | ICD-10-CM | POA: Insufficient documentation

## 2015-06-17 DIAGNOSIS — Z8249 Family history of ischemic heart disease and other diseases of the circulatory system: Secondary | ICD-10-CM | POA: Insufficient documentation

## 2015-06-17 DIAGNOSIS — I2510 Atherosclerotic heart disease of native coronary artery without angina pectoris: Secondary | ICD-10-CM | POA: Diagnosis not present

## 2015-06-17 HISTORY — DX: Chronic kidney disease, unspecified: N18.9

## 2015-06-17 LAB — BASIC METABOLIC PANEL
Anion gap: 9 (ref 5–15)
BUN: 27 mg/dL — ABNORMAL HIGH (ref 6–20)
CALCIUM: 9 mg/dL (ref 8.9–10.3)
CO2: 25 mmol/L (ref 22–32)
CREATININE: 1.48 mg/dL — AB (ref 0.61–1.24)
Chloride: 107 mmol/L (ref 101–111)
GFR calc non Af Amer: 44 mL/min — ABNORMAL LOW (ref >60)
GFR, EST AFRICAN AMERICAN: 51 mL/min — AB (ref >60)
GLUCOSE: 112 mg/dL — AB (ref 65–99)
Potassium: 4.4 mmol/L (ref 3.5–5.1)
Sodium: 141 mmol/L (ref 135–145)

## 2015-06-17 LAB — CBC WITH DIFFERENTIAL/PLATELET
BASOS PCT: 0 %
Basophils Absolute: 0 10*3/uL (ref 0.0–0.1)
EOS ABS: 0 10*3/uL (ref 0.0–0.7)
Eosinophils Relative: 0 %
HCT: 25.5 % — ABNORMAL LOW (ref 39.0–52.0)
Hemoglobin: 8.3 g/dL — ABNORMAL LOW (ref 13.0–17.0)
Lymphocytes Relative: 32 %
Lymphs Abs: 0.9 10*3/uL (ref 0.7–4.0)
MCH: 38.2 pg — AB (ref 26.0–34.0)
MCHC: 32.5 g/dL (ref 30.0–36.0)
MCV: 117.5 fL — ABNORMAL HIGH (ref 78.0–100.0)
MONO ABS: 0.9 10*3/uL (ref 0.1–1.0)
Monocytes Relative: 33 %
NEUTROS ABS: 0.9 10*3/uL — AB (ref 1.7–7.7)
NEUTROS PCT: 35 %
PLATELETS: 154 10*3/uL (ref 150–400)
RBC: 2.17 MIL/uL — ABNORMAL LOW (ref 4.22–5.81)
RDW: 15.7 % — ABNORMAL HIGH (ref 11.5–15.5)
WBC: 2.7 10*3/uL — ABNORMAL LOW (ref 4.0–10.5)

## 2015-06-17 LAB — PROTIME-INR
INR: 1.07 (ref 0.00–1.49)
PROTHROMBIN TIME: 14.1 s (ref 11.6–15.2)

## 2015-06-17 MED ORDER — MIDAZOLAM HCL 2 MG/2ML IJ SOLN
INTRAMUSCULAR | Status: AC
  Filled 2015-06-17: qty 4

## 2015-06-17 MED ORDER — CEFAZOLIN SODIUM-DEXTROSE 2-3 GM-% IV SOLR
INTRAVENOUS | Status: AC
  Filled 2015-06-17: qty 50

## 2015-06-17 MED ORDER — LIDOCAINE HCL 1 % IJ SOLN
INTRAMUSCULAR | Status: AC
  Filled 2015-06-17: qty 20

## 2015-06-17 MED ORDER — MIDAZOLAM HCL 2 MG/2ML IJ SOLN
INTRAMUSCULAR | Status: AC | PRN
  Administered 2015-06-17: 1 mg via INTRAVENOUS

## 2015-06-17 MED ORDER — FENTANYL CITRATE (PF) 100 MCG/2ML IJ SOLN
INTRAMUSCULAR | Status: AC | PRN
  Administered 2015-06-17: 50 ug via INTRAVENOUS

## 2015-06-17 MED ORDER — LIDOCAINE-PRILOCAINE 2.5-2.5 % EX CREA: TOPICAL_CREAM | CUTANEOUS | Status: DC

## 2015-06-17 MED ORDER — HEPARIN SOD (PORK) LOCK FLUSH 100 UNIT/ML IV SOLN
INTRAVENOUS | Status: DC
Start: 2015-06-17 — End: 2015-06-18
  Filled 2015-06-17: qty 5

## 2015-06-17 MED ORDER — SODIUM CHLORIDE 0.9 % IV SOLN
INTRAVENOUS | Status: DC
  Administered 2015-06-17: 12:00:00 via INTRAVENOUS

## 2015-06-17 MED ORDER — LIDOCAINE-PRILOCAINE 2.5-2.5 % EX CREA: 1.0000 "application " | TOPICAL_CREAM | CUTANEOUS | Status: DC | PRN

## 2015-06-17 MED ORDER — CEFAZOLIN SODIUM-DEXTROSE 2-3 GM-% IV SOLR
2.0000 g | INTRAVENOUS | Status: AC
  Administered 2015-06-17: 2 g via INTRAVENOUS

## 2015-06-17 MED ORDER — FENTANYL CITRATE (PF) 100 MCG/2ML IJ SOLN
INTRAMUSCULAR | Status: AC
  Filled 2015-06-17: qty 4

## 2015-06-17 NOTE — H&P (Signed)
Chief Complaint: Patient was seen in consultation today for  Port-A-Cath placement Referring Physician(s): Ennever,Peter R  History of Present Illness: Anthony [name] is a 75 y.o. male with history of myelodysplasia  who presents today for Port-A-Cath placement for chemotherapy.  Past Medical History  Diagnosis Date  . HTN (hypertension)   . Hyperlipidemia   . Coronary atherosclerosis of native coronary artery   . LPRD (laryngopharyngeal reflux disease)   . Depressive disorder   . Iron deficiency anemia, unspecified 01/15/2014  . 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 Chronic kidney disease 2015    Past Surgical History  Procedure Laterality Date  . Angioplasty    . Tonsillectomy      as a child  . Eye surgery      cataract  . Bone marrow biopsy  2016  . Colonoscopy      Allergies: Review of patient's allergies indicates no known allergies.  Medications: Prior to Admission medications   Medication Sig Start Date End Date Taking? Authorizing Provider  buPROPion (WELLBUTRIN XL) 150 MG 24 hr tablet Take 150 mg by mouth daily.   Yes Historical Provider, MD  Albuterol Sulfate (PROAIR RESPICLICK) 712 (90 BASE) MCG/ACT AEPB Inhale 2 puffs into the lungs every 6 (six) hours as needed.    Historical Provider, MD  aspirin 81 MG tablet Take 81 mg by mouth daily.    Historical Provider, MD  bimatoprost (LUMIGAN) 0.01 % SOLN Place 1 drop into both eyes at bedtime.    Historical Provider, MD  budesonide-formoterol (SYMBICORT) 160-4.5 MCG/ACT inhaler Inhale 2 puffs into the lungs 2 (two) times daily. 11/12/14   Kathee Delton, MD  Cholecalciferol (VITAMIN D3) 5000 UNITS TABS Take by mouth daily.    Historical Provider, MD  clopidogrel (PLAVIX) 75 MG tablet Take 75 mg by mouth daily.  04/16/12   Historical Provider, MD  co-enzyme Q-10 30 MG capsule Take 100 mg by mouth daily.    Historical Provider, MD  Cyanocobalamin (VITAMIN B-12) 2500 MCG TABS Take 5,000 mcg by mouth daily.  06/16/14   Volanda Napoleon, MD  fish oil-omega-3 fatty acids 1000 MG capsule Take 1 g by mouth daily.    Historical Provider, MD  Flaxseed, Linseed, (FLAX SEED OIL) 1000 MG CAPS Take 1,000 mg by mouth daily.    Historical Provider, MD  lidocaine-prilocaine (EMLA) cream Apply one teaspoon to port-a-cath 1-2 hours prior to access. 06/17/15   Volanda Napoleon, MD  losartan (COZAAR) 50 MG tablet 25 mg daily. 11/16/14   Historical Provider, MD  pantoprazole (PROTONIX) 40 MG tablet Take 40 mg by mouth daily. 04/20/15   Historical Provider, MD  ranitidine (ZANTAC) 300 MG tablet Take 300 mg by mouth at bedtime.  05/26/12   Historical Provider, MD  sertraline (ZOLOFT) 100 MG tablet Take 100 mg by mouth daily. 11/16/14   Historical Provider, MD  SIMBRINZA 1-0.2 % SUSP  05/20/15   Historical Provider, MD  simvastatin (ZOCOR) 40 MG tablet Take 40 mg by mouth every other day.  03/25/12   Historical Provider, MD  tiotropium (SPIRIVA) 18 MCG inhalation capsule Place 18 mcg into inhaler and inhale daily.    Historical Provider, MD     Family History  Problem Relation Age of Onset  . Heart disease Father   . Hypertension Father   . Heart disease Mother     Social History   Social History  . Marital Status: Single    Spouse Name: N/A  .  Number of Children: 2  . Years of Education: N/A   Occupational History  . retired    Social History Main Topics  . Smoking status: Former Smoker -- 2.00 packs/day for 40 years    Types: Cigarettes    Start date: 08/05/1959    Quit date: 06/11/1998  . Smokeless tobacco: Never Used     Comment: quit smoking 15 years ago  . Alcohol Use: 0.0 oz/week    0 Standard drinks or equivalent per week     Comment: 4 cocktails qd  . Drug Use: No  . Sexual Activity: Not Asked   Other Topics Concern  . None   Social History Narrative     Review of Systems  Constitutional: Negative for fever and chills.  Respiratory:       Occasional cough and some dyspnea with exertion.    Cardiovascular: Negative for chest pain.  Gastrointestinal: Negative for nausea, vomiting, abdominal pain and blood in stool.  Genitourinary: Negative for dysuria and hematuria.       Some left inguinal discomfort with radiation into testicle  Musculoskeletal: Positive for back pain.  Neurological:       Occ HA's    Vital Signs: BP 125/65 mmHg  Pulse 88  Temp(Src) 97.9 F (36.6 C) (Oral)  Resp 18  SpO2 100%  Physical Exam  Constitutional: He is oriented to person, place, and time. He appears well-developed and well-nourished.  Cardiovascular: Normal rate and regular rhythm.   Pulmonary/Chest: Effort normal.  Distant breath sounds bilaterally  Abdominal: Soft. Bowel sounds are normal.  Mild left lower quadrant tenderness; obese  Musculoskeletal: Normal range of motion.  Trace pretibial edema bilaterally  Neurological: He is alert and oriented to person, place, and time.    Mallampati Score:     Imaging: No results found.  Labs:  CBC:  Recent Labs  03/18/15 1010 04/21/15 1348 05/10/15 0700 05/30/15 1252  WBC 2.7* 3.0* 2.7* 3.2*  HGB 9.9* 9.3* 9.7* 8.8*  HCT 30.1* 28.6* 29.8* 27.3*  PLT 229 187 191 168    COAGS:  Recent Labs  06/17/15 1155  INR 1.07    BMP:  Recent Labs  11/26/14 1025  02/15/15 0950 03/18/15 1011 04/21/15 1349 05/30/15 1254 06/17/15 1155  NA 138  < > 139 138 138 139 141  K 4.8*  < > 4.3 4.5 4.4 4.7 4.4  CL 103  --  104 103  --   --  107  CO2 27  < > '25 27 24 26 25  ' GLUCOSE 81  < > 84 86 97 96 112*  BUN 18  < > 20 19 18.1 28.1* 27*  CALCIUM 8.8  < > 9.0 9.1 8.4 9.0 9.0  CREATININE 1.3*  < > 1.5* 1.7* 1.5* 1.8* 1.48*  GFRNONAA  --   --   --   --   --   --  44*  GFRAA  --   --   --   --   --   --  51*  < > = values in this interval not displayed.  LIVER FUNCTION TESTS:  Recent Labs  02/15/15 0950 03/18/15 1011 04/21/15 1349 05/30/15 1254  BILITOT 0.50 0.50 <0.30 0.39  AST '28 28 18 16  ' ALT '20 23 14 12  ' ALKPHOS 41  37 40 45  PROT 8.1 7.7 7.4 8.3  ALBUMIN 3.4 3.5 3.5 3.7    TUMOR MARKERS: No results for input(s): AFPTM, CEA, CA199, CHROMGRNA in the last 8760  hours.  Assessment and Plan:  75 y.o. male with history of myelodysplasia  who presents today for Port-A-Cath placement for chemotherapy.Risks and benefits discussed with the patient including, but not limited to bleeding, infection, pneumothorax, or fibrin sheath development and need for additional procedures.All of the patient's questions were answered, patient is agreeable to proceed.Consent signed and in chart.     Thank you for this interesting consult.  I greatly enjoyed meeting HABIB KISE and look forward to participating in their care.  A copy of this report was sent to the requesting provider on this date.  Signed: D. Rowe Robert 06/17/2015, 1:31 PM   I spent a total of 15 minutes in face to face in clinical consultation, greater than 50% of which was counseling/coordinating care for Port-A-Cath placement

## 2015-06-17 NOTE — Progress Notes (Signed)
Transported pt to [place], Therapist, sports at bedside. Will continue to monitor.

## 2015-06-17 NOTE — Sedation Documentation (Signed)
Patient is resting comfortably. 

## 2015-06-17 NOTE — Progress Notes (Signed)
Monitoring pt in ultrasound post procedure. Pt tolerated procedure well. Alert and oriented x3. Pt states he is in no pain.

## 2015-06-17 NOTE — Procedures (Signed)
Interventional Radiology Procedure Note  Procedure: Placement of a right IJ approach single lumen PowerPort.  Tip is positioned at the superior cavoatrial junction and catheter is ready for immediate use.  Complications: No immediate Recommendations:  - Ok to shower tomorrow - Do not submerge for 7 days - Routine line care   Anthony [name], M.D Pager:  [phone]   

## 2015-06-17 NOTE — Discharge Instructions (Signed)
Moderate Conscious Sedation, Adult, Care After Refer to this sheet in the next few weeks. These instructions provide you with information on caring for yourself after your procedure. Your health care provider may also give you more specific instructions. Your treatment has been planned according to current medical practices, but problems sometimes occur. Call your health care provider if you have any problems or questions after your procedure. WHAT TO EXPECT AFTER THE PROCEDURE  After your procedure:  You may feel sleepy, clumsy, and have poor balance for several hours.  Vomiting may occur if you eat too soon after the procedure. HOME CARE INSTRUCTIONS  Do not participate in any activities where you could become injured for at least 24 hours. Do not:  Drive.  Swim.  Ride a bicycle.  Operate heavy machinery.  Cook.  Use power tools.  Climb ladders.  Work from a high place.  Do not make important decisions or sign legal documents until you are improved.  If you vomit, drink water, juice, or soup when you can drink without vomiting. Make sure you have little or no nausea before eating solid foods.  Only take over-the-counter or prescription medicines for pain, discomfort, or fever as directed by your health care provider.  Make sure you and your family fully understand everything about the medicines given to you, including what side effects may occur.  You should not drink alcohol, take sleeping pills, or take medicines that cause drowsiness for at least 24 hours.  If you smoke, do not smoke without supervision.  If you are feeling better, you may resume normal activities 24 hours after you were sedated.  Keep all appointments with your health care provider. SEEK MEDICAL CARE IF:  Your skin is pale or bluish in color.  You continue to feel nauseous or vomit.  Your pain is getting worse and is not helped by medicine.  You have bleeding or swelling.  You are still  sleepy or feeling clumsy after 24 hours. SEEK IMMEDIATE MEDICAL CARE IF:  You develop a rash.  You have difficulty breathing.  You develop any type of allergic problem.  You have a fever. MAKE SURE YOU:  Understand these instructions.  Will watch your condition.  Will get help right away if you are not doing well or get worse.   This information is not intended to replace advice given to you by your health care provider. Make sure you discuss any questions you have with your health care provider.   Document Released: 03/18/2013 Document Revised: 06/18/2014 Document Reviewed: 03/18/2013 Elsevier Interactive Patient Education 2016 [place] Insertion, Care After Refer to this sheet in the next few weeks. These instructions provide you with information on caring for yourself after your procedure. Your health care provider may also give you more specific instructions. Your treatment has been planned according to current medical practices, but problems sometimes occur. Call your health care provider if you have any problems or questions after your procedure. WHAT TO EXPECT AFTER THE PROCEDURE After your procedure, it is typical to have the following:   Discomfort at the port insertion site. Ice packs to the area will help.  Bruising on the skin over the port. This will subside in 3-4 days. HOME CARE INSTRUCTIONS  After your port is placed, you will get a manufacturer's information card. The card has information about your port. Keep this card with you at all times.   Know what kind of port you have. There are many  types of ports available.   Wear a medical alert bracelet in case of an emergency. This can help alert health care workers that you have a port.   The port can stay in for as long as your health care provider believes it is necessary.   A home health care nurse may give medicines and take care of the port.   You or a family member can get  special training and directions for giving medicine and taking care of the port at home.  SEEK MEDICAL CARE IF:   Your port does not flush or you are unable to get a blood return.   You have a fever or chills. SEEK IMMEDIATE MEDICAL CARE IF:  You have new fluid or pus coming from your incision.   You notice a bad smell coming from your incision site.   You have swelling, pain, or more redness at the incision or port site.   You have chest pain or shortness of breath.  May remove dressing and shower in 24 to 48 hours.  Keep dressing clean and dry.   This information is not intended to replace advice given to you by your health care provider. Make sure you discuss any questions you have with your health care provider.   Document Released: 03/18/2013 Document Revised: 06/02/2013 Document Reviewed: 03/18/2013 Elsevier Interactive Patient Education [employer].

## 2015-06-20 ENCOUNTER — Other Ambulatory Visit (HOSPITAL_BASED_OUTPATIENT_CLINIC_OR_DEPARTMENT_OTHER): Payer: Medicare Other

## 2015-06-20 ENCOUNTER — Ambulatory Visit (HOSPITAL_BASED_OUTPATIENT_CLINIC_OR_DEPARTMENT_OTHER): Payer: Medicare Other

## 2015-06-20 ENCOUNTER — Ambulatory Visit (HOSPITAL_COMMUNITY)
Admission: RE | Admit: 2015-06-20 | Discharge: 2015-06-20 | Disposition: A | Discharge status: Home / Self Care | Payer: Medicare Other | Source: Ambulatory Visit | Attending: Hematology & Oncology | Admitting: Hematology & Oncology

## 2015-06-20 ENCOUNTER — Other Ambulatory Visit: Payer: Self-pay | Admitting: *Deleted

## 2015-06-20 VITALS — BP 156/61 | HR 92 | Temp 97.6°F | Resp 20 | Wt 228.4 lb

## 2015-06-20 DIAGNOSIS — Z5111 Encounter for antineoplastic chemotherapy: Secondary | ICD-10-CM

## 2015-06-20 DIAGNOSIS — D4620 Refractory anemia with excess of blasts, unspecified: Secondary | ICD-10-CM

## 2015-06-20 DIAGNOSIS — D4621 Refractory anemia with excess of blasts 1: Secondary | ICD-10-CM

## 2015-06-20 LAB — CMP (CANCER CENTER ONLY)
ALBUMIN: 3.1 g/dL — AB (ref 3.3–5.5)
ALK PHOS: 33 U/L (ref 26–84)
ALT(SGPT): 11 U/L (ref 10–47)
AST: 18 U/L (ref 11–38)
BILIRUBIN TOTAL: 0.6 mg/dL (ref 0.20–1.60)
BUN, Bld: 20 mg/dL (ref 7–22)
CALCIUM: 8.2 mg/dL (ref 8.0–10.3)
CO2: 26 meq/L (ref 18–33)
Chloride: 103 mEq/L (ref 98–108)
Creat: 1.5 mg/dl — ABNORMAL HIGH (ref 0.6–1.2)
Glucose, Bld: 118 mg/dL (ref 73–118)
Potassium: 4.1 mEq/L (ref 3.3–4.7)
Sodium: 139 mEq/L (ref 128–145)
TOTAL PROTEIN: 7.6 g/dL (ref 6.4–8.1)

## 2015-06-20 LAB — CBC WITH DIFFERENTIAL (CANCER CENTER ONLY)
BASO#: 0 10*3/uL (ref 0.0–0.2)
BASO%: 0.4 % (ref 0.0–2.0)
EOS ABS: 0 10*3/uL (ref 0.0–0.5)
EOS%: 1.7 % (ref 0.0–7.0)
HCT: 22.9 % — ABNORMAL LOW (ref 38.7–49.9)
HGB: 7.3 g/dL — ABNORMAL LOW (ref 13.0–17.1)
LYMPH#: 0.6 10*3/uL — ABNORMAL LOW (ref 0.9–3.3)
LYMPH%: 27.9 % (ref 14.0–48.0)
MCH: 38.6 pg — AB (ref 28.0–33.4)
MCHC: 31.9 g/dL — ABNORMAL LOW (ref 32.0–35.9)
MCV: 121 fL — AB (ref 82–98)
MONO#: 0.8 10*3/uL (ref 0.1–0.9)
MONO%: 36.2 % — ABNORMAL HIGH (ref 0.0–13.0)
NEUT#: 0.8 10*3/uL — ABNORMAL LOW (ref 1.5–6.5)
NEUT%: 33.8 % — AB (ref 40.0–80.0)
PLATELETS: 127 10*3/uL — AB (ref 145–400)
RBC: 1.89 10*6/uL — ABNORMAL LOW (ref 4.20–5.70)
RDW: 15.6 % (ref 11.1–15.7)
WBC: 2.3 10*3/uL — ABNORMAL LOW (ref 4.0–10.0)

## 2015-06-20 LAB — HOLD TUBE, BLOOD BANK - CHCC SATELLITE

## 2015-06-20 LAB — ABO/RH: ABO/RH(D): O POS

## 2015-06-20 LAB — TECHNOLOGIST REVIEW CHCC SATELLITE

## 2015-06-20 MED ORDER — SODIUM CHLORIDE 0.9 % IV SOLN
Freq: Once | INTRAVENOUS | Status: AC
  Administered 2015-06-20: 14:00:00 via INTRAVENOUS
  Filled 2015-06-20: qty 4

## 2015-06-20 MED ORDER — DEXAMETHASONE 4 MG PO TABS: 8.0000 mg | ORAL_TABLET | Freq: Two times a day (BID) | ORAL | Status: DC

## 2015-06-20 MED ORDER — HEPARIN SOD (PORK) LOCK FLUSH 100 UNIT/ML IV SOLN
500.0000 [IU] | Freq: Once | INTRAVENOUS | Status: AC | PRN
  Administered 2015-06-20: 500 [IU]
  Filled 2015-06-20: qty 5

## 2015-06-20 MED ORDER — LORAZEPAM 0.5 MG PO TABS: 0.5000 mg | ORAL_TABLET | Freq: Four times a day (QID) | ORAL | Status: DC | PRN

## 2015-06-20 MED ORDER — SODIUM CHLORIDE 0.9 % IV SOLN
Freq: Once | INTRAVENOUS | Status: AC
  Administered 2015-06-20: 14:00:00 via INTRAVENOUS

## 2015-06-20 MED ORDER — PROCHLORPERAZINE MALEATE 10 MG PO TABS: 10.0000 mg | ORAL_TABLET | Freq: Four times a day (QID) | ORAL | Status: DC | PRN

## 2015-06-20 MED ORDER — ONDANSETRON HCL 8 MG PO TABS: 8.0000 mg | ORAL_TABLET | Freq: Two times a day (BID) | ORAL | Status: DC

## 2015-06-20 MED ORDER — LACTATED RINGERS IV SOLN
75.0000 mg/m2 | Freq: Every day | INTRAVENOUS | Status: DC
  Administered 2015-06-20: 170 mg via INTRAVENOUS
  Filled 2015-06-20: qty 34

## 2015-06-20 MED ORDER — SODIUM CHLORIDE 0.9 % IJ SOLN
10.0000 mL | INTRAMUSCULAR | Status: DC | PRN
  Administered 2015-06-20: 10 mL
  Filled 2015-06-20: qty 10

## 2015-06-20 MED FILL — PROCHLORPERAZINE 10 MG TAB: 10 | 8 days supply | Qty: 30 | Fill #0

## 2015-06-20 MED FILL — ONDANSETRON HCL 8 MG TABLET: 8 | 15 days supply | Qty: 30 | Fill #0

## 2015-06-20 MED FILL — DEXAMETHASONE 4 MG TABLET: 4 | 8 days supply | Qty: 30 | Fill #0

## 2015-06-20 MED FILL — LORazepam 0.5 MG TABS: 0.5 | 8 days supply | Qty: 30 | Fill #0

## 2015-06-20 NOTE — Patient Instructions (Signed)
[facility] Discharge Instructions for Patients Receiving Chemotherapy  Today you received the following chemotherapy agents Vidaza  To help prevent nausea and vomiting after your treatment, we encourage you to take your nausea medications as directed on the bottle.   If you develop nausea and vomiting that is not controlled by your nausea medication, call the clinic.   BELOW ARE SYMPTOMS THAT SHOULD BE REPORTED IMMEDIATELY:  *FEVER GREATER THAN 100.5 F  *CHILLS WITH OR WITHOUT FEVER  NAUSEA AND VOMITING THAT IS NOT CONTROLLED WITH YOUR NAUSEA MEDICATION  *UNUSUAL SHORTNESS OF BREATH  *UNUSUAL BRUISING OR BLEEDING  TENDERNESS IN MOUTH AND THROAT WITH OR WITHOUT PRESENCE OF ULCERS  *URINARY PROBLEMS  *BOWEL PROBLEMS  UNUSUAL RASH Items with * indicate a potential emergency and should be followed up as soon as possible.  Feel free to call the clinic you have any questions or concerns. The clinic phone number is [phone].  Please show the [facility] at check-in to the Emergency Department and triage nurse. Azacitidine suspension for injection (subcutaneous use) What is this medicine? AZACITIDINE (ay Trinity) is a chemotherapy drug. This medicine reduces the growth of cancer cells and can suppress the immune system. It is used for treating myelodysplastic syndrome or some types of leukemia. This medicine may be used for other purposes; ask your health care provider or pharmacist if you have questions. What should I tell my health care provider before I take this medicine? They need to know if you have any of these conditions: -infection (especially a virus infection such as chickenpox, cold sores, or herpes) -kidney disease -liver disease -liver tumors -an unusual or allergic reaction to azacitidine, mannitol, other medicines, foods, dyes, or preservatives -pregnant or trying to get pregnant -breast-feeding How should I use this  medicine? This medicine is for injection under the skin. It is administered in a hospital or clinic by a specially trained health care professional. Talk to your pediatrician regarding the use of this medicine in children. While this drug may be prescribed for selected conditions, precautions do apply. Overdosage: If you think you have taken too much of this medicine contact a poison control center or emergency room at once. NOTE: This medicine is only for you. Do not share this medicine with others. What if I miss a dose? It is important not to miss your dose. Call your doctor or health care professional if you are unable to keep an appointment. What may interact with this medicine? Interactions have not been studied. Give your health care provider a list of all the medicines, herbs, non-prescription drugs, or dietary supplements you use. Also tell them if you smoke, drink alcohol, or use illegal drugs. Some items may interact with your medicine. This list may not describe all possible interactions. Give your health care provider a list of all the medicines, herbs, non-prescription drugs, or dietary supplements you use. Also tell them if you smoke, drink alcohol, or use illegal drugs. Some items may interact with your medicine. What should I watch for while using this medicine? Visit your doctor for checks on your progress. This drug may make you feel generally unwell. This is not uncommon, as chemotherapy can affect healthy cells as well as cancer cells. Report any side effects. Continue your course of treatment even though you feel ill unless your doctor tells you to stop. In some cases, you may be given additional medicines to help with side effects. Follow all directions for their  use. Call your doctor or health care professional for advice if you get a fever, chills or sore throat, or other symptoms of a cold or flu. Do not treat yourself. This drug decreases your body's ability to fight  infections. Try to avoid being around people who are sick. This medicine may increase your risk to bruise or bleed. Call your doctor or health care professional if you notice any unusual bleeding. Do not have any vaccinations without your doctor's approval and avoid anyone who has recently had oral polio vaccine. Do not become pregnant while taking this medicine. Women should inform their doctor if they wish to become pregnant or think they might be pregnant. There is a potential for serious side effects to an unborn child. Talk to your health care professional or pharmacist for more information. Do not breast-feed an infant while taking this medicine. If you are a man, you should not father a child while receiving treatment. What side effects may I notice from receiving this medicine? Side effects that you should report to your doctor or health care professional as soon as possible: -allergic reactions like skin rash, itching or hives, swelling of the face, lips, or tongue -low blood counts - this medicine may decrease the number of white blood cells, red blood cells and platelets. You may be at increased risk for infections and bleeding. -signs of infection - fever or chills, cough, sore throat, pain or difficulty passing urine -signs of decreased platelets or bleeding - bruising, pinpoint red spots on the skin, black, tarry stools, blood in the urine -signs of decreased red blood cells - unusually weak or tired, fainting spells, lightheadedness -reactions at the injection site including redness, pain, itching, or bruising -breathing problems -changes in vision -fever -mouth sores -stomach pain -vomiting Side effects that usually do not require medical attention (report to your doctor or health care professional if they continue or are bothersome): -constipation -diarrhea -loss of appetite -nausea -pain or redness at the injection site -weak or tired This list may not describe all  possible side effects. Call your doctor for medical advice about side effects. You may report side effects to FDA at 1-800-FDA-1088. Where should I keep my medicine? This drug is given in a hospital or clinic and will not be stored at home. NOTE: This sheet is a summary. It may not cover all possible information. If you have questions about this medicine, talk to your doctor, pharmacist, or health care provider.    2016, Elsevier/Gold Standard. (2014-02-19 18:13:53)

## 2015-06-20 NOTE — Progress Notes (Signed)
Dr. Marin Olp notified of lab results, ok to treat today, will transfuse this week.

## 2015-06-21 ENCOUNTER — Other Ambulatory Visit: Payer: Self-pay | Admitting: Family

## 2015-06-21 ENCOUNTER — Ambulatory Visit (HOSPITAL_BASED_OUTPATIENT_CLINIC_OR_DEPARTMENT_OTHER): Payer: Medicare Other

## 2015-06-21 VITALS — BP 161/77 | HR 91 | Temp 97.6°F | Resp 18

## 2015-06-21 DIAGNOSIS — D4620 Refractory anemia with excess of blasts, unspecified: Secondary | ICD-10-CM

## 2015-06-21 DIAGNOSIS — D4621 Refractory anemia with excess of blasts 1: Secondary | ICD-10-CM

## 2015-06-21 DIAGNOSIS — Z5111 Encounter for antineoplastic chemotherapy: Secondary | ICD-10-CM

## 2015-06-21 LAB — PREPARE RBC (CROSSMATCH)

## 2015-06-21 MED ORDER — ACETAMINOPHEN 325 MG PO TABS
650.0000 mg | ORAL_TABLET | Freq: Once | ORAL | Status: AC
  Administered 2015-06-21: 650 mg via ORAL

## 2015-06-21 MED ORDER — SODIUM CHLORIDE 0.9 % IV SOLN
Freq: Once | INTRAVENOUS | Status: AC
  Administered 2015-06-21: 08:00:00 via INTRAVENOUS

## 2015-06-21 MED ORDER — DEXAMETHASONE SODIUM PHOSPHATE 100 MG/10ML IJ SOLN
10.0000 mg | Freq: Once | INTRAMUSCULAR | Status: AC
  Administered 2015-06-21: 10 mg via INTRAVENOUS
  Filled 2015-06-21: qty 1

## 2015-06-21 MED ORDER — DIPHENHYDRAMINE HCL 25 MG PO CAPS
25.0000 mg | ORAL_CAPSULE | Freq: Once | ORAL | Status: AC
  Administered 2015-06-21: 25 mg via ORAL

## 2015-06-21 MED ORDER — SODIUM CHLORIDE 0.9 % IJ SOLN
10.0000 mL | INTRAMUSCULAR | Status: DC | PRN
  Administered 2015-06-21: 10 mL
  Filled 2015-06-21: qty 10

## 2015-06-21 MED ORDER — AZACITIDINE CHEMO INJECTION 100 MG
75.0000 mg/m2 | Freq: Every day | INTRAMUSCULAR | Status: DC
  Administered 2015-06-21: 170 mg via INTRAVENOUS
  Filled 2015-06-21: qty 34

## 2015-06-21 MED ORDER — SODIUM CHLORIDE 0.9 % IV SOLN
Freq: Once | INTRAVENOUS | Status: AC
  Administered 2015-06-21: 08:00:00 via INTRAVENOUS
  Filled 2015-06-21: qty 4

## 2015-06-21 MED ORDER — HEPARIN SOD (PORK) LOCK FLUSH 100 UNIT/ML IV SOLN
500.0000 [IU] | Freq: Once | INTRAVENOUS | Status: AC | PRN
  Administered 2015-06-21: 500 [IU]
  Filled 2015-06-21: qty 5

## 2015-06-21 MED ORDER — DIPHENHYDRAMINE HCL 25 MG PO CAPS
ORAL_CAPSULE | ORAL | Status: AC
  Filled 2015-06-21: qty 1

## 2015-06-21 MED ORDER — SODIUM CHLORIDE 0.9 % IV SOLN: 250.0000 mL | Freq: Once | INTRAVENOUS | Status: DC

## 2015-06-21 MED ORDER — ACETAMINOPHEN 325 MG PO TABS
ORAL_TABLET | ORAL | Status: AC
  Filled 2015-06-21: qty 2

## 2015-06-21 NOTE — Patient Instructions (Signed)
[facility] Discharge Instructions for Patients Receiving Chemotherapy  Today you received the following chemotherapy agents Vidaza  To help prevent nausea and vomiting after your treatment, we encourage you to take your nausea medications as directed on the bottle.   If you develop nausea and vomiting that is not controlled by your nausea medication, call the clinic.   BELOW ARE SYMPTOMS THAT SHOULD BE REPORTED IMMEDIATELY:  *FEVER GREATER THAN 100.5 F  *CHILLS WITH OR WITHOUT FEVER  NAUSEA AND VOMITING THAT IS NOT CONTROLLED WITH YOUR NAUSEA MEDICATION  *UNUSUAL SHORTNESS OF BREATH  *UNUSUAL BRUISING OR BLEEDING  TENDERNESS IN MOUTH AND THROAT WITH OR WITHOUT PRESENCE OF ULCERS  *URINARY PROBLEMS  *BOWEL PROBLEMS  UNUSUAL RASH Items with * indicate a potential emergency and should be followed up as soon as possible.  Feel free to call the clinic you have any questions or concerns. The clinic phone number is [phone].  Please show the [facility] at check-in to the Emergency Department and triage nurse. Azacitidine suspension for injection (subcutaneous use) What is this medicine? AZACITIDINE (ay [place]) is a chemotherapy drug. This medicine reduces the growth of cancer cells and can suppress the immune system. It is used for treating myelodysplastic syndrome or some types of leukemia. This medicine may be used for other purposes; ask your health care provider or pharmacist if you have questions. What should I tell my health care provider before I take this medicine? They need to know if you have any of these conditions: -infection (especially a virus infection such as chickenpox, cold sores, or herpes) -kidney disease -liver disease -liver tumors -an unusual or allergic reaction to azacitidine, mannitol, other medicines, foods, dyes, or preservatives -pregnant or trying to get pregnant -breast-feeding How should I use this  medicine? This medicine is for injection under the skin. It is administered in a hospital or clinic by a specially trained health care professional. Talk to your pediatrician regarding the use of this medicine in children. While this drug may be prescribed for selected conditions, precautions do apply. Overdosage: If you think you have taken too much of this medicine contact a poison control center or emergency room at once. NOTE: This medicine is only for you. Do not share this medicine with others. What if I miss a dose? It is important not to miss your dose. Call your doctor or health care professional if you are unable to keep an appointment. What may interact with this medicine? Interactions have not been studied. Give your health care provider a list of all the medicines, herbs, non-prescription drugs, or dietary supplements you use. Also tell them if you smoke, drink alcohol, or use illegal drugs. Some items may interact with your medicine. This list may not describe all possible interactions. Give your health care provider a list of all the medicines, herbs, non-prescription drugs, or dietary supplements you use. Also tell them if you smoke, drink alcohol, or use illegal drugs. Some items may interact with your medicine. What should I watch for while using this medicine? Visit your doctor for checks on your progress. This drug may make you feel generally unwell. This is not uncommon, as chemotherapy can affect healthy cells as well as cancer cells. Report any side effects. Continue your course of treatment even though you feel ill unless your doctor tells you to stop. In some cases, you may be given additional medicines to help with side effects. Follow all directions for their  use. Call your doctor or health care professional for advice if you get a fever, chills or sore throat, or other symptoms of a cold or flu. Do not treat yourself. This drug decreases your body's ability to fight  infections. Try to avoid being around people who are sick. This medicine may increase your risk to bruise or bleed. Call your doctor or health care professional if you notice any unusual bleeding. Do not have any vaccinations without your doctor's approval and avoid anyone who has recently had oral polio vaccine. Do not become pregnant while taking this medicine. Women should inform their doctor if they wish to become pregnant or think they might be pregnant. There is a potential for serious side effects to an unborn child. Talk to your health care professional or pharmacist for more information. Do not breast-feed an infant while taking this medicine. If you are a man, you should not father a child while receiving treatment. What side effects may I notice from receiving this medicine? Side effects that you should report to your doctor or health care professional as soon as possible: -allergic reactions like skin rash, itching or hives, swelling of the face, lips, or tongue -low blood counts - this medicine may decrease the number of white blood cells, red blood cells and platelets. You may be at increased risk for infections and bleeding. -signs of infection - fever or chills, cough, sore throat, pain or difficulty passing urine -signs of decreased platelets or bleeding - bruising, pinpoint red spots on the skin, black, tarry stools, blood in the urine -signs of decreased red blood cells - unusually weak or tired, fainting spells, lightheadedness -reactions at the injection site including redness, pain, itching, or bruising -breathing problems -changes in vision -fever -mouth sores -stomach pain -vomiting Side effects that usually do not require medical attention (report to your doctor or health care professional if they continue or are bothersome): -constipation -diarrhea -loss of appetite -nausea -pain or redness at the injection site -weak or tired This list may not describe all  possible side effects. Call your doctor for medical advice about side effects. You may report side effects to FDA at 1-800-FDA-1088. Where should I keep my medicine? This drug is given in a hospital or clinic and will not be stored at home. NOTE: This sheet is a summary. It may not cover all possible information. If you have questions about this medicine, talk to your doctor, pharmacist, or health care provider.    2016, Elsevier/Gold Standard. (2014-02-19 18:13:53)   Blood Transfusion  A blood transfusion is a procedure in which you receive donated blood through an IV tube. You may need a blood transfusion because of illness, surgery, or injury. The blood may come from a donor, or it may be your own blood that you donated previously. The blood given in a transfusion is made up of different types of cells. You may receive:  Red blood cells. These carry oxygen and replace lost blood.  Platelets. These control bleeding.  Plasma. Thishelps blood to clot. If you have hemophilia or another clotting disorder, you may also receive other types of blood products. LET [facility] CARE PROVIDER KNOW ABOUT:  Any allergies you have.  All medicines you are taking, including vitamins, herbs, eye drops, creams, and over-the-counter medicines.  Previous problems you or members of your family have had with the use of anesthetics.  Any blood disorders you have.  Previous surgeries you have had.  Any medical conditions you may  have.  Any previous reactions you have had during a blood transfusion.  RISKS AND COMPLICATIONS Generally, this is a safe procedure. However, problems may occur, including:  Having an allergic reaction to something in the donated blood.  Fever. This may be a reaction to the white blood cells in the transfused blood.  Iron overload. This can happen from having many transfusions.  Transfusion-related acute lung injury (TRALI). This is a rare reaction that causes lung  damage. The cause is not known.TRALI can occur within hours of a transfusion or several days later.  Sudden (acute) or delayed hemolytic reactions. This happens if your blood does not match the cells in your transfusion. Your body's defense system (immune system) may try to attack the new cells. This complication is rare.  Infection. This is rare. BEFORE THE PROCEDURE  You may have a blood test to determine your blood type. This is necessary to know what kind of blood your body will accept.  If you are going to have a planned surgery, you may donate your own blood. This may be done in case you need to have a transfusion.  If you have had an allergic reaction to a transfusion in the past, you may be given medicine to help prevent a reaction. Take this medicine only as directed by your health care provider.  You will have your temperature, blood pressure, and pulse monitored before the transfusion. PROCEDURE   An IV will be started in your hand or arm.  The bag of donated blood will be attached to your IV tube and given into your vein.  Your temperature, blood pressure, and pulse will be monitored regularly during the transfusion. This monitoring is done to detect early signs of a transfusion reaction.  If you have any signs or symptoms of a reaction, your transfusion will be stopped and you may be given medicine.  When the transfusion is over, your IV will be removed.  Pressure may be applied to the IV site for a few minutes.  A bandage (dressing) will be applied. The procedure may vary among health care providers and hospitals. AFTER THE PROCEDURE  Your blood pressure, temperature, and pulse will be monitored regularly.   This information is not intended to replace advice given to you by your health care provider. Make sure you discuss any questions you have with your health care provider.   Document Released: 05/25/2000 Document Revised: 06/18/2014 Document Reviewed:  04/07/2014 Elsevier Interactive Patient Education [employer].

## 2015-06-22 ENCOUNTER — Ambulatory Visit (HOSPITAL_BASED_OUTPATIENT_CLINIC_OR_DEPARTMENT_OTHER): Payer: Medicare Other

## 2015-06-22 VITALS — BP 155/65 | HR 93 | Temp 98.0°F | Resp 20

## 2015-06-22 DIAGNOSIS — D4620 Refractory anemia with excess of blasts, unspecified: Secondary | ICD-10-CM

## 2015-06-22 DIAGNOSIS — D4621 Refractory anemia with excess of blasts 1: Secondary | ICD-10-CM | POA: Diagnosis not present

## 2015-06-22 DIAGNOSIS — Z5111 Encounter for antineoplastic chemotherapy: Secondary | ICD-10-CM | POA: Diagnosis not present

## 2015-06-22 LAB — TYPE AND SCREEN
ABO/RH(D): O POS
Antibody Screen: NEGATIVE
UNIT DIVISION: 0
Unit division: 0

## 2015-06-22 MED ORDER — LACTATED RINGERS IV SOLN
75.0000 mg/m2 | Freq: Every day | INTRAVENOUS | Status: DC
  Administered 2015-06-22: 170 mg via INTRAVENOUS
  Filled 2015-06-22: qty 34

## 2015-06-22 MED ORDER — SODIUM CHLORIDE 0.9 % IV SOLN
Freq: Once | INTRAVENOUS | Status: AC
  Administered 2015-06-22: 14:00:00 via INTRAVENOUS

## 2015-06-22 MED ORDER — SODIUM CHLORIDE 0.9 % IJ SOLN
10.0000 mL | INTRAMUSCULAR | Status: DC | PRN
  Administered 2015-06-22: 10 mL
  Filled 2015-06-22: qty 10

## 2015-06-22 MED ORDER — DEXAMETHASONE SODIUM PHOSPHATE 100 MG/10ML IJ SOLN
Freq: Once | INTRAMUSCULAR | Status: AC
  Administered 2015-06-22: 14:00:00 via INTRAVENOUS
  Filled 2015-06-22: qty 4

## 2015-06-22 MED ORDER — HEPARIN SOD (PORK) LOCK FLUSH 100 UNIT/ML IV SOLN
500.0000 [IU] | Freq: Once | INTRAVENOUS | Status: AC | PRN
  Administered 2015-06-22: 500 [IU]
  Filled 2015-06-22: qty 5

## 2015-06-22 NOTE — Progress Notes (Signed)
3:50 PM Pt nstructed to remind nurses to use OpSite or Sorbaview dsg due itching around port site. Verbalized understanding.

## 2015-06-22 NOTE — Patient Instructions (Signed)
[facility] Discharge Instructions for Patients Receiving Chemotherapy  Today you received the following chemotherapy agents Vidaza  To help prevent nausea and vomiting after your treatment, we encourage you to take your nausea medications as directed on the bottle.   If you develop nausea and vomiting that is not controlled by your nausea medication, call the clinic.   BELOW ARE SYMPTOMS THAT SHOULD BE REPORTED IMMEDIATELY:  *FEVER GREATER THAN 100.5 F  *CHILLS WITH OR WITHOUT FEVER  NAUSEA AND VOMITING THAT IS NOT CONTROLLED WITH YOUR NAUSEA MEDICATION  *UNUSUAL SHORTNESS OF BREATH  *UNUSUAL BRUISING OR BLEEDING  TENDERNESS IN MOUTH AND THROAT WITH OR WITHOUT PRESENCE OF ULCERS  *URINARY PROBLEMS  *BOWEL PROBLEMS  UNUSUAL RASH Items with * indicate a potential emergency and should be followed up as soon as possible.  Feel free to call the clinic you have any questions or concerns. The clinic phone number is [phone].  Please show the [facility] at check-in to the Emergency Department and triage nurse. Azacitidine suspension for injection (subcutaneous use) What is this medicine? AZACITIDINE (ay [place]) is a chemotherapy drug. This medicine reduces the growth of cancer cells and can suppress the immune system. It is used for treating myelodysplastic syndrome or some types of leukemia. This medicine may be used for other purposes; ask your health care provider or pharmacist if you have questions. What should I tell my health care provider before I take this medicine? They need to know if you have any of these conditions: -infection (especially a virus infection such as chickenpox, cold sores, or herpes) -kidney disease -liver disease -liver tumors -an unusual or allergic reaction to azacitidine, mannitol, other medicines, foods, dyes, or preservatives -pregnant or trying to get pregnant -breast-feeding How should I use this  medicine? This medicine is for injection under the skin. It is administered in a hospital or clinic by a specially trained health care professional. Talk to your pediatrician regarding the use of this medicine in children. While this drug may be prescribed for selected conditions, precautions do apply. Overdosage: If you think you have taken too much of this medicine contact a poison control center or emergency room at once. NOTE: This medicine is only for you. Do not share this medicine with others. What if I miss a dose? It is important not to miss your dose. Call your doctor or health care professional if you are unable to keep an appointment. What may interact with this medicine? Interactions have not been studied. Give your health care provider a list of all the medicines, herbs, non-prescription drugs, or dietary supplements you use. Also tell them if you smoke, drink alcohol, or use illegal drugs. Some items may interact with your medicine. This list may not describe all possible interactions. Give your health care provider a list of all the medicines, herbs, non-prescription drugs, or dietary supplements you use. Also tell them if you smoke, drink alcohol, or use illegal drugs. Some items may interact with your medicine. What should I watch for while using this medicine? Visit your doctor for checks on your progress. This drug may make you feel generally unwell. This is not uncommon, as chemotherapy can affect healthy cells as well as cancer cells. Report any side effects. Continue your course of treatment even though you feel ill unless your doctor tells you to stop. In some cases, you may be given additional medicines to help with side effects. Follow all directions for their  use. Call your doctor or health care professional for advice if you get a fever, chills or sore throat, or other symptoms of a cold or flu. Do not treat yourself. This drug decreases your body's ability to fight  infections. Try to avoid being around people who are sick. This medicine may increase your risk to bruise or bleed. Call your doctor or health care professional if you notice any unusual bleeding. Do not have any vaccinations without your doctor's approval and avoid anyone who has recently had oral polio vaccine. Do not become pregnant while taking this medicine. Women should inform their doctor if they wish to become pregnant or think they might be pregnant. There is a potential for serious side effects to an unborn child. Talk to your health care professional or pharmacist for more information. Do not breast-feed an infant while taking this medicine. If you are a man, you should not father a child while receiving treatment. What side effects may I notice from receiving this medicine? Side effects that you should report to your doctor or health care professional as soon as possible: -allergic reactions like skin rash, itching or hives, swelling of the face, lips, or tongue -low blood counts - this medicine may decrease the number of white blood cells, red blood cells and platelets. You may be at increased risk for infections and bleeding. -signs of infection - fever or chills, cough, sore throat, pain or difficulty passing urine -signs of decreased platelets or bleeding - bruising, pinpoint red spots on the skin, black, tarry stools, blood in the urine -signs of decreased red blood cells - unusually weak or tired, fainting spells, lightheadedness -reactions at the injection site including redness, pain, itching, or bruising -breathing problems -changes in vision -fever -mouth sores -stomach pain -vomiting Side effects that usually do not require medical attention (report to your doctor or health care professional if they continue or are bothersome): -constipation -diarrhea -loss of appetite -nausea -pain or redness at the injection site -weak or tired This list may not describe all  possible side effects. Call your doctor for medical advice about side effects. You may report side effects to FDA at 1-800-FDA-1088. Where should I keep my medicine? This drug is given in a hospital or clinic and will not be stored at home. NOTE: This sheet is a summary. It may not cover all possible information. If you have questions about this medicine, talk to your doctor, pharmacist, or health care provider.    2016, Elsevier/Gold Standard. (2014-02-19 18:13:53)   Blood Transfusion  A blood transfusion is a procedure in which you receive donated blood through an IV tube. You may need a blood transfusion because of illness, surgery, or injury. The blood may come from a donor, or it may be your own blood that you donated previously. The blood given in a transfusion is made up of different types of cells. You may receive:  Red blood cells. These carry oxygen and replace lost blood.  Platelets. These control bleeding.  Plasma. Thishelps blood to clot. If you have hemophilia or another clotting disorder, you may also receive other types of blood products. LET [facility] CARE PROVIDER KNOW ABOUT:  Any allergies you have.  All medicines you are taking, including vitamins, herbs, eye drops, creams, and over-the-counter medicines.  Previous problems you or members of your family have had with the use of anesthetics.  Any blood disorders you have.  Previous surgeries you have had.  Any medical conditions you may  have.  Any previous reactions you have had during a blood transfusion.  RISKS AND COMPLICATIONS Generally, this is a safe procedure. However, problems may occur, including:  Having an allergic reaction to something in the donated blood.  Fever. This may be a reaction to the white blood cells in the transfused blood.  Iron overload. This can happen from having many transfusions.  Transfusion-related acute lung injury (TRALI). This is a rare reaction that causes lung  damage. The cause is not known.TRALI can occur within hours of a transfusion or several days later.  Sudden (acute) or delayed hemolytic reactions. This happens if your blood does not match the cells in your transfusion. Your body's defense system (immune system) may try to attack the new cells. This complication is rare.  Infection. This is rare. BEFORE THE PROCEDURE  You may have a blood test to determine your blood type. This is necessary to know what kind of blood your body will accept.  If you are going to have a planned surgery, you may donate your own blood. This may be done in case you need to have a transfusion.  If you have had an allergic reaction to a transfusion in the past, you may be given medicine to help prevent a reaction. Take this medicine only as directed by your health care provider.  You will have your temperature, blood pressure, and pulse monitored before the transfusion. PROCEDURE   An IV will be started in your hand or arm.  The bag of donated blood will be attached to your IV tube and given into your vein.  Your temperature, blood pressure, and pulse will be monitored regularly during the transfusion. This monitoring is done to detect early signs of a transfusion reaction.  If you have any signs or symptoms of a reaction, your transfusion will be stopped and you may be given medicine.  When the transfusion is over, your IV will be removed.  Pressure may be applied to the IV site for a few minutes.  A bandage (dressing) will be applied. The procedure may vary among health care providers and hospitals. AFTER THE PROCEDURE  Your blood pressure, temperature, and pulse will be monitored regularly.   This information is not intended to replace advice given to you by your health care provider. Make sure you discuss any questions you have with your health care provider.   Document Released: 05/25/2000 Document Revised: 06/18/2014 Document Reviewed:  04/07/2014 Elsevier Interactive Patient Education [employer].

## 2015-06-23 ENCOUNTER — Ambulatory Visit (HOSPITAL_BASED_OUTPATIENT_CLINIC_OR_DEPARTMENT_OTHER): Payer: Medicare Other

## 2015-06-23 VITALS — BP 139/66 | HR 84 | Temp 97.8°F | Resp 18

## 2015-06-23 DIAGNOSIS — D4620 Refractory anemia with excess of blasts, unspecified: Secondary | ICD-10-CM

## 2015-06-23 DIAGNOSIS — Z5111 Encounter for antineoplastic chemotherapy: Secondary | ICD-10-CM

## 2015-06-23 DIAGNOSIS — D4621 Refractory anemia with excess of blasts 1: Secondary | ICD-10-CM | POA: Diagnosis not present

## 2015-06-23 MED ORDER — SODIUM CHLORIDE 0.9 % IV SOLN
Freq: Once | INTRAVENOUS | Status: AC
  Administered 2015-06-23: 14:00:00 via INTRAVENOUS

## 2015-06-23 MED ORDER — HEPARIN SOD (PORK) LOCK FLUSH 100 UNIT/ML IV SOLN
500.0000 [IU] | Freq: Once | INTRAVENOUS | Status: AC | PRN
  Administered 2015-06-23: 500 [IU]
  Filled 2015-06-23: qty 5

## 2015-06-23 MED ORDER — SODIUM CHLORIDE 0.9 % IJ SOLN
10.0000 mL | INTRAMUSCULAR | Status: DC | PRN
  Administered 2015-06-23: 10 mL
  Filled 2015-06-23: qty 10

## 2015-06-23 MED ORDER — SODIUM CHLORIDE 0.9 % IV SOLN
Freq: Once | INTRAVENOUS | Status: AC
  Administered 2015-06-23: 14:00:00 via INTRAVENOUS
  Filled 2015-06-23: qty 4

## 2015-06-23 MED ORDER — LACTATED RINGERS IV SOLN
75.0000 mg/m2 | Freq: Every day | INTRAVENOUS | Status: DC
  Administered 2015-06-23: 170 mg via INTRAVENOUS
  Filled 2015-06-23: qty 34

## 2015-06-23 NOTE — Patient Instructions (Signed)
[facility] Discharge Instructions for Patients Receiving Chemotherapy  Today you received the following chemotherapy agents Vidaza  To help prevent nausea and vomiting after your treatment, we encourage you to take your nausea medications as directed on the bottle.   If you develop nausea and vomiting that is not controlled by your nausea medication, call the clinic.   BELOW ARE SYMPTOMS THAT SHOULD BE REPORTED IMMEDIATELY:  *FEVER GREATER THAN 100.5 F  *CHILLS WITH OR WITHOUT FEVER  NAUSEA AND VOMITING THAT IS NOT CONTROLLED WITH YOUR NAUSEA MEDICATION  *UNUSUAL SHORTNESS OF BREATH  *UNUSUAL BRUISING OR BLEEDING  TENDERNESS IN MOUTH AND THROAT WITH OR WITHOUT PRESENCE OF ULCERS  *URINARY PROBLEMS  *BOWEL PROBLEMS  UNUSUAL RASH Items with * indicate a potential emergency and should be followed up as soon as possible.  Feel free to call the clinic you have any questions or concerns. The clinic phone number is [phone].  Please show the [facility] at check-in to the Emergency Department and triage nurse. Azacitidine suspension for injection (subcutaneous use) What is this medicine? AZACITIDINE (ay [place]) is a chemotherapy drug. This medicine reduces the growth of cancer cells and can suppress the immune system. It is used for treating myelodysplastic syndrome or some types of leukemia. This medicine may be used for other purposes; ask your health care provider or pharmacist if you have questions. What should I tell my health care provider before I take this medicine? They need to know if you have any of these conditions: -infection (especially a virus infection such as chickenpox, cold sores, or herpes) -kidney disease -liver disease -liver tumors -an unusual or allergic reaction to azacitidine, mannitol, other medicines, foods, dyes, or preservatives -pregnant or trying to get pregnant -breast-feeding How should I use this  medicine? This medicine is for injection under the skin. It is administered in a hospital or clinic by a specially trained health care professional. Talk to your pediatrician regarding the use of this medicine in children. While this drug may be prescribed for selected conditions, precautions do apply. Overdosage: If you think you have taken too much of this medicine contact a poison control center or emergency room at once. NOTE: This medicine is only for you. Do not share this medicine with others. What if I miss a dose? It is important not to miss your dose. Call your doctor or health care professional if you are unable to keep an appointment. What may interact with this medicine? Interactions have not been studied. Give your health care provider a list of all the medicines, herbs, non-prescription drugs, or dietary supplements you use. Also tell them if you smoke, drink alcohol, or use illegal drugs. Some items may interact with your medicine. This list may not describe all possible interactions. Give your health care provider a list of all the medicines, herbs, non-prescription drugs, or dietary supplements you use. Also tell them if you smoke, drink alcohol, or use illegal drugs. Some items may interact with your medicine. What should I watch for while using this medicine? Visit your doctor for checks on your progress. This drug may make you feel generally unwell. This is not uncommon, as chemotherapy can affect healthy cells as well as cancer cells. Report any side effects. Continue your course of treatment even though you feel ill unless your doctor tells you to stop. In some cases, you may be given additional medicines to help with side effects. Follow all directions for their  use. Call your doctor or health care professional for advice if you get a fever, chills or sore throat, or other symptoms of a cold or flu. Do not treat yourself. This drug decreases your body's ability to fight  infections. Try to avoid being around people who are sick. This medicine may increase your risk to bruise or bleed. Call your doctor or health care professional if you notice any unusual bleeding. Do not have any vaccinations without your doctor's approval and avoid anyone who has recently had oral polio vaccine. Do not become pregnant while taking this medicine. Women should inform their doctor if they wish to become pregnant or think they might be pregnant. There is a potential for serious side effects to an unborn child. Talk to your health care professional or pharmacist for more information. Do not breast-feed an infant while taking this medicine. If you are a man, you should not father a child while receiving treatment. What side effects may I notice from receiving this medicine? Side effects that you should report to your doctor or health care professional as soon as possible: -allergic reactions like skin rash, itching or hives, swelling of the face, lips, or tongue -low blood counts - this medicine may decrease the number of white blood cells, red blood cells and platelets. You may be at increased risk for infections and bleeding. -signs of infection - fever or chills, cough, sore throat, pain or difficulty passing urine -signs of decreased platelets or bleeding - bruising, pinpoint red spots on the skin, black, tarry stools, blood in the urine -signs of decreased red blood cells - unusually weak or tired, fainting spells, lightheadedness -reactions at the injection site including redness, pain, itching, or bruising -breathing problems -changes in vision -fever -mouth sores -stomach pain -vomiting Side effects that usually do not require medical attention (report to your doctor or health care professional if they continue or are bothersome): -constipation -diarrhea -loss of appetite -nausea -pain or redness at the injection site -weak or tired This list may not describe all  possible side effects. Call your doctor for medical advice about side effects. You may report side effects to FDA at 1-800-FDA-1088. Where should I keep my medicine? This drug is given in a hospital or clinic and will not be stored at home. NOTE: This sheet is a summary. It may not cover all possible information. If you have questions about this medicine, talk to your doctor, pharmacist, or health care provider.    2016, [employer]. (2014-02-19 18:13:53)

## 2015-06-24 ENCOUNTER — Ambulatory Visit (HOSPITAL_BASED_OUTPATIENT_CLINIC_OR_DEPARTMENT_OTHER): Payer: Medicare Other

## 2015-06-24 DIAGNOSIS — Z5111 Encounter for antineoplastic chemotherapy: Secondary | ICD-10-CM

## 2015-06-24 DIAGNOSIS — D4620 Refractory anemia with excess of blasts, unspecified: Secondary | ICD-10-CM

## 2015-06-24 DIAGNOSIS — D4621 Refractory anemia with excess of blasts 1: Secondary | ICD-10-CM

## 2015-06-24 MED ORDER — SODIUM CHLORIDE 0.9 % IV SOLN
Freq: Once | INTRAVENOUS | Status: AC
  Administered 2015-06-24: 14:00:00 via INTRAVENOUS
  Filled 2015-06-24: qty 4

## 2015-06-24 MED ORDER — SODIUM CHLORIDE 0.9 % IV SOLN
Freq: Once | INTRAVENOUS | Status: AC
  Administered 2015-06-24: 14:00:00 via INTRAVENOUS

## 2015-06-24 MED ORDER — LACTATED RINGERS IV SOLN
75.0000 mg/m2 | Freq: Every day | INTRAVENOUS | Status: DC
  Administered 2015-06-24: 170 mg via INTRAVENOUS
  Filled 2015-06-24: qty 34

## 2015-06-24 MED ORDER — SODIUM CHLORIDE 0.9 % IJ SOLN
10.0000 mL | INTRAMUSCULAR | Status: DC | PRN
  Administered 2015-06-24: 10 mL
  Filled 2015-06-24: qty 10

## 2015-06-24 MED ORDER — HEPARIN SOD (PORK) LOCK FLUSH 100 UNIT/ML IV SOLN
500.0000 [IU] | Freq: Once | INTRAVENOUS | Status: AC | PRN
  Administered 2015-06-24: 500 [IU]
  Filled 2015-06-24: qty 5

## 2015-06-24 NOTE — Patient Instructions (Signed)
[facility] Discharge Instructions for Patients Receiving Chemotherapy  Today you received the following chemotherapy agents Vidaza  To help prevent nausea and vomiting after your treatment, we encourage you to take your nausea medications as directed on the bottle.   If you develop nausea and vomiting that is not controlled by your nausea medication, call the clinic.   BELOW ARE SYMPTOMS THAT SHOULD BE REPORTED IMMEDIATELY:  *FEVER GREATER THAN 100.5 F  *CHILLS WITH OR WITHOUT FEVER  NAUSEA AND VOMITING THAT IS NOT CONTROLLED WITH YOUR NAUSEA MEDICATION  *UNUSUAL SHORTNESS OF BREATH  *UNUSUAL BRUISING OR BLEEDING  TENDERNESS IN MOUTH AND THROAT WITH OR WITHOUT PRESENCE OF ULCERS  *URINARY PROBLEMS  *BOWEL PROBLEMS  UNUSUAL RASH Items with * indicate a potential emergency and should be followed up as soon as possible.  Feel free to call the clinic you have any questions or concerns. The clinic phone number is [phone].  Please show the [facility] at check-in to the Emergency Department and triage nurse. Azacitidine suspension for injection (subcutaneous use) What is this medicine? AZACITIDINE (ay [place]) is a chemotherapy drug. This medicine reduces the growth of cancer cells and can suppress the immune system. It is used for treating myelodysplastic syndrome or some types of leukemia. This medicine may be used for other purposes; ask your health care provider or pharmacist if you have questions. What should I tell my health care provider before I take this medicine? They need to know if you have any of these conditions: -infection (especially a virus infection such as chickenpox, cold sores, or herpes) -kidney disease -liver disease -liver tumors -an unusual or allergic reaction to azacitidine, mannitol, other medicines, foods, dyes, or preservatives -pregnant or trying to get pregnant -breast-feeding How should I use this  medicine? This medicine is for injection under the skin. It is administered in a hospital or clinic by a specially trained health care professional. Talk to your pediatrician regarding the use of this medicine in children. While this drug may be prescribed for selected conditions, precautions do apply. Overdosage: If you think you have taken too much of this medicine contact a poison control center or emergency room at once. NOTE: This medicine is only for you. Do not share this medicine with others. What if I miss a dose? It is important not to miss your dose. Call your doctor or health care professional if you are unable to keep an appointment. What may interact with this medicine? Interactions have not been studied. Give your health care provider a list of all the medicines, herbs, non-prescription drugs, or dietary supplements you use. Also tell them if you smoke, drink alcohol, or use illegal drugs. Some items may interact with your medicine. This list may not describe all possible interactions. Give your health care provider a list of all the medicines, herbs, non-prescription drugs, or dietary supplements you use. Also tell them if you smoke, drink alcohol, or use illegal drugs. Some items may interact with your medicine. What should I watch for while using this medicine? Visit your doctor for checks on your progress. This drug may make you feel generally unwell. This is not uncommon, as chemotherapy can affect healthy cells as well as cancer cells. Report any side effects. Continue your course of treatment even though you feel ill unless your doctor tells you to stop. In some cases, you may be given additional medicines to help with side effects. Follow all directions for their  use. Call your doctor or health care professional for advice if you get a fever, chills or sore throat, or other symptoms of a cold or flu. Do not treat yourself. This drug decreases your body's ability to fight  infections. Try to avoid being around people who are sick. This medicine may increase your risk to bruise or bleed. Call your doctor or health care professional if you notice any unusual bleeding. Do not have any vaccinations without your doctor's approval and avoid anyone who has recently had oral polio vaccine. Do not become pregnant while taking this medicine. Women should inform their doctor if they wish to become pregnant or think they might be pregnant. There is a potential for serious side effects to an unborn child. Talk to your health care professional or pharmacist for more information. Do not breast-feed an infant while taking this medicine. If you are a man, you should not father a child while receiving treatment. What side effects may I notice from receiving this medicine? Side effects that you should report to your doctor or health care professional as soon as possible: -allergic reactions like skin rash, itching or hives, swelling of the face, lips, or tongue -low blood counts - this medicine may decrease the number of white blood cells, red blood cells and platelets. You may be at increased risk for infections and bleeding. -signs of infection - fever or chills, cough, sore throat, pain or difficulty passing urine -signs of decreased platelets or bleeding - bruising, pinpoint red spots on the skin, black, tarry stools, blood in the urine -signs of decreased red blood cells - unusually weak or tired, fainting spells, lightheadedness -reactions at the injection site including redness, pain, itching, or bruising -breathing problems -changes in vision -fever -mouth sores -stomach pain -vomiting Side effects that usually do not require medical attention (report to your doctor or health care professional if they continue or are bothersome): -constipation -diarrhea -loss of appetite -nausea -pain or redness at the injection site -weak or tired This list may not describe all  possible side effects. Call your doctor for medical advice about side effects. You may report side effects to FDA at 1-800-FDA-1088. Where should I keep my medicine? This drug is given in a hospital or clinic and will not be stored at home. NOTE: This sheet is a summary. It may not cover all possible information. If you have questions about this medicine, talk to your doctor, pharmacist, or health care provider.    2016, [employer] Standard. (2014-02-19 18:13:53)

## 2015-06-27 ENCOUNTER — Telehealth: Payer: Self-pay

## 2015-06-27 NOTE — Telephone Encounter (Signed)
Try to call the patient for chemo follow up and check on the patient on how he is doing but unable to reach the patient.

## 2015-07-06 ENCOUNTER — Encounter: Payer: Self-pay | Admitting: Hematology & Oncology

## 2015-07-06 ENCOUNTER — Ambulatory Visit (HOSPITAL_BASED_OUTPATIENT_CLINIC_OR_DEPARTMENT_OTHER): Payer: Medicare Other | Admitting: Hematology & Oncology

## 2015-07-06 ENCOUNTER — Encounter (HOSPITAL_BASED_OUTPATIENT_CLINIC_OR_DEPARTMENT_OTHER): Payer: Medicare Other | Admitting: Hematology & Oncology

## 2015-07-06 VITALS — BP 128/60 | HR 76 | Temp 97.5°F | Resp 18 | Ht 72.0 in | Wt 213.0 lb

## 2015-07-06 DIAGNOSIS — D4620 Refractory anemia with excess of blasts, unspecified: Secondary | ICD-10-CM | POA: Diagnosis not present

## 2015-07-06 DIAGNOSIS — D4621 Refractory anemia with excess of blasts 1: Secondary | ICD-10-CM | POA: Diagnosis not present

## 2015-07-06 LAB — CBC WITH DIFFERENTIAL (CANCER CENTER ONLY)
BASO#: 0 10*3/uL (ref 0.0–0.2)
BASO%: 1.1 % (ref 0.0–2.0)
EOS ABS: 0 10*3/uL (ref 0.0–0.5)
EOS%: 0.7 % (ref 0.0–7.0)
HEMATOCRIT: 28.1 % — AB (ref 38.7–49.9)
HEMOGLOBIN: 9.2 g/dL — AB (ref 13.0–17.1)
LYMPH#: 0.7 10*3/uL — AB (ref 0.9–3.3)
LYMPH%: 25.1 % (ref 14.0–48.0)
MCH: 35.5 pg — AB (ref 28.0–33.4)
MCHC: 32.7 g/dL (ref 32.0–35.9)
MCV: 109 fL — AB (ref 82–98)
MONO#: 0.9 10*3/uL (ref 0.1–0.9)
MONO%: 31.8 % — ABNORMAL HIGH (ref 0.0–13.0)
NEUT%: 41.3 % (ref 40.0–80.0)
NEUTROS ABS: 1.1 10*3/uL — AB (ref 1.5–6.5)
Platelets: 61 10*3/uL — ABNORMAL LOW (ref 145–400)
RBC: 2.59 10*6/uL — AB (ref 4.20–5.70)
RDW: 18.9 % — ABNORMAL HIGH (ref 11.1–15.7)
WBC: 2.7 10*3/uL — AB (ref 4.0–10.0)

## 2015-07-06 LAB — COMPREHENSIVE METABOLIC PANEL
ALBUMIN: 3.4 g/dL — AB (ref 3.5–5.0)
ALK PHOS: 50 U/L (ref 40–150)
ALT: 13 U/L (ref 0–55)
AST: 18 U/L (ref 5–34)
Anion Gap: 8 mEq/L (ref 3–11)
BILIRUBIN TOTAL: 0.35 mg/dL (ref 0.20–1.20)
BUN: 29.4 mg/dL — ABNORMAL HIGH (ref 7.0–26.0)
CALCIUM: 9.2 mg/dL (ref 8.4–10.4)
CO2: 24 mEq/L (ref 22–29)
Chloride: 106 mEq/L (ref 98–109)
Creatinine: 1.7 mg/dL — ABNORMAL HIGH (ref 0.7–1.3)
EGFR: 38 mL/min/{1.73_m2} — AB (ref >90)
GLUCOSE: 94 mg/dL (ref 70–140)
POTASSIUM: 4.2 meq/L (ref 3.5–5.1)
Sodium: 138 mEq/L (ref 136–145)
TOTAL PROTEIN: 7.7 g/dL (ref 6.4–8.3)

## 2015-07-07 NOTE — Progress Notes (Signed)
Hematology and Oncology Follow Up Visit  Anthony [name] [id number] 02-22-1941 75 y.o. 07/07/2015   Principle Diagnosis:   Refractory anemia with excess blasts (RAEB-1)  Current Therapy:    Vidaza 75 mg/m d1-5 - s/p c#1  Aranesp 400g subcutaneous every month for hemoglobin less than 10     Interim History:  Anthony [name] is back for follow-up. We did have to do a bone marrow biopsy on him. This was done on November 29. The pathology report ([id number]) showed refractory anemia with excess blasts. There were 9% blasts. As such, he is categorized as RAEB-1.    We do not have his cytogenetics back yet.   We did do a MDS panel on him. 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    He does feel little bit fatigued. I did this might be from the treatments. He has had no fever. He has had no bleeding. He says he does get a little bit dizzy.  His had a little nausea but no vomiting.  He's had no rashes  He has had no diarrhea.   Overall, his performance status is ECOG 1.    Medications:  Current outpatient prescriptions:  .  Albuterol Sulfate (PROAIR RESPICLICK) 341 (90 BASE) MCG/ACT AEPB, Inhale 2 puffs into the lungs every 6 (six) hours as needed., Disp: , Rfl:  .  aspirin 81 MG tablet, Take 81 mg by mouth daily., Disp: , Rfl:  .  bimatoprost (LUMIGAN) 0.01 % SOLN, Place 1 drop into both eyes at bedtime., Disp: , Rfl:  .  budesonide-formoterol (SYMBICORT) 160-4.5 MCG/ACT inhaler, Inhale 2 puffs into the lungs 2 (two) times daily., Disp: 3 Inhaler, Rfl: 3 .  buPROPion (WELLBUTRIN XL) 150 MG 24 hr tablet, Take 150 mg by mouth daily., Disp: , Rfl:  .  Cholecalciferol (VITAMIN D3) 5000 UNITS TABS, Take by mouth daily., Disp: , Rfl:  .  clopidogrel (PLAVIX) 75 MG tablet, Take 75 mg by mouth daily. , Disp: , Rfl:  .  co-enzyme Q-10 30 MG capsule, Take 100 mg by mouth daily., Disp: , Rfl:  .  Cyanocobalamin (VITAMIN B-12) 2500 MCG TABS, Take 5,000 mcg by mouth daily., Disp:  90 tablet, Rfl: 9 .  dexamethasone (DECADRON) 4 MG tablet, Take 2 tablets (8 mg total) by mouth 2 (two) times daily with a meal. Start the day after chemotherapy for 2 days. Take with food., Disp: 30 tablet, Rfl: 1 .  fish oil-omega-3 fatty acids 1000 MG capsule, Take 1 g by mouth daily., Disp: , Rfl:  .  Flaxseed, Linseed, (FLAX SEED OIL) 1000 MG CAPS, Take 1,000 mg by mouth daily., Disp: , Rfl:  .  lidocaine-prilocaine (EMLA) cream, Apply one teaspoon to port-a-cath 1-2 hours prior to access., Disp: 30 g, Rfl: 3 .  LORazepam (ATIVAN) 0.5 MG tablet, Take 1 tablet (0.5 mg total) by mouth every 6 (six) hours as needed (Nausea or vomiting)., Disp: 30 tablet, Rfl: 0 .  losartan (COZAAR) 50 MG tablet, 25 mg daily., Disp: , Rfl:  .  ondansetron (ZOFRAN) 8 MG tablet, Take 1 tablet (8 mg total) by mouth 2 (two) times daily. Start the day after chemo for 2 days. Then take as needed for nausea or vomiting., Disp: 30 tablet, Rfl: 1 .  pantoprazole (PROTONIX) 40 MG tablet, Take 40 mg by mouth daily., Disp: , Rfl:  .  prochlorperazine (COMPAZINE) 10 MG tablet, Take 1 tablet (10 mg total) by mouth every 6 (six) hours as needed (  Nausea or vomiting)., Disp: 30 tablet, Rfl: 1 .  ranitidine (ZANTAC) 300 MG tablet, Take 300 mg by mouth at bedtime. , Disp: , Rfl:  .  sertraline (ZOLOFT) 100 MG tablet, Take 100 mg by mouth daily., Disp: , Rfl: 1 .  SIMBRINZA 1-0.2 % SUSP, , Disp: , Rfl:  .  simvastatin (ZOCOR) 40 MG tablet, Take 40 mg by mouth every other day. , Disp: , Rfl:  .  tiotropium (SPIRIVA) 18 MCG inhalation capsule, Place 18 mcg into inhaler and inhale daily., Disp: , Rfl:   Allergies: No Known Allergies  Past Medical History, Surgical history, Social history, and Family History were reviewed and updated.  Review of Systems: As above  Physical Exam:  height is 6' (1.829 m) and weight is 213 lb (96.616 kg). His oral temperature is 97.5 F (36.4 C). His blood pressure is 128/60 and his pulse is 76. His  respiration is 18.   Well-developed and well-nourished white general. Head and neck exam shows no ocular or oral lesions. He has no scleral icterus. He has no adenopathy in the neck. Lungs are clear. Cardiac exam regular rate and rhythm with no murmurs, rubs or bruits. Abdomen is soft. He has good bowel sounds. He's mildly obese. He has no fluid wave. There is no palpable liver or spleen tip. Back exam shows no tenderness over the spine, ribs or hips. Extremities shows no clubbing, cyanosis or edema. Skin exam no rashes, ecchymoses or petechia.  Lab Results  Component Value Date   WBC 2.7* 07/06/2015   HGB 9.2* 07/06/2015   HCT 28.1* 07/06/2015   MCV 109* 07/06/2015   PLT 61* 07/06/2015     Chemistry      Component Value Date/Time   NA 138 07/06/2015 1157   NA 139 06/20/2015 1347   NA 141 06/17/2015 1155   K 4.2 07/06/2015 1157   K 4.1 06/20/2015 1347   K 4.4 06/17/2015 1155   CL 103 06/20/2015 1347   CL 107 06/17/2015 1155   CO2 24 07/06/2015 1157   CO2 26 06/20/2015 1347   CO2 25 06/17/2015 1155   BUN 29.4* 07/06/2015 1157   BUN 20 06/20/2015 1347   BUN 27* 06/17/2015 1155   CREATININE 1.7* 07/06/2015 1157   CREATININE 1.5* 06/20/2015 1347   CREATININE 1.48* 06/17/2015 1155      Component Value Date/Time   CALCIUM 9.2 07/06/2015 1157   CALCIUM 8.2 06/20/2015 1347   CALCIUM 9.0 06/17/2015 1155   ALKPHOS 50 07/06/2015 1157   ALKPHOS 33 06/20/2015 1347   ALKPHOS 36* 10/26/2014 1111   AST 18 07/06/2015 1157   AST 18 06/20/2015 1347   AST 19 10/26/2014 1111   ALT 13 07/06/2015 1157   ALT 11 06/20/2015 1347   ALT 15 10/26/2014 1111   BILITOT 0.35 07/06/2015 1157   BILITOT 0.60 06/20/2015 1347   BILITOT 0.4 10/26/2014 1111         Impression and Plan: Anthony [name] is 75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 As such, I think we are going to have to initiate  therapy on him.  I am pleased that he seemed to do pretty well with his first cycle of treatment. His CBC is actually holding pretty steady.  I don't think we are to do anything different for right now. He will come back a couple weeks to start his second  cycle treatment.  I will give him or cycle of treatment and then repeat a bone marrow test on him.  His wife came with him today. I answered their questions. I spent about 25 minutes with them. Volanda Napoleon, MD 1/26/20175:29 PM

## 2015-07-14 NOTE — Progress Notes (Signed)
This encounter was created in error - please disregard.

## 2015-07-18 ENCOUNTER — Other Ambulatory Visit (HOSPITAL_BASED_OUTPATIENT_CLINIC_OR_DEPARTMENT_OTHER): Payer: Medicare Other

## 2015-07-18 ENCOUNTER — Encounter: Payer: Self-pay | Admitting: Hematology & Oncology

## 2015-07-18 ENCOUNTER — Ambulatory Visit (HOSPITAL_BASED_OUTPATIENT_CLINIC_OR_DEPARTMENT_OTHER): Payer: Medicare Other

## 2015-07-18 ENCOUNTER — Ambulatory Visit (HOSPITAL_BASED_OUTPATIENT_CLINIC_OR_DEPARTMENT_OTHER): Payer: Medicare Other | Admitting: Hematology & Oncology

## 2015-07-18 ENCOUNTER — Telehealth: Payer: Self-pay | Admitting: *Deleted

## 2015-07-18 ENCOUNTER — Other Ambulatory Visit: Payer: Self-pay | Admitting: Family

## 2015-07-18 ENCOUNTER — Ambulatory Visit (HOSPITAL_COMMUNITY)
Admission: RE | Admit: 2015-07-18 | Discharge: 2015-07-18 | Disposition: A | Discharge status: Home / Self Care | Payer: Medicare Other | Source: Ambulatory Visit | Attending: Hematology & Oncology | Admitting: Hematology & Oncology

## 2015-07-18 VITALS — BP 119/62 | HR 105 | Temp 97.6°F | Resp 16 | Ht 72.0 in | Wt 216.0 lb

## 2015-07-18 DIAGNOSIS — D4620 Refractory anemia with excess of blasts, unspecified: Secondary | ICD-10-CM | POA: Diagnosis present

## 2015-07-18 DIAGNOSIS — D4621 Refractory anemia with excess of blasts 1: Secondary | ICD-10-CM

## 2015-07-18 DIAGNOSIS — Z5111 Encounter for antineoplastic chemotherapy: Secondary | ICD-10-CM

## 2015-07-18 DIAGNOSIS — D509 Iron deficiency anemia, unspecified: Secondary | ICD-10-CM

## 2015-07-18 DIAGNOSIS — D649 Anemia, unspecified: Secondary | ICD-10-CM

## 2015-07-18 DIAGNOSIS — D46Z Other myelodysplastic syndromes: Secondary | ICD-10-CM

## 2015-07-18 LAB — CMP (CANCER CENTER ONLY)
ALBUMIN: 3.2 g/dL — AB (ref 3.3–5.5)
ALK PHOS: 52 U/L (ref 26–84)
ALT: 18 U/L (ref 10–47)
AST: 23 U/L (ref 11–38)
BILIRUBIN TOTAL: 0.6 mg/dL (ref 0.20–1.60)
BUN: 27 mg/dL — AB (ref 7–22)
CO2: 27 mEq/L (ref 18–33)
CREATININE: 1.5 mg/dL — AB (ref 0.6–1.2)
Calcium: 9.1 mg/dL (ref 8.0–10.3)
Chloride: 105 mEq/L (ref 98–108)
Glucose, Bld: 122 mg/dL — ABNORMAL HIGH (ref 73–118)
Potassium: 4.3 mEq/L (ref 3.3–4.7)
SODIUM: 139 meq/L (ref 128–145)
TOTAL PROTEIN: 7.9 g/dL (ref 6.4–8.1)

## 2015-07-18 LAB — CBC WITH DIFFERENTIAL (CANCER CENTER ONLY)
BASO#: 0 10*3/uL (ref 0.0–0.2)
BASO%: 1.3 % (ref 0.0–2.0)
EOS%: 1.3 % (ref 0.0–7.0)
Eosinophils Absolute: 0 10*3/uL (ref 0.0–0.5)
HCT: 23.6 % — ABNORMAL LOW (ref 38.7–49.9)
HEMOGLOBIN: 7.6 g/dL — AB (ref 13.0–17.1)
LYMPH#: 0.7 10*3/uL — ABNORMAL LOW (ref 0.9–3.3)
LYMPH%: 41.1 % (ref 14.0–48.0)
MCH: 35.7 pg — ABNORMAL HIGH (ref 28.0–33.4)
MCHC: 32.2 g/dL (ref 32.0–35.9)
MCV: 111 fL — ABNORMAL HIGH (ref 82–98)
MONO#: 0.5 10*3/uL (ref 0.1–0.9)
MONO%: 29.1 % — AB (ref 0.0–13.0)
NEUT%: 27.2 % — ABNORMAL LOW (ref 40.0–80.0)
NEUTROS ABS: 0.4 10*3/uL — AB (ref 1.5–6.5)
PLATELETS: 167 10*3/uL (ref 145–400)
RBC: 2.13 10*6/uL — AB (ref 4.20–5.70)
RDW: 19.6 % — ABNORMAL HIGH (ref 11.1–15.7)
WBC: 1.6 10*3/uL — AB (ref 4.0–10.0)

## 2015-07-18 LAB — CHCC SATELLITE - SMEAR

## 2015-07-18 LAB — PREPARE RBC (CROSSMATCH)

## 2015-07-18 MED ORDER — HEPARIN SOD (PORK) LOCK FLUSH 100 UNIT/ML IV SOLN
500.0000 [IU] | Freq: Once | INTRAVENOUS | Status: AC | PRN
  Administered 2015-07-18: 500 [IU]
  Filled 2015-07-18: qty 5

## 2015-07-18 MED ORDER — SODIUM CHLORIDE 0.9 % IV SOLN
Freq: Once | INTRAVENOUS | Status: AC
  Administered 2015-07-18: 15:00:00 via INTRAVENOUS

## 2015-07-18 MED ORDER — LACTATED RINGERS IV SOLN
75.0000 mg/m2 | Freq: Every day | INTRAVENOUS | Status: DC
  Administered 2015-07-18: 170 mg via INTRAVENOUS
  Filled 2015-07-18: qty 34

## 2015-07-18 MED ORDER — DARBEPOETIN ALFA 500 MCG/ML IJ SOSY
400.0000 ug | PREFILLED_SYRINGE | Freq: Once | INTRAMUSCULAR | Status: AC
  Administered 2015-07-18: 400 ug via SUBCUTANEOUS

## 2015-07-18 MED ORDER — SODIUM CHLORIDE 0.9 % IJ SOLN
10.0000 mL | INTRAMUSCULAR | Status: DC | PRN
  Administered 2015-07-18: 10 mL
  Filled 2015-07-18: qty 10

## 2015-07-18 MED ORDER — SODIUM CHLORIDE 0.9 % IV SOLN
Freq: Once | INTRAVENOUS | Status: AC
  Administered 2015-07-18: 16:00:00 via INTRAVENOUS
  Filled 2015-07-18: qty 4

## 2015-07-18 MED ORDER — DARBEPOETIN ALFA 200 MCG/0.4ML IJ SOSY
PREFILLED_SYRINGE | INTRAMUSCULAR | Status: AC
  Filled 2015-07-18: qty 0.8

## 2015-07-18 NOTE — Telephone Encounter (Signed)
Critical Value ANC 0.4 Dr Ennever notified. No orders at this time 

## 2015-07-18 NOTE — Progress Notes (Signed)
Patient states he had a syncopal episode last Monday. He "passed out" at home. He hit his head in the fall. He didn't call the office nor seek out any medical assessment. He denies any injury. Dr Marin Olp notified.

## 2015-07-18 NOTE — Patient Instructions (Signed)
[facility] Discharge Instructions for Patients Receiving Chemotherapy  Today you received the following chemotherapy agents Vidaza  To help prevent nausea and vomiting after your treatment, we encourage you to take your nausea medications as directed on the bottle.   If you develop nausea and vomiting that is not controlled by your nausea medication, call the clinic.   BELOW ARE SYMPTOMS THAT SHOULD BE REPORTED IMMEDIATELY:  *FEVER GREATER THAN 100.5 F  *CHILLS WITH OR WITHOUT FEVER  NAUSEA AND VOMITING THAT IS NOT CONTROLLED WITH YOUR NAUSEA MEDICATION  *UNUSUAL SHORTNESS OF BREATH  *UNUSUAL BRUISING OR BLEEDING  TENDERNESS IN MOUTH AND THROAT WITH OR WITHOUT PRESENCE OF ULCERS  *URINARY PROBLEMS  *BOWEL PROBLEMS  UNUSUAL RASH Items with * indicate a potential emergency and should be followed up as soon as possible.  Feel free to call the clinic you have any questions or concerns. The clinic phone number is [phone].  Please show the [facility] at check-in to the Emergency Department and triage nurse. Azacitidine suspension for injection (subcutaneous use) What is this medicine? AZACITIDINE (ay [place]) is a chemotherapy drug. This medicine reduces the growth of cancer cells and can suppress the immune system. It is used for treating myelodysplastic syndrome or some types of leukemia. This medicine may be used for other purposes; ask your health care provider or pharmacist if you have questions. What should I tell my health care provider before I take this medicine? They need to know if you have any of these conditions: -infection (especially a virus infection such as chickenpox, cold sores, or herpes) -kidney disease -liver disease -liver tumors -an unusual or allergic reaction to azacitidine, mannitol, other medicines, foods, dyes, or preservatives -pregnant or trying to get pregnant -breast-feeding How should I use this  medicine? This medicine is for injection under the skin. It is administered in a hospital or clinic by a specially trained health care professional. Talk to your pediatrician regarding the use of this medicine in children. While this drug may be prescribed for selected conditions, precautions do apply. Overdosage: If you think you have taken too much of this medicine contact a poison control center or emergency room at once. NOTE: This medicine is only for you. Do not share this medicine with others. What if I miss a dose? It is important not to miss your dose. Call your doctor or health care professional if you are unable to keep an appointment. What may interact with this medicine? Interactions have not been studied. Give your health care provider a list of all the medicines, herbs, non-prescription drugs, or dietary supplements you use. Also tell them if you smoke, drink alcohol, or use illegal drugs. Some items may interact with your medicine. This list may not describe all possible interactions. Give your health care provider a list of all the medicines, herbs, non-prescription drugs, or dietary supplements you use. Also tell them if you smoke, drink alcohol, or use illegal drugs. Some items may interact with your medicine. What should I watch for while using this medicine? Visit your doctor for checks on your progress. This drug may make you feel generally unwell. This is not uncommon, as chemotherapy can affect healthy cells as well as cancer cells. Report any side effects. Continue your course of treatment even though you feel ill unless your doctor tells you to stop. In some cases, you may be given additional medicines to help with side effects. Follow all directions for their  use. Call your doctor or health care professional for advice if you get a fever, chills or sore throat, or other symptoms of a cold or flu. Do not treat yourself. This drug decreases your body's ability to fight  infections. Try to avoid being around people who are sick. This medicine may increase your risk to bruise or bleed. Call your doctor or health care professional if you notice any unusual bleeding. Do not have any vaccinations without your doctor's approval and avoid anyone who has recently had oral polio vaccine. Do not become pregnant while taking this medicine. Women should inform their doctor if they wish to become pregnant or think they might be pregnant. There is a potential for serious side effects to an unborn child. Talk to your health care professional or pharmacist for more information. Do not breast-feed an infant while taking this medicine. If you are a man, you should not father a child while receiving treatment. What side effects may I notice from receiving this medicine? Side effects that you should report to your doctor or health care professional as soon as possible: -allergic reactions like skin rash, itching or hives, swelling of the face, lips, or tongue -low blood counts - this medicine may decrease the number of white blood cells, red blood cells and platelets. You may be at increased risk for infections and bleeding. -signs of infection - fever or chills, cough, sore throat, pain or difficulty passing urine -signs of decreased platelets or bleeding - bruising, pinpoint red spots on the skin, black, tarry stools, blood in the urine -signs of decreased red blood cells - unusually weak or tired, fainting spells, lightheadedness -reactions at the injection site including redness, pain, itching, or bruising -breathing problems -changes in vision -fever -mouth sores -stomach pain -vomiting Side effects that usually do not require medical attention (report to your doctor or health care professional if they continue or are bothersome): -constipation -diarrhea -loss of appetite -nausea -pain or redness at the injection site -weak or tired This list may not describe all  possible side effects. Call your doctor for medical advice about side effects. You may report side effects to FDA at 1-800-FDA-1088. Where should I keep my medicine? This drug is given in a hospital or clinic and will not be stored at home. NOTE: This sheet is a summary. It may not cover all possible information. If you have questions about this medicine, talk to your doctor, pharmacist, or health care provider.    2016, Elsevier/Gold Standard. (2014-02-19 18:13:53)

## 2015-07-18 NOTE — Progress Notes (Signed)
Hematology and Oncology Follow Up Visit  Anthony [name]:[id number] 04/28/1941 75 y.o. 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  Anthony [name] is back for follow-up. He has had live a difficult time. He apparently passed out last week. This happened at home area and this will have an for a few seconds.  He's not had diarrhea. His appetite is doing okay. He's had no nausea vomiting. He's had no fever. He does feel tired.  He's had no rashes. He's had no leg swelling.  There's been no cough or shortness of breath.  He has not noted any fullness. He's had no joint problems.  Overall, his performance status is ECOG 1.    Medications:  Current outpatient prescriptions:  .  Albuterol Sulfate (PROAIR RESPICLICK) 123XX123 (90 BASE) MCG/ACT AEPB, Inhale 2 puffs into the lungs every 6 (six) hours as needed., Disp: , Rfl:  .  bimatoprost (LUMIGAN) 0.01 % SOLN, Place 1 drop into both eyes at bedtime., Disp: , Rfl:  .  budesonide-formoterol (SYMBICORT) 160-4.5 MCG/ACT inhaler, Inhale 2 puffs into the lungs 2 (two) times daily., Disp: 3 Inhaler, Rfl: 3 .  buPROPion (WELLBUTRIN XL) 150 MG 24 hr tablet, Take 150 mg by mouth daily., Disp: , Rfl:  .  Cholecalciferol (VITAMIN D3) 5000 UNITS TABS, Take by mouth daily., Disp: , Rfl:  .  clopidogrel (PLAVIX) 75 MG tablet, Take 75 mg by mouth daily. , Disp: , Rfl:  .  co-enzyme Q-10 30 MG capsule, Take 100 mg by mouth daily., Disp: , Rfl:  .  Cyanocobalamin (VITAMIN B-12) 2500 MCG TABS, Take 5,000 mcg by mouth daily., Disp: 90 tablet, Rfl: 9 .  dexamethasone (DECADRON) 4 MG tablet, Take 2 tablets (8 mg total) by mouth 2 (two) times daily with a meal. Start the day after chemotherapy for 2 days. Take with food., Disp: 30 tablet, Rfl: 1 .  fish oil-omega-3 fatty  acids 1000 MG capsule, Take 1 g by mouth daily., Disp: , Rfl:  .  Flaxseed, Linseed, (FLAX SEED OIL) 1000 MG CAPS, Take 1,000 mg by mouth daily., Disp: , Rfl:  .  lidocaine-prilocaine (EMLA) cream, Apply one teaspoon to port-a-cath 1-2 hours prior to access., Disp: 30 g, Rfl: 3 .  LORazepam (ATIVAN) 0.5 MG tablet, Take 1 tablet (0.5 mg total) by mouth every 6 (six) hours as needed (Nausea or vomiting)., Disp: 30 tablet, Rfl: 0 .  losartan (COZAAR) 50 MG tablet, 25 mg daily., Disp: , Rfl:  .  ondansetron (ZOFRAN) 8 MG tablet, Take 1 tablet (8 mg total) by mouth 2 (two) times daily. Start the day after chemo for 2 days. Then take as needed for nausea or vomiting., Disp: 30 tablet, Rfl: 1 .  pantoprazole (PROTONIX) 40 MG tablet, Take 40 mg by mouth daily., Disp: , Rfl:  .  prochlorperazine (COMPAZINE) 10 MG tablet, Take 1 tablet (10 mg total) by mouth every 6 (six) hours as needed (Nausea or vomiting)., Disp: 30 tablet, Rfl: 1 .  ranitidine (ZANTAC) 300 MG tablet, Take 300 mg by mouth at bedtime. , Disp: , Rfl:  .  sertraline (ZOLOFT) 100 MG tablet, Take 100 mg by mouth daily., Disp: , Rfl: 1 .  SIMBRINZA 1-0.2 % SUSP, , Disp: , Rfl:  .  simvastatin (ZOCOR) 40 MG tablet, Take 40 mg by mouth every other day. , Disp: , Rfl:  .  tiotropium (SPIRIVA) 18 MCG inhalation capsule, Place 18 mcg into inhaler and inhale daily., Disp: , Rfl:   Allergies: No Known Allergies  Past Medical History, Surgical history, Social history, and Family History were reviewed and updated.  Review of Systems: As above  Physical Exam:  height is 6' (1.829 m) and weight is 216 lb (97.977 kg). His oral temperature is 97.6 F (36.4 C). His blood pressure is 119/62 and his pulse is 105. His respiration is 16.   Well-developed and well-nourished white general. Head and neck exam shows no ocular or oral lesions. He has no scleral icterus. He has no adenopathy in the neck. Lungs are clear. Cardiac exam regular rate and rhythm  with no murmurs, rubs or bruits. Abdomen is soft. He has good bowel sounds. He's mildly obese. He has no fluid wave. There is no palpable liver or spleen tip. Back exam shows no tenderness over the spine, ribs or hips. Extremities shows no clubbing, cyanosis or edema. Skin exam no rashes, ecchymoses or petechia.  Lab Results  Component Value Date   WBC 1.6* 07/18/2015   HGB 7.6* 07/18/2015   HCT 23.6* 07/18/2015   MCV 111* 07/18/2015   PLT 167 07/18/2015     Chemistry      Component Value Date/Time   NA 139 07/18/2015 1333   NA 138 07/06/2015 1157   NA 141 06/17/2015 1155   K 4.3 07/18/2015 1333   K 4.2 07/06/2015 1157   K 4.4 06/17/2015 1155   CL 105 07/18/2015 1333   CL 107 06/17/2015 1155   CO2 27 07/18/2015 1333   CO2 24 07/06/2015 1157   CO2 25 06/17/2015 1155   BUN 27* 07/18/2015 1333   BUN 29.4* 07/06/2015 1157   BUN 27* 06/17/2015 1155   CREATININE 1.5* 07/18/2015 1333   CREATININE 1.7* 07/06/2015 1157   CREATININE 1.48* 06/17/2015 1155      Component Value Date/Time   CALCIUM 9.1 07/18/2015 1333   CALCIUM 9.2 07/06/2015 1157   CALCIUM 9.0 06/17/2015 1155   ALKPHOS 52 07/18/2015 1333   ALKPHOS 50 07/06/2015 1157   ALKPHOS 36* 10/26/2014 1111   AST 23 07/18/2015 1333   AST 18 07/06/2015 1157   AST 19 10/26/2014 1111   ALT 18 07/18/2015 1333   ALT 13 07/06/2015 1157   ALT 15 10/26/2014 1111   BILITOT 0.60 07/18/2015 1333   BILITOT 0.35 07/06/2015 1157   BILITOT 0.4 10/26/2014 1111         Impression and Plan: 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   This will be a second cycle of treatment. It is still too early to know what type of response we will get. However, I have noted that the monocyte percentage has been coming down slowly. Hopefully, this will be a good indicator of response.   I do think that we have to transfuse him. I think 2 units will be appropriate. I want him to feel better.   We had a huge [employer] vs [employer] basketball  game coming up on Thursday. I want him feeling well so that he can watch the game.   I will still plan for 4 cycles of treatment before I do another bone marrow test.   He does have the trisomy 11 abnormality which we can look at in the future.  I did fill out a handicapped parking  sticker form for him.   I spent about 25 minutes with he and his wife.Marland Kitchen   Volanda Napoleon, MD 2/6/20173:02 PM

## 2015-07-19 ENCOUNTER — Ambulatory Visit (HOSPITAL_BASED_OUTPATIENT_CLINIC_OR_DEPARTMENT_OTHER): Payer: Medicare Other

## 2015-07-19 VITALS — BP 153/70 | HR 87 | Temp 97.7°F | Resp 18

## 2015-07-19 DIAGNOSIS — Z5111 Encounter for antineoplastic chemotherapy: Secondary | ICD-10-CM

## 2015-07-19 DIAGNOSIS — D4621 Refractory anemia with excess of blasts 1: Secondary | ICD-10-CM

## 2015-07-19 DIAGNOSIS — D509 Iron deficiency anemia, unspecified: Secondary | ICD-10-CM

## 2015-07-19 DIAGNOSIS — D4620 Refractory anemia with excess of blasts, unspecified: Secondary | ICD-10-CM | POA: Diagnosis not present

## 2015-07-19 LAB — IRON AND TIBC
%SAT: 53 % (ref 20–55)
IRON: 163 ug/dL (ref 42–163)
TIBC: 308 ug/dL (ref 202–409)
UIBC: 144 ug/dL (ref 117–376)

## 2015-07-19 LAB — LACTATE DEHYDROGENASE: LDH: 265 U/L — ABNORMAL HIGH (ref 125–245)

## 2015-07-19 LAB — RETICULOCYTES: Reticulocyte Count: 1.6 % (ref 0.6–2.6)

## 2015-07-19 LAB — FERRITIN: Ferritin: 749 ng/ml — ABNORMAL HIGH (ref 22–316)

## 2015-07-19 MED ORDER — SODIUM CHLORIDE 0.9 % IV SOLN
Freq: Once | INTRAVENOUS | Status: AC
  Administered 2015-07-19: 08:00:00 via INTRAVENOUS

## 2015-07-19 MED ORDER — ACETAMINOPHEN 325 MG PO TABS
650.0000 mg | ORAL_TABLET | Freq: Once | ORAL | Status: AC
  Administered 2015-07-19: 650 mg via ORAL

## 2015-07-19 MED ORDER — DIPHENHYDRAMINE HCL 25 MG PO CAPS
25.0000 mg | ORAL_CAPSULE | Freq: Once | ORAL | Status: AC
  Administered 2015-07-19: 25 mg via ORAL

## 2015-07-19 MED ORDER — DIPHENHYDRAMINE HCL 25 MG PO CAPS
ORAL_CAPSULE | ORAL | Status: AC
  Filled 2015-07-19: qty 1

## 2015-07-19 MED ORDER — LACTATED RINGERS IV SOLN
75.0000 mg/m2 | Freq: Every day | INTRAVENOUS | Status: DC
  Administered 2015-07-19: 170 mg via INTRAVENOUS
  Filled 2015-07-19: qty 34

## 2015-07-19 MED ORDER — FUROSEMIDE 10 MG/ML IJ SOLN
20.0000 mg | Freq: Once | INTRAMUSCULAR | Status: AC
  Administered 2015-07-19: 20 mg via INTRAVENOUS

## 2015-07-19 MED ORDER — SODIUM CHLORIDE 0.9 % IJ SOLN
10.0000 mL | INTRAMUSCULAR | Status: DC | PRN
  Administered 2015-07-19: 10 mL
  Filled 2015-07-19: qty 10

## 2015-07-19 MED ORDER — FUROSEMIDE 10 MG/ML IJ SOLN
INTRAMUSCULAR | Status: AC
  Filled 2015-07-19: qty 4

## 2015-07-19 MED ORDER — HEPARIN SOD (PORK) LOCK FLUSH 100 UNIT/ML IV SOLN
500.0000 [IU] | Freq: Once | INTRAVENOUS | Status: AC | PRN
  Administered 2015-07-19: 500 [IU]
  Filled 2015-07-19: qty 5

## 2015-07-19 MED ORDER — ACETAMINOPHEN 325 MG PO TABS
ORAL_TABLET | ORAL | Status: AC
  Filled 2015-07-19: qty 2

## 2015-07-19 MED ORDER — SODIUM CHLORIDE 0.9 % IV SOLN
Freq: Once | INTRAVENOUS | Status: AC
  Administered 2015-07-19: 08:00:00 via INTRAVENOUS
  Filled 2015-07-19: qty 4

## 2015-07-19 NOTE — Patient Instructions (Signed)

## 2015-07-20 ENCOUNTER — Ambulatory Visit (HOSPITAL_BASED_OUTPATIENT_CLINIC_OR_DEPARTMENT_OTHER): Payer: Medicare Other

## 2015-07-20 ENCOUNTER — Other Ambulatory Visit: Payer: Self-pay | Admitting: Family

## 2015-07-20 VITALS — BP 126/6 | HR 87 | Temp 97.6°F | Resp 20

## 2015-07-20 DIAGNOSIS — D4621 Refractory anemia with excess of blasts 1: Secondary | ICD-10-CM

## 2015-07-20 DIAGNOSIS — D4620 Refractory anemia with excess of blasts, unspecified: Secondary | ICD-10-CM

## 2015-07-20 DIAGNOSIS — Z5111 Encounter for antineoplastic chemotherapy: Secondary | ICD-10-CM | POA: Diagnosis not present

## 2015-07-20 LAB — TYPE AND SCREEN
ABO/RH(D): O POS
ANTIBODY SCREEN: NEGATIVE
UNIT DIVISION: 0
UNIT DIVISION: 0

## 2015-07-20 MED ORDER — AZACITIDINE CHEMO INJECTION 100 MG
75.0000 mg/m2 | Freq: Every day | INTRAMUSCULAR | Status: DC
  Administered 2015-07-20: 170 mg via INTRAVENOUS
  Filled 2015-07-20: qty 34

## 2015-07-20 MED ORDER — HEPARIN SOD (PORK) LOCK FLUSH 100 UNIT/ML IV SOLN
500.0000 [IU] | Freq: Once | INTRAVENOUS | Status: AC | PRN
Start: 2015-07-20 — End: 2015-07-20
  Administered 2015-07-20: 500 [IU]
  Filled 2015-07-20: qty 5

## 2015-07-20 MED ORDER — SODIUM CHLORIDE 0.9 % IJ SOLN
10.0000 mL | INTRAMUSCULAR | Status: AC | PRN
  Administered 2015-07-20: 10 mL
  Filled 2015-07-20: qty 10

## 2015-07-20 MED ORDER — SODIUM CHLORIDE 0.9 % IV SOLN
Freq: Once | INTRAVENOUS | Status: AC
  Administered 2015-07-20: 08:00:00 via INTRAVENOUS

## 2015-07-20 MED ORDER — DIPHENHYDRAMINE HCL 25 MG PO CAPS
ORAL_CAPSULE | ORAL | Status: AC
  Filled 2015-07-20: qty 1

## 2015-07-20 MED ORDER — ACETAMINOPHEN 325 MG PO TABS
ORAL_TABLET | ORAL | Status: AC
  Filled 2015-07-20: qty 2

## 2015-07-20 MED ORDER — SODIUM CHLORIDE 0.9 % IJ SOLN
10.0000 mL | INTRAMUSCULAR | Status: DC | PRN
  Filled 2015-07-20: qty 10

## 2015-07-20 MED ORDER — FAMOTIDINE 20 MG PO TABS
20.0000 mg | ORAL_TABLET | Freq: Once | ORAL | Status: AC
  Administered 2015-07-20: 20 mg via ORAL

## 2015-07-20 MED ORDER — SODIUM CHLORIDE 0.9 % IV SOLN
Freq: Once | INTRAVENOUS | Status: AC
  Administered 2015-07-20: 08:00:00 via INTRAVENOUS
  Filled 2015-07-20: qty 4

## 2015-07-20 NOTE — Patient Instructions (Signed)
[facility] Discharge Instructions for Patients Receiving Chemotherapy  Today you received the following chemotherapy agents Vidaza  To help prevent nausea and vomiting after your treatment, we encourage you to take your nausea medications as directed on the bottle.   If you develop nausea and vomiting that is not controlled by your nausea medication, call the clinic.   BELOW ARE SYMPTOMS THAT SHOULD BE REPORTED IMMEDIATELY:  *FEVER GREATER THAN 100.5 F  *CHILLS WITH OR WITHOUT FEVER  NAUSEA AND VOMITING THAT IS NOT CONTROLLED WITH YOUR NAUSEA MEDICATION  *UNUSUAL SHORTNESS OF BREATH  *UNUSUAL BRUISING OR BLEEDING  TENDERNESS IN MOUTH AND THROAT WITH OR WITHOUT PRESENCE OF ULCERS  *URINARY PROBLEMS  *BOWEL PROBLEMS  UNUSUAL RASH Items with * indicate a potential emergency and should be followed up as soon as possible.  Feel free to call the clinic you have any questions or concerns. The clinic phone number is [phone].  Please show the [facility] at check-in to the Emergency Department and triage nurse. Azacitidine suspension for injection (subcutaneous use) What is this medicine? AZACITIDINE (ay [place]) is a chemotherapy drug. This medicine reduces the growth of cancer cells and can suppress the immune system. It is used for treating myelodysplastic syndrome or some types of leukemia. This medicine may be used for other purposes; ask your health care provider or pharmacist if you have questions. What should I tell my health care provider before I take this medicine? They need to know if you have any of these conditions: -infection (especially a virus infection such as chickenpox, cold sores, or herpes) -kidney disease -liver disease -liver tumors -an unusual or allergic reaction to azacitidine, mannitol, other medicines, foods, dyes, or preservatives -pregnant or trying to get pregnant -breast-feeding How should I use this  medicine? This medicine is for injection under the skin. It is administered in a hospital or clinic by a specially trained health care professional. Talk to your pediatrician regarding the use of this medicine in children. While this drug may be prescribed for selected conditions, precautions do apply. Overdosage: If you think you have taken too much of this medicine contact a poison control center or emergency room at once. NOTE: This medicine is only for you. Do not share this medicine with others. What if I miss a dose? It is important not to miss your dose. Call your doctor or health care professional if you are unable to keep an appointment. What may interact with this medicine? Interactions have not been studied. Give your health care provider a list of all the medicines, herbs, non-prescription drugs, or dietary supplements you use. Also tell them if you smoke, drink alcohol, or use illegal drugs. Some items may interact with your medicine. This list may not describe all possible interactions. Give your health care provider a list of all the medicines, herbs, non-prescription drugs, or dietary supplements you use. Also tell them if you smoke, drink alcohol, or use illegal drugs. Some items may interact with your medicine. What should I watch for while using this medicine? Visit your doctor for checks on your progress. This drug may make you feel generally unwell. This is not uncommon, as chemotherapy can affect healthy cells as well as cancer cells. Report any side effects. Continue your course of treatment even though you feel ill unless your doctor tells you to stop. In some cases, you may be given additional medicines to help with side effects. Follow all directions for their  use. Call your doctor or health care professional for advice if you get a fever, chills or sore throat, or other symptoms of a cold or flu. Do not treat yourself. This drug decreases your body's ability to fight  infections. Try to avoid being around people who are sick. This medicine may increase your risk to bruise or bleed. Call your doctor or health care professional if you notice any unusual bleeding. Do not have any vaccinations without your doctor's approval and avoid anyone who has recently had oral polio vaccine. Do not become pregnant while taking this medicine. Women should inform their doctor if they wish to become pregnant or think they might be pregnant. There is a potential for serious side effects to an unborn child. Talk to your health care professional or pharmacist for more information. Do not breast-feed an infant while taking this medicine. If you are a man, you should not father a child while receiving treatment. What side effects may I notice from receiving this medicine? Side effects that you should report to your doctor or health care professional as soon as possible: -allergic reactions like skin rash, itching or hives, swelling of the face, lips, or tongue -low blood counts - this medicine may decrease the number of white blood cells, red blood cells and platelets. You may be at increased risk for infections and bleeding. -signs of infection - fever or chills, cough, sore throat, pain or difficulty passing urine -signs of decreased platelets or bleeding - bruising, pinpoint red spots on the skin, black, tarry stools, blood in the urine -signs of decreased red blood cells - unusually weak or tired, fainting spells, lightheadedness -reactions at the injection site including redness, pain, itching, or bruising -breathing problems -changes in vision -fever -mouth sores -stomach pain -vomiting Side effects that usually do not require medical attention (report to your doctor or health care professional if they continue or are bothersome): -constipation -diarrhea -loss of appetite -nausea -pain or redness at the injection site -weak or tired This list may not describe all  possible side effects. Call your doctor for medical advice about side effects. You may report side effects to FDA at 1-800-FDA-1088. Where should I keep my medicine? This drug is given in a hospital or clinic and will not be stored at home. NOTE: This sheet is a summary. It may not cover all possible information. If you have questions about this medicine, talk to your doctor, pharmacist, or health care provider.    2016, Elsevier/Gold Standard. (2014-02-19 18:13:53)

## 2015-07-21 ENCOUNTER — Ambulatory Visit (HOSPITAL_BASED_OUTPATIENT_CLINIC_OR_DEPARTMENT_OTHER): Payer: Medicare Other

## 2015-07-21 VITALS — BP 105/60 | HR 82 | Temp 96.8°F

## 2015-07-21 DIAGNOSIS — Z5111 Encounter for antineoplastic chemotherapy: Secondary | ICD-10-CM | POA: Diagnosis not present

## 2015-07-21 DIAGNOSIS — D4621 Refractory anemia with excess of blasts 1: Secondary | ICD-10-CM | POA: Diagnosis not present

## 2015-07-21 DIAGNOSIS — D4620 Refractory anemia with excess of blasts, unspecified: Secondary | ICD-10-CM

## 2015-07-21 MED ORDER — HEPARIN SOD (PORK) LOCK FLUSH 100 UNIT/ML IV SOLN
500.0000 [IU] | Freq: Once | INTRAVENOUS | Status: AC | PRN
  Administered 2015-07-21: 500 [IU]
  Filled 2015-07-21: qty 5

## 2015-07-21 MED ORDER — LACTATED RINGERS IV SOLN
75.0000 mg/m2 | Freq: Every day | INTRAVENOUS | Status: DC
  Administered 2015-07-21: 170 mg via INTRAVENOUS
  Filled 2015-07-21: qty 34

## 2015-07-21 MED ORDER — SODIUM CHLORIDE 0.9 % IV SOLN
Freq: Once | INTRAVENOUS | Status: AC
  Administered 2015-07-21: 08:00:00 via INTRAVENOUS

## 2015-07-21 MED ORDER — SODIUM CHLORIDE 0.9 % IJ SOLN
10.0000 mL | INTRAMUSCULAR | Status: DC | PRN
  Administered 2015-07-21: 10 mL
  Filled 2015-07-21: qty 10

## 2015-07-21 MED ORDER — SODIUM CHLORIDE 0.9 % IV SOLN
Freq: Once | INTRAVENOUS | Status: AC
  Administered 2015-07-21: 08:00:00 via INTRAVENOUS
  Filled 2015-07-21: qty 4

## 2015-07-21 NOTE — Patient Instructions (Addendum)
[facility] Discharge Instructions for Patients Receiving Chemotherapy  Today you received the following chemotherapy agents Vidaza  To help prevent nausea and vomiting after your treatment, we encourage you to take your nausea medication   If you develop nausea and vomiting that is not controlled by your nausea medication, call the clinic.   BELOW ARE SYMPTOMS THAT SHOULD BE REPORTED IMMEDIATELY:  *FEVER GREATER THAN 100.5 F  *CHILLS WITH OR WITHOUT FEVER  NAUSEA AND VOMITING THAT IS NOT CONTROLLED WITH YOUR NAUSEA MEDICATION  *UNUSUAL SHORTNESS OF BREATH  *UNUSUAL BRUISING OR BLEEDING  TENDERNESS IN MOUTH AND THROAT WITH OR WITHOUT PRESENCE OF ULCERS  *URINARY PROBLEMS  *BOWEL PROBLEMS  UNUSUAL RASH Items with * indicate a potential emergency and should be followed up as soon as possible.  Feel free to call the clinic you have any questions or concerns. The clinic phone number is [phone].  Please show the CHEMO ALERT CARD at check-in to the Emergency Department and triage nurse.   

## 2015-07-22 ENCOUNTER — Other Ambulatory Visit: Payer: Self-pay | Admitting: *Deleted

## 2015-07-22 ENCOUNTER — Ambulatory Visit (HOSPITAL_BASED_OUTPATIENT_CLINIC_OR_DEPARTMENT_OTHER): Payer: Medicare Other

## 2015-07-22 VITALS — BP 139/67 | HR 91 | Temp 97.7°F | Resp 16

## 2015-07-22 DIAGNOSIS — D4620 Refractory anemia with excess of blasts, unspecified: Secondary | ICD-10-CM

## 2015-07-22 DIAGNOSIS — D4621 Refractory anemia with excess of blasts 1: Secondary | ICD-10-CM | POA: Diagnosis not present

## 2015-07-22 DIAGNOSIS — Z5111 Encounter for antineoplastic chemotherapy: Secondary | ICD-10-CM | POA: Diagnosis not present

## 2015-07-22 MED ORDER — HEPARIN SOD (PORK) LOCK FLUSH 100 UNIT/ML IV SOLN
500.0000 [IU] | Freq: Once | INTRAVENOUS | Status: AC | PRN
  Administered 2015-07-22: 500 [IU]
  Filled 2015-07-22: qty 5

## 2015-07-22 MED ORDER — SODIUM CHLORIDE 0.9 % IV SOLN
Freq: Once | INTRAVENOUS | Status: AC
  Administered 2015-07-22: 09:00:00 via INTRAVENOUS
  Filled 2015-07-22: qty 4

## 2015-07-22 MED ORDER — LACTATED RINGERS IV SOLN
75.0000 mg/m2 | Freq: Every day | INTRAVENOUS | Status: DC
  Administered 2015-07-22: 170 mg via INTRAVENOUS
  Filled 2015-07-22: qty 34

## 2015-07-22 MED ORDER — SODIUM CHLORIDE 0.9 % IJ SOLN
10.0000 mL | INTRAMUSCULAR | Status: DC | PRN
  Administered 2015-07-22: 10 mL
  Filled 2015-07-22: qty 10

## 2015-07-22 MED ORDER — SODIUM CHLORIDE 0.9 % IV SOLN
Freq: Once | INTRAVENOUS | Status: AC
  Administered 2015-07-22: 09:00:00 via INTRAVENOUS

## 2015-07-22 NOTE — Patient Instructions (Signed)
[facility] Discharge Instructions for Patients Receiving Chemotherapy  Today you received the following chemotherapy agents Vidaza  To help prevent nausea and vomiting after your treatment, we encourage you to take your nausea medication   If you develop nausea and vomiting that is not controlled by your nausea medication, call the clinic.   BELOW ARE SYMPTOMS THAT SHOULD BE REPORTED IMMEDIATELY:  *FEVER GREATER THAN 100.5 F  *CHILLS WITH OR WITHOUT FEVER  NAUSEA AND VOMITING THAT IS NOT CONTROLLED WITH YOUR NAUSEA MEDICATION  *UNUSUAL SHORTNESS OF BREATH  *UNUSUAL BRUISING OR BLEEDING  TENDERNESS IN MOUTH AND THROAT WITH OR WITHOUT PRESENCE OF ULCERS  *URINARY PROBLEMS  *BOWEL PROBLEMS  UNUSUAL RASH Items with * indicate a potential emergency and should be followed up as soon as possible.  Feel free to call the clinic you have any questions or concerns. The clinic phone number is [phone].  Please show the CHEMO ALERT CARD at check-in to the Emergency Department and triage nurse.   

## 2015-07-25 ENCOUNTER — Other Ambulatory Visit (HOSPITAL_BASED_OUTPATIENT_CLINIC_OR_DEPARTMENT_OTHER): Payer: Medicare Other

## 2015-07-25 ENCOUNTER — Ambulatory Visit (HOSPITAL_BASED_OUTPATIENT_CLINIC_OR_DEPARTMENT_OTHER): Payer: Medicare Other

## 2015-07-25 VITALS — BP 146/76 | HR 73 | Resp 20

## 2015-07-25 DIAGNOSIS — D4621 Refractory anemia with excess of blasts 1: Secondary | ICD-10-CM | POA: Diagnosis not present

## 2015-07-25 DIAGNOSIS — D4620 Refractory anemia with excess of blasts, unspecified: Secondary | ICD-10-CM

## 2015-07-25 DIAGNOSIS — Z452 Encounter for adjustment and management of vascular access device: Secondary | ICD-10-CM | POA: Diagnosis not present

## 2015-07-25 LAB — CBC WITH DIFFERENTIAL (CANCER CENTER ONLY)
BASO#: 0 10*3/uL (ref 0.0–0.2)
BASO%: 0.4 % (ref 0.0–2.0)
EOS%: 0 % (ref 0.0–7.0)
Eosinophils Absolute: 0 10*3/uL (ref 0.0–0.5)
HCT: 27.7 % — ABNORMAL LOW (ref 38.7–49.9)
HGB: 9.3 g/dL — ABNORMAL LOW (ref 13.0–17.1)
LYMPH#: 0.9 10*3/uL (ref 0.9–3.3)
LYMPH%: 39.1 % (ref 14.0–48.0)
MCH: 34.2 pg — ABNORMAL HIGH (ref 28.0–33.4)
MCHC: 33.6 g/dL (ref 32.0–35.9)
MCV: 102 fL — ABNORMAL HIGH (ref 82–98)
MONO#: 0.3 10*3/uL (ref 0.1–0.9)
MONO%: 10.9 % (ref 0.0–13.0)
NEUT#: 1.1 10*3/uL — ABNORMAL LOW (ref 1.5–6.5)
NEUT%: 49.6 % (ref 40.0–80.0)
PLATELETS: 141 10*3/uL — AB (ref 145–400)
RBC: 2.72 10*6/uL — AB (ref 4.20–5.70)
RDW: 18.1 % — ABNORMAL HIGH (ref 11.1–15.7)
WBC: 2.3 10*3/uL — AB (ref 4.0–10.0)

## 2015-07-25 LAB — CMP (CANCER CENTER ONLY)
ALK PHOS: 40 U/L (ref 26–84)
ALT: 19 U/L (ref 10–47)
AST: 19 U/L (ref 11–38)
Albumin: 3.3 g/dL (ref 3.3–5.5)
BILIRUBIN TOTAL: 0.7 mg/dL (ref 0.20–1.60)
BUN: 29 mg/dL — AB (ref 7–22)
CO2: 27 mEq/L (ref 18–33)
CREATININE: 1.4 mg/dL — AB (ref 0.6–1.2)
Calcium: 9.1 mg/dL (ref 8.0–10.3)
Chloride: 102 mEq/L (ref 98–108)
Glucose, Bld: 94 mg/dL (ref 73–118)
POTASSIUM: 4 meq/L (ref 3.3–4.7)
Sodium: 136 mEq/L (ref 128–145)
TOTAL PROTEIN: 7.7 g/dL (ref 6.4–8.1)

## 2015-07-25 LAB — TECHNOLOGIST REVIEW CHCC SATELLITE

## 2015-07-25 MED ORDER — HEPARIN SOD (PORK) LOCK FLUSH 100 UNIT/ML IV SOLN
500.0000 [IU] | Freq: Once | INTRAVENOUS | Status: AC
  Administered 2015-07-25: 500 [IU] via INTRAVENOUS
  Filled 2015-07-25: qty 5

## 2015-07-25 MED ORDER — SODIUM CHLORIDE 0.9% FLUSH
10.0000 mL | INTRAVENOUS | Status: DC | PRN
  Administered 2015-07-25: 10 mL via INTRAVENOUS
  Filled 2015-07-25: qty 10

## 2015-07-25 NOTE — Progress Notes (Signed)
Discussed labs with patient, no transfusion indicated.

## 2015-07-25 NOTE — Patient Instructions (Signed)

## 2015-08-02 ENCOUNTER — Encounter: Payer: Self-pay | Admitting: Family

## 2015-08-02 ENCOUNTER — Ambulatory Visit (HOSPITAL_BASED_OUTPATIENT_CLINIC_OR_DEPARTMENT_OTHER): Payer: Medicare Other | Admitting: Family

## 2015-08-02 ENCOUNTER — Other Ambulatory Visit: Payer: Medicare Other

## 2015-08-02 ENCOUNTER — Ambulatory Visit: Payer: Medicare Other | Admitting: Family

## 2015-08-02 ENCOUNTER — Other Ambulatory Visit (HOSPITAL_BASED_OUTPATIENT_CLINIC_OR_DEPARTMENT_OTHER): Payer: Medicare Other

## 2015-08-02 VITALS — BP 112/62 | HR 93 | Temp 97.6°F | Resp 18 | Ht 72.0 in | Wt 212.0 lb

## 2015-08-02 DIAGNOSIS — R293 Abnormal posture: Secondary | ICD-10-CM

## 2015-08-02 DIAGNOSIS — E631 Imbalance of constituents of food intake: Secondary | ICD-10-CM

## 2015-08-02 DIAGNOSIS — D4620 Refractory anemia with excess of blasts, unspecified: Secondary | ICD-10-CM

## 2015-08-02 DIAGNOSIS — D469 Myelodysplastic syndrome, unspecified: Secondary | ICD-10-CM

## 2015-08-02 LAB — CBC WITH DIFFERENTIAL (CANCER CENTER ONLY)
BASO#: 0 10*3/uL (ref 0.0–0.2)
BASO%: 1.1 % (ref 0.0–2.0)
EOS ABS: 0 10*3/uL (ref 0.0–0.5)
EOS%: 0.6 % (ref 0.0–7.0)
HEMATOCRIT: 26.5 % — AB (ref 38.7–49.9)
HEMOGLOBIN: 8.6 g/dL — AB (ref 13.0–17.1)
LYMPH#: 0.5 10*3/uL — AB (ref 0.9–3.3)
LYMPH%: 30.3 % (ref 14.0–48.0)
MCH: 33.6 pg — AB (ref 28.0–33.4)
MCHC: 32.5 g/dL (ref 32.0–35.9)
MCV: 104 fL — ABNORMAL HIGH (ref 82–98)
MONO#: 0.3 10*3/uL (ref 0.1–0.9)
MONO%: 17.4 % — AB (ref 0.0–13.0)
NEUT%: 50.6 % (ref 40.0–80.0)
NEUTROS ABS: 0.9 10*3/uL — AB (ref 1.5–6.5)
Platelets: 56 10*3/uL — ABNORMAL LOW (ref 145–400)
RBC: 2.56 10*6/uL — AB (ref 4.20–5.70)
RDW: 19.4 % — ABNORMAL HIGH (ref 11.1–15.7)
WBC: 1.8 10*3/uL — AB (ref 4.0–10.0)

## 2015-08-02 LAB — BASIC METABOLIC PANEL - CANCER CENTER ONLY
BUN: 22 mg/dL (ref 7–22)
CO2: 24 meq/L (ref 18–33)
Calcium: 8.7 mg/dL (ref 8.0–10.3)
Chloride: 105 mEq/L (ref 98–108)
Creat: 1.2 mg/dl (ref 0.6–1.2)
GLUCOSE: 122 mg/dL — AB (ref 73–118)
POTASSIUM: 3.6 meq/L (ref 3.3–4.7)
SODIUM: 135 meq/L (ref 128–145)

## 2015-08-02 MED ORDER — PREDNISONE 20 MG PO TABS: 20.0000 mg | ORAL_TABLET | Freq: Every day | ORAL | Status: DC

## 2015-08-02 MED FILL — predniSONE 20 MG TABS: 20 | 30 days supply | Qty: 30 | Fill #0 | Status: TO

## 2015-08-02 NOTE — Progress Notes (Signed)
Hematology and Oncology Follow Up Visit  Anthony [name] [id number] 12/27/1940 75 y.o. 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 He is feeling quite fatigued and has had episodes of dizziness. He has not had any more falls since January. His wife states that he does not like to use his walker and is unstable on his feet sometimes. We discussed his using a cane for balance and he is agreeable to trying this.  He states that for the 2-3 days after chemo his energy is great with the Decadron. By the 4th day, when he is no longer taking the steroid, he becomes tired.  His appetite is good and he is drinking 1 boost a day. He states that he is staying well hydrated. His weight is down 4 lbs since his last visit. We will have his supplement with 2 boost a day and see if this helps.  No fever, chills, n/v, cough, rash, headache, SOB, chest pain, palpitations, night sweats, abdominal pain or changes in bowel or bladder habits. No episodes of bleeding or bruising. No lymphadenopathy found on exam.  No swelling or tenderness in his extremities.The numbness and tingling in his toes is unchanged.He denies joint aches or "bone" pain.   Medications:    Medication List       This list is accurate as of: 08/02/15 11:08 AM.  Always use your most recent med list.               bimatoprost 0.01 % Soln  Commonly known as:  LUMIGAN  Place 1 drop into both eyes at bedtime.     budesonide-formoterol 160-4.5 MCG/ACT inhaler  Commonly known as:  SYMBICORT  Inhale 2 puffs into the lungs 2 (two) times daily.     buPROPion 150 MG 24 hr tablet  Commonly known as:  WELLBUTRIN XL  Take 150 mg by mouth daily.     clopidogrel 75 MG tablet  Commonly known as:   PLAVIX  Take 75 mg by mouth daily.     co-enzyme Q-10 30 MG capsule  Take 100 mg by mouth daily.     Cyanocobalamin 2500 MCG Tabs  Take 5,000 mcg by mouth daily.     dexamethasone 4 MG tablet  Commonly known as:  DECADRON  Take 2 tablets (8 mg total) by mouth 2 (two) times daily with a meal. Start the day after chemotherapy for 2 days. Take with food.     fish oil-omega-3 fatty acids 1000 MG capsule  Take 1 g by mouth daily.     Flax Seed Oil 1000 MG Caps  Take 1,000 mg by mouth daily.     lidocaine-prilocaine cream  Commonly known as:  EMLA  Apply one teaspoon to port-a-cath 1-2 hours prior to access.     LORazepam 0.5 MG tablet  Commonly known as:  ATIVAN  Take 1 tablet (0.5 mg total) by mouth every 6 (six) hours as needed (Nausea or vomiting).     losartan 50 MG tablet  Commonly known as:  COZAAR  25 mg daily.     ondansetron 8 MG tablet  Commonly known as:  ZOFRAN  Take 1 tablet (8 mg total) by mouth 2 (two) times daily. Start the day  after chemo for 2 days. Then take as needed for nausea or vomiting.     pantoprazole 40 MG tablet  Commonly known as:  PROTONIX  Take 40 mg by mouth daily.     predniSONE 20 MG tablet  Commonly known as:  DELTASONE  Take 1 tablet (20 mg total) by mouth daily with breakfast.     PROAIR RESPICLICK 123XX123 (90 Base) MCG/ACT Aepb  Generic drug:  Albuterol Sulfate  Inhale 2 puffs into the lungs every 6 (six) hours as needed.     prochlorperazine 10 MG tablet  Commonly known as:  COMPAZINE  Take 1 tablet (10 mg total) by mouth every 6 (six) hours as needed (Nausea or vomiting).     ranitidine 300 MG tablet  Commonly known as:  ZANTAC  Take 300 mg by mouth at bedtime.     sertraline 100 MG tablet  Commonly known as:  ZOLOFT  Take 100 mg by mouth daily.     SIMBRINZA 1-0.2 % Susp  Generic drug:  Brinzolamide-Brimonidine     simvastatin 40 MG tablet  Commonly known as:  ZOCOR  Take 40 mg by mouth every other day.     tiotropium  18 MCG inhalation capsule  Commonly known as:  SPIRIVA  Place 18 mcg into inhaler and inhale daily.     Vitamin D3 5000 units Tabs  Take by mouth daily.        Allergies: No Known Allergies  Past Medical History, Surgical history, Social history, and Family History were reviewed and updated.  Review of Systems: All other 10 point review of systems is negative.   Physical Exam:  height is 6' (1.829 m) and weight is 212 lb (96.163 kg). His oral temperature is 97.6 F (36.4 C). His blood pressure is 112/62 and his pulse is 93. His respiration is 18.   Wt Readings from Last 3 Encounters:  08/02/15 212 lb (96.163 kg)  07/18/15 216 lb (97.977 kg)  07/06/15 213 lb (96.616 kg)    Ocular: Sclerae unicteric, pupils equal, round and reactive to light Ear-nose-throat: Oropharynx clear, dentition fair Lymphatic: No cervical supraclavicular or axillary adenopathy Lungs no rales or rhonchi, good excursion bilaterally Heart regular rate and rhythm, no murmur appreciated Abd soft, nontender, positive bowel sounds, no oliver or spleen tip palpated on exam  MSK no focal spinal tenderness, no joint edema Neuro: non-focal, well-oriented, appropriate affect  Lab Results  Component Value Date   WBC 1.8* 08/02/2015   HGB 8.6* 08/02/2015   HCT 26.5* 08/02/2015   MCV 104* 08/02/2015   PLT 56* 08/02/2015   Lab Results  Component Value Date   FERRITIN 749* 07/18/2015   IRON 163 07/18/2015   TIBC 308 07/18/2015   UIBC 144 07/18/2015   IRONPCTSAT 53 07/18/2015   Lab Results  Component Value Date   RETICCTPCT 1.7 05/30/2015   RBC 2.56* 08/02/2015   RETICCTABS 39.4 05/30/2015   No results found for: KPAFRELGTCHN, LAMBDASER, KAPLAMBRATIO No results found for: Kandis Cocking, IGMSERUM Lab Results  Component Value Date   TOTALPROTELP 7.3 01/01/2014   ALBUMINELP 55.1* 01/01/2014   A1GS 6.9* 01/01/2014   A2GS 8.9 01/01/2014   BETS 7.9* 01/01/2014   BETA2SER 6.7* 01/01/2014   GAMS 14.5  01/01/2014   MSPIKE NOT DET 01/01/2014   SPEI * 01/01/2014     Chemistry      Component Value Date/Time   NA 135 08/02/2015 0831   NA 138 07/06/2015 1157   NA 141 06/17/2015  1155   K 3.6 08/02/2015 0831   K 4.2 07/06/2015 1157   K 4.4 06/17/2015 1155   CL 105 08/02/2015 0831   CL 107 06/17/2015 1155   CO2 24 08/02/2015 0831   CO2 24 07/06/2015 1157   CO2 25 06/17/2015 1155   BUN 22 08/02/2015 0831   BUN 29.4* 07/06/2015 1157   BUN 27* 06/17/2015 1155   CREATININE 1.2 08/02/2015 0831   CREATININE 1.7* 07/06/2015 1157   CREATININE 1.48* 06/17/2015 1155      Component Value Date/Time   CALCIUM 8.7 08/02/2015 0831   CALCIUM 9.2 07/06/2015 1157   CALCIUM 9.0 06/17/2015 1155   ALKPHOS 40 07/25/2015 1049   ALKPHOS 50 07/06/2015 1157   ALKPHOS 36* 10/26/2014 1111   AST 19 07/25/2015 1049   AST 18 07/06/2015 1157   AST 19 10/26/2014 1111   ALT 19 07/25/2015 1049   ALT 13 07/06/2015 1157   ALT 15 10/26/2014 1111   BILITOT 0.70 07/25/2015 1049   BILITOT 0.35 07/06/2015 1157   BILITOT 0.4 10/26/2014 1111     Impression and Plan: Mr. Aldis is 75 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 He is s/p Vidaza cycle 2. He received 2 units of blood and Aranesp earlier this month. Unfortunately, he becomes fatigued after completing his Decadron with each cycle. He is also still having issues with balance due to the fatigue.   His Hgb today is 8.6 with an MCV of 104. Platelets are 56. No episodes of bleeding or bruising.  We will have him start taking Prednisone 20 mg daily and see if this helps improve his energy.  I also gave him a prescription for a cane.   We will hold off on transfusing him at this time. He comes back in next Tuesday for lab work and we will see how everything looks at that time.  Dr. Marin Olp with repeat a bone marrow test once he has completed 4 cycles.  He has his current treatment/appoinment schedule.  He will contact us with any questions or  concerns. We can certainly see him sooner if need be.   Eliezer Bottom, NP 2/21/201711:08 AM

## 2015-08-03 ENCOUNTER — Encounter: Payer: Self-pay | Admitting: Hematology & Oncology

## 2015-08-04 ENCOUNTER — Encounter: Payer: Self-pay | Admitting: Hematology & Oncology

## 2015-08-05 ENCOUNTER — Encounter: Payer: Self-pay | Admitting: Hematology & Oncology

## 2015-08-08 ENCOUNTER — Other Ambulatory Visit: Payer: Self-pay | Admitting: *Deleted

## 2015-08-08 DIAGNOSIS — D4620 Refractory anemia with excess of blasts, unspecified: Secondary | ICD-10-CM

## 2015-08-09 ENCOUNTER — Ambulatory Visit (HOSPITAL_BASED_OUTPATIENT_CLINIC_OR_DEPARTMENT_OTHER): Payer: Medicare Other

## 2015-08-09 ENCOUNTER — Other Ambulatory Visit: Payer: Self-pay | Admitting: Family

## 2015-08-09 ENCOUNTER — Other Ambulatory Visit (HOSPITAL_BASED_OUTPATIENT_CLINIC_OR_DEPARTMENT_OTHER): Payer: Medicare Other

## 2015-08-09 VITALS — BP 144/60 | HR 94 | Temp 98.1°F | Resp 18

## 2015-08-09 DIAGNOSIS — D4620 Refractory anemia with excess of blasts, unspecified: Secondary | ICD-10-CM

## 2015-08-09 DIAGNOSIS — Z452 Encounter for adjustment and management of vascular access device: Secondary | ICD-10-CM

## 2015-08-09 DIAGNOSIS — D649 Anemia, unspecified: Secondary | ICD-10-CM

## 2015-08-09 DIAGNOSIS — D469 Myelodysplastic syndrome, unspecified: Secondary | ICD-10-CM

## 2015-08-09 DIAGNOSIS — D46Z Other myelodysplastic syndromes: Secondary | ICD-10-CM

## 2015-08-09 LAB — COMPREHENSIVE METABOLIC PANEL
ALBUMIN: 3.3 g/dL — AB (ref 3.5–5.0)
ALK PHOS: 55 U/L (ref 40–150)
ALT: 12 U/L (ref 0–55)
AST: 15 U/L (ref 5–34)
Anion Gap: 10 mEq/L (ref 3–11)
BILIRUBIN TOTAL: 0.37 mg/dL (ref 0.20–1.20)
BUN: 30.5 mg/dL — ABNORMAL HIGH (ref 7.0–26.0)
CO2: 23 meq/L (ref 22–29)
CREATININE: 1.6 mg/dL — AB (ref 0.7–1.3)
Calcium: 9.6 mg/dL (ref 8.4–10.4)
Chloride: 103 mEq/L (ref 98–109)
EGFR: 40 mL/min/{1.73_m2} — AB (ref >90)
GLUCOSE: 101 mg/dL (ref 70–140)
Potassium: 4.4 mEq/L (ref 3.5–5.1)
SODIUM: 136 meq/L (ref 136–145)
TOTAL PROTEIN: 7.4 g/dL (ref 6.4–8.3)

## 2015-08-09 LAB — CBC WITH DIFFERENTIAL (CANCER CENTER ONLY)
BASO#: 0 10*3/uL (ref 0.0–0.2)
BASO%: 1.3 % (ref 0.0–2.0)
EOS ABS: 0 10*3/uL (ref 0.0–0.5)
EOS%: 1.3 % (ref 0.0–7.0)
HCT: 24.9 % — ABNORMAL LOW (ref 38.7–49.9)
HEMOGLOBIN: 8.3 g/dL — AB (ref 13.0–17.1)
LYMPH#: 0.5 10*3/uL — ABNORMAL LOW (ref 0.9–3.3)
LYMPH%: 30 % (ref 14.0–48.0)
MCH: 33.7 pg — AB (ref 28.0–33.4)
MCHC: 33.3 g/dL (ref 32.0–35.9)
MCV: 101 fL — AB (ref 82–98)
MONO#: 0.3 10*3/uL (ref 0.1–0.9)
MONO%: 20 % — AB (ref 0.0–13.0)
NEUT%: 47.4 % (ref 40.0–80.0)
NEUTROS ABS: 0.8 10*3/uL — AB (ref 1.5–6.5)
PLATELETS: 144 10*3/uL — AB (ref 145–400)
RBC: 2.46 10*6/uL — AB (ref 4.20–5.70)
RDW: 19.7 % — ABNORMAL HIGH (ref 11.1–15.7)
WBC: 1.6 10*3/uL — AB (ref 4.0–10.0)

## 2015-08-09 LAB — TECHNOLOGIST REVIEW CHCC SATELLITE

## 2015-08-09 LAB — HOLD TUBE, BLOOD BANK - CHCC SATELLITE

## 2015-08-09 MED ORDER — HEPARIN SOD (PORK) LOCK FLUSH 100 UNIT/ML IV SOLN
500.0000 [IU] | Freq: Once | INTRAVENOUS | Status: AC
  Administered 2015-08-09: 500 [IU] via INTRAVENOUS
  Filled 2015-08-09: qty 5

## 2015-08-09 MED ORDER — SODIUM CHLORIDE 0.9% FLUSH
10.0000 mL | INTRAVENOUS | Status: DC | PRN
  Administered 2015-08-09: 10 mL via INTRAVENOUS
  Filled 2015-08-09: qty 10

## 2015-08-09 NOTE — Patient Instructions (Signed)

## 2015-08-10 ENCOUNTER — Ambulatory Visit (HOSPITAL_COMMUNITY)
Admission: RE | Admit: 2015-08-10 | Discharge: 2015-08-10 | Disposition: A | Discharge status: Home / Self Care | Payer: Medicare Other | Source: Ambulatory Visit | Attending: Hematology & Oncology | Admitting: Hematology & Oncology

## 2015-08-10 ENCOUNTER — Ambulatory Visit (HOSPITAL_BASED_OUTPATIENT_CLINIC_OR_DEPARTMENT_OTHER): Payer: Medicare Other

## 2015-08-10 VITALS — BP 136/57 | HR 86 | Temp 97.5°F | Resp 18

## 2015-08-10 DIAGNOSIS — D4620 Refractory anemia with excess of blasts, unspecified: Secondary | ICD-10-CM | POA: Insufficient documentation

## 2015-08-10 DIAGNOSIS — D649 Anemia, unspecified: Secondary | ICD-10-CM

## 2015-08-10 DIAGNOSIS — D509 Iron deficiency anemia, unspecified: Secondary | ICD-10-CM | POA: Diagnosis not present

## 2015-08-10 LAB — PREPARE RBC (CROSSMATCH)

## 2015-08-10 MED ORDER — DIPHENHYDRAMINE HCL 25 MG PO CAPS
ORAL_CAPSULE | ORAL | Status: AC
  Filled 2015-08-10: qty 1

## 2015-08-10 MED ORDER — FUROSEMIDE 10 MG/ML IJ SOLN: 20.0000 mg | Freq: Once | INTRAMUSCULAR | Status: DC

## 2015-08-10 MED ORDER — DIPHENHYDRAMINE HCL 25 MG PO CAPS
25.0000 mg | ORAL_CAPSULE | Freq: Once | ORAL | Status: AC
  Administered 2015-08-10: 25 mg via ORAL

## 2015-08-10 MED ORDER — ACETAMINOPHEN 325 MG PO TABS
650.0000 mg | ORAL_TABLET | Freq: Once | ORAL | Status: AC
  Administered 2015-08-10: 650 mg via ORAL

## 2015-08-10 MED ORDER — ACETAMINOPHEN 325 MG PO TABS
ORAL_TABLET | ORAL | Status: AC
  Filled 2015-08-10: qty 2

## 2015-08-10 MED ORDER — HEPARIN SOD (PORK) LOCK FLUSH 100 UNIT/ML IV SOLN
500.0000 [IU] | Freq: Every day | INTRAVENOUS | Status: AC | PRN
  Administered 2015-08-10: 500 [IU]
  Filled 2015-08-10: qty 5

## 2015-08-10 MED ORDER — SODIUM CHLORIDE 0.9 % IV SOLN
250.0000 mL | Freq: Once | INTRAVENOUS | Status: AC
  Administered 2015-08-10: 250 mL via INTRAVENOUS

## 2015-08-10 MED ORDER — SODIUM CHLORIDE 0.9% FLUSH
10.0000 mL | INTRAVENOUS | Status: AC | PRN
  Administered 2015-08-10: 10 mL
  Filled 2015-08-10: qty 10

## 2015-08-10 NOTE — Patient Instructions (Signed)

## 2015-08-11 ENCOUNTER — Encounter: Payer: Self-pay | Admitting: Hematology & Oncology

## 2015-08-11 LAB — TYPE AND SCREEN
ABO/RH(D): O POS
ANTIBODY SCREEN: NEGATIVE
Unit division: 0
Unit division: 0

## 2015-08-12 ENCOUNTER — Other Ambulatory Visit: Payer: Self-pay | Admitting: *Deleted

## 2015-08-12 DIAGNOSIS — D4620 Refractory anemia with excess of blasts, unspecified: Secondary | ICD-10-CM

## 2015-08-15 ENCOUNTER — Encounter: Payer: Self-pay | Admitting: Hematology & Oncology

## 2015-08-15 ENCOUNTER — Ambulatory Visit (HOSPITAL_BASED_OUTPATIENT_CLINIC_OR_DEPARTMENT_OTHER): Payer: Medicare Other

## 2015-08-15 ENCOUNTER — Ambulatory Visit (HOSPITAL_BASED_OUTPATIENT_CLINIC_OR_DEPARTMENT_OTHER): Payer: Medicare Other | Admitting: Hematology & Oncology

## 2015-08-15 ENCOUNTER — Other Ambulatory Visit (HOSPITAL_BASED_OUTPATIENT_CLINIC_OR_DEPARTMENT_OTHER): Payer: Medicare Other

## 2015-08-15 VITALS — BP 148/71 | HR 95 | Temp 98.2°F | Resp 18 | Wt 211.0 lb

## 2015-08-15 DIAGNOSIS — C9200 Acute myeloblastic leukemia, not having achieved remission: Secondary | ICD-10-CM

## 2015-08-15 DIAGNOSIS — Z5111 Encounter for antineoplastic chemotherapy: Secondary | ICD-10-CM | POA: Diagnosis not present

## 2015-08-15 DIAGNOSIS — D4620 Refractory anemia with excess of blasts, unspecified: Secondary | ICD-10-CM

## 2015-08-15 DIAGNOSIS — D4621 Refractory anemia with excess of blasts 1: Secondary | ICD-10-CM

## 2015-08-15 LAB — CMP (CANCER CENTER ONLY)
ALBUMIN: 3.3 g/dL (ref 3.3–5.5)
ALT(SGPT): 21 U/L (ref 10–47)
AST: 21 U/L (ref 11–38)
Alkaline Phosphatase: 52 U/L (ref 26–84)
BILIRUBIN TOTAL: 0.6 mg/dL (ref 0.20–1.60)
BUN, Bld: 26 mg/dL — ABNORMAL HIGH (ref 7–22)
CALCIUM: 8.9 mg/dL (ref 8.0–10.3)
CO2: 26 meq/L (ref 18–33)
CREATININE: 1.5 mg/dL — AB (ref 0.6–1.2)
Chloride: 100 mEq/L (ref 98–108)
GLUCOSE: 118 mg/dL (ref 73–118)
Potassium: 4.1 mEq/L (ref 3.3–4.7)
SODIUM: 137 meq/L (ref 128–145)
TOTAL PROTEIN: 7.5 g/dL (ref 6.4–8.1)

## 2015-08-15 LAB — CBC WITH DIFFERENTIAL (CANCER CENTER ONLY)
BASO#: 0 10*3/uL (ref 0.0–0.2)
BASO%: 2.4 % — ABNORMAL HIGH (ref 0.0–2.0)
EOS ABS: 0 10*3/uL (ref 0.0–0.5)
EOS%: 1.6 % (ref 0.0–7.0)
HEMATOCRIT: 30.8 % — AB (ref 38.7–49.9)
HEMOGLOBIN: 10.4 g/dL — AB (ref 13.0–17.1)
LYMPH#: 0.3 10*3/uL — AB (ref 0.9–3.3)
LYMPH%: 24.4 % (ref 14.0–48.0)
MCH: 33.2 pg (ref 28.0–33.4)
MCHC: 33.8 g/dL (ref 32.0–35.9)
MCV: 98 fL (ref 82–98)
MONO#: 0.2 10*3/uL (ref 0.1–0.9)
MONO%: 13.4 % — AB (ref 0.0–13.0)
NEUT%: 58.2 % (ref 40.0–80.0)
NEUTROS ABS: 0.7 10*3/uL — AB (ref 1.5–6.5)
Platelets: 154 10*3/uL (ref 145–400)
RBC: 3.13 10*6/uL — AB (ref 4.20–5.70)
RDW: 19.6 % — ABNORMAL HIGH (ref 11.1–15.7)
WBC: 1.3 10*3/uL — AB (ref 4.0–10.0)

## 2015-08-15 LAB — TECHNOLOGIST REVIEW CHCC SATELLITE

## 2015-08-15 MED ORDER — HEPARIN SOD (PORK) LOCK FLUSH 100 UNIT/ML IV SOLN
500.0000 [IU] | Freq: Once | INTRAVENOUS | Status: AC | PRN
  Administered 2015-08-15: 500 [IU]
  Filled 2015-08-15: qty 5

## 2015-08-15 MED ORDER — SODIUM CHLORIDE 0.9 % IJ SOLN
10.0000 mL | INTRAMUSCULAR | Status: DC | PRN
Start: 2015-08-15 — End: 2015-08-15
  Administered 2015-08-15: 10 mL
  Filled 2015-08-15: qty 10

## 2015-08-15 MED ORDER — SODIUM CHLORIDE 0.9 % IV SOLN
Freq: Once | INTRAVENOUS | Status: AC
  Administered 2015-08-15: 15:00:00 via INTRAVENOUS

## 2015-08-15 MED ORDER — SODIUM CHLORIDE 0.9 % IV SOLN
Freq: Once | INTRAVENOUS | Status: AC
  Administered 2015-08-15: 15:00:00 via INTRAVENOUS
  Filled 2015-08-15: qty 4

## 2015-08-15 MED ORDER — LACTATED RINGERS IV SOLN
75.0000 mg/m2 | Freq: Every day | INTRAVENOUS | Status: DC
  Administered 2015-08-15: 170 mg via INTRAVENOUS
  Filled 2015-08-15: qty 34

## 2015-08-15 NOTE — Patient Instructions (Signed)
[facility] Discharge Instructions for Patients Receiving Chemotherapy  Today you received the following chemotherapy agents Vidaza  To help prevent nausea and vomiting after your treatment, we encourage you to take your nausea medication   If you develop nausea and vomiting that is not controlled by your nausea medication, call the clinic.   BELOW ARE SYMPTOMS THAT SHOULD BE REPORTED IMMEDIATELY:  *FEVER GREATER THAN 100.5 F  *CHILLS WITH OR WITHOUT FEVER  NAUSEA AND VOMITING THAT IS NOT CONTROLLED WITH YOUR NAUSEA MEDICATION  *UNUSUAL SHORTNESS OF BREATH  *UNUSUAL BRUISING OR BLEEDING  TENDERNESS IN MOUTH AND THROAT WITH OR WITHOUT PRESENCE OF ULCERS  *URINARY PROBLEMS  *BOWEL PROBLEMS  UNUSUAL RASH Items with * indicate a potential emergency and should be followed up as soon as possible.  Feel free to call the clinic you have any questions or concerns. The clinic phone number is [phone].  Please show the CHEMO ALERT CARD at check-in to the Emergency Department and triage nurse.   

## 2015-08-15 NOTE — Progress Notes (Signed)
Hematology and Oncology Follow Up Visit  Anthony [name] [id number] 1941-03-01 75 y.o. 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 He looks a little bit better today. I then we'll probably transfuse him a week or so ago.  He was does better when with [facility] wins in football or basketball basketball.  His appetite is doing okay. He's had no nausea or vomiting. He is not as tired.  He's had no bleeding. He's had no fever. He's had no cough. He's had no nausea or vomiting.  He's had no leg swelling. He's had no joint problems. He has had some balance issues. He does use a cane to help get around.  Overall, his performance status is ECOG 1.    Medications:  Current outpatient prescriptions:  .  Albuterol Sulfate (PROAIR RESPICLICK) 123XX123 (90 BASE) MCG/ACT AEPB, Inhale 2 puffs into the lungs every 6 (six) hours as needed., Disp: , Rfl:  .  bimatoprost (LUMIGAN) 0.01 % SOLN, Place 1 drop into both eyes at bedtime., Disp: , Rfl:  .  budesonide-formoterol (SYMBICORT) 160-4.5 MCG/ACT inhaler, Inhale 2 puffs into the lungs 2 (two) times daily., Disp: 3 Inhaler, Rfl: 3 .  buPROPion (WELLBUTRIN XL) 150 MG 24 hr tablet, Take 150 mg by mouth daily., Disp: , Rfl:  .  Cholecalciferol (VITAMIN D3) 5000 UNITS TABS, Take by mouth daily., Disp: , Rfl:  .  clopidogrel (PLAVIX) 75 MG tablet, Take 75 mg by mouth daily. , Disp: , Rfl:  .  co-enzyme Q-10 30 MG capsule, Take 100 mg by mouth daily., Disp: , Rfl:  .  Cyanocobalamin (VITAMIN B-12) 2500 MCG TABS, Take 5,000 mcg by mouth daily., Disp: 90 tablet, Rfl: 9 .  fish oil-omega-3 fatty acids 1000 MG capsule, Take 1 g by mouth daily., Disp: , Rfl:  .  Flaxseed, Linseed, (FLAX SEED OIL) 1000 MG CAPS, Take 1,000 mg by  mouth daily., Disp: , Rfl:  .  LORazepam (ATIVAN) 0.5 MG tablet, Take 1 tablet (0.5 mg total) by mouth every 6 (six) hours as needed (Nausea or vomiting)., Disp: 30 tablet, Rfl: 0 .  losartan (COZAAR) 50 MG tablet, 25 mg daily., Disp: , Rfl:  .  ondansetron (ZOFRAN) 8 MG tablet, Take 1 tablet (8 mg total) by mouth 2 (two) times daily. Start the day after chemo for 2 days. Then take as needed for nausea or vomiting., Disp: 30 tablet, Rfl: 1 .  pantoprazole (PROTONIX) 40 MG tablet, Take 40 mg by mouth daily., Disp: , Rfl:  .  predniSONE (DELTASONE) 20 MG tablet, Take 1 tablet (20 mg total) by mouth daily with breakfast., Disp: 30 tablet, Rfl: 2 .  prochlorperazine (COMPAZINE) 10 MG tablet, Take 1 tablet (10 mg total) by mouth every 6 (six) hours as needed (Nausea or vomiting)., Disp: 30 tablet, Rfl: 1 .  ranitidine (ZANTAC) 300 MG tablet, Take 300 mg by mouth at bedtime. , Disp: , Rfl:  .  sertraline (ZOLOFT) 100 MG tablet, Take 100 mg by mouth daily., Disp: , Rfl: 1 .  SIMBRINZA 1-0.2 % SUSP, , Disp: , Rfl:  .  simvastatin (ZOCOR) 40 MG tablet, Take 40 mg by mouth every other day. , Disp: , Rfl:  .  tiotropium (  SPIRIVA) 18 MCG inhalation capsule, Place 18 mcg into inhaler and inhale daily., Disp: , Rfl:  No current facility-administered medications for this visit.  Facility-Administered Medications Ordered in Other Visits:  .  azaCITIDine (VIDAZA) 170 mg in lactated ringers 51 mL chemo infusion, 75 mg/m2 (Treatment Plan Actual), Intravenous, Daily, Anthony Napoleon, MD, Stopped at 08/15/15 1650 .  sodium chloride 0.9 % injection 10 mL, 10 mL, Intracatheter, PRN, Anthony Napoleon, MD, 10 mL at 08/15/15 1657  Allergies: No Known Allergies  Past Medical History, Surgical history, Social history, and Family History were reviewed and updated.  Review of Systems: As above  Physical Exam:  weight is 211 lb (95.709 kg). His oral temperature is 98.2 F (36.8 C). His blood pressure is 148/71 and his  pulse is 95. His respiration is 18 and oxygen saturation is 99%.   Well-developed and well-nourished white general. Head and neck exam shows no ocular or oral lesions. He has no scleral icterus. He has no adenopathy in the neck. Lungs are clear. Cardiac exam regular rate and rhythm with no murmurs, rubs or bruits. Abdomen is soft. He has good bowel sounds. He's mildly obese. He has no fluid wave. There is no palpable liver or spleen tip. Back exam shows no tenderness over the spine, ribs or hips. Extremities shows no clubbing, cyanosis or edema. Skin exam no rashes, ecchymoses or petechia.  Lab Results  Component Value Date   WBC 1.3* 08/15/2015   HGB 10.4* 08/15/2015   HCT 30.8* 08/15/2015   MCV 98 08/15/2015   PLT 154 08/15/2015     Chemistry      Component Value Date/Time   NA 137 08/15/2015 1321   NA 136 08/09/2015 1158   NA 141 06/17/2015 1155   K 4.1 08/15/2015 1321   K 4.4 08/09/2015 1158   K 4.4 06/17/2015 1155   CL 100 08/15/2015 1321   CL 107 06/17/2015 1155   CO2 26 08/15/2015 1321   CO2 23 08/09/2015 1158   CO2 25 06/17/2015 1155   BUN 26* 08/15/2015 1321   BUN 30.5* 08/09/2015 1158   BUN 27* 06/17/2015 1155   CREATININE 1.5* 08/15/2015 1321   CREATININE 1.6* 08/09/2015 1158   CREATININE 1.48* 06/17/2015 1155      Component Value Date/Time   CALCIUM 8.9 08/15/2015 1321   CALCIUM 9.6 08/09/2015 1158   CALCIUM 9.0 06/17/2015 1155   ALKPHOS 52 08/15/2015 1321   ALKPHOS 55 08/09/2015 1158   ALKPHOS 36* 10/26/2014 1111   AST 21 08/15/2015 1321   AST 15 08/09/2015 1158   AST 19 10/26/2014 1111   ALT 21 08/15/2015 1321   ALT 12 08/09/2015 1158   ALT 15 10/26/2014 1111   BILITOT 0.60 08/15/2015 1321   BILITOT 0.37 08/09/2015 1158   BILITOT 0.4 10/26/2014 1111         Impression and Plan: Anthony [name] is 75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   This will be His third cycle treatment. It is hard to know what type of response we will get. However, I have  noted that the monocyte percentage has been coming down slowly. Hopefully, this will be a good indicator of response.   We check his blood count weekly.  He will get Aranesp.  After his fourth cycle treatment, we will repeat a bone marrow test on him.  This might be one of the situation where we just have to be patient and just keep treating him and hopefully find that his blood  counts will improve.  I will plan to get back to see me in another month.     Anthony Napoleon, MD 3/6/20175:46 PM

## 2015-08-16 ENCOUNTER — Other Ambulatory Visit: Payer: Medicare Other

## 2015-08-16 ENCOUNTER — Ambulatory Visit (HOSPITAL_BASED_OUTPATIENT_CLINIC_OR_DEPARTMENT_OTHER): Payer: Medicare Other

## 2015-08-16 VITALS — BP 133/63 | HR 86 | Resp 18

## 2015-08-16 DIAGNOSIS — D4621 Refractory anemia with excess of blasts 1: Secondary | ICD-10-CM

## 2015-08-16 DIAGNOSIS — Z5111 Encounter for antineoplastic chemotherapy: Secondary | ICD-10-CM

## 2015-08-16 DIAGNOSIS — D4620 Refractory anemia with excess of blasts, unspecified: Secondary | ICD-10-CM

## 2015-08-16 MED ORDER — SODIUM CHLORIDE 0.9 % IJ SOLN
10.0000 mL | INTRAMUSCULAR | Status: DC | PRN
  Administered 2015-08-16: 10 mL
  Filled 2015-08-16: qty 10

## 2015-08-16 MED ORDER — LACTATED RINGERS IV SOLN
75.0000 mg/m2 | Freq: Every day | INTRAVENOUS | Status: DC
  Administered 2015-08-16: 170 mg via INTRAVENOUS
  Filled 2015-08-16: qty 34

## 2015-08-16 MED ORDER — HEPARIN SOD (PORK) LOCK FLUSH 100 UNIT/ML IV SOLN
500.0000 [IU] | Freq: Once | INTRAVENOUS | Status: AC | PRN
  Administered 2015-08-16: 500 [IU]
  Filled 2015-08-16: qty 5

## 2015-08-16 MED ORDER — ALTEPLASE 2 MG IJ SOLR
2.0000 mg | Freq: Once | INTRAMUSCULAR | Status: DC | PRN
  Filled 2015-08-16: qty 2

## 2015-08-16 MED ORDER — HEPARIN SOD (PORK) LOCK FLUSH 100 UNIT/ML IV SOLN
250.0000 [IU] | Freq: Once | INTRAVENOUS | Status: DC | PRN
  Filled 2015-08-16: qty 5

## 2015-08-16 MED ORDER — SODIUM CHLORIDE 0.9 % IV SOLN
Freq: Once | INTRAVENOUS | Status: AC
  Administered 2015-08-16: 09:00:00 via INTRAVENOUS

## 2015-08-16 MED ORDER — SODIUM CHLORIDE 0.9 % IV SOLN
Freq: Once | INTRAVENOUS | Status: AC
  Administered 2015-08-16: 09:00:00 via INTRAVENOUS
  Filled 2015-08-16: qty 4

## 2015-08-16 MED ORDER — SODIUM CHLORIDE 0.9 % IJ SOLN
3.0000 mL | INTRAMUSCULAR | Status: DC | PRN
  Filled 2015-08-16: qty 10

## 2015-08-16 NOTE — Patient Instructions (Signed)
Azacitidine suspension for injection (subcutaneous use) What is this medicine? AZACITIDINE (ay za SITE i deen) is a chemotherapy drug. This medicine reduces the growth of cancer cells and can suppress the immune system. It is used for treating myelodysplastic syndrome or some types of leukemia. This medicine may be used for other purposes; ask your health care provider or pharmacist if you have questions. What should I tell my health care provider before I take this medicine? They need to know if you have any of these conditions: -infection (especially a virus infection such as chickenpox, cold sores, or herpes) -kidney disease -liver disease -liver tumors -an unusual or allergic reaction to azacitidine, mannitol, other medicines, foods, dyes, or preservatives -pregnant or trying to get pregnant -breast-feeding How should I use this medicine? This medicine is for injection under the skin. It is administered in a hospital or clinic by a specially trained health care professional. Talk to your pediatrician regarding the use of this medicine in children. While this drug may be prescribed for selected conditions, precautions do apply. Overdosage: If you think you have taken too much of this medicine contact a poison control center or emergency room at once. NOTE: This medicine is only for you. Do not share this medicine with others. What if I miss a dose? It is important not to miss your dose. Call your doctor or health care professional if you are unable to keep an appointment. What may interact with this medicine? Interactions have not been studied. Give your health care provider a list of all the medicines, herbs, non-prescription drugs, or dietary supplements you use. Also tell them if you smoke, drink alcohol, or use illegal drugs. Some items may interact with your medicine. This list may not describe all possible interactions. Give your health care provider a list of all the medicines,  herbs, non-prescription drugs, or dietary supplements you use. Also tell them if you smoke, drink alcohol, or use illegal drugs. Some items may interact with your medicine. What should I watch for while using this medicine? Visit your doctor for checks on your progress. This drug may make you feel generally unwell. This is not uncommon, as chemotherapy can affect healthy cells as well as cancer cells. Report any side effects. Continue your course of treatment even though you feel ill unless your doctor tells you to stop. In some cases, you may be given additional medicines to help with side effects. Follow all directions for their use. Call your doctor or health care professional for advice if you get a fever, chills or sore throat, or other symptoms of a cold or flu. Do not treat yourself. This drug decreases your body's ability to fight infections. Try to avoid being around people who are sick. This medicine may increase your risk to bruise or bleed. Call your doctor or health care professional if you notice any unusual bleeding. Do not have any vaccinations without your doctor's approval and avoid anyone who has recently had oral polio vaccine. Do not become pregnant while taking this medicine. Women should inform their doctor if they wish to become pregnant or think they might be pregnant. There is a potential for serious side effects to an unborn child. Talk to your health care professional or pharmacist for more information. Do not breast-feed an infant while taking this medicine. If you are a man, you should not father a child while receiving treatment. What side effects may I notice from receiving this medicine? Side effects that you should report   to your doctor or health care professional as soon as possible: -allergic reactions like skin rash, itching or hives, swelling of the face, lips, or tongue -low blood counts - this medicine may decrease the number of white blood cells, red blood cells  and platelets. You may be at increased risk for infections and bleeding. -signs of infection - fever or chills, cough, sore throat, pain or difficulty passing urine -signs of decreased platelets or bleeding - bruising, pinpoint red spots on the skin, black, tarry stools, blood in the urine -signs of decreased red blood cells - unusually weak or tired, fainting spells, lightheadedness -reactions at the injection site including redness, pain, itching, or bruising -breathing problems -changes in vision -fever -mouth sores -stomach pain -vomiting Side effects that usually do not require medical attention (report to your doctor or health care professional if they continue or are bothersome): -constipation -diarrhea -loss of appetite -nausea -pain or redness at the injection site -weak or tired This list may not describe all possible side effects. Call your doctor for medical advice about side effects. You may report side effects to FDA at 1-800-FDA-1088. Where should I keep my medicine? This drug is given in a hospital or clinic and will not be stored at home. NOTE: This sheet is a summary. It may not cover all possible information. If you have questions about this medicine, talk to your doctor, pharmacist, or health care provider.    2016, [employer]. (2014-02-19 18:13:53)  

## 2015-08-17 ENCOUNTER — Ambulatory Visit (HOSPITAL_BASED_OUTPATIENT_CLINIC_OR_DEPARTMENT_OTHER): Payer: Medicare Other

## 2015-08-17 VITALS — BP 116/63 | HR 90 | Temp 97.6°F | Resp 20

## 2015-08-17 DIAGNOSIS — Z5111 Encounter for antineoplastic chemotherapy: Secondary | ICD-10-CM | POA: Diagnosis not present

## 2015-08-17 DIAGNOSIS — D4620 Refractory anemia with excess of blasts, unspecified: Secondary | ICD-10-CM

## 2015-08-17 DIAGNOSIS — D4621 Refractory anemia with excess of blasts 1: Secondary | ICD-10-CM | POA: Diagnosis not present

## 2015-08-17 MED ORDER — LACTATED RINGERS IV SOLN
75.0000 mg/m2 | Freq: Every day | INTRAVENOUS | Status: DC
  Administered 2015-08-17: 170 mg via INTRAVENOUS
  Filled 2015-08-17: qty 34

## 2015-08-17 MED ORDER — SODIUM CHLORIDE 0.9 % IV SOLN
Freq: Once | INTRAVENOUS | Status: AC
  Administered 2015-08-17: 08:00:00 via INTRAVENOUS

## 2015-08-17 MED ORDER — SODIUM CHLORIDE 0.9 % IV SOLN
Freq: Once | INTRAVENOUS | Status: AC
  Administered 2015-08-17: 09:00:00 via INTRAVENOUS
  Filled 2015-08-17: qty 4

## 2015-08-17 MED ORDER — HEPARIN SOD (PORK) LOCK FLUSH 100 UNIT/ML IV SOLN
500.0000 [IU] | Freq: Once | INTRAVENOUS | Status: AC | PRN
  Administered 2015-08-17: 500 [IU]
  Filled 2015-08-17: qty 5

## 2015-08-17 MED ORDER — SODIUM CHLORIDE 0.9 % IJ SOLN
10.0000 mL | INTRAMUSCULAR | Status: DC | PRN
  Administered 2015-08-17: 10 mL
  Filled 2015-08-17: qty 10

## 2015-08-17 NOTE — Patient Instructions (Signed)
Azacitidine suspension for injection (subcutaneous use) What is this medicine? AZACITIDINE (ay za SITE i deen) is a chemotherapy drug. This medicine reduces the growth of cancer cells and can suppress the immune system. It is used for treating myelodysplastic syndrome or some types of leukemia. This medicine may be used for other purposes; ask your health care provider or pharmacist if you have questions. What should I tell my health care provider before I take this medicine? They need to know if you have any of these conditions: -infection (especially a virus infection such as chickenpox, cold sores, or herpes) -kidney disease -liver disease -liver tumors -an unusual or allergic reaction to azacitidine, mannitol, other medicines, foods, dyes, or preservatives -pregnant or trying to get pregnant -breast-feeding How should I use this medicine? This medicine is for injection under the skin. It is administered in a hospital or clinic by a specially trained health care professional. Talk to your pediatrician regarding the use of this medicine in children. While this drug may be prescribed for selected conditions, precautions do apply. Overdosage: If you think you have taken too much of this medicine contact a poison control center or emergency room at once. NOTE: This medicine is only for you. Do not share this medicine with others. What if I miss a dose? It is important not to miss your dose. Call your doctor or health care professional if you are unable to keep an appointment. What may interact with this medicine? Interactions have not been studied. Give your health care provider a list of all the medicines, herbs, non-prescription drugs, or dietary supplements you use. Also tell them if you smoke, drink alcohol, or use illegal drugs. Some items may interact with your medicine. This list may not describe all possible interactions. Give your health care provider a list of all the medicines,  herbs, non-prescription drugs, or dietary supplements you use. Also tell them if you smoke, drink alcohol, or use illegal drugs. Some items may interact with your medicine. What should I watch for while using this medicine? Visit your doctor for checks on your progress. This drug may make you feel generally unwell. This is not uncommon, as chemotherapy can affect healthy cells as well as cancer cells. Report any side effects. Continue your course of treatment even though you feel ill unless your doctor tells you to stop. In some cases, you may be given additional medicines to help with side effects. Follow all directions for their use. Call your doctor or health care professional for advice if you get a fever, chills or sore throat, or other symptoms of a cold or flu. Do not treat yourself. This drug decreases your body's ability to fight infections. Try to avoid being around people who are sick. This medicine may increase your risk to bruise or bleed. Call your doctor or health care professional if you notice any unusual bleeding. Do not have any vaccinations without your doctor's approval and avoid anyone who has recently had oral polio vaccine. Do not become pregnant while taking this medicine. Women should inform their doctor if they wish to become pregnant or think they might be pregnant. There is a potential for serious side effects to an unborn child. Talk to your health care professional or pharmacist for more information. Do not breast-feed an infant while taking this medicine. If you are a man, you should not father a child while receiving treatment. What side effects may I notice from receiving this medicine? Side effects that you should report   to your doctor or health care professional as soon as possible: -allergic reactions like skin rash, itching or hives, swelling of the face, lips, or tongue -low blood counts - this medicine may decrease the number of white blood cells, red blood cells  and platelets. You may be at increased risk for infections and bleeding. -signs of infection - fever or chills, cough, sore throat, pain or difficulty passing urine -signs of decreased platelets or bleeding - bruising, pinpoint red spots on the skin, black, tarry stools, blood in the urine -signs of decreased red blood cells - unusually weak or tired, fainting spells, lightheadedness -reactions at the injection site including redness, pain, itching, or bruising -breathing problems -changes in vision -fever -mouth sores -stomach pain -vomiting Side effects that usually do not require medical attention (report to your doctor or health care professional if they continue or are bothersome): -constipation -diarrhea -loss of appetite -nausea -pain or redness at the injection site -weak or tired This list may not describe all possible side effects. Call your doctor for medical advice about side effects. You may report side effects to FDA at 1-800-FDA-1088. Where should I keep my medicine? This drug is given in a hospital or clinic and will not be stored at home. NOTE: This sheet is a summary. It may not cover all possible information. If you have questions about this medicine, talk to your doctor, pharmacist, or health care provider.    2016, [employer]. (2014-02-19 18:13:53)  

## 2015-08-18 ENCOUNTER — Ambulatory Visit (HOSPITAL_BASED_OUTPATIENT_CLINIC_OR_DEPARTMENT_OTHER): Payer: Medicare Other

## 2015-08-18 VITALS — BP 126/58 | HR 87 | Resp 20

## 2015-08-18 DIAGNOSIS — D4621 Refractory anemia with excess of blasts 1: Secondary | ICD-10-CM

## 2015-08-18 DIAGNOSIS — D4620 Refractory anemia with excess of blasts, unspecified: Secondary | ICD-10-CM

## 2015-08-18 DIAGNOSIS — Z5111 Encounter for antineoplastic chemotherapy: Secondary | ICD-10-CM

## 2015-08-18 MED ORDER — AZACITIDINE CHEMO INJECTION 100 MG
75.0000 mg/m2 | Freq: Every day | INTRAMUSCULAR | Status: DC
  Administered 2015-08-18: 170 mg via INTRAVENOUS
  Filled 2015-08-18: qty 34

## 2015-08-18 MED ORDER — SODIUM CHLORIDE 0.9 % IV SOLN
Freq: Once | INTRAVENOUS | Status: AC
  Administered 2015-08-18: 08:00:00 via INTRAVENOUS
  Filled 2015-08-18: qty 4

## 2015-08-18 MED ORDER — SODIUM CHLORIDE 0.9 % IJ SOLN
10.0000 mL | INTRAMUSCULAR | Status: DC | PRN
  Administered 2015-08-18: 10 mL
  Filled 2015-08-18: qty 10

## 2015-08-18 MED ORDER — HEPARIN SOD (PORK) LOCK FLUSH 100 UNIT/ML IV SOLN
500.0000 [IU] | Freq: Once | INTRAVENOUS | Status: AC | PRN
  Administered 2015-08-18: 500 [IU]
  Filled 2015-08-18: qty 5

## 2015-08-18 MED ORDER — SODIUM CHLORIDE 0.9 % IV SOLN
Freq: Once | INTRAVENOUS | Status: AC
  Administered 2015-08-18: 08:00:00 via INTRAVENOUS

## 2015-08-18 NOTE — Patient Instructions (Signed)
Azacitidine suspension for injection (subcutaneous use) What is this medicine? AZACITIDINE (ay za SITE i deen) is a chemotherapy drug. This medicine reduces the growth of cancer cells and can suppress the immune system. It is used for treating myelodysplastic syndrome or some types of leukemia. This medicine may be used for other purposes; ask your health care provider or pharmacist if you have questions. What should I tell my health care provider before I take this medicine? They need to know if you have any of these conditions: -infection (especially a virus infection such as chickenpox, cold sores, or herpes) -kidney disease -liver disease -liver tumors -an unusual or allergic reaction to azacitidine, mannitol, other medicines, foods, dyes, or preservatives -pregnant or trying to get pregnant -breast-feeding How should I use this medicine? This medicine is for injection under the skin. It is administered in a hospital or clinic by a specially trained health care professional. Talk to your pediatrician regarding the use of this medicine in children. While this drug may be prescribed for selected conditions, precautions do apply. Overdosage: If you think you have taken too much of this medicine contact a poison control center or emergency room at once. NOTE: This medicine is only for you. Do not share this medicine with others. What if I miss a dose? It is important not to miss your dose. Call your doctor or health care professional if you are unable to keep an appointment. What may interact with this medicine? Interactions have not been studied. Give your health care provider a list of all the medicines, herbs, non-prescription drugs, or dietary supplements you use. Also tell them if you smoke, drink alcohol, or use illegal drugs. Some items may interact with your medicine. This list may not describe all possible interactions. Give your health care provider a list of all the medicines,  herbs, non-prescription drugs, or dietary supplements you use. Also tell them if you smoke, drink alcohol, or use illegal drugs. Some items may interact with your medicine. What should I watch for while using this medicine? Visit your doctor for checks on your progress. This drug may make you feel generally unwell. This is not uncommon, as chemotherapy can affect healthy cells as well as cancer cells. Report any side effects. Continue your course of treatment even though you feel ill unless your doctor tells you to stop. In some cases, you may be given additional medicines to help with side effects. Follow all directions for their use. Call your doctor or health care professional for advice if you get a fever, chills or sore throat, or other symptoms of a cold or flu. Do not treat yourself. This drug decreases your body's ability to fight infections. Try to avoid being around people who are sick. This medicine may increase your risk to bruise or bleed. Call your doctor or health care professional if you notice any unusual bleeding. Do not have any vaccinations without your doctor's approval and avoid anyone who has recently had oral polio vaccine. Do not become pregnant while taking this medicine. Women should inform their doctor if they wish to become pregnant or think they might be pregnant. There is a potential for serious side effects to an unborn child. Talk to your health care professional or pharmacist for more information. Do not breast-feed an infant while taking this medicine. If you are a man, you should not father a child while receiving treatment. What side effects may I notice from receiving this medicine? Side effects that you should report   to your doctor or health care professional as soon as possible: -allergic reactions like skin rash, itching or hives, swelling of the face, lips, or tongue -low blood counts - this medicine may decrease the number of white blood cells, red blood cells  and platelets. You may be at increased risk for infections and bleeding. -signs of infection - fever or chills, cough, sore throat, pain or difficulty passing urine -signs of decreased platelets or bleeding - bruising, pinpoint red spots on the skin, black, tarry stools, blood in the urine -signs of decreased red blood cells - unusually weak or tired, fainting spells, lightheadedness -reactions at the injection site including redness, pain, itching, or bruising -breathing problems -changes in vision -fever -mouth sores -stomach pain -vomiting Side effects that usually do not require medical attention (report to your doctor or health care professional if they continue or are bothersome): -constipation -diarrhea -loss of appetite -nausea -pain or redness at the injection site -weak or tired This list may not describe all possible side effects. Call your doctor for medical advice about side effects. You may report side effects to FDA at 1-800-FDA-1088. Where should I keep my medicine? This drug is given in a hospital or clinic and will not be stored at home. NOTE: This sheet is a summary. It may not cover all possible information. If you have questions about this medicine, talk to your doctor, pharmacist, or health care provider.    2016, [employer]. (2014-02-19 18:13:53)  

## 2015-08-19 ENCOUNTER — Ambulatory Visit (HOSPITAL_BASED_OUTPATIENT_CLINIC_OR_DEPARTMENT_OTHER): Payer: Medicare Other

## 2015-08-19 VITALS — BP 130/59 | HR 92 | Temp 97.6°F | Resp 18

## 2015-08-19 DIAGNOSIS — D4621 Refractory anemia with excess of blasts 1: Secondary | ICD-10-CM

## 2015-08-19 DIAGNOSIS — Z5111 Encounter for antineoplastic chemotherapy: Secondary | ICD-10-CM

## 2015-08-19 DIAGNOSIS — D4620 Refractory anemia with excess of blasts, unspecified: Secondary | ICD-10-CM

## 2015-08-19 MED ORDER — SODIUM CHLORIDE 0.9 % IV SOLN
Freq: Once | INTRAVENOUS | Status: AC
  Administered 2015-08-19: 09:00:00 via INTRAVENOUS

## 2015-08-19 MED ORDER — SODIUM CHLORIDE 0.9 % IJ SOLN
10.0000 mL | INTRAMUSCULAR | Status: DC | PRN
  Administered 2015-08-19: 10 mL
  Filled 2015-08-19: qty 10

## 2015-08-19 MED ORDER — AZACITIDINE CHEMO INJECTION 100 MG
75.0000 mg/m2 | Freq: Every day | INTRAMUSCULAR | Status: DC
  Administered 2015-08-19: 170 mg via INTRAVENOUS
  Filled 2015-08-19: qty 34

## 2015-08-19 MED ORDER — SODIUM CHLORIDE 0.9 % IV SOLN
Freq: Once | INTRAVENOUS | Status: AC
  Administered 2015-08-19: 09:00:00 via INTRAVENOUS
  Filled 2015-08-19: qty 4

## 2015-08-19 MED ORDER — HEPARIN SOD (PORK) LOCK FLUSH 100 UNIT/ML IV SOLN
500.0000 [IU] | Freq: Once | INTRAVENOUS | Status: AC | PRN
  Administered 2015-08-19: 500 [IU]
  Filled 2015-08-19: qty 5

## 2015-08-19 NOTE — Patient Instructions (Signed)
[facility] Discharge Instructions for Patients Receiving Chemotherapy  Today you received the following chemotherapy agents Vidaza  To help prevent nausea and vomiting after your treatment, we encourage you to take your nausea medication   If you develop nausea and vomiting that is not controlled by your nausea medication, call the clinic.   BELOW ARE SYMPTOMS THAT SHOULD BE REPORTED IMMEDIATELY:  *FEVER GREATER THAN 100.5 F  *CHILLS WITH OR WITHOUT FEVER  NAUSEA AND VOMITING THAT IS NOT CONTROLLED WITH YOUR NAUSEA MEDICATION  *UNUSUAL SHORTNESS OF BREATH  *UNUSUAL BRUISING OR BLEEDING  TENDERNESS IN MOUTH AND THROAT WITH OR WITHOUT PRESENCE OF ULCERS  *URINARY PROBLEMS  *BOWEL PROBLEMS  UNUSUAL RASH Items with * indicate a potential emergency and should be followed up as soon as possible.  Feel free to call the clinic you have any questions or concerns. The clinic phone number is [phone].  Please show the CHEMO ALERT CARD at check-in to the Emergency Department and triage nurse.   

## 2015-08-22 ENCOUNTER — Ambulatory Visit (HOSPITAL_BASED_OUTPATIENT_CLINIC_OR_DEPARTMENT_OTHER): Payer: Medicare Other

## 2015-08-22 ENCOUNTER — Other Ambulatory Visit (HOSPITAL_BASED_OUTPATIENT_CLINIC_OR_DEPARTMENT_OTHER): Payer: Medicare Other

## 2015-08-22 VITALS — BP 119/64 | HR 98 | Temp 97.4°F | Resp 18

## 2015-08-22 DIAGNOSIS — D4621 Refractory anemia with excess of blasts 1: Secondary | ICD-10-CM

## 2015-08-22 DIAGNOSIS — Z452 Encounter for adjustment and management of vascular access device: Secondary | ICD-10-CM | POA: Diagnosis not present

## 2015-08-22 DIAGNOSIS — C9200 Acute myeloblastic leukemia, not having achieved remission: Secondary | ICD-10-CM

## 2015-08-22 DIAGNOSIS — D469 Myelodysplastic syndrome, unspecified: Secondary | ICD-10-CM

## 2015-08-22 LAB — COMPREHENSIVE METABOLIC PANEL
ALT: 15 U/L (ref 0–55)
ANION GAP: 7 meq/L (ref 3–11)
AST: 14 U/L (ref 5–34)
Albumin: 3.2 g/dL — ABNORMAL LOW (ref 3.5–5.0)
Alkaline Phosphatase: 49 U/L (ref 40–150)
BUN: 35.6 mg/dL — ABNORMAL HIGH (ref 7.0–26.0)
CALCIUM: 9.3 mg/dL (ref 8.4–10.4)
CHLORIDE: 103 meq/L (ref 98–109)
CO2: 26 meq/L (ref 22–29)
CREATININE: 1.7 mg/dL — AB (ref 0.7–1.3)
EGFR: 38 mL/min/{1.73_m2} — ABNORMAL LOW (ref >90)
Glucose: 108 mg/dl (ref 70–140)
POTASSIUM: 4.1 meq/L (ref 3.5–5.1)
Sodium: 137 mEq/L (ref 136–145)
Total Bilirubin: 0.37 mg/dL (ref 0.20–1.20)
Total Protein: 7.2 g/dL (ref 6.4–8.3)

## 2015-08-22 LAB — CBC WITH DIFFERENTIAL (CANCER CENTER ONLY)
BASO#: 0 10*3/uL (ref 0.0–0.2)
BASO%: 1.6 % (ref 0.0–2.0)
EOS%: 0.4 % (ref 0.0–7.0)
Eosinophils Absolute: 0 10*3/uL (ref 0.0–0.5)
HEMATOCRIT: 30 % — AB (ref 38.7–49.9)
HEMOGLOBIN: 10.1 g/dL — AB (ref 13.0–17.1)
LYMPH#: 0.4 10*3/uL — ABNORMAL LOW (ref 0.9–3.3)
LYMPH%: 17.1 % (ref 14.0–48.0)
MCH: 33.1 pg (ref 28.0–33.4)
MCHC: 33.7 g/dL (ref 32.0–35.9)
MCV: 98 fL (ref 82–98)
MONO#: 0.2 10*3/uL (ref 0.1–0.9)
MONO%: 6.5 % (ref 0.0–13.0)
NEUT#: 1.8 10*3/uL (ref 1.5–6.5)
NEUT%: 74.4 % (ref 40.0–80.0)
Platelets: 152 10*3/uL (ref 145–400)
RBC: 3.05 10*6/uL — AB (ref 4.20–5.70)
RDW: 20 % — ABNORMAL HIGH (ref 11.1–15.7)
WBC: 2.5 10*3/uL — ABNORMAL LOW (ref 4.0–10.0)

## 2015-08-22 MED ORDER — HEPARIN SOD (PORK) LOCK FLUSH 100 UNIT/ML IV SOLN
500.0000 [IU] | Freq: Once | INTRAVENOUS | Status: AC
  Administered 2015-08-22: 500 [IU] via INTRAVENOUS
  Filled 2015-08-22: qty 5

## 2015-08-22 MED ORDER — SODIUM CHLORIDE 0.9% FLUSH
10.0000 mL | INTRAVENOUS | Status: DC | PRN
  Administered 2015-08-22: 10 mL via INTRAVENOUS
  Filled 2015-08-22: qty 10

## 2015-08-22 NOTE — Patient Instructions (Signed)

## 2015-08-29 ENCOUNTER — Other Ambulatory Visit: Payer: Self-pay | Admitting: *Deleted

## 2015-08-29 DIAGNOSIS — D4620 Refractory anemia with excess of blasts, unspecified: Secondary | ICD-10-CM

## 2015-08-30 ENCOUNTER — Other Ambulatory Visit (HOSPITAL_BASED_OUTPATIENT_CLINIC_OR_DEPARTMENT_OTHER): Payer: Medicare Other

## 2015-08-30 ENCOUNTER — Ambulatory Visit (HOSPITAL_BASED_OUTPATIENT_CLINIC_OR_DEPARTMENT_OTHER): Payer: Medicare Other

## 2015-08-30 ENCOUNTER — Other Ambulatory Visit: Payer: Self-pay | Admitting: *Deleted

## 2015-08-30 VITALS — BP 111/59 | HR 105 | Temp 98.0°F | Resp 20

## 2015-08-30 DIAGNOSIS — D4621 Refractory anemia with excess of blasts 1: Secondary | ICD-10-CM | POA: Diagnosis not present

## 2015-08-30 DIAGNOSIS — D4620 Refractory anemia with excess of blasts, unspecified: Secondary | ICD-10-CM

## 2015-08-30 DIAGNOSIS — Z452 Encounter for adjustment and management of vascular access device: Secondary | ICD-10-CM

## 2015-08-30 LAB — CMP (CANCER CENTER ONLY)
ALK PHOS: 41 U/L (ref 26–84)
ALT: 23 U/L (ref 10–47)
AST: 21 U/L (ref 11–38)
Albumin: 3.1 g/dL — ABNORMAL LOW (ref 3.3–5.5)
BUN: 27 mg/dL — AB (ref 7–22)
CHLORIDE: 102 meq/L (ref 98–108)
CO2: 24 mEq/L (ref 18–33)
Calcium: 9.4 mg/dL (ref 8.0–10.3)
Creat: 1.8 mg/dl — ABNORMAL HIGH (ref 0.6–1.2)
Glucose, Bld: 104 mg/dL (ref 73–118)
Potassium: 3.7 mEq/L (ref 3.3–4.7)
SODIUM: 135 meq/L (ref 128–145)
TOTAL PROTEIN: 7.3 g/dL (ref 6.4–8.1)
Total Bilirubin: 0.7 mg/dl (ref 0.20–1.60)

## 2015-08-30 LAB — CBC WITH DIFFERENTIAL (CANCER CENTER ONLY)
BASO#: 0 10*3/uL (ref 0.0–0.2)
BASO%: 1.4 % (ref 0.0–2.0)
EOS%: 0.4 % (ref 0.0–7.0)
Eosinophils Absolute: 0 10*3/uL (ref 0.0–0.5)
HCT: 27.4 % — ABNORMAL LOW (ref 38.7–49.9)
HGB: 9.4 g/dL — ABNORMAL LOW (ref 13.0–17.1)
LYMPH#: 0.5 10*3/uL — ABNORMAL LOW (ref 0.9–3.3)
LYMPH%: 19.1 % (ref 14.0–48.0)
MCH: 33.2 pg (ref 28.0–33.4)
MCHC: 34.3 g/dL (ref 32.0–35.9)
MCV: 97 fL (ref 82–98)
MONO#: 0.4 10*3/uL (ref 0.1–0.9)
MONO%: 15.6 % — AB (ref 0.0–13.0)
NEUT#: 1.8 10*3/uL (ref 1.5–6.5)
NEUT%: 63.5 % (ref 40.0–80.0)
PLATELETS: 78 10*3/uL — AB (ref 145–400)
RBC: 2.83 10*6/uL — AB (ref 4.20–5.70)
RDW: 20.7 % — ABNORMAL HIGH (ref 11.1–15.7)
WBC: 2.8 10*3/uL — AB (ref 4.0–10.0)

## 2015-08-30 LAB — PREPARE RBC (CROSSMATCH)

## 2015-08-30 LAB — TECHNOLOGIST REVIEW CHCC SATELLITE

## 2015-08-30 MED ORDER — SODIUM CHLORIDE 0.9% FLUSH
10.0000 mL | INTRAVENOUS | Status: DC | PRN
  Administered 2015-08-30: 10 mL via INTRAVENOUS
  Filled 2015-08-30: qty 10

## 2015-08-30 MED ORDER — HEPARIN SOD (PORK) LOCK FLUSH 100 UNIT/ML IV SOLN
500.0000 [IU] | Freq: Once | INTRAVENOUS | Status: AC
  Administered 2015-08-30: 500 [IU] via INTRAVENOUS
  Filled 2015-08-30: qty 5

## 2015-08-30 NOTE — Patient Instructions (Signed)
Fall Prevention in the Home   Falls can cause injuries. They can happen to people of all ages. There are many things you can do to make your home safe and to help prevent falls.   WHAT CAN I DO ON THE OUTSIDE OF MY HOME?  · Regularly fix the edges of walkways and driveways and fix any cracks.  · Remove anything that might make you trip as you walk through a door, such as a raised step or threshold.  · Trim any bushes or trees on the path to your home.  · Use bright outdoor lighting.  · Clear any walking paths of anything that might make someone trip, such as rocks or tools.  · Regularly check to see if handrails are loose or broken. Make sure that both sides of any steps have handrails.  · Any raised decks and porches should have guardrails on the edges.  · Have any leaves, snow, or ice cleared regularly.  · Use sand or salt on walking paths during winter.  · Clean up any spills in your garage right away. This includes oil or grease spills.  WHAT CAN I DO IN THE BATHROOM?   · Use night lights.  · Install grab bars by the toilet and in the tub and shower. Do not use towel bars as grab bars.  · Use non-skid mats or decals in the tub or shower.  · If you need to sit down in the shower, use a plastic, non-slip stool.  · Keep the floor dry. Clean up any water that spills on the floor as soon as it happens.  · Remove soap buildup in the tub or shower regularly.  · Attach bath mats securely with double-sided non-slip rug tape.  · Do not have throw rugs and other things on the floor that can make you trip.  WHAT CAN I DO IN THE BEDROOM?  · Use night lights.  · Make sure that you have a light by your bed that is easy to reach.  · Do not use any sheets or blankets that are too big for your bed. They should not hang down onto the floor.  · Have a firm chair that has side arms. You can use this for support while you get dressed.  · Do not have throw rugs and other things on the floor that can make you trip.  WHAT CAN I DO IN  THE KITCHEN?  · Clean up any spills right away.  · Avoid walking on wet floors.  · Keep items that you use a lot in easy-to-reach places.  · If you need to reach something above you, use a strong step stool that has a grab bar.  · Keep electrical cords out of the way.  · Do not use floor polish or wax that makes floors slippery. If you must use wax, use non-skid floor wax.  · Do not have throw rugs and other things on the floor that can make you trip.  WHAT CAN I DO WITH MY STAIRS?  · Do not leave any items on the stairs.  · Make sure that there are handrails on both sides of the stairs and use them. Fix handrails that are broken or loose. Make sure that handrails are as long as the stairways.  · Check any carpeting to make sure that it is firmly attached to the stairs. Fix any carpet that is loose or worn.  · Avoid having throw rugs at the top   or bottom of the stairs. If you do have throw rugs, attach them to the floor with carpet tape.  · Make sure that you have a light switch at the top of the stairs and the bottom of the stairs. If you do not have them, ask someone to add them for you.  WHAT ELSE CAN I DO TO HELP PREVENT FALLS?  · Wear shoes that:    Do not have high heels.    Have rubber bottoms.    Are comfortable and fit you well.    Are closed at the toe. Do not wear sandals.  · If you use a stepladder:    Make sure that it is fully opened. Do not climb a closed stepladder.    Make sure that both sides of the stepladder are locked into place.    Ask someone to hold it for you, if possible.  · Clearly mark and make sure that you can see:    Any grab bars or handrails.    First and last steps.    Where the edge of each step is.  · Use tools that help you move around (mobility aids) if they are needed. These include:    Canes.    Walkers.    Scooters.    Crutches.  · Turn on the lights when you go into a dark area. Replace any light bulbs as soon as they burn out.  · Set up your furniture so you have a clear  path. Avoid moving your furniture around.  · If any of your floors are uneven, fix them.  · If there are any pets around you, be aware of where they are.  · Review your medicines with your doctor. Some medicines can make you feel dizzy. This can increase your chance of falling.  Ask your doctor what other things that you can do to help prevent falls.     This information is not intended to replace advice given to you by your health care provider. Make sure you discuss any questions you have with your health care provider.     Document Released: 03/24/2009 Document Revised: 10/12/2014 Document Reviewed: 07/02/2014  Elsevier Interactive Patient Education ©2016 [employer]

## 2015-08-31 ENCOUNTER — Ambulatory Visit (HOSPITAL_BASED_OUTPATIENT_CLINIC_OR_DEPARTMENT_OTHER): Payer: Medicare Other

## 2015-08-31 VITALS — BP 134/62 | HR 82 | Temp 97.5°F | Resp 16

## 2015-08-31 DIAGNOSIS — D4621 Refractory anemia with excess of blasts 1: Secondary | ICD-10-CM | POA: Diagnosis not present

## 2015-08-31 DIAGNOSIS — D4620 Refractory anemia with excess of blasts, unspecified: Secondary | ICD-10-CM | POA: Diagnosis not present

## 2015-08-31 MED ORDER — SODIUM CHLORIDE 0.9% FLUSH
10.0000 mL | INTRAVENOUS | Status: DC | PRN
  Filled 2015-08-31: qty 10

## 2015-08-31 MED ORDER — SODIUM CHLORIDE 0.9 % IV SOLN: 250.0000 mL | Freq: Once | INTRAVENOUS | Status: DC

## 2015-08-31 MED ORDER — ACETAMINOPHEN 325 MG PO TABS
ORAL_TABLET | ORAL | Status: AC
  Filled 2015-08-31: qty 2

## 2015-08-31 MED ORDER — ACETAMINOPHEN 325 MG PO TABS
650.0000 mg | ORAL_TABLET | Freq: Once | ORAL | Status: AC
Start: 2015-08-31 — End: 2015-08-31
  Administered 2015-08-31: 650 mg via ORAL

## 2015-08-31 MED ORDER — DIPHENHYDRAMINE HCL 25 MG PO CAPS
25.0000 mg | ORAL_CAPSULE | Freq: Once | ORAL | Status: AC
  Administered 2015-08-31: 25 mg via ORAL

## 2015-08-31 MED ORDER — HEPARIN SOD (PORK) LOCK FLUSH 100 UNIT/ML IV SOLN
500.0000 [IU] | Freq: Every day | INTRAVENOUS | Status: DC | PRN
  Filled 2015-08-31: qty 5

## 2015-08-31 MED ORDER — DIPHENHYDRAMINE HCL 25 MG PO CAPS
ORAL_CAPSULE | ORAL | Status: AC
  Filled 2015-08-31: qty 1

## 2015-08-31 NOTE — Patient Instructions (Signed)

## 2015-09-01 LAB — TYPE AND SCREEN
ABO/RH(D): O POS
Antibody Screen: NEGATIVE
Unit division: 0

## 2015-09-02 ENCOUNTER — Encounter: Payer: Self-pay | Admitting: Hematology & Oncology

## 2015-09-05 ENCOUNTER — Other Ambulatory Visit: Payer: Self-pay | Admitting: *Deleted

## 2015-09-05 DIAGNOSIS — D46Z Other myelodysplastic syndromes: Secondary | ICD-10-CM

## 2015-09-05 DIAGNOSIS — D4620 Refractory anemia with excess of blasts, unspecified: Secondary | ICD-10-CM

## 2015-09-06 ENCOUNTER — Other Ambulatory Visit (HOSPITAL_BASED_OUTPATIENT_CLINIC_OR_DEPARTMENT_OTHER): Payer: Medicare Other

## 2015-09-06 ENCOUNTER — Ambulatory Visit (HOSPITAL_BASED_OUTPATIENT_CLINIC_OR_DEPARTMENT_OTHER): Payer: Medicare Other

## 2015-09-06 VITALS — BP 98/53 | HR 101 | Temp 98.2°F | Resp 18

## 2015-09-06 DIAGNOSIS — D4620 Refractory anemia with excess of blasts, unspecified: Secondary | ICD-10-CM

## 2015-09-06 DIAGNOSIS — D4621 Refractory anemia with excess of blasts 1: Secondary | ICD-10-CM | POA: Diagnosis not present

## 2015-09-06 DIAGNOSIS — D469 Myelodysplastic syndrome, unspecified: Secondary | ICD-10-CM

## 2015-09-06 DIAGNOSIS — Z452 Encounter for adjustment and management of vascular access device: Secondary | ICD-10-CM

## 2015-09-06 LAB — CBC WITH DIFFERENTIAL (CANCER CENTER ONLY)
BASO#: 0 10*3/uL (ref 0.0–0.2)
BASO%: 1.1 % (ref 0.0–2.0)
EOS ABS: 0 10*3/uL (ref 0.0–0.5)
EOS%: 1.1 % (ref 0.0–7.0)
HEMATOCRIT: 26.4 % — AB (ref 38.7–49.9)
HEMOGLOBIN: 9 g/dL — AB (ref 13.0–17.1)
LYMPH#: 0.6 10*3/uL — AB (ref 0.9–3.3)
LYMPH%: 33 % (ref 14.0–48.0)
MCH: 32.5 pg (ref 28.0–33.4)
MCHC: 34.1 g/dL (ref 32.0–35.9)
MCV: 95 fL (ref 82–98)
MONO#: 0.3 10*3/uL (ref 0.1–0.9)
MONO%: 19 % — ABNORMAL HIGH (ref 0.0–13.0)
NEUT%: 45.8 % (ref 40.0–80.0)
NEUTROS ABS: 0.8 10*3/uL — AB (ref 1.5–6.5)
Platelets: 129 10*3/uL — ABNORMAL LOW (ref 145–400)
RBC: 2.77 10*6/uL — AB (ref 4.20–5.70)
RDW: 20.1 % — ABNORMAL HIGH (ref 11.1–15.7)
WBC: 1.8 10*3/uL — AB (ref 4.0–10.0)

## 2015-09-06 LAB — COMPREHENSIVE METABOLIC PANEL
ALBUMIN: 3.1 g/dL — AB (ref 3.5–5.0)
ALK PHOS: 46 U/L (ref 40–150)
ALT: 14 U/L (ref 0–55)
ANION GAP: 9 meq/L (ref 3–11)
AST: 15 U/L (ref 5–34)
BILIRUBIN TOTAL: 0.42 mg/dL (ref 0.20–1.20)
BUN: 29.7 mg/dL — ABNORMAL HIGH (ref 7.0–26.0)
CALCIUM: 9.4 mg/dL (ref 8.4–10.4)
CO2: 24 mEq/L (ref 22–29)
Chloride: 105 mEq/L (ref 98–109)
Creatinine: 1.5 mg/dL — ABNORMAL HIGH (ref 0.7–1.3)
EGFR: 43 mL/min/{1.73_m2} — AB (ref >90)
GLUCOSE: 115 mg/dL (ref 70–140)
POTASSIUM: 4.1 meq/L (ref 3.5–5.1)
SODIUM: 138 meq/L (ref 136–145)
TOTAL PROTEIN: 7.2 g/dL (ref 6.4–8.3)

## 2015-09-06 MED ORDER — SODIUM CHLORIDE 0.9% FLUSH
10.0000 mL | INTRAVENOUS | Status: DC | PRN
  Administered 2015-09-06: 10 mL via INTRAVENOUS
  Filled 2015-09-06: qty 10

## 2015-09-06 MED ORDER — HEPARIN SOD (PORK) LOCK FLUSH 100 UNIT/ML IV SOLN
500.0000 [IU] | Freq: Once | INTRAVENOUS | Status: AC
  Administered 2015-09-06: 500 [IU] via INTRAVENOUS
  Filled 2015-09-06: qty 5

## 2015-09-06 NOTE — Patient Instructions (Signed)

## 2015-09-09 ENCOUNTER — Other Ambulatory Visit: Payer: Self-pay | Admitting: *Deleted

## 2015-09-09 DIAGNOSIS — D4620 Refractory anemia with excess of blasts, unspecified: Secondary | ICD-10-CM

## 2015-09-12 ENCOUNTER — Encounter: Payer: Self-pay | Admitting: Hematology & Oncology

## 2015-09-12 ENCOUNTER — Ambulatory Visit (HOSPITAL_COMMUNITY)
Admission: RE | Admit: 2015-09-12 | Discharge: 2015-09-12 | Disposition: A | Discharge status: Home / Self Care | Payer: Medicare Other | Source: Ambulatory Visit | Attending: Hematology & Oncology | Admitting: Hematology & Oncology

## 2015-09-12 ENCOUNTER — Ambulatory Visit (HOSPITAL_BASED_OUTPATIENT_CLINIC_OR_DEPARTMENT_OTHER): Payer: Medicare Other | Admitting: Hematology & Oncology

## 2015-09-12 ENCOUNTER — Other Ambulatory Visit: Payer: Self-pay | Admitting: Family

## 2015-09-12 ENCOUNTER — Other Ambulatory Visit (HOSPITAL_BASED_OUTPATIENT_CLINIC_OR_DEPARTMENT_OTHER): Payer: Medicare Other

## 2015-09-12 ENCOUNTER — Ambulatory Visit (HOSPITAL_BASED_OUTPATIENT_CLINIC_OR_DEPARTMENT_OTHER): Payer: Medicare Other

## 2015-09-12 VITALS — BP 105/34 | HR 106 | Temp 97.5°F | Resp 16 | Ht 72.0 in | Wt 208.0 lb

## 2015-09-12 VITALS — BP 156/69 | HR 82 | Temp 97.3°F | Resp 14

## 2015-09-12 DIAGNOSIS — D46Z Other myelodysplastic syndromes: Secondary | ICD-10-CM

## 2015-09-12 DIAGNOSIS — D4620 Refractory anemia with excess of blasts, unspecified: Secondary | ICD-10-CM

## 2015-09-12 DIAGNOSIS — D4621 Refractory anemia with excess of blasts 1: Secondary | ICD-10-CM

## 2015-09-12 DIAGNOSIS — D509 Iron deficiency anemia, unspecified: Secondary | ICD-10-CM

## 2015-09-12 DIAGNOSIS — D649 Anemia, unspecified: Secondary | ICD-10-CM

## 2015-09-12 LAB — CMP (CANCER CENTER ONLY)
ALK PHOS: 41 U/L (ref 26–84)
ALT(SGPT): 21 U/L (ref 10–47)
AST: 23 U/L (ref 11–38)
Albumin: 2.8 g/dL — ABNORMAL LOW (ref 3.3–5.5)
BILIRUBIN TOTAL: 0.5 mg/dL (ref 0.20–1.60)
BUN, Bld: 33 mg/dL — ABNORMAL HIGH (ref 7–22)
CALCIUM: 8.7 mg/dL (ref 8.0–10.3)
CO2: 24 meq/L (ref 18–33)
Chloride: 102 mEq/L (ref 98–108)
Creat: 1.8 mg/dl — ABNORMAL HIGH (ref 0.6–1.2)
GLUCOSE: 114 mg/dL (ref 73–118)
POTASSIUM: 3.6 meq/L (ref 3.3–4.7)
Sodium: 135 mEq/L (ref 128–145)
Total Protein: 7 g/dL (ref 6.4–8.1)

## 2015-09-12 LAB — CBC WITH DIFFERENTIAL (CANCER CENTER ONLY)
BASO#: 0 10*3/uL (ref 0.0–0.2)
BASO%: 1.8 % (ref 0.0–2.0)
EOS%: 0.6 % (ref 0.0–7.0)
Eosinophils Absolute: 0 10*3/uL (ref 0.0–0.5)
HEMATOCRIT: 25.1 % — AB (ref 38.7–49.9)
HGB: 8.4 g/dL — ABNORMAL LOW (ref 13.0–17.1)
LYMPH#: 0.5 10*3/uL — AB (ref 0.9–3.3)
LYMPH%: 31.5 % (ref 14.0–48.0)
MCH: 32.6 pg (ref 28.0–33.4)
MCHC: 33.5 g/dL (ref 32.0–35.9)
MCV: 97 fL (ref 82–98)
MONO#: 0.2 10*3/uL (ref 0.1–0.9)
MONO%: 14.3 % — ABNORMAL HIGH (ref 0.0–13.0)
NEUT#: 0.9 10*3/uL — ABNORMAL LOW (ref 1.5–6.5)
NEUT%: 51.8 % (ref 40.0–80.0)
PLATELETS: 202 10*3/uL (ref 145–400)
RBC: 2.58 10*6/uL — ABNORMAL LOW (ref 4.20–5.70)
RDW: 20.9 % — AB (ref 11.1–15.7)
WBC: 1.7 10*3/uL — ABNORMAL LOW (ref 4.0–10.0)

## 2015-09-12 LAB — PREPARE RBC (CROSSMATCH)

## 2015-09-12 LAB — TECHNOLOGIST REVIEW CHCC SATELLITE

## 2015-09-12 MED ORDER — ACETAMINOPHEN 325 MG PO TABS
ORAL_TABLET | ORAL | Status: AC
  Filled 2015-09-12: qty 2

## 2015-09-12 MED ORDER — FUROSEMIDE 10 MG/ML IJ SOLN
20.0000 mg | Freq: Once | INTRAMUSCULAR | Status: AC
  Administered 2015-09-12: 20 mg via INTRAVENOUS

## 2015-09-12 MED ORDER — FUROSEMIDE 10 MG/ML IJ SOLN
INTRAMUSCULAR | Status: AC
  Filled 2015-09-12: qty 4

## 2015-09-12 MED ORDER — SODIUM CHLORIDE 0.9 % IV SOLN: 250.0000 mL | Freq: Once | INTRAVENOUS | Status: AC

## 2015-09-12 MED ORDER — ACETAMINOPHEN 325 MG PO TABS
650.0000 mg | ORAL_TABLET | Freq: Once | ORAL | Status: AC
  Administered 2015-09-12: 650 mg via ORAL

## 2015-09-12 MED ORDER — SODIUM CHLORIDE 0.9 % IV SOLN
Freq: Once | INTRAVENOUS | Status: AC
  Administered 2015-09-12: 10:00:00 via INTRAVENOUS

## 2015-09-12 MED ORDER — SODIUM CHLORIDE 0.9% FLUSH
10.0000 mL | INTRAVENOUS | Status: DC | PRN
  Filled 2015-09-12: qty 10

## 2015-09-12 MED ORDER — HEPARIN SOD (PORK) LOCK FLUSH 100 UNIT/ML IV SOLN
500.0000 [IU] | Freq: Every day | INTRAVENOUS | Status: DC | PRN
  Filled 2015-09-12: qty 5

## 2015-09-12 MED ORDER — DIPHENHYDRAMINE HCL 25 MG PO CAPS
25.0000 mg | ORAL_CAPSULE | Freq: Once | ORAL | Status: AC
  Administered 2015-09-12: 25 mg via ORAL

## 2015-09-12 MED ORDER — DIPHENHYDRAMINE HCL 25 MG PO CAPS
ORAL_CAPSULE | ORAL | Status: AC
  Filled 2015-09-12: qty 1

## 2015-09-12 NOTE — Progress Notes (Signed)
Hematology and Oncology Follow Up Visit  JAQUIN COY [id number] 11-05-1940 75 y.o. 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  Woolum is back for follow-up. Despite having a transfusion last week, he still looks weak. He comes in a wheelchair. His hemoglobin is 8.4. He is not bleeding. He is eating better. He is on prednisone. This is helping his appetite.   He is not hurting. He's had no fever. He's had no cough. He's had no nausea or vomiting.   He's had no leg swelling.   He's had no bruises.   As always, he is very excited about the [employer] championship basketball game tonight. [place]   is in it. Unfortunately, again starts at 9:20 PM which is very late for him.  Overall, his performance status is ECOG 2   Medications:  Current outpatient prescriptions:  .  Albuterol Sulfate (PROAIR RESPICLICK) 979 (90 BASE) MCG/ACT AEPB, Inhale 2 puffs into the lungs every 6 (six) hours as needed., Disp: , Rfl:  .  bimatoprost (LUMIGAN) 0.01 % SOLN, Place 1 drop into both eyes at bedtime., Disp: , Rfl:  .  budesonide-formoterol (SYMBICORT) 160-4.5 MCG/ACT inhaler, Inhale 2 puffs into the lungs 2 (two) times daily., Disp: 3 Inhaler, Rfl: 3 .  buPROPion (WELLBUTRIN XL) 150 MG 24 hr tablet, Take 150 mg by mouth daily., Disp: , Rfl:  .  Cholecalciferol (VITAMIN D3) 5000 UNITS TABS, Take by mouth daily., Disp: , Rfl:  .  clopidogrel (PLAVIX) 75 MG tablet, Take 75 mg by mouth daily. , Disp: , Rfl:  .  co-enzyme Q-10 30 MG capsule, Take 100 mg by mouth daily., Disp: , Rfl:  .  Cyanocobalamin (VITAMIN B-12) 2500 MCG TABS, Take 5,000 mcg by mouth daily., Disp: 90 tablet, Rfl: 9 .  fish oil-omega-3 fatty acids 1000 MG capsule, Take 1 g by mouth daily., Disp: , Rfl:  .  Flaxseed, Linseed, (FLAX SEED  OIL) 1000 MG CAPS, Take 1,000 mg by mouth daily., Disp: , Rfl:  .  LORazepam (ATIVAN) 0.5 MG tablet, Take 1 tablet (0.5 mg total) by mouth every 6 (six) hours as needed (Nausea or vomiting)., Disp: 30 tablet, Rfl: 0 .  losartan (COZAAR) 50 MG tablet, 25 mg daily., Disp: , Rfl:  .  ondansetron (ZOFRAN) 8 MG tablet, Take 1 tablet (8 mg total) by mouth 2 (two) times daily. Start the day after chemo for 2 days. Then take as needed for nausea or vomiting., Disp: 30 tablet, Rfl: 1 .  pantoprazole (PROTONIX) 40 MG tablet, Take 40 mg by mouth daily., Disp: , Rfl:  .  predniSONE (DELTASONE) 20 MG tablet, Take 1 tablet (20 mg total) by mouth daily with breakfast., Disp: 30 tablet, Rfl: 2 .  prochlorperazine (COMPAZINE) 10 MG tablet, Take 1 tablet (10 mg total) by mouth every 6 (six) hours as needed (Nausea or vomiting)., Disp: 30 tablet, Rfl: 1 .  ranitidine (ZANTAC) 300 MG tablet, Take 300 mg by mouth at bedtime. , Disp: , Rfl:  .  sertraline (ZOLOFT) 100 MG tablet, Take 100 mg by mouth daily., Disp: , Rfl: 1 .  SIMBRINZA 1-0.2 % SUSP, , Disp: , Rfl:  .  simvastatin (ZOCOR) 40 MG tablet, Take 40 mg by mouth every other day. , Disp: ,  Rfl:  .  tiotropium (SPIRIVA) 18 MCG inhalation capsule, Place 18 mcg into inhaler and inhale daily., Disp: , Rfl:  No current facility-administered medications for this visit.  Facility-Administered Medications Ordered in Other Visits:  .  0.9 %  sodium chloride infusion, 250 mL, Intravenous, Once, Eliezer Bottom, NP .  acetaminophen (TYLENOL) tablet 650 mg, 650 mg, Oral, Once, Volanda Napoleon, MD .  diphenhydrAMINE (BENADRYL) capsule 25 mg, 25 mg, Oral, Once, Volanda Napoleon, MD .  heparin lock flush 100 unit/mL, 500 Units, Intracatheter, Daily PRN, Holli Humbles [place], NP .  sodium chloride flush (NS) 0.9 % injection 10 mL, 10 mL, Intracatheter, PRN, Eliezer Bottom, NP  Allergies: No Known Allergies  Past Medical History, Surgical history, Social history, and  Family History were reviewed and updated.  Review of Systems: As above  Physical Exam:  height is 6' (1.829 m) and weight is 208 lb (94.348 kg). His oral temperature is 97.5 F (36.4 C). His blood pressure is 105/34 and his pulse is 106. His respiration is 16.   Well-developed and well-nourished white general. Head and neck exam shows no ocular or oral lesions. He has no scleral icterus. He has no adenopathy in the neck. Lungs are clear. Cardiac exam regular rate and rhythm with no murmurs, rubs or bruits. Abdomen is soft. He has good bowel sounds. He's mildly obese. He has no fluid wave. There is no palpable liver or spleen tip. Back exam shows no tenderness over the spine, ribs or hips. Extremities shows no clubbing, cyanosis or edema. Skin exam no rashes, ecchymoses or petechia.  Lab Results  Component Value Date   WBC 1.7* 09/12/2015   HGB 8.4* 09/12/2015   HCT 25.1* 09/12/2015   MCV 97 09/12/2015   PLT 202 09/12/2015     Chemistry      Component Value Date/Time   NA 135 09/12/2015 0828   NA 138 09/06/2015 1100   NA 141 06/17/2015 1155   K 3.6 09/12/2015 0828   K 4.1 09/06/2015 1100   K 4.4 06/17/2015 1155   CL 102 09/12/2015 0828   CL 107 06/17/2015 1155   CO2 24 09/12/2015 0828   CO2 24 09/06/2015 1100   CO2 25 06/17/2015 1155   BUN 33* 09/12/2015 0828   BUN 29.7* 09/06/2015 1100   BUN 27* 06/17/2015 1155   CREATININE 1.8* 09/12/2015 0828   CREATININE 1.5* 09/06/2015 1100   CREATININE 1.48* 06/17/2015 1155      Component Value Date/Time   CALCIUM 8.7 09/12/2015 0828   CALCIUM 9.4 09/06/2015 1100   CALCIUM 9.0 06/17/2015 1155   ALKPHOS 41 09/12/2015 0828   ALKPHOS 46 09/06/2015 1100   ALKPHOS 36* 10/26/2014 1111   AST 23 09/12/2015 0828   AST 15 09/06/2015 1100   AST 19 10/26/2014 1111   ALT 21 09/12/2015 0828   ALT 14 09/06/2015 1100   ALT 15 10/26/2014 1111   BILITOT 0.50 09/12/2015 0828   BILITOT 0.42 09/06/2015 1100   BILITOT 0.4 10/26/2014 1111          Impression and Plan: Mr. 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   I'm not going to treat him this week. I really think he needs neck she we cough.  I will go ahead and give him 2 units of blood. I think this is reasonable.  This will be his fourth cycle of treatment. After this cycle, I will plan for another bone marrow biopsy.  I am just  worried that we are not seeing much of a response as of yet.  I want his quality of life to be better.   We'll see him back in 1 week to make sure that everything is doing okay for him.      Volanda Napoleon, MD 4/3/201710:22 AM

## 2015-09-13 ENCOUNTER — Encounter: Payer: Self-pay | Admitting: Hematology & Oncology

## 2015-09-13 ENCOUNTER — Ambulatory Visit: Payer: Medicare Other

## 2015-09-13 LAB — TYPE AND SCREEN
ABO/RH(D): O POS
ANTIBODY SCREEN: NEGATIVE
Unit division: 0
Unit division: 0

## 2015-09-14 ENCOUNTER — Ambulatory Visit: Payer: Medicare Other

## 2015-09-15 ENCOUNTER — Ambulatory Visit: Payer: Medicare Other

## 2015-09-16 ENCOUNTER — Ambulatory Visit: Payer: Medicare Other

## 2015-09-19 ENCOUNTER — Ambulatory Visit (HOSPITAL_BASED_OUTPATIENT_CLINIC_OR_DEPARTMENT_OTHER): Payer: Medicare Other | Admitting: Family

## 2015-09-19 ENCOUNTER — Ambulatory Visit (HOSPITAL_BASED_OUTPATIENT_CLINIC_OR_DEPARTMENT_OTHER): Payer: Medicare Other

## 2015-09-19 ENCOUNTER — Other Ambulatory Visit (HOSPITAL_BASED_OUTPATIENT_CLINIC_OR_DEPARTMENT_OTHER): Payer: Medicare Other

## 2015-09-19 VITALS — BP 126/69 | HR 90 | Temp 97.3°F | Resp 20 | Wt 208.0 lb

## 2015-09-19 DIAGNOSIS — D4621 Refractory anemia with excess of blasts 1: Secondary | ICD-10-CM

## 2015-09-19 DIAGNOSIS — Z5111 Encounter for antineoplastic chemotherapy: Secondary | ICD-10-CM | POA: Diagnosis not present

## 2015-09-19 DIAGNOSIS — D46Z Other myelodysplastic syndromes: Secondary | ICD-10-CM

## 2015-09-19 DIAGNOSIS — D4620 Refractory anemia with excess of blasts, unspecified: Secondary | ICD-10-CM

## 2015-09-19 DIAGNOSIS — D509 Iron deficiency anemia, unspecified: Secondary | ICD-10-CM

## 2015-09-19 LAB — CMP (CANCER CENTER ONLY)
ALT(SGPT): 21 U/L (ref 10–47)
AST: 19 U/L (ref 11–38)
Albumin: 3 g/dL — ABNORMAL LOW (ref 3.3–5.5)
Alkaline Phosphatase: 50 U/L (ref 26–84)
BILIRUBIN TOTAL: 0.6 mg/dL (ref 0.20–1.60)
BUN, Bld: 23 mg/dL — ABNORMAL HIGH (ref 7–22)
CALCIUM: 9.4 mg/dL (ref 8.0–10.3)
CO2: 27 mEq/L (ref 18–33)
Chloride: 102 mEq/L (ref 98–108)
Creat: 1.3 mg/dl — ABNORMAL HIGH (ref 0.6–1.2)
GLUCOSE: 127 mg/dL — AB (ref 73–118)
POTASSIUM: 4 meq/L (ref 3.3–4.7)
Sodium: 135 mEq/L (ref 128–145)
TOTAL PROTEIN: 7 g/dL (ref 6.4–8.1)

## 2015-09-19 LAB — CBC WITH DIFFERENTIAL (CANCER CENTER ONLY)
BASO#: 0 10*3/uL (ref 0.0–0.2)
BASO%: 1 % (ref 0.0–2.0)
EOS%: 0.5 % (ref 0.0–7.0)
Eosinophils Absolute: 0 10*3/uL (ref 0.0–0.5)
HEMATOCRIT: 31.4 % — AB (ref 38.7–49.9)
HEMOGLOBIN: 10.5 g/dL — AB (ref 13.0–17.1)
LYMPH#: 0.4 10*3/uL — AB (ref 0.9–3.3)
LYMPH%: 21 % (ref 14.0–48.0)
MCH: 31.7 pg (ref 28.0–33.4)
MCHC: 33.4 g/dL (ref 32.0–35.9)
MCV: 95 fL (ref 82–98)
MONO#: 0.2 10*3/uL (ref 0.1–0.9)
MONO%: 10.2 % (ref 0.0–13.0)
NEUT%: 67.3 % (ref 40.0–80.0)
NEUTROS ABS: 1.4 10*3/uL — AB (ref 1.5–6.5)
Platelets: 173 10*3/uL (ref 145–400)
RBC: 3.31 10*6/uL — AB (ref 4.20–5.70)
RDW: 19.6 % — ABNORMAL HIGH (ref 11.1–15.7)
WBC: 2.1 10*3/uL — AB (ref 4.0–10.0)

## 2015-09-19 LAB — RETICULOCYTES: RETICULOCYTE COUNT: 1.7 % (ref 0.6–2.6)

## 2015-09-19 MED ORDER — SODIUM CHLORIDE 0.9 % IV SOLN
Freq: Once | INTRAVENOUS | Status: AC
  Administered 2015-09-19: 13:00:00 via INTRAVENOUS

## 2015-09-19 MED ORDER — LACTATED RINGERS IV SOLN
75.0000 mg/m2 | Freq: Every day | INTRAVENOUS | Status: DC
  Administered 2015-09-19: 170 mg via INTRAVENOUS
  Filled 2015-09-19: qty 34

## 2015-09-19 MED ORDER — SODIUM CHLORIDE 0.9 % IV SOLN
Freq: Once | INTRAVENOUS | Status: AC
  Administered 2015-09-19: 13:00:00 via INTRAVENOUS
  Filled 2015-09-19: qty 4

## 2015-09-19 MED ORDER — SODIUM CHLORIDE 0.9 % IJ SOLN
10.0000 mL | INTRAMUSCULAR | Status: DC | PRN
  Administered 2015-09-19: 10 mL
  Filled 2015-09-19: qty 10

## 2015-09-19 MED ORDER — HEPARIN SOD (PORK) LOCK FLUSH 100 UNIT/ML IV SOLN
500.0000 [IU] | Freq: Once | INTRAVENOUS | Status: AC | PRN
  Administered 2015-09-19: 500 [IU]
  Filled 2015-09-19: qty 5

## 2015-09-19 NOTE — Progress Notes (Signed)
Ok to treat with neutrophils 1.4 and WBC 2.1 per Dr. Marin Olp.

## 2015-09-19 NOTE — Patient Instructions (Signed)
[facility] Discharge Instructions for Patients Receiving Chemotherapy  Today you received the following chemotherapy agents Vidaza  To help prevent nausea and vomiting after your treatment, we encourage you to take your nausea medication   If you develop nausea and vomiting that is not controlled by your nausea medication, call the clinic.   BELOW ARE SYMPTOMS THAT SHOULD BE REPORTED IMMEDIATELY:  *FEVER GREATER THAN 100.5 F  *CHILLS WITH OR WITHOUT FEVER  NAUSEA AND VOMITING THAT IS NOT CONTROLLED WITH YOUR NAUSEA MEDICATION  *UNUSUAL SHORTNESS OF BREATH  *UNUSUAL BRUISING OR BLEEDING  TENDERNESS IN MOUTH AND THROAT WITH OR WITHOUT PRESENCE OF ULCERS  *URINARY PROBLEMS  *BOWEL PROBLEMS  UNUSUAL RASH Items with * indicate a potential emergency and should be followed up as soon as possible.  Feel free to call the clinic you have any questions or concerns. The clinic phone number is [phone].  Please show the CHEMO ALERT CARD at check-in to the Emergency Department and triage nurse.   

## 2015-09-19 NOTE — Progress Notes (Signed)
Hematology and Oncology Follow Up Visit  Anthony [name] [id number] [birth year] 75 y.o. 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 is here today with his wife for a follow-up. He is feeling much better since receiving 2 units of blood last week. His energy has improved and his Hgb is now 10.5 with an MCV of 95.  No fever, chills, n/v, cough, rash, headache, SOB, chest pain, palpitations, night sweats, abdominal pain or changes in bowel or bladder habits. The dizziness had had been experiencing has resolved.  He is still not sleeping well but states that this has been an issue for several years.  No episodes of bleeding or bruising. No lymphadenopathy found on exam.  No swelling or tenderness in his extremities.The numbness and tingling in his toes is unchanged. He denies joint aches or "bone" pain. He is using a cane now to ambulate and this has helped with his balance. He has had no falls or syncopal episodes.  His appetite is improving and he is staying well hydrated. He still supplements with boost daily. His weight is unchanged.   Medications:    Medication List       This list is accurate as of: 09/19/15 11:44 AM.  Always use your most recent med list.               bimatoprost 0.01 % Soln  Commonly known as:  LUMIGAN  Place 1 drop into both eyes at bedtime.     budesonide-formoterol 160-4.5 MCG/ACT inhaler  Commonly known as:  SYMBICORT  Inhale 2 puffs into the lungs 2 (two) times daily.     buPROPion 150 MG 24 hr tablet  Commonly known as:  WELLBUTRIN XL  Take 150 mg by mouth daily.     clopidogrel 75 MG tablet  Commonly known as:  PLAVIX  Take 75 mg by mouth daily.     co-enzyme Q-10 30 MG capsule  Take 100 mg by mouth daily.     Cyanocobalamin 2500 MCG Tabs  Take 5,000 mcg by mouth daily.     fish oil-omega-3 fatty acids 1000 MG capsule  Take 1 g by mouth daily.     Flax Seed Oil 1000 MG Caps  Take 1,000 mg by mouth daily.     LORazepam 0.5 MG tablet  Commonly known as:  ATIVAN  Take 1 tablet (0.5 mg total) by mouth every 6 (six) hours as needed (Nausea or vomiting).     ondansetron 8 MG tablet  Commonly known as:  ZOFRAN  Take 1 tablet (8 mg total) by mouth 2 (two) times daily. Start the day after chemo for 2 days. Then take as needed for nausea or vomiting.     pantoprazole 40 MG tablet  Commonly known as:  PROTONIX  Take 40 mg by mouth daily.     predniSONE 20 MG tablet  Commonly known as:  DELTASONE  Take 1 tablet (20 mg total) by mouth daily with breakfast.     PROAIR RESPICLICK 712 (90 Base) MCG/ACT Aepb  Generic drug:  Albuterol Sulfate  Inhale 2 puffs into the lungs every 6 (six) hours as needed.     prochlorperazine 10 MG tablet  Commonly known as:  COMPAZINE  Take 1 tablet (10  mg total) by mouth every 6 (six) hours as needed (Nausea or vomiting).     ranitidine 300 MG tablet  Commonly known as:  ZANTAC  Take 300 mg by mouth at bedtime.     sertraline 100 MG tablet  Commonly known as:  ZOLOFT  Take 100 mg by mouth daily.     SIMBRINZA 1-0.2 % Susp  Generic drug:  Brinzolamide-Brimonidine     simvastatin 40 MG tablet  Commonly known as:  ZOCOR  Take 40 mg by mouth every other day.     tiotropium 18 MCG inhalation capsule  Commonly known as:  SPIRIVA  Place 18 mcg into inhaler and inhale daily.     Vitamin D3 5000 units Tabs  Take by mouth daily.        Allergies: No Known Allergies  Past Medical History, Surgical history, Social history, and Family History were reviewed and updated.  Review of Systems: All other 10 point review of systems is negative.   Physical Exam:  weight is 208 lb (94.348 kg). His oral temperature is 97.3 F (36.3 C). His blood pressure is  126/69 and his pulse is 90. His respiration is 20.   Wt Readings from Last 3 Encounters:  09/19/15 208 lb (94.348 kg)  09/12/15 208 lb (94.348 kg)  08/15/15 211 lb (95.709 kg)    Ocular: Sclerae unicteric, pupils equal, round and reactive to light Ear-nose-throat: Oropharynx clear, dentition fair Lymphatic: No cervical supraclavicular or axillary adenopathy Lungs no rales or rhonchi, good excursion bilaterally Heart regular rate and rhythm, no murmur appreciated Abd soft, nontender, positive bowel sounds, no oliver or spleen tip palpated on exam, no fluid wave MSK no focal spinal tenderness, no joint edema Neuro: non-focal, well-oriented, appropriate affect  Lab Results  Component Value Date   WBC 2.1* 09/19/2015   HGB 10.5* 09/19/2015   HCT 31.4* 09/19/2015   MCV 95 09/19/2015   PLT 173 09/19/2015   Lab Results  Component Value Date   FERRITIN 749* 07/18/2015   IRON 163 07/18/2015   TIBC 308 07/18/2015   UIBC 144 07/18/2015   IRONPCTSAT 53 07/18/2015   Lab Results  Component Value Date   RETICCTPCT 1.7 05/30/2015   RBC 3.31* 09/19/2015   RETICCTABS 39.4 05/30/2015   No results found for: KPAFRELGTCHN, LAMBDASER, KAPLAMBRATIO No results found for: Kandis Cocking [facility] Lab Results  Component Value Date   TOTALPROTELP 7.3 01/01/2014   ALBUMINELP 55.1* 01/01/2014   A1GS 6.9* 01/01/2014   A2GS 8.9 01/01/2014   BETS 7.9* 01/01/2014   BETA2SER 6.7* 01/01/2014   GAMS 14.5 01/01/2014   MSPIKE NOT DET 01/01/2014   SPEI * 01/01/2014     Chemistry      Component Value Date/Time   NA 135 09/12/2015 0828   NA 138 09/06/2015 1100   NA 141 06/17/2015 1155   K 3.6 09/12/2015 0828   K 4.1 09/06/2015 1100   K 4.4 06/17/2015 1155   CL 102 09/12/2015 0828   CL 107 06/17/2015 1155   CO2 24 09/12/2015 0828   CO2 24 09/06/2015 1100   CO2 25 06/17/2015 1155   BUN 33* 09/12/2015 0828   BUN 29.7* 09/06/2015 1100   BUN 27* 06/17/2015 1155   CREATININE 1.8* 09/12/2015  0828   CREATININE 1.5* 09/06/2015 1100   CREATININE 1.48* 06/17/2015 1155      Component Value Date/Time   CALCIUM 8.7 09/12/2015 0828   CALCIUM 9.4 09/06/2015 1100   CALCIUM 9.0 06/17/2015 1155  ALKPHOS 41 09/12/2015 0828   ALKPHOS 46 09/06/2015 1100   ALKPHOS 36* 10/26/2014 1111   AST 23 09/12/2015 0828   AST 15 09/06/2015 1100   AST 19 10/26/2014 1111   ALT 21 09/12/2015 0828   ALT 14 09/06/2015 1100   ALT 15 10/26/2014 1111   BILITOT 0.50 09/12/2015 0828   BILITOT 0.42 09/06/2015 1100   BILITOT 0.4 10/26/2014 1111     Impression and Plan: Mr. Vannote is 75 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 Treatment last week was held due to anemia and he was given 2 units of blood. He is feeling much better this week and has no complaints at this time. He Hgb is now up to 10.5 with an MCV of 95. His WBC count is also up to 2.1. We will proceed with cycle 4 today as planned and Dr. Marin Olp will then schedule him for a bone marrow biopsy in the next few weeks.  He has his current treatment/appoinment schedule.  He will contact us with any questions or concerns. We can certainly see him sooner if need be.   Eliezer Bottom, NP 4/10/201711:44 AM

## 2015-09-20 ENCOUNTER — Ambulatory Visit (HOSPITAL_BASED_OUTPATIENT_CLINIC_OR_DEPARTMENT_OTHER): Payer: Medicare Other

## 2015-09-20 VITALS — BP 125/71 | HR 97 | Temp 97.6°F | Resp 16

## 2015-09-20 DIAGNOSIS — Z5111 Encounter for antineoplastic chemotherapy: Secondary | ICD-10-CM | POA: Diagnosis not present

## 2015-09-20 DIAGNOSIS — D4621 Refractory anemia with excess of blasts 1: Secondary | ICD-10-CM

## 2015-09-20 DIAGNOSIS — D4620 Refractory anemia with excess of blasts, unspecified: Secondary | ICD-10-CM

## 2015-09-20 LAB — IRON AND TIBC
%SAT: 82 % — ABNORMAL HIGH (ref 20–55)
IRON: 211 ug/dL — AB (ref 42–163)
TIBC: 258 ug/dL (ref 202–409)
UIBC: 47 ug/dL — AB (ref 117–376)

## 2015-09-20 LAB — FERRITIN

## 2015-09-20 MED ORDER — LACTATED RINGERS IV SOLN
75.0000 mg/m2 | Freq: Every day | INTRAVENOUS | Status: DC
  Administered 2015-09-20: 170 mg via INTRAVENOUS
  Filled 2015-09-20: qty 34

## 2015-09-20 MED ORDER — SODIUM CHLORIDE 0.9 % IV SOLN
Freq: Once | INTRAVENOUS | Status: AC
  Administered 2015-09-20: 09:00:00 via INTRAVENOUS
  Filled 2015-09-20: qty 4

## 2015-09-20 MED ORDER — SODIUM CHLORIDE 0.9 % IV SOLN
Freq: Once | INTRAVENOUS | Status: AC
  Administered 2015-09-20: 09:00:00 via INTRAVENOUS

## 2015-09-20 MED ORDER — HEPARIN SOD (PORK) LOCK FLUSH 100 UNIT/ML IV SOLN
500.0000 [IU] | Freq: Once | INTRAVENOUS | Status: AC | PRN
  Administered 2015-09-20: 500 [IU]
  Filled 2015-09-20: qty 5

## 2015-09-20 MED ORDER — SODIUM CHLORIDE 0.9 % IJ SOLN
10.0000 mL | INTRAMUSCULAR | Status: DC | PRN
  Administered 2015-09-20: 10 mL
  Filled 2015-09-20: qty 10

## 2015-09-20 NOTE — Patient Instructions (Signed)
Azacitidine suspension for injection (subcutaneous use) What is this medicine? AZACITIDINE (ay za SITE i deen) is a chemotherapy drug. This medicine reduces the growth of cancer cells and can suppress the immune system. It is used for treating myelodysplastic syndrome or some types of leukemia. This medicine may be used for other purposes; ask your health care provider or pharmacist if you have questions. What should I tell my health care provider before I take this medicine? They need to know if you have any of these conditions: -infection (especially a virus infection such as chickenpox, cold sores, or herpes) -kidney disease -liver disease -liver tumors -an unusual or allergic reaction to azacitidine, mannitol, other medicines, foods, dyes, or preservatives -pregnant or trying to get pregnant -breast-feeding How should I use this medicine? This medicine is for injection under the skin. It is administered in a hospital or clinic by a specially trained health care professional. Talk to your pediatrician regarding the use of this medicine in children. While this drug may be prescribed for selected conditions, precautions do apply. Overdosage: If you think you have taken too much of this medicine contact a poison control center or emergency room at once. NOTE: This medicine is only for you. Do not share this medicine with others. What if I miss a dose? It is important not to miss your dose. Call your doctor or health care professional if you are unable to keep an appointment. What may interact with this medicine? Interactions have not been studied. Give your health care provider a list of all the medicines, herbs, non-prescription drugs, or dietary supplements you use. Also tell them if you smoke, drink alcohol, or use illegal drugs. Some items may interact with your medicine. This list may not describe all possible interactions. Give your health care provider a list of all the medicines,  herbs, non-prescription drugs, or dietary supplements you use. Also tell them if you smoke, drink alcohol, or use illegal drugs. Some items may interact with your medicine. What should I watch for while using this medicine? Visit your doctor for checks on your progress. This drug may make you feel generally unwell. This is not uncommon, as chemotherapy can affect healthy cells as well as cancer cells. Report any side effects. Continue your course of treatment even though you feel ill unless your doctor tells you to stop. In some cases, you may be given additional medicines to help with side effects. Follow all directions for their use. Call your doctor or health care professional for advice if you get a fever, chills or sore throat, or other symptoms of a cold or flu. Do not treat yourself. This drug decreases your body's ability to fight infections. Try to avoid being around people who are sick. This medicine may increase your risk to bruise or bleed. Call your doctor or health care professional if you notice any unusual bleeding. Do not have any vaccinations without your doctor's approval and avoid anyone who has recently had oral polio vaccine. Do not become pregnant while taking this medicine. Women should inform their doctor if they wish to become pregnant or think they might be pregnant. There is a potential for serious side effects to an unborn child. Talk to your health care professional or pharmacist for more information. Do not breast-feed an infant while taking this medicine. If you are a man, you should not father a child while receiving treatment. What side effects may I notice from receiving this medicine? Side effects that you should report   to your doctor or health care professional as soon as possible: -allergic reactions like skin rash, itching or hives, swelling of the face, lips, or tongue -low blood counts - this medicine may decrease the number of white blood cells, red blood cells  and platelets. You may be at increased risk for infections and bleeding. -signs of infection - fever or chills, cough, sore throat, pain or difficulty passing urine -signs of decreased platelets or bleeding - bruising, pinpoint red spots on the skin, black, tarry stools, blood in the urine -signs of decreased red blood cells - unusually weak or tired, fainting spells, lightheadedness -reactions at the injection site including redness, pain, itching, or bruising -breathing problems -changes in vision -fever -mouth sores -stomach pain -vomiting Side effects that usually do not require medical attention (report to your doctor or health care professional if they continue or are bothersome): -constipation -diarrhea -loss of appetite -nausea -pain or redness at the injection site -weak or tired This list may not describe all possible side effects. Call your doctor for medical advice about side effects. You may report side effects to FDA at 1-800-FDA-1088. Where should I keep my medicine? This drug is given in a hospital or clinic and will not be stored at home. NOTE: This sheet is a summary. It may not cover all possible information. If you have questions about this medicine, talk to your doctor, pharmacist, or health care provider.    2016, [employer]. (2014-02-19 18:13:53)  

## 2015-09-21 ENCOUNTER — Ambulatory Visit (HOSPITAL_BASED_OUTPATIENT_CLINIC_OR_DEPARTMENT_OTHER): Payer: Medicare Other

## 2015-09-21 VITALS — BP 116/62 | HR 95 | Temp 97.5°F | Resp 18

## 2015-09-21 DIAGNOSIS — Z5111 Encounter for antineoplastic chemotherapy: Secondary | ICD-10-CM

## 2015-09-21 DIAGNOSIS — D4620 Refractory anemia with excess of blasts, unspecified: Secondary | ICD-10-CM

## 2015-09-21 DIAGNOSIS — D4621 Refractory anemia with excess of blasts 1: Secondary | ICD-10-CM | POA: Diagnosis not present

## 2015-09-21 MED ORDER — SODIUM CHLORIDE 0.9 % IV SOLN
Freq: Once | INTRAVENOUS | Status: AC
  Administered 2015-09-21: 10:00:00 via INTRAVENOUS
  Filled 2015-09-21: qty 4

## 2015-09-21 MED ORDER — SODIUM CHLORIDE 0.9 % IJ SOLN
10.0000 mL | INTRAMUSCULAR | Status: DC | PRN
  Administered 2015-09-21: 10 mL
  Filled 2015-09-21: qty 10

## 2015-09-21 MED ORDER — SODIUM CHLORIDE 0.9 % IV SOLN
Freq: Once | INTRAVENOUS | Status: AC
  Administered 2015-09-21: 09:00:00 via INTRAVENOUS

## 2015-09-21 MED ORDER — HEPARIN SOD (PORK) LOCK FLUSH 100 UNIT/ML IV SOLN
500.0000 [IU] | Freq: Once | INTRAVENOUS | Status: AC | PRN
  Administered 2015-09-21: 500 [IU]
  Filled 2015-09-21: qty 5

## 2015-09-21 MED ORDER — LACTATED RINGERS IV SOLN
75.0000 mg/m2 | Freq: Every day | INTRAVENOUS | Status: DC
  Administered 2015-09-21: 170 mg via INTRAVENOUS
  Filled 2015-09-21: qty 34

## 2015-09-21 NOTE — Patient Instructions (Signed)
[facility] Discharge Instructions for Patients Receiving Chemotherapy  Today you received the following chemotherapy agents Vidaza  To help prevent nausea and vomiting after your treatment, we encourage you to take your nausea medication   If you develop nausea and vomiting that is not controlled by your nausea medication, call the clinic.   BELOW ARE SYMPTOMS THAT SHOULD BE REPORTED IMMEDIATELY:  *FEVER GREATER THAN 100.5 F  *CHILLS WITH OR WITHOUT FEVER  NAUSEA AND VOMITING THAT IS NOT CONTROLLED WITH YOUR NAUSEA MEDICATION  *UNUSUAL SHORTNESS OF BREATH  *UNUSUAL BRUISING OR BLEEDING  TENDERNESS IN MOUTH AND THROAT WITH OR WITHOUT PRESENCE OF ULCERS  *URINARY PROBLEMS  *BOWEL PROBLEMS  UNUSUAL RASH Items with * indicate a potential emergency and should be followed up as soon as possible.  Feel free to call the clinic you have any questions or concerns. The clinic phone number is [phone].  Please show the CHEMO ALERT CARD at check-in to the Emergency Department and triage nurse.   

## 2015-09-22 ENCOUNTER — Ambulatory Visit (HOSPITAL_BASED_OUTPATIENT_CLINIC_OR_DEPARTMENT_OTHER): Payer: Medicare Other

## 2015-09-22 VITALS — BP 122/58 | HR 91 | Temp 97.7°F | Resp 20

## 2015-09-22 DIAGNOSIS — D4620 Refractory anemia with excess of blasts, unspecified: Secondary | ICD-10-CM

## 2015-09-22 DIAGNOSIS — Z5111 Encounter for antineoplastic chemotherapy: Secondary | ICD-10-CM

## 2015-09-22 DIAGNOSIS — D4621 Refractory anemia with excess of blasts 1: Secondary | ICD-10-CM | POA: Diagnosis not present

## 2015-09-22 MED ORDER — LACTATED RINGERS IV SOLN
75.0000 mg/m2 | Freq: Every day | INTRAVENOUS | Status: DC
  Administered 2015-09-22: 170 mg via INTRAVENOUS
  Filled 2015-09-22: qty 34

## 2015-09-22 MED ORDER — HEPARIN SOD (PORK) LOCK FLUSH 100 UNIT/ML IV SOLN
500.0000 [IU] | Freq: Once | INTRAVENOUS | Status: AC | PRN
  Administered 2015-09-22: 500 [IU]
  Filled 2015-09-22: qty 5

## 2015-09-22 MED ORDER — SODIUM CHLORIDE 0.9 % IJ SOLN
10.0000 mL | INTRAMUSCULAR | Status: DC | PRN
  Administered 2015-09-22: 10 mL
  Filled 2015-09-22: qty 10

## 2015-09-22 MED ORDER — SODIUM CHLORIDE 0.9 % IV SOLN
Freq: Once | INTRAVENOUS | Status: AC
  Administered 2015-09-22: 10:00:00 via INTRAVENOUS
  Filled 2015-09-22: qty 4

## 2015-09-22 MED ORDER — SODIUM CHLORIDE 0.9 % IV SOLN
Freq: Once | INTRAVENOUS | Status: AC
  Administered 2015-09-22: 10:00:00 via INTRAVENOUS

## 2015-09-22 NOTE — Patient Instructions (Signed)
[facility] Discharge Instructions for Patients Receiving Chemotherapy  Today you received the following chemotherapy agents Vidaza  To help prevent nausea and vomiting after your treatment, we encourage you to take your nausea medication   If you develop nausea and vomiting that is not controlled by your nausea medication, call the clinic.   BELOW ARE SYMPTOMS THAT SHOULD BE REPORTED IMMEDIATELY:  *FEVER GREATER THAN 100.5 F  *CHILLS WITH OR WITHOUT FEVER  NAUSEA AND VOMITING THAT IS NOT CONTROLLED WITH YOUR NAUSEA MEDICATION  *UNUSUAL SHORTNESS OF BREATH  *UNUSUAL BRUISING OR BLEEDING  TENDERNESS IN MOUTH AND THROAT WITH OR WITHOUT PRESENCE OF ULCERS  *URINARY PROBLEMS  *BOWEL PROBLEMS  UNUSUAL RASH Items with * indicate a potential emergency and should be followed up as soon as possible.  Feel free to call the clinic you have any questions or concerns. The clinic phone number is [phone].  Please show the CHEMO ALERT CARD at check-in to the Emergency Department and triage nurse.   

## 2015-09-23 ENCOUNTER — Ambulatory Visit (HOSPITAL_BASED_OUTPATIENT_CLINIC_OR_DEPARTMENT_OTHER): Payer: Medicare Other

## 2015-09-23 VITALS — BP 119/63 | HR 91 | Resp 18

## 2015-09-23 DIAGNOSIS — Z5111 Encounter for antineoplastic chemotherapy: Secondary | ICD-10-CM | POA: Diagnosis not present

## 2015-09-23 DIAGNOSIS — D4621 Refractory anemia with excess of blasts 1: Secondary | ICD-10-CM | POA: Diagnosis not present

## 2015-09-23 DIAGNOSIS — D4620 Refractory anemia with excess of blasts, unspecified: Secondary | ICD-10-CM

## 2015-09-23 MED ORDER — SODIUM CHLORIDE 0.9 % IV SOLN
Freq: Once | INTRAVENOUS | Status: AC
  Administered 2015-09-23: 08:00:00 via INTRAVENOUS
  Filled 2015-09-23: qty 4

## 2015-09-23 MED ORDER — SODIUM CHLORIDE 0.9 % IV SOLN
Freq: Once | INTRAVENOUS | Status: AC
  Administered 2015-09-23: 08:00:00 via INTRAVENOUS

## 2015-09-23 MED ORDER — LACTATED RINGERS IV SOLN
75.0000 mg/m2 | Freq: Every day | INTRAVENOUS | Status: DC
  Administered 2015-09-23: 170 mg via INTRAVENOUS
  Filled 2015-09-23: qty 34

## 2015-09-23 MED ORDER — SODIUM CHLORIDE 0.9 % IJ SOLN
10.0000 mL | INTRAMUSCULAR | Status: DC | PRN
  Administered 2015-09-23: 10 mL
  Filled 2015-09-23: qty 10

## 2015-09-23 MED ORDER — HEPARIN SOD (PORK) LOCK FLUSH 100 UNIT/ML IV SOLN
500.0000 [IU] | Freq: Once | INTRAVENOUS | Status: AC | PRN
  Administered 2015-09-23: 500 [IU]
  Filled 2015-09-23: qty 5

## 2015-09-23 NOTE — Patient Instructions (Signed)
[facility] Discharge Instructions for Patients Receiving Chemotherapy  Today you received the following chemotherapy agents Vidaza   To help prevent nausea and vomiting after your treatment, we encourage you to take your nausea medication Allopurinol 100mg  po daily, Ativan 1mg  every 3 to 4 hours as needed for nausea or vomiting, Decadron 8mg  Take 2 times the day before, the day of and the day after chemo, Prednisone 100mg  Take Days 1 thru 5 every 21 days with chemo and Zofran 8mg  every 8 hours as needed for nausea or vomiting   If you develop nausea and vomiting that is not controlled by your nausea medication, call the clinic.   BELOW ARE SYMPTOMS THAT SHOULD BE REPORTED IMMEDIATELY:  *FEVER GREATER THAN 100.5 F  *CHILLS WITH OR WITHOUT FEVER  NAUSEA AND VOMITING THAT IS NOT CONTROLLED WITH YOUR NAUSEA MEDICATION  *UNUSUAL SHORTNESS OF BREATH  *UNUSUAL BRUISING OR BLEEDING  TENDERNESS IN MOUTH AND THROAT WITH OR WITHOUT PRESENCE OF ULCERS  *URINARY PROBLEMS  *BOWEL PROBLEMS  UNUSUAL RASH Items with * indicate a potential emergency and should be followed up as soon as possible.  Feel free to call the clinic you have any questions or concerns. The clinic phone number is [phone].  Please show the [place] at check-in to the Emergency Department and triage nurse.

## 2015-09-26 ENCOUNTER — Other Ambulatory Visit (HOSPITAL_BASED_OUTPATIENT_CLINIC_OR_DEPARTMENT_OTHER): Payer: Medicare Other

## 2015-09-26 ENCOUNTER — Ambulatory Visit (HOSPITAL_BASED_OUTPATIENT_CLINIC_OR_DEPARTMENT_OTHER): Payer: Medicare Other

## 2015-09-26 ENCOUNTER — Other Ambulatory Visit: Payer: Self-pay | Admitting: *Deleted

## 2015-09-26 DIAGNOSIS — Z452 Encounter for adjustment and management of vascular access device: Secondary | ICD-10-CM

## 2015-09-26 DIAGNOSIS — D4620 Refractory anemia with excess of blasts, unspecified: Secondary | ICD-10-CM

## 2015-09-26 DIAGNOSIS — D4621 Refractory anemia with excess of blasts 1: Secondary | ICD-10-CM

## 2015-09-26 LAB — COMPREHENSIVE METABOLIC PANEL
ALBUMIN: 3.2 g/dL — AB (ref 3.5–5.0)
ALK PHOS: 50 U/L (ref 40–150)
ALT: 16 U/L (ref 0–55)
AST: 15 U/L (ref 5–34)
Anion Gap: 7 mEq/L (ref 3–11)
BILIRUBIN TOTAL: 0.34 mg/dL (ref 0.20–1.20)
BUN: 26.9 mg/dL — AB (ref 7.0–26.0)
CO2: 27 meq/L (ref 22–29)
Calcium: 9.6 mg/dL (ref 8.4–10.4)
Chloride: 104 mEq/L (ref 98–109)
Creatinine: 1.5 mg/dL — ABNORMAL HIGH (ref 0.7–1.3)
EGFR: 43 mL/min/{1.73_m2} — ABNORMAL LOW (ref >90)
GLUCOSE: 118 mg/dL (ref 70–140)
Potassium: 3.7 mEq/L (ref 3.5–5.1)
SODIUM: 139 meq/L (ref 136–145)
TOTAL PROTEIN: 6.8 g/dL (ref 6.4–8.3)

## 2015-09-26 LAB — CBC WITH DIFFERENTIAL (CANCER CENTER ONLY)
BASO#: 0 10*3/uL (ref 0.0–0.2)
BASO%: 0.9 % (ref 0.0–2.0)
EOS ABS: 0 10*3/uL (ref 0.0–0.5)
EOS%: 0 % (ref 0.0–7.0)
HCT: 29.9 % — ABNORMAL LOW (ref 38.7–49.9)
HEMOGLOBIN: 10 g/dL — AB (ref 13.0–17.1)
LYMPH#: 1.1 10*3/uL (ref 0.9–3.3)
LYMPH%: 31 % (ref 14.0–48.0)
MCH: 32.1 pg (ref 28.0–33.4)
MCHC: 33.4 g/dL (ref 32.0–35.9)
MCV: 96 fL (ref 82–98)
MONO#: 0.4 10*3/uL (ref 0.1–0.9)
MONO%: 10.9 % (ref 0.0–13.0)
NEUT%: 57.2 % (ref 40.0–80.0)
NEUTROS ABS: 2 10*3/uL (ref 1.5–6.5)
Platelets: 143 10*3/uL — ABNORMAL LOW (ref 145–400)
RBC: 3.12 10*6/uL — AB (ref 4.20–5.70)
RDW: 20.7 % — ABNORMAL HIGH (ref 11.1–15.7)
WBC: 3.5 10*3/uL — AB (ref 4.0–10.0)

## 2015-09-26 LAB — TECHNOLOGIST REVIEW CHCC SATELLITE: Tech Review: 2

## 2015-09-26 MED ORDER — SODIUM CHLORIDE 0.9% FLUSH
10.0000 mL | INTRAVENOUS | Status: DC | PRN
  Administered 2015-09-26: 10 mL via INTRAVENOUS
  Filled 2015-09-26: qty 10

## 2015-09-26 MED ORDER — HEPARIN SOD (PORK) LOCK FLUSH 100 UNIT/ML IV SOLN
500.0000 [IU] | Freq: Once | INTRAVENOUS | Status: AC
  Administered 2015-09-26: 500 [IU] via INTRAVENOUS
  Filled 2015-09-26: qty 5

## 2015-09-26 MED ORDER — HEPARIN SOD (PORK) LOCK FLUSH 100 UNIT/ML IV SOLN
500.0000 [IU] | Freq: Once | INTRAVENOUS | Status: DC
  Filled 2015-09-26: qty 5

## 2015-09-26 MED ORDER — SODIUM CHLORIDE 0.9% FLUSH
10.0000 mL | INTRAVENOUS | Status: DC | PRN
  Filled 2015-09-26: qty 10

## 2015-09-26 NOTE — Patient Instructions (Signed)

## 2015-09-30 ENCOUNTER — Encounter: Payer: Self-pay | Admitting: Hematology & Oncology

## 2015-10-04 ENCOUNTER — Other Ambulatory Visit (HOSPITAL_BASED_OUTPATIENT_CLINIC_OR_DEPARTMENT_OTHER): Payer: Medicare Other

## 2015-10-04 ENCOUNTER — Ambulatory Visit (HOSPITAL_BASED_OUTPATIENT_CLINIC_OR_DEPARTMENT_OTHER): Payer: Medicare Other

## 2015-10-04 VITALS — BP 128/53 | HR 109 | Temp 98.2°F | Resp 18

## 2015-10-04 DIAGNOSIS — D4620 Refractory anemia with excess of blasts, unspecified: Secondary | ICD-10-CM

## 2015-10-04 DIAGNOSIS — D4621 Refractory anemia with excess of blasts 1: Secondary | ICD-10-CM | POA: Diagnosis not present

## 2015-10-04 DIAGNOSIS — Z452 Encounter for adjustment and management of vascular access device: Secondary | ICD-10-CM | POA: Diagnosis not present

## 2015-10-04 DIAGNOSIS — D469 Myelodysplastic syndrome, unspecified: Secondary | ICD-10-CM

## 2015-10-04 DIAGNOSIS — D509 Iron deficiency anemia, unspecified: Secondary | ICD-10-CM

## 2015-10-04 LAB — CBC WITH DIFFERENTIAL (CANCER CENTER ONLY)
BASO#: 0 10*3/uL (ref 0.0–0.2)
BASO%: 1.1 % (ref 0.0–2.0)
EOS ABS: 0 10*3/uL (ref 0.0–0.5)
EOS%: 0.4 % (ref 0.0–7.0)
HCT: 27.2 % — ABNORMAL LOW (ref 38.7–49.9)
HEMOGLOBIN: 9.3 g/dL — AB (ref 13.0–17.1)
LYMPH#: 0.9 10*3/uL (ref 0.9–3.3)
LYMPH%: 31.1 % (ref 14.0–48.0)
MCH: 32.6 pg (ref 28.0–33.4)
MCHC: 34.2 g/dL (ref 32.0–35.9)
MCV: 95 fL (ref 82–98)
MONO#: 0.6 10*3/uL (ref 0.1–0.9)
MONO%: 19.8 % — ABNORMAL HIGH (ref 0.0–13.0)
NEUT#: 1.4 10*3/uL — ABNORMAL LOW (ref 1.5–6.5)
NEUT%: 47.6 % (ref 40.0–80.0)
PLATELETS: 68 10*3/uL — AB (ref 145–400)
RBC: 2.85 10*6/uL — AB (ref 4.20–5.70)
RDW: 21.4 % — ABNORMAL HIGH (ref 11.1–15.7)
WBC: 2.8 10*3/uL — AB (ref 4.0–10.0)

## 2015-10-04 LAB — COMPREHENSIVE METABOLIC PANEL
ALBUMIN: 3.2 g/dL — AB (ref 3.5–5.0)
ALK PHOS: 49 U/L (ref 40–150)
ALT: 16 U/L (ref 0–55)
ANION GAP: 9 meq/L (ref 3–11)
AST: 17 U/L (ref 5–34)
BILIRUBIN TOTAL: 0.4 mg/dL (ref 0.20–1.20)
BUN: 22.4 mg/dL (ref 7.0–26.0)
CO2: 23 mEq/L (ref 22–29)
Calcium: 9 mg/dL (ref 8.4–10.4)
Chloride: 107 mEq/L (ref 98–109)
Creatinine: 1.4 mg/dL — ABNORMAL HIGH (ref 0.7–1.3)
EGFR: 48 mL/min/{1.73_m2} — AB (ref >90)
Glucose: 106 mg/dl (ref 70–140)
Potassium: 4 mEq/L (ref 3.5–5.1)
Sodium: 139 mEq/L (ref 136–145)
TOTAL PROTEIN: 6.9 g/dL (ref 6.4–8.3)

## 2015-10-04 LAB — TECHNOLOGIST REVIEW CHCC SATELLITE: Tech Review: 2

## 2015-10-04 MED ORDER — SODIUM CHLORIDE 0.9% FLUSH
10.0000 mL | INTRAVENOUS | Status: DC | PRN
  Administered 2015-10-04: 10 mL via INTRAVENOUS
  Filled 2015-10-04: qty 10

## 2015-10-04 MED ORDER — HEPARIN SOD (PORK) LOCK FLUSH 100 UNIT/ML IV SOLN
500.0000 [IU] | Freq: Once | INTRAVENOUS | Status: AC
  Administered 2015-10-04: 500 [IU] via INTRAVENOUS
  Filled 2015-10-04: qty 5

## 2015-10-04 NOTE — Patient Instructions (Signed)
Implanted Port Insertion An implanted port is a central line that has a round shape and is placed under the skin. It is used as a long-term IV access for:   Medicines, such as chemotherapy.   Fluids.   Liquid nutrition, such as total parenteral nutrition (TPN).   Blood samples.  LET YOUR HEALTH CARE PROVIDER KNOW ABOUT:  Allergies to food or medicine.   Medicines taken, including vitamins, herbs, eye drops, creams, and over-the-counter medicines.   Any allergies to heparin.  Use of steroids (by mouth or creams).   Previous problems with anesthetics or numbing medicines.   History of bleeding problems or blood clots.   Previous surgery.   Other health problems, including diabetes and kidney problems.   Possibility of pregnancy, if this applies. RISKS AND COMPLICATIONS Generally, this is a safe procedure. However, as with any procedure, problems can occur. Possible problems include:  Damage to the blood vessel, bruising, or bleeding at the puncture site.   Infection.  Blood clot in the vessel that the port is in.  Breakdown of the skin over your port.  Very rarely a person may develop a condition called a pneumothorax, a collection of air in the chest that may cause one of the lungs to collapse. The placement of these catheters with the appropriate imaging guidance significantly decreases the risk of a pneumothorax.  BEFORE THE PROCEDURE   Your health care provider may want you to have blood tests. These tests can help tell how well your kidneys and liver are working. They can also show how well your blood clots.   If you take blood thinners (anticoagulant medicines), ask your health care provider when you should stop taking them.   Make arrangements for someone to drive you home. This is necessary if you have been sedated for your procedure.  PROCEDURE  Port insertion usually takes about 30-45 minutes.   An IV needle will be inserted in your arm.  Medicine for pain and medicine to help relax you (sedative) will flow directly into your body through this needle.   You will lie on an exam table, and you will be connected to monitors to keep track of your heart rate, blood pressure, and breathing throughout the procedure.  An oxygen monitoring device may be attached to your finger. Oxygen will be given.   Everything will be kept as germ free (sterile) as possible during the procedure. The skin near the point of the incision will be cleansed with antiseptic, and the area will be draped with sterile towels. The skin and deeper tissues over the port area will be made numb with a local anesthetic.  Two small cuts (incisions) will be made in the skin to insert the port. One will be made in the neck to obtain access to the vein where the catheter will lie.   Because the port reservoir will be placed under the skin, a small skin incision will be made in the upper chest, and a small pocket for the port will be made under the skin. The catheter that will be connected to the port tunnels to a large central vein in the chest. A small, raised area will remain on your body at the site of the reservoir when the procedure is complete.  The port placement will be done under imaging guidance to ensure the proper placement.  The reservoir has a silicone covering that can be punctured with a special needle.   The port will be flushed with normal   saline, and blood will be drawn to make sure it is working properly.  There will be nothing remaining outside the skin when the procedure is finished.   Incisions will be held together by stitches, surgical glue, or a special tape. AFTER THE PROCEDURE  You will stay in a recovery area until the anesthesia has worn off. Your blood pressure and pulse will be checked.  A final chest X-ray will be taken to check the placement of the port and to ensure that there is no injury to your lung.   This information is  not intended to replace advice given to you by your health care provider. Make sure you discuss any questions you have with your health care provider.   Document Released: 03/18/2013 Document Revised: 06/18/2014 Document Reviewed: 03/18/2013 Elsevier Interactive Patient Education 2016 Elsevier Inc.  

## 2015-10-11 ENCOUNTER — Ambulatory Visit (HOSPITAL_COMMUNITY)
Admission: RE | Admit: 2015-10-11 | Discharge: 2015-10-11 | Disposition: A | Discharge status: Home / Self Care | Payer: Medicare Other | Source: Ambulatory Visit | Attending: Hematology & Oncology | Admitting: Hematology & Oncology

## 2015-10-11 ENCOUNTER — Other Ambulatory Visit (HOSPITAL_BASED_OUTPATIENT_CLINIC_OR_DEPARTMENT_OTHER): Payer: Medicare Other

## 2015-10-11 ENCOUNTER — Ambulatory Visit (HOSPITAL_BASED_OUTPATIENT_CLINIC_OR_DEPARTMENT_OTHER): Payer: Medicare Other

## 2015-10-11 ENCOUNTER — Other Ambulatory Visit: Payer: Self-pay | Admitting: Family

## 2015-10-11 ENCOUNTER — Other Ambulatory Visit: Payer: Self-pay | Admitting: *Deleted

## 2015-10-11 VITALS — BP 111/65 | HR 105 | Temp 97.2°F | Resp 20

## 2015-10-11 DIAGNOSIS — D4621 Refractory anemia with excess of blasts 1: Secondary | ICD-10-CM | POA: Diagnosis not present

## 2015-10-11 DIAGNOSIS — Z452 Encounter for adjustment and management of vascular access device: Secondary | ICD-10-CM

## 2015-10-11 DIAGNOSIS — D4620 Refractory anemia with excess of blasts, unspecified: Secondary | ICD-10-CM

## 2015-10-11 DIAGNOSIS — D509 Iron deficiency anemia, unspecified: Secondary | ICD-10-CM

## 2015-10-11 LAB — CBC WITH DIFFERENTIAL (CANCER CENTER ONLY)
BASO#: 0 10*3/uL (ref 0.0–0.2)
BASO%: 0.8 % (ref 0.0–2.0)
EOS ABS: 0 10*3/uL (ref 0.0–0.5)
EOS%: 0.8 % (ref 0.0–7.0)
HEMATOCRIT: 25.3 % — AB (ref 38.7–49.9)
HEMOGLOBIN: 8.7 g/dL — AB (ref 13.0–17.1)
LYMPH#: 0.8 10*3/uL — AB (ref 0.9–3.3)
LYMPH%: 30.7 % (ref 14.0–48.0)
MCH: 33.1 pg (ref 28.0–33.4)
MCHC: 34.4 g/dL (ref 32.0–35.9)
MCV: 96 fL (ref 82–98)
MONO#: 0.8 10*3/uL (ref 0.1–0.9)
MONO%: 30.4 % — ABNORMAL HIGH (ref 0.0–13.0)
NEUT%: 37.3 % — ABNORMAL LOW (ref 40.0–80.0)
NEUTROS ABS: 1 10*3/uL — AB (ref 1.5–6.5)
Platelets: 149 10*3/uL (ref 145–400)
RBC: 2.63 10*6/uL — AB (ref 4.20–5.70)
RDW: 23.1 % — ABNORMAL HIGH (ref 11.1–15.7)
WBC: 2.6 10*3/uL — AB (ref 4.0–10.0)

## 2015-10-11 LAB — COMPREHENSIVE METABOLIC PANEL
ALBUMIN: 3.3 g/dL — AB (ref 3.5–5.0)
ALK PHOS: 47 U/L (ref 40–150)
ALT: 14 U/L (ref 0–55)
AST: 14 U/L (ref 5–34)
Anion Gap: 9 mEq/L (ref 3–11)
BILIRUBIN TOTAL: 0.39 mg/dL (ref 0.20–1.20)
BUN: 24.5 mg/dL (ref 7.0–26.0)
CO2: 24 mEq/L (ref 22–29)
CREATININE: 1.4 mg/dL — AB (ref 0.7–1.3)
Calcium: 9.3 mg/dL (ref 8.4–10.4)
Chloride: 106 mEq/L (ref 98–109)
EGFR: 48 mL/min/{1.73_m2} — ABNORMAL LOW (ref >90)
GLUCOSE: 113 mg/dL (ref 70–140)
Potassium: 3.8 mEq/L (ref 3.5–5.1)
SODIUM: 139 meq/L (ref 136–145)
TOTAL PROTEIN: 7.1 g/dL (ref 6.4–8.3)

## 2015-10-11 MED ORDER — SODIUM CHLORIDE 0.9% FLUSH
10.0000 mL | INTRAVENOUS | Status: DC | PRN
  Administered 2015-10-11: 10 mL via INTRAVENOUS
  Filled 2015-10-11: qty 10

## 2015-10-11 MED ORDER — HEPARIN SOD (PORK) LOCK FLUSH 100 UNIT/ML IV SOLN
500.0000 [IU] | Freq: Once | INTRAVENOUS | Status: AC
  Administered 2015-10-11: 500 [IU] via INTRAVENOUS
  Filled 2015-10-11: qty 5

## 2015-10-11 NOTE — Progress Notes (Signed)
Dr. Marin Olp notified of HGB, to receive one unit PRBCs tomorrow. Pt and wife verbalize understanding.

## 2015-10-11 NOTE — Patient Instructions (Signed)

## 2015-10-12 ENCOUNTER — Encounter: Payer: Self-pay | Admitting: Hematology & Oncology

## 2015-10-12 ENCOUNTER — Ambulatory Visit (HOSPITAL_BASED_OUTPATIENT_CLINIC_OR_DEPARTMENT_OTHER): Payer: Medicare Other

## 2015-10-12 VITALS — BP 126/56 | HR 85 | Temp 97.3°F | Resp 20

## 2015-10-12 DIAGNOSIS — D4621 Refractory anemia with excess of blasts 1: Secondary | ICD-10-CM | POA: Diagnosis not present

## 2015-10-12 DIAGNOSIS — D4620 Refractory anemia with excess of blasts, unspecified: Secondary | ICD-10-CM

## 2015-10-12 DIAGNOSIS — D509 Iron deficiency anemia, unspecified: Secondary | ICD-10-CM

## 2015-10-12 DIAGNOSIS — D46Z Other myelodysplastic syndromes: Secondary | ICD-10-CM

## 2015-10-12 LAB — PREPARE RBC (CROSSMATCH)

## 2015-10-12 MED ORDER — HEPARIN SOD (PORK) LOCK FLUSH 100 UNIT/ML IV SOLN
500.0000 [IU] | Freq: Every day | INTRAVENOUS | Status: AC | PRN
  Administered 2015-10-12: 500 [IU]
  Filled 2015-10-12: qty 5

## 2015-10-12 MED ORDER — DIPHENHYDRAMINE HCL 25 MG PO CAPS
25.0000 mg | ORAL_CAPSULE | Freq: Once | ORAL | Status: AC
  Administered 2015-10-12: 25 mg via ORAL

## 2015-10-12 MED ORDER — DIPHENHYDRAMINE HCL 25 MG PO CAPS
ORAL_CAPSULE | ORAL | Status: AC
  Filled 2015-10-12: qty 1

## 2015-10-12 MED ORDER — ACETAMINOPHEN 325 MG PO TABS
ORAL_TABLET | ORAL | Status: AC
  Filled 2015-10-12: qty 2

## 2015-10-12 MED ORDER — ACETAMINOPHEN 325 MG PO TABS
650.0000 mg | ORAL_TABLET | Freq: Once | ORAL | Status: AC
  Administered 2015-10-12: 650 mg via ORAL

## 2015-10-12 MED ORDER — SODIUM CHLORIDE 0.9 % IV SOLN
250.0000 mL | Freq: Once | INTRAVENOUS | Status: AC
  Administered 2015-10-12: 250 mL via INTRAVENOUS

## 2015-10-12 MED ORDER — SODIUM CHLORIDE 0.9% FLUSH
3.0000 mL | INTRAVENOUS | Status: AC | PRN
  Administered 2015-10-12: 10 mL
  Filled 2015-10-12: qty 10

## 2015-10-12 NOTE — Patient Instructions (Signed)

## 2015-10-13 LAB — TYPE AND SCREEN
ABO/RH(D): O POS
Antibody Screen: NEGATIVE
Unit division: 0

## 2015-10-14 ENCOUNTER — Encounter: Payer: Self-pay | Admitting: Nurse Practitioner

## 2015-10-17 ENCOUNTER — Other Ambulatory Visit: Payer: Self-pay | Admitting: *Deleted

## 2015-10-17 ENCOUNTER — Ambulatory Visit (HOSPITAL_BASED_OUTPATIENT_CLINIC_OR_DEPARTMENT_OTHER): Payer: Medicare Other | Admitting: Hematology & Oncology

## 2015-10-17 ENCOUNTER — Encounter: Payer: Self-pay | Admitting: Hematology & Oncology

## 2015-10-17 ENCOUNTER — Ambulatory Visit (HOSPITAL_BASED_OUTPATIENT_CLINIC_OR_DEPARTMENT_OTHER): Payer: Medicare Other

## 2015-10-17 ENCOUNTER — Other Ambulatory Visit (HOSPITAL_BASED_OUTPATIENT_CLINIC_OR_DEPARTMENT_OTHER): Payer: Medicare Other

## 2015-10-17 VITALS — BP 136/64 | HR 114 | Temp 97.4°F | Resp 18 | Ht 72.0 in | Wt 203.0 lb

## 2015-10-17 DIAGNOSIS — D4620 Refractory anemia with excess of blasts, unspecified: Secondary | ICD-10-CM

## 2015-10-17 DIAGNOSIS — D4621 Refractory anemia with excess of blasts 1: Secondary | ICD-10-CM

## 2015-10-17 DIAGNOSIS — F064 Anxiety disorder due to known physiological condition: Secondary | ICD-10-CM

## 2015-10-17 DIAGNOSIS — Z5111 Encounter for antineoplastic chemotherapy: Secondary | ICD-10-CM

## 2015-10-17 DIAGNOSIS — D509 Iron deficiency anemia, unspecified: Secondary | ICD-10-CM

## 2015-10-17 LAB — CBC WITH DIFFERENTIAL (CANCER CENTER ONLY)
BASO#: 0 10*3/uL (ref 0.0–0.2)
BASO%: 1.9 % (ref 0.0–2.0)
EOS%: 1 % (ref 0.0–7.0)
Eosinophils Absolute: 0 10*3/uL (ref 0.0–0.5)
HCT: 28.4 % — ABNORMAL LOW (ref 38.7–49.9)
HGB: 9.7 g/dL — ABNORMAL LOW (ref 13.0–17.1)
LYMPH#: 0.8 10*3/uL — AB (ref 0.9–3.3)
LYMPH%: 37.4 % (ref 14.0–48.0)
MCH: 33.3 pg (ref 28.0–33.4)
MCHC: 34.2 g/dL (ref 32.0–35.9)
MCV: 98 fL (ref 82–98)
MONO#: 0.4 10*3/uL (ref 0.1–0.9)
MONO%: 18.9 % — ABNORMAL HIGH (ref 0.0–13.0)
NEUT#: 0.8 10*3/uL — ABNORMAL LOW (ref 1.5–6.5)
NEUT%: 40.8 % (ref 40.0–80.0)
PLATELETS: 213 10*3/uL (ref 145–400)
RBC: 2.91 10*6/uL — AB (ref 4.20–5.70)
RDW: 22.7 % — AB (ref 11.1–15.7)
WBC: 2.1 10*3/uL — AB (ref 4.0–10.0)

## 2015-10-17 LAB — CMP (CANCER CENTER ONLY)
ALK PHOS: 45 U/L (ref 26–84)
ALT: 23 U/L (ref 10–47)
AST: 17 U/L (ref 11–38)
Albumin: 3 g/dL — ABNORMAL LOW (ref 3.3–5.5)
BILIRUBIN TOTAL: 0.8 mg/dL (ref 0.20–1.60)
BUN: 21 mg/dL (ref 7–22)
CO2: 25 mEq/L (ref 18–33)
CREATININE: 1.2 mg/dL (ref 0.6–1.2)
Calcium: 9.2 mg/dL (ref 8.0–10.3)
Chloride: 104 mEq/L (ref 98–108)
GLUCOSE: 109 mg/dL (ref 73–118)
POTASSIUM: 3.5 meq/L (ref 3.3–4.7)
Sodium: 138 mEq/L (ref 128–145)
TOTAL PROTEIN: 7.1 g/dL (ref 6.4–8.1)

## 2015-10-17 LAB — IRON AND TIBC
%SAT: 60 % — ABNORMAL HIGH (ref 20–55)
Iron: 164 ug/dL — ABNORMAL HIGH (ref 42–163)
TIBC: 271 ug/dL (ref 202–409)
UIBC: 108 ug/dL — AB (ref 117–376)

## 2015-10-17 LAB — FERRITIN: Ferritin: 2432 ng/ml — ABNORMAL HIGH (ref 22–316)

## 2015-10-17 LAB — TECHNOLOGIST REVIEW CHCC SATELLITE

## 2015-10-17 MED ORDER — DARBEPOETIN ALFA 500 MCG/ML IJ SOSY
400.0000 ug | PREFILLED_SYRINGE | Freq: Once | INTRAMUSCULAR | Status: AC
  Administered 2015-10-17: 400 ug via SUBCUTANEOUS

## 2015-10-17 MED ORDER — SODIUM CHLORIDE 0.9 % IV SOLN
Freq: Once | INTRAVENOUS | Status: DC
  Administered 2015-10-17: 09:00:00 via INTRAVENOUS
  Filled 2015-10-17: qty 4

## 2015-10-17 MED ORDER — AZACITIDINE CHEMO INJECTION 100 MG: 75.0000 mg/m2 | Freq: Every day | INTRAMUSCULAR | Status: DC

## 2015-10-17 MED ORDER — SODIUM CHLORIDE 0.9 % IV SOLN
Freq: Once | INTRAVENOUS | Status: DC
  Filled 2015-10-17: qty 4

## 2015-10-17 MED ORDER — SODIUM CHLORIDE 0.9 % IV SOLN
78.0000 mg/m2 | Freq: Once | INTRAVENOUS | Status: AC
  Administered 2015-10-17: 170 mg via INTRAVENOUS
  Filled 2015-10-17: qty 17

## 2015-10-17 MED ORDER — LORAZEPAM 0.5 MG PO TABS
0.5000 mg | ORAL_TABLET | Freq: Four times a day (QID) | ORAL | Status: DC | PRN
Start: 2015-10-17 — End: 2015-12-26

## 2015-10-17 MED ORDER — SODIUM CHLORIDE 0.9 % IV SOLN
Freq: Once | INTRAVENOUS | Status: AC
  Administered 2015-10-17: 09:00:00 via INTRAVENOUS

## 2015-10-17 MED ORDER — DARBEPOETIN ALFA 200 MCG/0.4ML IJ SOSY
PREFILLED_SYRINGE | INTRAMUSCULAR | Status: AC
  Filled 2015-10-17: qty 0.8

## 2015-10-17 MED ORDER — SODIUM CHLORIDE 0.9 % IJ SOLN
10.0000 mL | INTRAMUSCULAR | Status: DC | PRN
  Filled 2015-10-17: qty 10

## 2015-10-17 MED ORDER — HEPARIN SOD (PORK) LOCK FLUSH 100 UNIT/ML IV SOLN
500.0000 [IU] | Freq: Once | INTRAVENOUS | Status: DC | PRN
  Filled 2015-10-17: qty 5

## 2015-10-17 MED ORDER — PREDNISONE 20 MG PO TABS: 20.0000 mg | ORAL_TABLET | Freq: Every day | ORAL | Status: DC

## 2015-10-17 MED FILL — predniSONE 20 MG TABS: 20 | 30 days supply | Qty: 30 | Fill #0

## 2015-10-17 MED FILL — LORazepam 0.5 MG TABS: 0.5 | 15 days supply | Qty: 60 | Fill #0

## 2015-10-17 NOTE — Progress Notes (Signed)
Hematology and Oncology Follow Up Visit  Anthony [name] [id number] 02/26/1941 75 y.o. 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  [name] is back for follow-up. Anthony [name] was transfused last week. Anthony [name] feels a little bit better. Anthony [name] has some bounces shoes but I think this is a whole separate problem.  Anthony [name] does get fatigued on occasion.  Anthony [name]'s had no bleeding. Anthony [name]'s had no fever. Anthony [name] appetite is doing a little bit better. Anthony [name] is on low-dose prednisone which I think is helping. Anthony [name] blood sugars have managed low-dose prednisone so far.  Anthony [name]'s had no rashes. Anthony [name]'s had no leg swelling.  There's been no change in bowel or bladder habits.  Anthony [name] last iron studies showed a ferritin of 2100. Anthony [name] iron saturation was 82%. We are going to have to clearly be cautious and possibly get him on iron chelation therapy.  Overall, Anthony [name] performance status is ECOG 2   Medications:  Current outpatient prescriptions:  .  Albuterol Sulfate (PROAIR RESPICLICK) 381 (90 BASE) MCG/ACT AEPB, Inhale 2 puffs into the lungs every 6 (six) hours as needed., Disp: , Rfl:  .  bimatoprost (LUMIGAN) 0.01 % SOLN, Place 1 drop into both eyes at bedtime., Disp: , Rfl:  .  budesonide-formoterol (SYMBICORT) 160-4.5 MCG/ACT inhaler, Inhale 2 puffs into the lungs 2 (two) times daily., Disp: 3 Inhaler, Rfl: 3 .  buPROPion (WELLBUTRIN XL) 150 MG 24 hr tablet, Take 150 mg by mouth daily., Disp: , Rfl:  .  Cholecalciferol (VITAMIN D3) 5000 UNITS TABS, Take by mouth daily., Disp: , Rfl:  .  clopidogrel (PLAVIX) 75 MG tablet, Take 75 mg by mouth daily. , Disp: , Rfl:  .  co-enzyme Q-10 30 MG capsule, Take 100 mg by mouth daily., Disp: , Rfl:  .  Cyanocobalamin (VITAMIN B-12) 2500 MCG TABS, Take 5,000 mcg by mouth daily., Disp: 90 tablet, Rfl: 9 .   fish oil-omega-3 fatty acids 1000 MG capsule, Take 1 g by mouth daily., Disp: , Rfl:  .  Flaxseed, Linseed, (FLAX SEED OIL) 1000 MG CAPS, Take 1,000 mg by mouth daily., Disp: , Rfl:  .  LORazepam (ATIVAN) 0.5 MG tablet, Take 1 tablet (0.5 mg total) by mouth every 6 (six) hours as needed (Nausea or vomiting)., Disp: 60 tablet, Rfl: 0 .  ondansetron (ZOFRAN) 8 MG tablet, Take 1 tablet (8 mg total) by mouth 2 (two) times daily. Start the day after chemo for 2 days. Then take as needed for nausea or vomiting., Disp: 30 tablet, Rfl: 1 .  pantoprazole (PROTONIX) 40 MG tablet, Take 40 mg by mouth daily., Disp: , Rfl:  .  predniSONE (DELTASONE) 20 MG tablet, Take 1 tablet (20 mg total) by mouth daily with breakfast., Disp: 30 tablet, Rfl: 6 .  prochlorperazine (COMPAZINE) 10 MG tablet, Take 1 tablet (10 mg total) by mouth every 6 (six) hours as needed (Nausea or vomiting)., Disp: 30 tablet, Rfl: 1 .  ranitidine (ZANTAC) 300 MG tablet, Take 300 mg by mouth at bedtime. , Disp: , Rfl:  .  sertraline (ZOLOFT) 100 MG tablet, Take 100 mg by mouth daily., Disp: , Rfl: 1 .  SIMBRINZA 1-0.2 % SUSP, , Disp: , Rfl:  .  simvastatin (ZOCOR) 40 MG tablet, Take 40 mg by mouth every  other day. , Disp: , Rfl:  .  tiotropium (SPIRIVA) 18 MCG inhalation capsule, Place 18 mcg into inhaler and inhale daily., Disp: , Rfl:   Allergies: No Known Allergies  Past Medical History, Surgical history, Social history, and Family History were reviewed and updated.  Review of Systems: As above  Physical Exam:  height is 6' (1.829 m) and weight is 203 lb (92.08 kg). Anthony [name] oral temperature is 97.4 F (36.3 C). Anthony [name] blood pressure is 136/64 and Anthony [name] pulse is 114. Anthony [name] respiration is 18.   Well-developed and well-nourished white general. Head and neck exam shows no ocular or oral lesions. Anthony [name] has no scleral icterus. Anthony [name] has no adenopathy in the neck. Lungs are clear. Cardiac exam regular rate and rhythm with no murmurs, rubs or bruits. Abdomen  is soft. Anthony [name] has good bowel sounds. Anthony [name]'s mildly obese. Anthony [name] has no fluid wave. There is no palpable liver or spleen tip. Back exam shows no tenderness over the spine, ribs or hips. Extremities shows no clubbing, cyanosis or edema. Skin exam no rashes, ecchymoses or petechia.  Lab Results  Component Value Date   WBC 2.1* 10/17/2015   HGB 9.7* 10/17/2015   HCT 28.4* 10/17/2015   MCV 98 10/17/2015   PLT 213 10/17/2015     Chemistry      Component Value Date/Time   NA 138 10/17/2015 0806   NA 139 10/11/2015 1053   NA 141 06/17/2015 1155   K 3.5 10/17/2015 0806   K 3.8 10/11/2015 1053   K 4.4 06/17/2015 1155   CL 104 10/17/2015 0806   CL 107 06/17/2015 1155   CO2 25 10/17/2015 0806   CO2 24 10/11/2015 1053   CO2 25 06/17/2015 1155   BUN 21 10/17/2015 0806   BUN 24.5 10/11/2015 1053   BUN 27* 06/17/2015 1155   CREATININE 1.2 10/17/2015 0806   CREATININE 1.4* 10/11/2015 1053   CREATININE 1.48* 06/17/2015 1155      Component Value Date/Time   CALCIUM 9.2 10/17/2015 0806   CALCIUM 9.3 10/11/2015 1053   CALCIUM 9.0 06/17/2015 1155   ALKPHOS 45 10/17/2015 0806   ALKPHOS 47 10/11/2015 1053   ALKPHOS 36* 10/26/2014 1111   AST 17 10/17/2015 0806   AST 14 10/11/2015 1053   AST 19 10/26/2014 1111   ALT 23 10/17/2015 0806   ALT 14 10/11/2015 1053   ALT 15 10/26/2014 1111   BILITOT 0.80 10/17/2015 0806   BILITOT 0.39 10/11/2015 1053   BILITOT 0.4 10/26/2014 1111         Impression and Plan: Anthony [name] is 75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   We'll go ahead and start Anthony [name] treatment this week. This wheeze fifth cycle of treatment.  I'll set him up with a bone marrow biopsy and aspirate on June 6. This will tell us if we are making any progress. If we are not making progress, then we may had to switch over to decitabine.  Anthony [name] still gets Anthony [name] labs every week. We are transfusing him every 2-3 weeks. We will have to watch out with iron overload.   I spent about 30 minutes with  Anthony [name] and Anthony [name] wife today.      Volanda Napoleon, MD 5/8/20178:57 AM

## 2015-10-17 NOTE — Patient Instructions (Signed)
Azacitidine suspension for injection (subcutaneous use) What is this medicine? AZACITIDINE (ay za SITE i deen) is a chemotherapy drug. This medicine reduces the growth of cancer cells and can suppress the immune system. It is used for treating myelodysplastic syndrome or some types of leukemia. This medicine may be used for other purposes; ask your health care provider or pharmacist if you have questions. What should I tell my health care provider before I take this medicine? They need to know if you have any of these conditions: -infection (especially a virus infection such as chickenpox, cold sores, or herpes) -kidney disease -liver disease -liver tumors -an unusual or allergic reaction to azacitidine, mannitol, other medicines, foods, dyes, or preservatives -pregnant or trying to get pregnant -breast-feeding How should I use this medicine? This medicine is for injection under the skin. It is administered in a hospital or clinic by a specially trained health care professional. Talk to your pediatrician regarding the use of this medicine in children. While this drug may be prescribed for selected conditions, precautions do apply. Overdosage: If you think you have taken too much of this medicine contact a poison control center or emergency room at once. NOTE: This medicine is only for you. Do not share this medicine with others. What if I miss a dose? It is important not to miss your dose. Call your doctor or health care professional if you are unable to keep an appointment. What may interact with this medicine? Interactions have not been studied. Give your health care provider a list of all the medicines, herbs, non-prescription drugs, or dietary supplements you use. Also tell them if you smoke, drink alcohol, or use illegal drugs. Some items may interact with your medicine. This list may not describe all possible interactions. Give your health care provider a list of all the medicines,  herbs, non-prescription drugs, or dietary supplements you use. Also tell them if you smoke, drink alcohol, or use illegal drugs. Some items may interact with your medicine. What should I watch for while using this medicine? Visit your doctor for checks on your progress. This drug may make you feel generally unwell. This is not uncommon, as chemotherapy can affect healthy cells as well as cancer cells. Report any side effects. Continue your course of treatment even though you feel ill unless your doctor tells you to stop. In some cases, you may be given additional medicines to help with side effects. Follow all directions for their use. Call your doctor or health care professional for advice if you get a fever, chills or sore throat, or other symptoms of a cold or flu. Do not treat yourself. This drug decreases your body's ability to fight infections. Try to avoid being around people who are sick. This medicine may increase your risk to bruise or bleed. Call your doctor or health care professional if you notice any unusual bleeding. Do not have any vaccinations without your doctor's approval and avoid anyone who has recently had oral polio vaccine. Do not become pregnant while taking this medicine. Women should inform their doctor if they wish to become pregnant or think they might be pregnant. There is a potential for serious side effects to an unborn child. Talk to your health care professional or pharmacist for more information. Do not breast-feed an infant while taking this medicine. If you are a man, you should not father a child while receiving treatment. What side effects may I notice from receiving this medicine? Side effects that you should report   to your doctor or health care professional as soon as possible: -allergic reactions like skin rash, itching or hives, swelling of the face, lips, or tongue -low blood counts - this medicine may decrease the number of white blood cells, red blood cells  and platelets. You may be at increased risk for infections and bleeding. -signs of infection - fever or chills, cough, sore throat, pain or difficulty passing urine -signs of decreased platelets or bleeding - bruising, pinpoint red spots on the skin, black, tarry stools, blood in the urine -signs of decreased red blood cells - unusually weak or tired, fainting spells, lightheadedness -reactions at the injection site including redness, pain, itching, or bruising -breathing problems -changes in vision -fever -mouth sores -stomach pain -vomiting Side effects that usually do not require medical attention (report to your doctor or health care professional if they continue or are bothersome): -constipation -diarrhea -loss of appetite -nausea -pain or redness at the injection site -weak or tired This list may not describe all possible side effects. Call your doctor for medical advice about side effects. You may report side effects to FDA at 1-800-FDA-1088. Where should I keep my medicine? This drug is given in a hospital or clinic and will not be stored at home. NOTE: This sheet is a summary. It may not cover all possible information. If you have questions about this medicine, talk to your doctor, pharmacist, or health care provider.    2016, [employer]. (2014-02-19 18:13:53)  

## 2015-10-18 ENCOUNTER — Ambulatory Visit (HOSPITAL_BASED_OUTPATIENT_CLINIC_OR_DEPARTMENT_OTHER): Payer: Medicare Other

## 2015-10-18 VITALS — BP 116/58 | HR 91 | Temp 98.2°F | Resp 18

## 2015-10-18 DIAGNOSIS — D4621 Refractory anemia with excess of blasts 1: Secondary | ICD-10-CM

## 2015-10-18 DIAGNOSIS — Z5111 Encounter for antineoplastic chemotherapy: Secondary | ICD-10-CM

## 2015-10-18 DIAGNOSIS — D4620 Refractory anemia with excess of blasts, unspecified: Secondary | ICD-10-CM

## 2015-10-18 LAB — RETICULOCYTES: Reticulocyte Count: 3.3 % — ABNORMAL HIGH (ref 0.6–2.6)

## 2015-10-18 MED ORDER — SODIUM CHLORIDE 0.9 % IV SOLN
Freq: Once | INTRAVENOUS | Status: AC
  Administered 2015-10-18: 8 mg via INTRAVENOUS
  Filled 2015-10-18: qty 4

## 2015-10-18 MED ORDER — SODIUM CHLORIDE 0.9 % IV SOLN
Freq: Once | INTRAVENOUS | Status: AC
  Administered 2015-10-18: 09:00:00 via INTRAVENOUS

## 2015-10-18 MED ORDER — SODIUM CHLORIDE 0.9 % IV SOLN
78.0000 mg/m2 | Freq: Once | INTRAVENOUS | Status: AC
  Administered 2015-10-18: 170 mg via INTRAVENOUS
  Filled 2015-10-18: qty 17

## 2015-10-18 MED ORDER — HEPARIN SOD (PORK) LOCK FLUSH 100 UNIT/ML IV SOLN
500.0000 [IU] | Freq: Once | INTRAVENOUS | Status: AC | PRN
  Administered 2015-10-18: 500 [IU]
  Filled 2015-10-18: qty 5

## 2015-10-18 MED ORDER — SODIUM CHLORIDE 0.9% FLUSH
10.0000 mL | INTRAVENOUS | Status: DC | PRN
  Administered 2015-10-18: 10 mL
  Filled 2015-10-18: qty 10

## 2015-10-18 NOTE — Patient Instructions (Signed)
[facility] Discharge Instructions for Patients Receiving Chemotherapy  Today you received the following chemotherapy agents Vidaza  To help prevent nausea and vomiting after your treatment, we encourage you to take your nausea medication   If you develop nausea and vomiting that is not controlled by your nausea medication, call the clinic.   BELOW ARE SYMPTOMS THAT SHOULD BE REPORTED IMMEDIATELY:  *FEVER GREATER THAN 100.5 F  *CHILLS WITH OR WITHOUT FEVER  NAUSEA AND VOMITING THAT IS NOT CONTROLLED WITH YOUR NAUSEA MEDICATION  *UNUSUAL SHORTNESS OF BREATH  *UNUSUAL BRUISING OR BLEEDING  TENDERNESS IN MOUTH AND THROAT WITH OR WITHOUT PRESENCE OF ULCERS  *URINARY PROBLEMS  *BOWEL PROBLEMS  UNUSUAL RASH Items with * indicate a potential emergency and should be followed up as soon as possible.  Feel free to call the clinic you have any questions or concerns. The clinic phone number is [phone].  Please show the CHEMO ALERT CARD at check-in to the Emergency Department and triage nurse.   

## 2015-10-19 ENCOUNTER — Ambulatory Visit: Payer: Medicare Other | Admitting: Hematology & Oncology

## 2015-10-19 ENCOUNTER — Ambulatory Visit: Payer: Medicare Other

## 2015-10-19 ENCOUNTER — Other Ambulatory Visit: Payer: Medicare Other

## 2015-10-19 ENCOUNTER — Ambulatory Visit (HOSPITAL_BASED_OUTPATIENT_CLINIC_OR_DEPARTMENT_OTHER): Payer: Medicare Other

## 2015-10-19 VITALS — BP 114/59 | HR 73 | Temp 97.4°F | Resp 20

## 2015-10-19 DIAGNOSIS — D4621 Refractory anemia with excess of blasts 1: Secondary | ICD-10-CM

## 2015-10-19 DIAGNOSIS — Z5111 Encounter for antineoplastic chemotherapy: Secondary | ICD-10-CM

## 2015-10-19 DIAGNOSIS — D4620 Refractory anemia with excess of blasts, unspecified: Secondary | ICD-10-CM

## 2015-10-19 MED ORDER — SODIUM CHLORIDE 0.9 % IV SOLN
Freq: Once | INTRAVENOUS | Status: AC
  Administered 2015-10-19: 10:00:00 via INTRAVENOUS
  Filled 2015-10-19: qty 4

## 2015-10-19 MED ORDER — HEPARIN SOD (PORK) LOCK FLUSH 100 UNIT/ML IV SOLN
500.0000 [IU] | Freq: Once | INTRAVENOUS | Status: AC | PRN
  Administered 2015-10-19: 500 [IU]
  Filled 2015-10-19: qty 5

## 2015-10-19 MED ORDER — AZACITIDINE CHEMO INJECTION 100 MG
78.0000 mg/m2 | Freq: Once | INTRAMUSCULAR | Status: AC
Start: 2015-10-20 — End: 2015-10-19
  Administered 2015-10-19: 170 mg via INTRAVENOUS
  Filled 2015-10-19: qty 17

## 2015-10-19 MED ORDER — SODIUM CHLORIDE 0.9 % IV SOLN
Freq: Once | INTRAVENOUS | Status: AC
  Administered 2015-10-19: 10:00:00 via INTRAVENOUS

## 2015-10-19 MED ORDER — SODIUM CHLORIDE 0.9% FLUSH
10.0000 mL | INTRAVENOUS | Status: DC | PRN
  Administered 2015-10-19: 10 mL
  Filled 2015-10-19: qty 10

## 2015-10-19 NOTE — Patient Instructions (Signed)
[facility] Discharge Instructions for Patients Receiving Chemotherapy  Today you received the following chemotherapy agents Vidaza  To help prevent nausea and vomiting after your treatment, we encourage you to take your nausea medication   If you develop nausea and vomiting that is not controlled by your nausea medication, call the clinic.   BELOW ARE SYMPTOMS THAT SHOULD BE REPORTED IMMEDIATELY:  *FEVER GREATER THAN 100.5 F  *CHILLS WITH OR WITHOUT FEVER  NAUSEA AND VOMITING THAT IS NOT CONTROLLED WITH YOUR NAUSEA MEDICATION  *UNUSUAL SHORTNESS OF BREATH  *UNUSUAL BRUISING OR BLEEDING  TENDERNESS IN MOUTH AND THROAT WITH OR WITHOUT PRESENCE OF ULCERS  *URINARY PROBLEMS  *BOWEL PROBLEMS  UNUSUAL RASH Items with * indicate a potential emergency and should be followed up as soon as possible.  Feel free to call the clinic you have any questions or concerns. The clinic phone number is [phone].  Please show the CHEMO ALERT CARD at check-in to the Emergency Department and triage nurse.   

## 2015-10-20 ENCOUNTER — Encounter: Payer: Self-pay | Admitting: Pharmacist

## 2015-10-20 ENCOUNTER — Ambulatory Visit (HOSPITAL_BASED_OUTPATIENT_CLINIC_OR_DEPARTMENT_OTHER): Payer: Medicare Other

## 2015-10-20 VITALS — BP 104/50 | HR 86 | Temp 97.2°F | Resp 16

## 2015-10-20 DIAGNOSIS — D4620 Refractory anemia with excess of blasts, unspecified: Secondary | ICD-10-CM

## 2015-10-20 MED ORDER — SODIUM CHLORIDE 0.9 % IV SOLN
Freq: Once | INTRAVENOUS | Status: DC
  Administered 2015-10-20: 8 mg via INTRAVENOUS
  Filled 2015-10-20: qty 4

## 2015-10-20 MED ORDER — SODIUM CHLORIDE 0.9 % IV SOLN
78.0000 mg/m2 | Freq: Once | INTRAVENOUS | Status: AC
  Administered 2015-10-20: 170 mg via INTRAVENOUS
  Filled 2015-10-20: qty 17

## 2015-10-20 MED ORDER — HEPARIN SOD (PORK) LOCK FLUSH 100 UNIT/ML IV SOLN
500.0000 [IU] | Freq: Once | INTRAVENOUS | Status: AC | PRN
  Administered 2015-10-20: 500 [IU]
  Filled 2015-10-20: qty 5

## 2015-10-20 MED ORDER — SODIUM CHLORIDE 0.9% FLUSH
10.0000 mL | INTRAVENOUS | Status: DC | PRN
  Administered 2015-10-20: 10 mL
  Filled 2015-10-20: qty 10

## 2015-10-20 MED ORDER — SODIUM CHLORIDE 0.9 % IV SOLN
Freq: Once | INTRAVENOUS | Status: DC
  Filled 2015-10-20: qty 4

## 2015-10-20 MED ORDER — SODIUM CHLORIDE 0.9 % IV SOLN
Freq: Once | INTRAVENOUS | Status: AC
  Administered 2015-10-20: 10:00:00 via INTRAVENOUS

## 2015-10-20 NOTE — Patient Instructions (Signed)
[facility] Discharge Instructions for Patients Receiving Chemotherapy  Today you received the following chemotherapy agents Vidaza  To help prevent nausea and vomiting after your treatment, we encourage you to take your nausea medication   If you develop nausea and vomiting that is not controlled by your nausea medication, call the clinic.   BELOW ARE SYMPTOMS THAT SHOULD BE REPORTED IMMEDIATELY:  *FEVER GREATER THAN 100.5 F  *CHILLS WITH OR WITHOUT FEVER  NAUSEA AND VOMITING THAT IS NOT CONTROLLED WITH YOUR NAUSEA MEDICATION  *UNUSUAL SHORTNESS OF BREATH  *UNUSUAL BRUISING OR BLEEDING  TENDERNESS IN MOUTH AND THROAT WITH OR WITHOUT PRESENCE OF ULCERS  *URINARY PROBLEMS  *BOWEL PROBLEMS  UNUSUAL RASH Items with * indicate a potential emergency and should be followed up as soon as possible.  Feel free to call the clinic you have any questions or concerns. The clinic phone number is [phone].  Please show the CHEMO ALERT CARD at check-in to the Emergency Department and triage nurse.   

## 2015-10-21 ENCOUNTER — Ambulatory Visit (HOSPITAL_BASED_OUTPATIENT_CLINIC_OR_DEPARTMENT_OTHER): Payer: Medicare Other

## 2015-10-21 VITALS — BP 144/62 | HR 96 | Temp 97.8°F

## 2015-10-21 DIAGNOSIS — D4621 Refractory anemia with excess of blasts 1: Secondary | ICD-10-CM

## 2015-10-21 DIAGNOSIS — Z5111 Encounter for antineoplastic chemotherapy: Secondary | ICD-10-CM

## 2015-10-21 DIAGNOSIS — D4620 Refractory anemia with excess of blasts, unspecified: Secondary | ICD-10-CM

## 2015-10-21 MED ORDER — SODIUM CHLORIDE 0.9 % IV SOLN
Freq: Once | INTRAVENOUS | Status: AC
  Administered 2015-10-21: 10:00:00 via INTRAVENOUS

## 2015-10-21 MED ORDER — SODIUM CHLORIDE 0.9 % IV SOLN
Freq: Once | INTRAVENOUS | Status: AC
  Administered 2015-10-21: 10:00:00 via INTRAVENOUS
  Filled 2015-10-21: qty 4

## 2015-10-21 MED ORDER — AZACITIDINE CHEMO INJECTION 100 MG
78.0000 mg/m2 | Freq: Once | INTRAMUSCULAR | Status: AC
  Administered 2015-10-21: 170 mg via INTRAVENOUS
  Filled 2015-10-21: qty 17

## 2015-10-21 MED ORDER — SODIUM CHLORIDE 0.9% FLUSH
10.0000 mL | INTRAVENOUS | Status: DC | PRN
  Administered 2015-10-21: 10 mL
  Filled 2015-10-21: qty 10

## 2015-10-21 MED ORDER — HEPARIN SOD (PORK) LOCK FLUSH 100 UNIT/ML IV SOLN
500.0000 [IU] | Freq: Once | INTRAVENOUS | Status: AC | PRN
  Administered 2015-10-21: 500 [IU]
  Filled 2015-10-21: qty 5

## 2015-10-21 NOTE — Patient Instructions (Signed)
[facility] Discharge Instructions for Patients Receiving Chemotherapy  Today you received the following chemotherapy agents Azacytidine  To help prevent nausea and vomiting after your treatment, we encourage you to take your nausea medication    If you develop nausea and vomiting that is not controlled by your nausea medication, call the clinic.   BELOW ARE SYMPTOMS THAT SHOULD BE REPORTED IMMEDIATELY:  *FEVER GREATER THAN 100.5 F  *CHILLS WITH OR WITHOUT FEVER  NAUSEA AND VOMITING THAT IS NOT CONTROLLED WITH YOUR NAUSEA MEDICATION  *UNUSUAL SHORTNESS OF BREATH  *UNUSUAL BRUISING OR BLEEDING  TENDERNESS IN MOUTH AND THROAT WITH OR WITHOUT PRESENCE OF ULCERS  *URINARY PROBLEMS  *BOWEL PROBLEMS  UNUSUAL RASH Items with * indicate a potential emergency and should be followed up as soon as possible.  Feel free to call the clinic you have any questions or concerns. The clinic phone number is [phone].  Please show the CHEMO ALERT CARD at check-in to the Emergency Department and triage nurse.   

## 2015-10-24 ENCOUNTER — Ambulatory Visit (HOSPITAL_BASED_OUTPATIENT_CLINIC_OR_DEPARTMENT_OTHER): Payer: Medicare Other

## 2015-10-24 ENCOUNTER — Other Ambulatory Visit (HOSPITAL_BASED_OUTPATIENT_CLINIC_OR_DEPARTMENT_OTHER): Payer: Medicare Other

## 2015-10-24 VITALS — BP 141/69 | HR 97 | Resp 16

## 2015-10-24 DIAGNOSIS — D4621 Refractory anemia with excess of blasts 1: Secondary | ICD-10-CM

## 2015-10-24 DIAGNOSIS — D4620 Refractory anemia with excess of blasts, unspecified: Secondary | ICD-10-CM

## 2015-10-24 DIAGNOSIS — Z452 Encounter for adjustment and management of vascular access device: Secondary | ICD-10-CM

## 2015-10-24 DIAGNOSIS — F064 Anxiety disorder due to known physiological condition: Secondary | ICD-10-CM

## 2015-10-24 LAB — CBC WITH DIFFERENTIAL (CANCER CENTER ONLY)
BASO#: 0 10*3/uL (ref 0.0–0.2)
BASO%: 0.9 % (ref 0.0–2.0)
EOS ABS: 0 10*3/uL (ref 0.0–0.5)
EOS%: 0.6 % (ref 0.0–7.0)
HCT: 28.3 % — ABNORMAL LOW (ref 38.7–49.9)
HGB: 9.4 g/dL — ABNORMAL LOW (ref 13.0–17.1)
LYMPH#: 1 10*3/uL (ref 0.9–3.3)
LYMPH%: 30.8 % (ref 14.0–48.0)
MCH: 33.8 pg — ABNORMAL HIGH (ref 28.0–33.4)
MCHC: 33.2 g/dL (ref 32.0–35.9)
MCV: 102 fL — ABNORMAL HIGH (ref 82–98)
MONO#: 0.2 10*3/uL (ref 0.1–0.9)
MONO%: 6.2 % (ref 0.0–13.0)
NEUT%: 61.5 % (ref 40.0–80.0)
NEUTROS ABS: 2 10*3/uL (ref 1.5–6.5)
PLATELETS: 186 10*3/uL (ref 145–400)
RBC: 2.78 10*6/uL — AB (ref 4.20–5.70)
RDW: 24.6 % — ABNORMAL HIGH (ref 11.1–15.7)
WBC: 3.3 10*3/uL — AB (ref 4.0–10.0)

## 2015-10-24 LAB — CMP (CANCER CENTER ONLY)
ALK PHOS: 44 U/L (ref 26–84)
ALT: 25 U/L (ref 10–47)
AST: 19 U/L (ref 11–38)
Albumin: 3 g/dL — ABNORMAL LOW (ref 3.3–5.5)
BUN: 20 mg/dL (ref 7–22)
CO2: 26 mEq/L (ref 18–33)
CREATININE: 1.6 mg/dL — AB (ref 0.6–1.2)
Calcium: 9.1 mg/dL (ref 8.0–10.3)
Chloride: 102 mEq/L (ref 98–108)
GLUCOSE: 111 mg/dL (ref 73–118)
Potassium: 3.3 mEq/L (ref 3.3–4.7)
SODIUM: 139 meq/L (ref 128–145)
TOTAL PROTEIN: 6.5 g/dL (ref 6.4–8.1)
Total Bilirubin: 0.6 mg/dl (ref 0.20–1.60)

## 2015-10-24 LAB — TECHNOLOGIST REVIEW CHCC SATELLITE

## 2015-10-24 MED ORDER — HEPARIN SOD (PORK) LOCK FLUSH 100 UNIT/ML IV SOLN
500.0000 [IU] | Freq: Once | INTRAVENOUS | Status: AC
  Administered 2015-10-24: 500 [IU] via INTRAVENOUS
  Filled 2015-10-24: qty 5

## 2015-10-24 MED ORDER — SODIUM CHLORIDE 0.9% FLUSH
10.0000 mL | INTRAVENOUS | Status: DC | PRN
  Administered 2015-10-24: 10 mL via INTRAVENOUS
  Filled 2015-10-24: qty 10

## 2015-10-24 NOTE — Patient Instructions (Signed)

## 2015-10-24 NOTE — Progress Notes (Signed)
Dr. Marin Olp reviewed labs, no need for transfusion today. Pt and wife notified and copy of lab work given.

## 2015-10-28 ENCOUNTER — Other Ambulatory Visit: Payer: Self-pay | Admitting: *Deleted

## 2015-10-28 DIAGNOSIS — D4620 Refractory anemia with excess of blasts, unspecified: Secondary | ICD-10-CM

## 2015-10-31 ENCOUNTER — Encounter: Payer: Self-pay | Admitting: *Deleted

## 2015-10-31 ENCOUNTER — Other Ambulatory Visit (HOSPITAL_BASED_OUTPATIENT_CLINIC_OR_DEPARTMENT_OTHER): Payer: Medicare Other

## 2015-10-31 ENCOUNTER — Ambulatory Visit (HOSPITAL_BASED_OUTPATIENT_CLINIC_OR_DEPARTMENT_OTHER): Payer: Medicare Other

## 2015-10-31 VITALS — BP 121/62 | HR 108 | Temp 97.8°F | Resp 16

## 2015-10-31 DIAGNOSIS — D4621 Refractory anemia with excess of blasts 1: Secondary | ICD-10-CM

## 2015-10-31 DIAGNOSIS — D4620 Refractory anemia with excess of blasts, unspecified: Secondary | ICD-10-CM

## 2015-10-31 DIAGNOSIS — Z95828 Presence of other vascular implants and grafts: Secondary | ICD-10-CM

## 2015-10-31 LAB — CMP (CANCER CENTER ONLY)
ALBUMIN: 3 g/dL — AB (ref 3.3–5.5)
ALK PHOS: 51 U/L (ref 26–84)
ALT: 28 U/L (ref 10–47)
AST: 24 U/L (ref 11–38)
BUN, Bld: 20 mg/dL (ref 7–22)
CHLORIDE: 103 meq/L (ref 98–108)
CO2: 23 mEq/L (ref 18–33)
CREATININE: 1.3 mg/dL — AB (ref 0.6–1.2)
Calcium: 9.2 mg/dL (ref 8.0–10.3)
Glucose, Bld: 102 mg/dL (ref 73–118)
Potassium: 3.7 mEq/L (ref 3.3–4.7)
SODIUM: 138 meq/L (ref 128–145)
TOTAL PROTEIN: 6.9 g/dL (ref 6.4–8.1)
Total Bilirubin: 0.7 mg/dl (ref 0.20–1.60)

## 2015-10-31 LAB — CBC WITH DIFFERENTIAL (CANCER CENTER ONLY)
BASO#: 0.1 10*3/uL (ref 0.0–0.2)
BASO%: 1.8 % (ref 0.0–2.0)
EOS ABS: 0 10*3/uL (ref 0.0–0.5)
EOS%: 0.3 % (ref 0.0–7.0)
HEMATOCRIT: 26.3 % — AB (ref 38.7–49.9)
HEMOGLOBIN: 9 g/dL — AB (ref 13.0–17.1)
LYMPH#: 0.8 10*3/uL — AB (ref 0.9–3.3)
LYMPH%: 23.7 % (ref 14.0–48.0)
MCH: 34.4 pg — AB (ref 28.0–33.4)
MCHC: 34.2 g/dL (ref 32.0–35.9)
MCV: 100 fL — ABNORMAL HIGH (ref 82–98)
MONO#: 0.5 10*3/uL (ref 0.1–0.9)
MONO%: 14.2 % — ABNORMAL HIGH (ref 0.0–13.0)
NEUT%: 60 % (ref 40.0–80.0)
NEUTROS ABS: 2 10*3/uL (ref 1.5–6.5)
Platelets: 93 10*3/uL — ABNORMAL LOW (ref 145–400)
RBC: 2.62 10*6/uL — ABNORMAL LOW (ref 4.20–5.70)
RDW: 24.5 % — ABNORMAL HIGH (ref 11.1–15.7)
WBC: 3.3 10*3/uL — ABNORMAL LOW (ref 4.0–10.0)

## 2015-10-31 LAB — TECHNOLOGIST REVIEW CHCC SATELLITE

## 2015-10-31 MED ORDER — HEPARIN SOD (PORK) LOCK FLUSH 100 UNIT/ML IV SOLN
500.0000 [IU] | Freq: Once | INTRAVENOUS | Status: AC
  Administered 2015-10-31: 500 [IU] via INTRAVENOUS
  Filled 2015-10-31: qty 5

## 2015-10-31 MED ORDER — SODIUM CHLORIDE 0.9% FLUSH
10.0000 mL | INTRAVENOUS | Status: DC | PRN
  Administered 2015-10-31: 10 mL via INTRAVENOUS
  Filled 2015-10-31: qty 10

## 2015-10-31 NOTE — Progress Notes (Signed)
Labs reviewed with Dr Marin Olp. No orders for blood products at this time. dph

## 2015-10-31 NOTE — Patient Instructions (Signed)

## 2015-11-04 ENCOUNTER — Other Ambulatory Visit: Payer: Self-pay | Admitting: *Deleted

## 2015-11-04 DIAGNOSIS — D4620 Refractory anemia with excess of blasts, unspecified: Secondary | ICD-10-CM

## 2015-11-08 ENCOUNTER — Other Ambulatory Visit: Payer: Self-pay | Admitting: Family

## 2015-11-08 ENCOUNTER — Other Ambulatory Visit (HOSPITAL_BASED_OUTPATIENT_CLINIC_OR_DEPARTMENT_OTHER): Payer: Medicare Other

## 2015-11-08 ENCOUNTER — Ambulatory Visit: Payer: Medicare Other

## 2015-11-08 VITALS — BP 114/54 | HR 102 | Temp 97.4°F | Resp 20

## 2015-11-08 DIAGNOSIS — D4620 Refractory anemia with excess of blasts, unspecified: Secondary | ICD-10-CM

## 2015-11-08 DIAGNOSIS — D4621 Refractory anemia with excess of blasts 1: Secondary | ICD-10-CM | POA: Diagnosis not present

## 2015-11-08 LAB — COMPREHENSIVE METABOLIC PANEL
ALT: 13 U/L (ref 0–55)
AST: 15 U/L (ref 5–34)
Albumin: 3.2 g/dL — ABNORMAL LOW (ref 3.5–5.0)
Alkaline Phosphatase: 45 U/L (ref 40–150)
Anion Gap: 9 mEq/L (ref 3–11)
BUN: 21.9 mg/dL (ref 7.0–26.0)
CHLORIDE: 108 meq/L (ref 98–109)
CO2: 24 meq/L (ref 22–29)
Calcium: 9.3 mg/dL (ref 8.4–10.4)
Creatinine: 1.4 mg/dL — ABNORMAL HIGH (ref 0.7–1.3)
EGFR: 49 mL/min/{1.73_m2} — AB (ref >90)
GLUCOSE: 115 mg/dL (ref 70–140)
POTASSIUM: 3.9 meq/L (ref 3.5–5.1)
SODIUM: 140 meq/L (ref 136–145)
Total Bilirubin: 0.48 mg/dL (ref 0.20–1.20)
Total Protein: 7 g/dL (ref 6.4–8.3)

## 2015-11-08 LAB — CBC WITH DIFFERENTIAL (CANCER CENTER ONLY)
BASO#: 0.1 10*3/uL (ref 0.0–0.2)
BASO%: 2.2 % — ABNORMAL HIGH (ref 0.0–2.0)
EOS%: 0.4 % (ref 0.0–7.0)
Eosinophils Absolute: 0 10*3/uL (ref 0.0–0.5)
HCT: 23.9 % — ABNORMAL LOW (ref 38.7–49.9)
HEMOGLOBIN: 8.1 g/dL — AB (ref 13.0–17.1)
LYMPH#: 0.5 10*3/uL — ABNORMAL LOW (ref 0.9–3.3)
LYMPH%: 23.5 % (ref 14.0–48.0)
MCH: 35.1 pg — AB (ref 28.0–33.4)
MCHC: 33.9 g/dL (ref 32.0–35.9)
MCV: 104 fL — ABNORMAL HIGH (ref 82–98)
MONO#: 0.7 10*3/uL (ref 0.1–0.9)
MONO%: 28.3 % — AB (ref 0.0–13.0)
NEUT%: 45.6 % (ref 40.0–80.0)
NEUTROS ABS: 1.1 10*3/uL — AB (ref 1.5–6.5)
PLATELETS: 169 10*3/uL (ref 145–400)
RBC: 2.31 10*6/uL — AB (ref 4.20–5.70)
RDW: 26.1 % — AB (ref 11.1–15.7)
WBC: 2.3 10*3/uL — AB (ref 4.0–10.0)

## 2015-11-08 MED ORDER — SODIUM CHLORIDE 0.9% FLUSH
10.0000 mL | INTRAVENOUS | Status: DC | PRN
  Filled 2015-11-08: qty 10

## 2015-11-08 MED ORDER — HEPARIN SOD (PORK) LOCK FLUSH 100 UNIT/ML IV SOLN: 250.0000 [IU] | INTRAVENOUS | Status: DC | PRN

## 2015-11-08 MED ORDER — HEPARIN SOD (PORK) LOCK FLUSH 100 UNIT/ML IV SOLN: 500.0000 [IU] | Freq: Every day | INTRAVENOUS | Status: DC | PRN

## 2015-11-08 MED ORDER — FUROSEMIDE 10 MG/ML IJ SOLN: 20.0000 mg | Freq: Once | INTRAMUSCULAR | Status: DC

## 2015-11-08 MED ORDER — HEPARIN SOD (PORK) LOCK FLUSH 100 UNIT/ML IV SOLN
500.0000 [IU] | Freq: Once | INTRAVENOUS | Status: AC
  Administered 2015-11-08: 500 [IU] via INTRAVENOUS
  Filled 2015-11-08: qty 5

## 2015-11-08 MED ORDER — SODIUM CHLORIDE 0.9% FLUSH
3.0000 mL | INTRAVENOUS | Status: DC | PRN
  Filled 2015-11-08: qty 3

## 2015-11-08 MED ORDER — DIPHENHYDRAMINE HCL 25 MG PO CAPS: 25.0000 mg | ORAL_CAPSULE | Freq: Once | ORAL | Status: DC

## 2015-11-08 MED ORDER — SODIUM CHLORIDE 0.9% FLUSH
10.0000 mL | INTRAVENOUS | Status: DC | PRN
  Administered 2015-11-08: 10 mL via INTRAVENOUS
  Filled 2015-11-08: qty 10

## 2015-11-08 MED ORDER — ACETAMINOPHEN 325 MG PO TABS: 650.0000 mg | ORAL_TABLET | Freq: Once | ORAL | Status: DC

## 2015-11-08 MED ORDER — SODIUM CHLORIDE 0.9 % IV SOLN
250.0000 mL | Freq: Once | INTRAVENOUS | Status: DC
  Filled 2015-11-08: qty 250

## 2015-11-08 NOTE — Progress Notes (Signed)
To be transfused tomorrow, pt and family aware.

## 2015-11-08 NOTE — Patient Instructions (Signed)
Anemia, Nonspecific Anemia is a condition in which the concentration of red blood cells or hemoglobin in the blood is below normal. Hemoglobin is a substance in red blood cells that carries oxygen to the tissues of the body. Anemia results in not enough oxygen reaching these tissues.  CAUSES  Common causes of anemia include:   Excessive bleeding. Bleeding may be internal or external. This includes excessive bleeding from periods (in women) or from the intestine.   Poor nutrition.   Chronic kidney, thyroid, and liver disease.  Bone marrow disorders that decrease red blood cell production.  Cancer and treatments for cancer.  HIV, AIDS, and their treatments.  Spleen problems that increase red blood cell destruction.  Blood disorders.  Excess destruction of red blood cells due to infection, medicines, and autoimmune disorders. SIGNS AND SYMPTOMS   Minor weakness.   Dizziness.   Headache.  Palpitations.   Shortness of breath, especially with exercise.   Paleness.  Cold sensitivity.  Indigestion.  Nausea.  Difficulty sleeping.  Difficulty concentrating. Symptoms may occur suddenly or they may develop slowly.  DIAGNOSIS  Additional blood tests are often needed. These help your health care provider determine the best treatment. Your health care provider will check your stool for blood and look for other causes of blood loss.  TREATMENT  Treatment varies depending on the cause of the anemia. Treatment can include:   Supplements of iron, vitamin B12, or folic acid.   Hormone medicines.   A blood transfusion. This may be needed if blood loss is severe.   Hospitalization. This may be needed if there is significant continual blood loss.   Dietary changes.  Spleen removal. HOME CARE INSTRUCTIONS Keep all follow-up appointments. It often takes many weeks to correct anemia, and having your health care provider check on your condition and your response to  treatment is very important. SEEK IMMEDIATE MEDICAL CARE IF:   You develop extreme weakness, shortness of breath, or chest pain.   You become dizzy or have trouble concentrating.  You develop heavy vaginal bleeding.   You develop a rash.   You have bloody or black, tarry stools.   You faint.   You vomit up blood.   You vomit repeatedly.   You have abdominal pain.  You have a fever or persistent symptoms for more than 2-3 days.   You have a fever and your symptoms suddenly get worse.   You are dehydrated.  MAKE SURE YOU:  Understand these instructions.  Will watch your condition.  Will get help right away if you are not doing well or get worse.   This information is not intended to replace advice given to you by your health care provider. Make sure you discuss any questions you have with your health care provider.   Document Released: 07/05/2004 Document Revised: 01/28/2013 Document Reviewed: 11/21/2012 Elsevier Interactive Patient Education 2016 Elsevier Inc.  

## 2015-11-09 ENCOUNTER — Ambulatory Visit (HOSPITAL_BASED_OUTPATIENT_CLINIC_OR_DEPARTMENT_OTHER): Payer: Medicare Other

## 2015-11-09 ENCOUNTER — Other Ambulatory Visit: Payer: Self-pay | Admitting: Family

## 2015-11-09 VITALS — BP 143/83 | HR 85 | Temp 97.5°F | Resp 20

## 2015-11-09 DIAGNOSIS — D638 Anemia in other chronic diseases classified elsewhere: Secondary | ICD-10-CM

## 2015-11-09 DIAGNOSIS — Z5111 Encounter for antineoplastic chemotherapy: Secondary | ICD-10-CM

## 2015-11-09 DIAGNOSIS — D4620 Refractory anemia with excess of blasts, unspecified: Secondary | ICD-10-CM

## 2015-11-09 DIAGNOSIS — D4621 Refractory anemia with excess of blasts 1: Secondary | ICD-10-CM

## 2015-11-09 DIAGNOSIS — D509 Iron deficiency anemia, unspecified: Secondary | ICD-10-CM

## 2015-11-09 MED ORDER — DIPHENHYDRAMINE HCL 25 MG PO CAPS
25.0000 mg | ORAL_CAPSULE | Freq: Once | ORAL | Status: AC
  Administered 2015-11-09: 25 mg via ORAL

## 2015-11-09 MED ORDER — SODIUM CHLORIDE 0.9 % IV SOLN
250.0000 mL | Freq: Once | INTRAVENOUS | Status: AC
  Administered 2015-11-09: 250 mL via INTRAVENOUS

## 2015-11-09 MED ORDER — FUROSEMIDE 10 MG/ML IJ SOLN
20.0000 mg | Freq: Once | INTRAMUSCULAR | Status: AC
  Administered 2015-11-09: 20 mg via INTRAVENOUS

## 2015-11-09 MED ORDER — HEPARIN SOD (PORK) LOCK FLUSH 100 UNIT/ML IV SOLN
500.0000 [IU] | Freq: Every day | INTRAVENOUS | Status: AC | PRN
  Administered 2015-11-09: 500 [IU]
  Filled 2015-11-09: qty 5

## 2015-11-09 MED ORDER — ACETAMINOPHEN 325 MG PO TABS
ORAL_TABLET | ORAL | Status: AC
  Filled 2015-11-09: qty 2

## 2015-11-09 MED ORDER — FUROSEMIDE 10 MG/ML IJ SOLN
INTRAMUSCULAR | Status: AC
  Filled 2015-11-09: qty 4

## 2015-11-09 MED ORDER — SODIUM CHLORIDE 0.9% FLUSH
10.0000 mL | INTRAVENOUS | Status: AC | PRN
  Administered 2015-11-09: 10 mL
  Filled 2015-11-09: qty 10

## 2015-11-09 MED ORDER — DIPHENHYDRAMINE HCL 25 MG PO CAPS
ORAL_CAPSULE | ORAL | Status: AC
  Filled 2015-11-09: qty 1

## 2015-11-09 MED ORDER — ACETAMINOPHEN 325 MG PO TABS
650.0000 mg | ORAL_TABLET | Freq: Once | ORAL | Status: AC
  Administered 2015-11-09: 650 mg via ORAL

## 2015-11-09 NOTE — Patient Instructions (Signed)

## 2015-11-10 ENCOUNTER — Encounter: Payer: Self-pay | Admitting: Hematology & Oncology

## 2015-11-10 LAB — TYPE AND SCREEN
ABO/RH(D): O POS
Antibody Screen: NEGATIVE
UNIT DIVISION: 0
Unit division: 0

## 2015-11-11 ENCOUNTER — Other Ambulatory Visit: Payer: Self-pay | Admitting: Hematology & Oncology

## 2015-11-11 DIAGNOSIS — D46Z Other myelodysplastic syndromes: Secondary | ICD-10-CM

## 2015-11-14 ENCOUNTER — Ambulatory Visit: Payer: Medicare Other | Admitting: Hematology & Oncology

## 2015-11-14 ENCOUNTER — Ambulatory Visit: Payer: Medicare Other

## 2015-11-14 ENCOUNTER — Other Ambulatory Visit: Payer: Medicare Other

## 2015-11-15 ENCOUNTER — Ambulatory Visit (HOSPITAL_COMMUNITY)
Admission: RE | Admit: 2015-11-15 | Discharge: 2015-11-15 | Disposition: A | Discharge status: Home / Self Care | Payer: Medicare Other | Source: Ambulatory Visit | Attending: Hematology & Oncology | Admitting: Hematology & Oncology

## 2015-11-15 ENCOUNTER — Ambulatory Visit: Payer: Medicare Other

## 2015-11-15 VITALS — BP 139/70 | HR 84 | Temp 97.5°F | Resp 20 | Ht 72.0 in | Wt 201.6 lb

## 2015-11-15 DIAGNOSIS — D4621 Refractory anemia with excess of blasts 1: Secondary | ICD-10-CM | POA: Diagnosis not present

## 2015-11-15 DIAGNOSIS — D469 Myelodysplastic syndrome, unspecified: Secondary | ICD-10-CM | POA: Diagnosis present

## 2015-11-15 DIAGNOSIS — D4622 Refractory anemia with excess of blasts 2: Secondary | ICD-10-CM | POA: Diagnosis not present

## 2015-11-15 DIAGNOSIS — D46Z Other myelodysplastic syndromes: Secondary | ICD-10-CM

## 2015-11-15 LAB — CBC WITH DIFFERENTIAL/PLATELET
BASOS PCT: 0 %
Basophils Absolute: 0 10*3/uL (ref 0.0–0.1)
EOS PCT: 1 %
Eosinophils Absolute: 0 10*3/uL (ref 0.0–0.7)
HEMATOCRIT: 30.6 % — AB (ref 39.0–52.0)
HEMOGLOBIN: 10.3 g/dL — AB (ref 13.0–17.0)
LYMPHS ABS: 1 10*3/uL (ref 0.7–4.0)
Lymphocytes Relative: 40 %
MCH: 32.8 pg (ref 26.0–34.0)
MCHC: 33.7 g/dL (ref 30.0–36.0)
MCV: 97.5 fL (ref 78.0–100.0)
MONO ABS: 0.4 10*3/uL (ref 0.1–1.0)
Monocytes Relative: 16 %
Neutro Abs: 1 10*3/uL — ABNORMAL LOW (ref 1.7–7.7)
Neutrophils Relative %: 43 %
Platelets: 192 10*3/uL (ref 150–400)
RBC: 3.14 MIL/uL — AB (ref 4.22–5.81)
RDW: 23.2 % — ABNORMAL HIGH (ref 11.5–15.5)
WBC: 2.4 10*3/uL — AB (ref 4.0–10.5)

## 2015-11-15 LAB — BONE MARROW EXAM

## 2015-11-15 MED ORDER — SODIUM CHLORIDE 0.9% FLUSH
10.0000 mL | INTRAVENOUS | Status: DC | PRN
  Administered 2015-11-15: 10 mL via INTRAVENOUS
  Filled 2015-11-15: qty 10

## 2015-11-15 MED ORDER — MEPERIDINE HCL 25 MG/ML IJ SOLN
INTRAMUSCULAR | Status: AC | PRN
  Administered 2015-11-15: 25 mg via INTRAVENOUS

## 2015-11-15 MED ORDER — MIDAZOLAM HCL 5 MG/ML IJ SOLN
10.0000 mg | Freq: Once | INTRAMUSCULAR | Status: DC
  Filled 2015-11-15: qty 2

## 2015-11-15 MED ORDER — MIDAZOLAM HCL 5 MG/5ML IJ SOLN
INTRAMUSCULAR | Status: AC | PRN
  Administered 2015-11-15: 1 mg via INTRAVENOUS
  Administered 2015-11-15: 2 mg via INTRAVENOUS
  Administered 2015-11-15: 0.5 mg via INTRAVENOUS

## 2015-11-15 MED ORDER — HEPARIN SOD (PORK) LOCK FLUSH 100 UNIT/ML IV SOLN: 500.0000 [IU] | INTRAVENOUS | Status: DC | PRN

## 2015-11-15 MED ORDER — HEPARIN SOD (PORK) LOCK FLUSH 100 UNIT/ML IV SOLN
500.0000 [IU] | Freq: Once | INTRAVENOUS | Status: AC
  Administered 2015-11-15: 500 [IU]
  Filled 2015-11-15: qty 5

## 2015-11-15 MED ORDER — MEPERIDINE HCL 50 MG/ML IJ SOLN
50.0000 mg | Freq: Once | INTRAMUSCULAR | Status: DC
Start: 2015-11-15 — End: 2015-11-16
  Filled 2015-11-15: qty 1

## 2015-11-15 MED ORDER — SODIUM CHLORIDE 0.9 % IV SOLN
Freq: Once | INTRAVENOUS | Status: AC
  Administered 2015-11-15: 07:00:00 via INTRAVENOUS

## 2015-11-15 NOTE — Discharge Instructions (Signed)
Bone Marrow Aspiration and Bone Marrow Biopsy, Care After Refer to this sheet in the next few weeks. These instructions provide you with information about caring for yourself after your procedure. Your health care provider may also give you more specific instructions. Your treatment has been planned according to current medical practices, but problems sometimes occur. Call your health care provider if you have any problems or questions after your procedure. WHAT TO EXPECT AFTER THE PROCEDURE After your procedure, it is common to have:  Soreness or tenderness around the puncture site.  Bruising. HOME CARE INSTRUCTIONS  Take medicines only as directed by your health care provider.  Follow your health care provider's instructions about:  Puncture site care.       May shower tomorrow and remove dressing  Check your puncture site every day for signs of infection. Watch for:  Redness, swelling, or pain.  Fluid, blood, or pus.  Return to your normal activities as directed by your health care provider.  Keep all follow-up visits as directed by your health care provider. This is important. SEEK MEDICAL CARE IF:  You have a fever.  You have uncontrollable bleeding.  You have redness, swelling, or pain at the site of your puncture.  You have fluid, blood, or pus coming from your puncture site.   This information is not intended to replace advice given to you by your health care provider. Make sure you discuss any questions you have with your health care provider.   Document Released: 12/15/2004 Document Revised: 10/12/2014 Document Reviewed: 05/19/2014 Elsevier Interactive Patient Education 2016 Elsevier Inc.   Moderate Conscious Sedation, Adult, Care After Refer to this sheet in the next few weeks. These instructions provide you with information on caring for yourself after your procedure. Your health care provider may also give you more specific instructions. Your treatment has  been planned according to current medical practices, but problems sometimes occur. Call your health care provider if you have any problems or questions after your procedure. WHAT TO EXPECT AFTER THE PROCEDURE  After your procedure:  You may feel sleepy, clumsy, and have poor balance for several hours.  Vomiting may occur if you eat too soon after the procedure. HOME CARE INSTRUCTIONS  Do not participate in any activities where you could become injured for at least 24 hours. Do not:  Drive.  Swim.  Ride a bicycle.  Operate heavy machinery.  Cook.  Use power tools.  Climb ladders.  Work from a high place.  Do not make important decisions or sign legal documents until you are improved.  If you vomit, drink water, juice, or soup when you can drink without vomiting. Make sure you have little or no nausea before eating solid foods.  Only take over-the-counter or prescription medicines for pain, discomfort, or fever as directed by your health care provider.  Make sure you and your family fully understand everything about the medicines given to you, including what side effects may occur.  You should not drink alcohol, take sleeping pills, or take medicines that cause drowsiness for at least 24 hours.  If you smoke, do not smoke without supervision.  If you are feeling better, you may resume normal activities 24 hours after you were sedated.  Keep all appointments with your health care provider. SEEK MEDICAL CARE IF:  Your skin is pale or bluish in color.  You continue to feel nauseous or vomit.  Your pain is getting worse and is not helped by medicine.  You have  bleeding or swelling.  You are still sleepy or feeling clumsy after 24 hours. SEEK IMMEDIATE MEDICAL CARE IF:  You develop a rash.  You have difficulty breathing.  You develop any type of allergic problem.  You have a fever. MAKE SURE YOU:  Understand these instructions.  Will watch your  condition.  Will get help right away if you are not doing well or get worse.   This information is not intended to replace advice given to you by your health care provider. Make sure you discuss any questions you have with your health care provider.   Document Released: 03/18/2013 Document Revised: 06/18/2014 Document Reviewed: 03/18/2013 Elsevier Interactive Patient Education [employer].

## 2015-11-15 NOTE — Procedures (Signed)
[name] was brought to the short stay unit at [facility] for a bone marrow biopsy and aspirate. This was done to assess the progression of his myelodysplasia.  He had a Port-A-Cath that was accessed by one of the short stay nurses. This is done very professionally as always.  His Mallimpati score is 2. His ASA class is 2.  We did the appropriate timeout procedure at 7:50 AM.  We then. turned on him to his right side. We gave a total of 3.5 mg of Versed and 25 mg of Demerol for IV sedation.  The left posteriorly crest was prepped and draped in sterile fashion. 5 mL 1% lidocaine was obtained under the skin died of the periosteum.  I then used a scalpel to make an incision into the skin.  I used a bone marrow aspirate needle. I obtained 2 aspirates without difficulty. When aspirate will be sent off for cytogenetics and flow cytometry.  I then made another incision to the skin. I used the biopsy needle to obtain a decent bone marrow biopsy core.  I then cleaned and dressed the procedure site sterilely. He tolerated the procedure well. There were no complications.  I then got his wife. I talked to her. I brought her back to the procedure room. I told her that I would call them in 2 or 3 days with the results.  This is a bone marrow biopsy and aspirate procedure note for [name].   Anthony E.

## 2015-11-15 NOTE — Sedation Documentation (Signed)
Bx site c, d, I

## 2015-11-15 NOTE — Sedation Documentation (Signed)
Vital signs stable. Patient tolerating ginger ale and graham crackers

## 2015-11-15 NOTE — Sedation Documentation (Signed)
Patient is resting comfortably. 

## 2015-11-15 NOTE — Sedation Documentation (Signed)
Family updated as to patient's status.

## 2015-11-15 NOTE — Sedation Documentation (Signed)
Procedure completed. Patient returned supine. Tolerated procedure well

## 2015-11-16 ENCOUNTER — Ambulatory Visit: Payer: Medicare Other

## 2015-11-17 ENCOUNTER — Ambulatory Visit: Payer: Medicare Other

## 2015-11-18 ENCOUNTER — Other Ambulatory Visit: Payer: Self-pay | Admitting: *Deleted

## 2015-11-18 ENCOUNTER — Ambulatory Visit: Payer: Medicare Other

## 2015-11-18 DIAGNOSIS — D4620 Refractory anemia with excess of blasts, unspecified: Secondary | ICD-10-CM

## 2015-11-18 DIAGNOSIS — D509 Iron deficiency anemia, unspecified: Secondary | ICD-10-CM

## 2015-11-21 ENCOUNTER — Other Ambulatory Visit (HOSPITAL_BASED_OUTPATIENT_CLINIC_OR_DEPARTMENT_OTHER): Payer: Medicare Other

## 2015-11-21 ENCOUNTER — Ambulatory Visit (HOSPITAL_BASED_OUTPATIENT_CLINIC_OR_DEPARTMENT_OTHER): Payer: Medicare Other

## 2015-11-21 ENCOUNTER — Ambulatory Visit (HOSPITAL_BASED_OUTPATIENT_CLINIC_OR_DEPARTMENT_OTHER): Payer: Medicare Other | Admitting: Hematology & Oncology

## 2015-11-21 ENCOUNTER — Encounter: Payer: Self-pay | Admitting: Hematology & Oncology

## 2015-11-21 VITALS — BP 125/71 | HR 92 | Temp 97.7°F | Resp 18 | Ht 72.0 in | Wt 205.0 lb

## 2015-11-21 DIAGNOSIS — Z452 Encounter for adjustment and management of vascular access device: Secondary | ICD-10-CM

## 2015-11-21 DIAGNOSIS — D509 Iron deficiency anemia, unspecified: Secondary | ICD-10-CM

## 2015-11-21 DIAGNOSIS — D4622 Refractory anemia with excess of blasts 2: Secondary | ICD-10-CM

## 2015-11-21 DIAGNOSIS — D4620 Refractory anemia with excess of blasts, unspecified: Secondary | ICD-10-CM

## 2015-11-21 LAB — CMP (CANCER CENTER ONLY)
ALK PHOS: 50 U/L (ref 26–84)
ALT: 23 U/L (ref 10–47)
AST: 24 U/L (ref 11–38)
Albumin: 3.1 g/dL — ABNORMAL LOW (ref 3.3–5.5)
BILIRUBIN TOTAL: 0.6 mg/dL (ref 0.20–1.60)
BUN: 23 mg/dL — AB (ref 7–22)
CALCIUM: 9.2 mg/dL (ref 8.0–10.3)
CO2: 28 mEq/L (ref 18–33)
CREATININE: 1.3 mg/dL — AB (ref 0.6–1.2)
Chloride: 103 mEq/L (ref 98–108)
GLUCOSE: 142 mg/dL — AB (ref 73–118)
Potassium: 3.8 mEq/L (ref 3.3–4.7)
Sodium: 135 mEq/L (ref 128–145)
TOTAL PROTEIN: 6.8 g/dL (ref 6.4–8.1)

## 2015-11-21 LAB — CBC WITH DIFFERENTIAL (CANCER CENTER ONLY)
BASO#: 0 10*3/uL (ref 0.0–0.2)
BASO%: 0.9 % (ref 0.0–2.0)
EOS%: 0.6 % (ref 0.0–7.0)
Eosinophils Absolute: 0 10*3/uL (ref 0.0–0.5)
HCT: 30.9 % — ABNORMAL LOW (ref 38.7–49.9)
HGB: 10.3 g/dL — ABNORMAL LOW (ref 13.0–17.1)
LYMPH#: 0.4 10*3/uL — ABNORMAL LOW (ref 0.9–3.3)
LYMPH%: 12.1 % — ABNORMAL LOW (ref 14.0–48.0)
MCH: 34.3 pg — ABNORMAL HIGH (ref 28.0–33.4)
MCHC: 33.3 g/dL (ref 32.0–35.9)
MCV: 103 fL — ABNORMAL HIGH (ref 82–98)
MONO#: 0.4 10*3/uL (ref 0.1–0.9)
MONO%: 11.8 % (ref 0.0–13.0)
NEUT#: 2.4 10*3/uL (ref 1.5–6.5)
NEUT%: 74.6 % (ref 40.0–80.0)
Platelets: 193 10*3/uL (ref 145–400)
RBC: 3 10*6/uL — ABNORMAL LOW (ref 4.20–5.70)
RDW: 23.6 % — ABNORMAL HIGH (ref 11.1–15.7)
WBC: 3.2 10*3/uL — ABNORMAL LOW (ref 4.0–10.0)

## 2015-11-21 LAB — IRON AND TIBC
%SAT: 61 % — ABNORMAL HIGH (ref 20–55)
Iron: 147 ug/dL (ref 42–163)
TIBC: 240 ug/dL (ref 202–409)
UIBC: 93 ug/dL — ABNORMAL LOW (ref 117–376)

## 2015-11-21 LAB — FERRITIN

## 2015-11-21 LAB — TECHNOLOGIST REVIEW CHCC SATELLITE

## 2015-11-21 MED ORDER — HEPARIN SOD (PORK) LOCK FLUSH 100 UNIT/ML IV SOLN
500.0000 [IU] | Freq: Once | INTRAVENOUS | Status: DC | PRN
  Filled 2015-11-21: qty 5

## 2015-11-21 MED ORDER — SODIUM CHLORIDE 0.9% FLUSH
10.0000 mL | INTRAVENOUS | Status: DC | PRN
  Administered 2015-11-21: 10 mL via INTRAVENOUS
  Filled 2015-11-21: qty 10

## 2015-11-21 MED ORDER — HEPARIN SOD (PORK) LOCK FLUSH 100 UNIT/ML IV SOLN
500.0000 [IU] | Freq: Once | INTRAVENOUS | Status: AC
  Administered 2015-11-21: 500 [IU] via INTRAVENOUS
  Filled 2015-11-21: qty 5

## 2015-11-21 MED ORDER — SODIUM CHLORIDE 0.9% FLUSH
10.0000 mL | INTRAVENOUS | Status: DC | PRN
  Filled 2015-11-21: qty 10

## 2015-11-21 NOTE — Progress Notes (Signed)
Hematology and Oncology Follow Up Visit  Anthony [name] [id number] 1941-01-31 75 y.o. 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  [name] is back for follow-up. We did go ahead and do a bone marrow biopsy on him. This was done on June 6. The pathology report ([id number]) showed that he had somewhat progression. 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  I guess that I should not be all that surprised by the result.  He looks pretty good today. The last transfused him 2 weeks ago.   We may have to watch out for iron overload. His iron studies today showed a ferritin of 2800 with iron saturation of 61%.   He does not feel dizzy. His appetite has been good. He has had no fever. He has had no rashes. There has been no bleeding.   There's been no change in bowel or bladder habits.   Overall, his performance status is ECOG 1   Medications:  Current outpatient prescriptions:  .  Albuterol Sulfate (PROAIR RESPICLICK) 885 (90 BASE) MCG/ACT AEPB, Inhale 2 puffs into the lungs every 6 (six) hours as needed., Disp: , Rfl:  .  bimatoprost (LUMIGAN) 0.01 % SOLN, Place 1 drop into both eyes at bedtime., Disp: , Rfl:  .  budesonide-formoterol (SYMBICORT) 160-4.5 MCG/ACT inhaler, Inhale 2 puffs into the lungs 2 (two) times daily., Disp: 3 Inhaler, Rfl: 3 .  buPROPion (WELLBUTRIN XL) 150 MG 24 hr tablet, Take 150 mg by mouth daily., Disp: , Rfl:  .  Cholecalciferol (VITAMIN D3) 5000 UNITS TABS, Take by mouth daily., Disp: , Rfl:  .  clopidogrel (PLAVIX) 75 MG tablet, Take 75 mg by mouth daily. , Disp: , Rfl:  .  co-enzyme Q-10 30 MG capsule, Take 100 mg by mouth daily., Disp: , Rfl:  .   Cyanocobalamin (VITAMIN B-12) 2500 MCG TABS, Take 5,000 mcg by mouth daily., Disp: 90 tablet, Rfl: 9 .  fish oil-omega-3 fatty acids 1000 MG capsule, Take 1 g by mouth daily., Disp: , Rfl:  .  Flaxseed, Linseed, (FLAX SEED OIL) 1000 MG CAPS, Take 1,000 mg by mouth daily., Disp: , Rfl:  .  LORazepam (ATIVAN) 0.5 MG tablet, Take 1 tablet (0.5 mg total) by mouth every 6 (six) hours as needed (Nausea or vomiting)., Disp: 60 tablet, Rfl: 0 .  pantoprazole (PROTONIX) 40 MG tablet, Take 40 mg by mouth daily., Disp: , Rfl:  .  predniSONE (DELTASONE) 20 MG tablet, Take 1 tablet (20 mg total) by mouth daily with breakfast., Disp: 30 tablet, Rfl: 6 .  ranitidine (ZANTAC) 300 MG tablet, Take 300 mg by mouth at bedtime. , Disp: , Rfl:  .  sertraline (ZOLOFT) 100 MG tablet, Take 100 mg by mouth daily., Disp: , Rfl: 1 .  SIMBRINZA 1-0.2 % SUSP, , Disp: , Rfl:  .  simvastatin (ZOCOR) 40 MG tablet, Take 40 mg by mouth every other day. , Disp: , Rfl:  .  tiotropium (SPIRIVA) 18 MCG inhalation capsule, Place 18 mcg into inhaler and inhale daily., Disp: , Rfl:  No current facility-administered medications for this visit.  Facility-Administered Medications Ordered in Other Visits:  .  sodium chloride flush (NS) 0.9 % injection 10 mL, 10 mL, Intravenous, PRN, Anthony Napoleon, MD, 10 mL at 11/21/15 1225  Allergies: No Known Allergies  Past Medical History, Surgical history, Social history, and Family History were reviewed and updated.  Review of Systems: As above  Physical Exam:  height is 6' (1.829 m) and weight is 205 lb (92.987 kg). His oral temperature is 97.7 F (36.5 C). His blood pressure is 125/71 and his pulse is 92. His respiration is 18.   Well-developed and well-nourished white general. Head and neck exam shows no ocular or oral lesions. He has no scleral icterus. He has no adenopathy in the neck. Lungs are clear. Cardiac exam regular rate and rhythm with no murmurs, rubs or bruits. Abdomen is soft.  He has good bowel sounds. He's mildly obese. He has no fluid wave. There is no palpable liver or spleen tip. Back exam shows no tenderness over the spine, ribs or hips. Extremities shows no clubbing, cyanosis or edema. Skin exam no rashes, ecchymoses or petechia.  Lab Results  Component Value Date   WBC 3.2* 11/21/2015   HGB 10.3* 11/21/2015   HCT 30.9* 11/21/2015   MCV 103* 11/21/2015   PLT 193 11/21/2015     Chemistry      Component Value Date/Time   NA 135 11/21/2015 1040   NA 140 11/08/2015 0948   NA 141 06/17/2015 1155   K 3.8 11/21/2015 1040   K 3.9 11/08/2015 0948   K 4.4 06/17/2015 1155   CL 103 11/21/2015 1040   CL 107 06/17/2015 1155   CO2 28 11/21/2015 1040   CO2 24 11/08/2015 0948   CO2 25 06/17/2015 1155   BUN 23* 11/21/2015 1040   BUN 21.9 11/08/2015 0948   BUN 27* 06/17/2015 1155   CREATININE 1.3* 11/21/2015 1040   CREATININE 1.4* 11/08/2015 0948   CREATININE 1.48* 06/17/2015 1155      Component Value Date/Time   CALCIUM 9.2 11/21/2015 1040   CALCIUM 9.3 11/08/2015 0948   CALCIUM 9.0 06/17/2015 1155   ALKPHOS 50 11/21/2015 1040   ALKPHOS 45 11/08/2015 0948   ALKPHOS 36* 10/26/2014 1111   AST 24 11/21/2015 1040   AST 15 11/08/2015 0948   AST 19 10/26/2014 1111   ALT 23 11/21/2015 1040   ALT 13 11/08/2015 0948   ALT 15 10/26/2014 1111   BILITOT 0.60 11/21/2015 1040   BILITOT 0.48 11/08/2015 0948   BILITOT 0.4 10/26/2014 1111         Impression and Plan: 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   He has done quite well on the Vidaza. However, I think given the results of the latest bone marrow test, that we are going to have to change his therapy over to decitabine. I think this would be reasonable.  We will have to watch out for iron overload. This may be a factor.  I spoke to he is wife for about 40 minutes. I reviewed the bone marrow report with them. I explained why I thought we needed to make a change with therapy.  I  went over the side effects of decitabine. I told him that his blood counts may get worse before they get better. I told him about the possibility of a rash. He may have nausea. I think the biggest side effect will be fatigued. He understands all this.    We'll go ahead and get started next week with therapy. I will do 7 day therapy  on him. I will do this every 28 days.   After his fourth cycle, I will repeat another bone marrow test on him.       Anthony Napoleon, MD 6/12/20171:50 PM

## 2015-11-21 NOTE — Patient Instructions (Signed)
Implanted Port Insertion, Care After °Refer to this sheet in the next few weeks. These instructions provide you with information on caring for yourself after your procedure. Your health care provider may also give you more specific instructions. Your treatment has been planned according to current medical practices, but problems sometimes occur. Call your health care provider if you have any problems or questions after your procedure. °WHAT TO EXPECT AFTER THE PROCEDURE °After your procedure, it is typical to have the following:  °· Discomfort at the port insertion site. Ice packs to the area will help. °· Bruising on the skin over the port. This will subside in 3-4 days. °HOME CARE INSTRUCTIONS °· After your port is placed, you will get a manufacturer's information card. The card has information about your port. Keep this card with you at all times.   °· Know what kind of port you have. There are many types of ports available.   °· Wear a medical alert bracelet in case of an emergency. This can help alert health care workers that you have a port.   °· The port can stay in for as long as your health care provider believes it is necessary.   °· A home health care nurse may give medicines and take care of the port.   °· You or a family member can get special training and directions for giving medicine and taking care of the port at home.   °SEEK MEDICAL CARE IF:  °· Your port does not flush or you are unable to get a blood return.   °· You have a fever or chills. °SEEK IMMEDIATE MEDICAL CARE IF: °· You have new fluid or pus coming from your incision.   °· You notice a bad smell coming from your incision site.   °· You have swelling, pain, or more redness at the incision or port site.   °· You have chest pain or shortness of breath. °  °This information is not intended to replace advice given to you by your health care provider. Make sure you discuss any questions you have with your health care provider. °  °Document  Released: 03/18/2013 Document Revised: 06/02/2013 Document Reviewed: 03/18/2013 °Elsevier Interactive Patient Education ©2016 Elsevier [employer]. ° °

## 2015-11-22 LAB — RETICULOCYTES: RETICULOCYTE COUNT: 1.9 % (ref 0.6–2.6)

## 2015-11-28 ENCOUNTER — Other Ambulatory Visit: Payer: Medicare Other

## 2015-11-28 ENCOUNTER — Other Ambulatory Visit (HOSPITAL_BASED_OUTPATIENT_CLINIC_OR_DEPARTMENT_OTHER): Payer: Medicare Other

## 2015-11-28 ENCOUNTER — Ambulatory Visit (HOSPITAL_BASED_OUTPATIENT_CLINIC_OR_DEPARTMENT_OTHER): Payer: Medicare Other

## 2015-11-28 ENCOUNTER — Other Ambulatory Visit: Payer: Self-pay | Admitting: *Deleted

## 2015-11-28 VITALS — BP 123/55 | HR 92 | Temp 98.2°F | Resp 18

## 2015-11-28 DIAGNOSIS — D4620 Refractory anemia with excess of blasts, unspecified: Secondary | ICD-10-CM

## 2015-11-28 DIAGNOSIS — Z5111 Encounter for antineoplastic chemotherapy: Secondary | ICD-10-CM

## 2015-11-28 DIAGNOSIS — D4622 Refractory anemia with excess of blasts 2: Secondary | ICD-10-CM

## 2015-11-28 LAB — CBC WITH DIFFERENTIAL (CANCER CENTER ONLY)
BASO#: 0 10*3/uL (ref 0.0–0.2)
BASO%: 0.8 % (ref 0.0–2.0)
EOS ABS: 0 10*3/uL (ref 0.0–0.5)
EOS%: 0.3 % (ref 0.0–7.0)
HEMATOCRIT: 29.7 % — AB (ref 38.7–49.9)
HGB: 9.9 g/dL — ABNORMAL LOW (ref 13.0–17.1)
LYMPH#: 0.6 10*3/uL — AB (ref 0.9–3.3)
LYMPH%: 16.2 % (ref 14.0–48.0)
MCH: 34.6 pg — AB (ref 28.0–33.4)
MCHC: 33.3 g/dL (ref 32.0–35.9)
MCV: 104 fL — AB (ref 82–98)
MONO#: 0.7 10*3/uL (ref 0.1–0.9)
MONO%: 18 % — ABNORMAL HIGH (ref 0.0–13.0)
NEUT#: 2.6 10*3/uL (ref 1.5–6.5)
NEUT%: 64.7 % (ref 40.0–80.0)
PLATELETS: 188 10*3/uL (ref 145–400)
RBC: 2.86 10*6/uL — AB (ref 4.20–5.70)
RDW: 24.3 % — ABNORMAL HIGH (ref 11.1–15.7)
WBC: 4 10*3/uL (ref 4.0–10.0)

## 2015-11-28 LAB — CMP (CANCER CENTER ONLY)
ALK PHOS: 58 U/L (ref 26–84)
ALT(SGPT): 26 U/L (ref 10–47)
AST: 25 U/L (ref 11–38)
Albumin: 3 g/dL — ABNORMAL LOW (ref 3.3–5.5)
BUN: 21 mg/dL (ref 7–22)
CALCIUM: 9.2 mg/dL (ref 8.0–10.3)
CHLORIDE: 100 meq/L (ref 98–108)
CO2: 26 mEq/L (ref 18–33)
Creat: 1.4 mg/dl — ABNORMAL HIGH (ref 0.6–1.2)
Glucose, Bld: 105 mg/dL (ref 73–118)
POTASSIUM: 3.8 meq/L (ref 3.3–4.7)
Sodium: 137 mEq/L (ref 128–145)
TOTAL PROTEIN: 7.1 g/dL (ref 6.4–8.1)
Total Bilirubin: 0.5 mg/dl (ref 0.20–1.60)

## 2015-11-28 LAB — CHROMOSOME ANALYSIS, BONE MARROW

## 2015-11-28 LAB — TECHNOLOGIST REVIEW CHCC SATELLITE

## 2015-11-28 LAB — TISSUE HYBRIDIZATION (BONE MARROW)-NCBH

## 2015-11-28 MED ORDER — PROCHLORPERAZINE MALEATE 10 MG PO TABS: 10.0000 mg | ORAL_TABLET | Freq: Four times a day (QID) | ORAL | Status: DC | PRN

## 2015-11-28 MED ORDER — LORAZEPAM 0.5 MG PO TABS: 0.5000 mg | ORAL_TABLET | Freq: Four times a day (QID) | ORAL | Status: DC | PRN

## 2015-11-28 MED ORDER — HEPARIN SOD (PORK) LOCK FLUSH 100 UNIT/ML IV SOLN
500.0000 [IU] | Freq: Once | INTRAVENOUS | Status: AC | PRN
  Administered 2015-11-28: 500 [IU]
  Filled 2015-11-28: qty 5

## 2015-11-28 MED ORDER — PROCHLORPERAZINE MALEATE 10 MG PO TABS
ORAL_TABLET | ORAL | Status: AC
Start: 2015-11-28 — End: 2015-11-28
  Filled 2015-11-28: qty 1

## 2015-11-28 MED ORDER — SODIUM CHLORIDE 0.9% FLUSH
10.0000 mL | INTRAVENOUS | Status: DC | PRN
  Administered 2015-11-28: 10 mL
  Filled 2015-11-28: qty 10

## 2015-11-28 MED ORDER — SODIUM CHLORIDE 0.9 % IV SOLN
Freq: Once | INTRAVENOUS | Status: AC
  Administered 2015-11-28: 10:00:00 via INTRAVENOUS

## 2015-11-28 MED ORDER — SODIUM CHLORIDE 0.9 % IV SOLN
20.0000 mg/m2 | Freq: Once | INTRAVENOUS | Status: AC
  Administered 2015-11-28: 45 mg via INTRAVENOUS
  Filled 2015-11-28: qty 9

## 2015-11-28 MED ORDER — PROCHLORPERAZINE MALEATE 10 MG PO TABS
10.0000 mg | ORAL_TABLET | Freq: Once | ORAL | Status: AC
  Administered 2015-11-28: 10 mg via ORAL

## 2015-11-28 MED FILL — LORazepam 0.5 MG TABS: 0.5 | 8 days supply | Qty: 30 | Fill #0

## 2015-11-28 MED FILL — predniSONE 20 MG TABS: 20 | 30 days supply | Qty: 30 | Fill #1

## 2015-11-28 MED FILL — PROCHLORPERAZINE 10 MG TAB: 10 | 8 days supply | Qty: 30 | Fill #0

## 2015-11-28 NOTE — Patient Instructions (Signed)
Decitabine injection for infusion What is this medicine? DECITABINE (dee SYE ta been) is a chemotherapy drug. This medicine reduces the growth of cancer cells. It is used to treat adults with myelodysplastic syndromes. This medicine may be used for other purposes; ask your health care provider or pharmacist if you have questions. What should I tell my health care provider before I take this medicine? They need to know if you have any of these conditions: -infection (especially a virus infection such as chickenpox, cold sores, or herpes) -kidney disease -liver disease -an unusual or allergic reaction to decitabine, other medicines, foods, dyes, or preservatives -pregnant or trying to get pregnant -breast-feeding How should I use this medicine? This medicine is for infusion into a vein. It is administered in a hospital or clinic by a doctor or health care professional. Talk to your pediatrician regarding the use of this medicine in children. Special care may be needed. Overdosage: If you think you have taken too much of this medicine contact a poison control center or emergency room at once. NOTE: This medicine is only for you. Do not share this medicine with others. What if I miss a dose? It is important not to miss your dose. Call your doctor or health care professional if you are unable to keep an appointment. What may interact with this medicine? -vaccines Talk to your doctor or health care professional before taking any of these medicines: -aspirin -acetaminophen -ibuprofen -ketoprofen -naproxen This list may not describe all possible interactions. Give your health care provider a list of all the medicines, herbs, non-prescription drugs, or dietary supplements you use. Also tell them if you smoke, drink alcohol, or use illegal drugs. Some items may interact with your medicine. What should I watch for while using this medicine? Visit your doctor for checks on your progress. This drug  may make you feel generally unwell. This is not uncommon, as chemotherapy can affect healthy cells as well as cancer cells. Report any side effects. Continue your course of treatment even though you feel ill unless your doctor tells you to stop. In some cases, you may be given additional medicines to help with side effects. Follow all directions for their use. Call your doctor or health care professional for advice if you get a fever, chills or sore throat, or other symptoms of a cold or flu. Do not treat yourself. This drug decreases your body's ability to fight infections. Try to avoid being around people who are sick. This medicine may increase your risk to bruise or bleed. Call your doctor or health care professional if you notice any unusual bleeding. Do not become pregnant while taking this medicine or for at least 1 month after stopping it. Women should inform their doctor if they wish to become pregnant or think they might be pregnant. Men should not father a child while taking this medicine and for at least 2 months after stopping it. There is a potential for serious side effects to an unborn child. Talk to your health care professional or pharmacist for more information. Do not breast-feed an infant while taking this medicine. What side effects may I notice from receiving this medicine? Side effects that you should report to your doctor or health care professional as soon as possible: -low blood counts - this medicine may decrease the number of white blood cells, red blood cells and platelets. You may be at increased risk for infections and bleeding. -signs of infection - fever or chills, cough, sore throat,   pain or difficulty passing urine -signs of decreased platelets or bleeding - bruising, pinpoint red spots on the skin, black, tarry stools, blood in the urine -signs of decreased red blood cells - unusual weakness or tiredness, fainting spells, lightheadedness -increased blood sugar Side  effects that usually do not require medical attention (report to your prescriber or health care professional if they continue or are bothersome): -constipation -diarrhea -headache -loss of appetite -nausea, vomiting -skin rash, itching -stomach pain -water retention -weak or tired This list may not describe all possible side effects. Call your doctor for medical advice about side effects. You may report side effects to FDA at 1-800-FDA-1088. Where should I keep my medicine? This drug is given in a hospital or clinic and will not be stored at home. NOTE: This sheet is a summary. It may not cover all possible information. If you have questions about this medicine, talk to your doctor, pharmacist, or health care provider.    2016, Elsevier/Gold Standard. (2014-12-28 12:56:02)

## 2015-11-29 ENCOUNTER — Ambulatory Visit (HOSPITAL_BASED_OUTPATIENT_CLINIC_OR_DEPARTMENT_OTHER): Payer: Medicare Other

## 2015-11-29 VITALS — BP 105/65 | HR 100 | Temp 97.1°F | Resp 16

## 2015-11-29 DIAGNOSIS — D4622 Refractory anemia with excess of blasts 2: Secondary | ICD-10-CM

## 2015-11-29 DIAGNOSIS — Z5111 Encounter for antineoplastic chemotherapy: Secondary | ICD-10-CM

## 2015-11-29 DIAGNOSIS — D4620 Refractory anemia with excess of blasts, unspecified: Secondary | ICD-10-CM

## 2015-11-29 MED ORDER — SODIUM CHLORIDE 0.9% FLUSH
10.0000 mL | INTRAVENOUS | Status: DC | PRN
  Administered 2015-11-29: 10 mL
  Filled 2015-11-29: qty 10

## 2015-11-29 MED ORDER — PROCHLORPERAZINE MALEATE 10 MG PO TABS
ORAL_TABLET | ORAL | Status: AC
  Filled 2015-11-29: qty 1

## 2015-11-29 MED ORDER — SODIUM CHLORIDE 0.9 % IV SOLN
Freq: Once | INTRAVENOUS | Status: AC
  Administered 2015-11-29: 10:00:00 via INTRAVENOUS

## 2015-11-29 MED ORDER — SODIUM CHLORIDE 0.9 % IV SOLN
20.0000 mg/m2 | Freq: Once | INTRAVENOUS | Status: AC
  Administered 2015-11-29: 45 mg via INTRAVENOUS
  Filled 2015-11-29: qty 9

## 2015-11-29 MED ORDER — HEPARIN SOD (PORK) LOCK FLUSH 100 UNIT/ML IV SOLN
500.0000 [IU] | Freq: Once | INTRAVENOUS | Status: AC | PRN
  Administered 2015-11-29: 500 [IU]
  Filled 2015-11-29: qty 5

## 2015-11-29 MED ORDER — PROCHLORPERAZINE MALEATE 10 MG PO TABS
10.0000 mg | ORAL_TABLET | Freq: Once | ORAL | Status: AC
  Administered 2015-11-29: 10 mg via ORAL

## 2015-11-29 NOTE — Patient Instructions (Signed)
[facility] Discharge Instructions for Patients Receiving Chemotherapy  Today you received the following chemotherapy agents Dacogen.  To help prevent nausea and vomiting after your treatment, we encourage you to take your nausea medication.   If you develop nausea and vomiting that is not controlled by your nausea medication, call the clinic.   BELOW ARE SYMPTOMS THAT SHOULD BE REPORTED IMMEDIATELY:  *FEVER GREATER THAN 100.5 F  *CHILLS WITH OR WITHOUT FEVER  NAUSEA AND VOMITING THAT IS NOT CONTROLLED WITH YOUR NAUSEA MEDICATION  *UNUSUAL SHORTNESS OF BREATH  *UNUSUAL BRUISING OR BLEEDING  TENDERNESS IN MOUTH AND THROAT WITH OR WITHOUT PRESENCE OF ULCERS  *URINARY PROBLEMS  *BOWEL PROBLEMS  UNUSUAL RASH Items with * indicate a potential emergency and should be followed up as soon as possible.  Feel free to call the clinic you have any questions or concerns. The clinic phone number is [phone].  Please show the CHEMO ALERT CARD at check-in to the Emergency Department and triage nurse.  

## 2015-11-30 ENCOUNTER — Encounter: Payer: Self-pay | Admitting: *Deleted

## 2015-11-30 ENCOUNTER — Ambulatory Visit (HOSPITAL_BASED_OUTPATIENT_CLINIC_OR_DEPARTMENT_OTHER): Payer: Medicare Other

## 2015-11-30 VITALS — BP 112/54 | HR 85 | Temp 97.6°F | Resp 16

## 2015-11-30 DIAGNOSIS — D4622 Refractory anemia with excess of blasts 2: Secondary | ICD-10-CM | POA: Diagnosis not present

## 2015-11-30 DIAGNOSIS — Z5111 Encounter for antineoplastic chemotherapy: Secondary | ICD-10-CM | POA: Diagnosis not present

## 2015-11-30 DIAGNOSIS — D4620 Refractory anemia with excess of blasts, unspecified: Secondary | ICD-10-CM

## 2015-11-30 MED ORDER — HEPARIN SOD (PORK) LOCK FLUSH 100 UNIT/ML IV SOLN
500.0000 [IU] | Freq: Once | INTRAVENOUS | Status: AC | PRN
  Administered 2015-11-30: 500 [IU]
  Filled 2015-11-30: qty 5

## 2015-11-30 MED ORDER — SODIUM CHLORIDE 0.9 % IV SOLN
20.0000 mg/m2 | Freq: Once | INTRAVENOUS | Status: AC
  Administered 2015-11-30: 45 mg via INTRAVENOUS
  Filled 2015-11-30: qty 9

## 2015-11-30 MED ORDER — SODIUM CHLORIDE 0.9 % IV SOLN
Freq: Once | INTRAVENOUS | Status: AC
  Administered 2015-11-30: 10:00:00 via INTRAVENOUS

## 2015-11-30 MED ORDER — PROCHLORPERAZINE MALEATE 10 MG PO TABS
ORAL_TABLET | ORAL | Status: AC
  Filled 2015-11-30: qty 1

## 2015-11-30 MED ORDER — SODIUM CHLORIDE 0.9% FLUSH
10.0000 mL | INTRAVENOUS | Status: DC | PRN
  Administered 2015-11-30: 10 mL
  Filled 2015-11-30: qty 10

## 2015-11-30 MED ORDER — PROCHLORPERAZINE MALEATE 10 MG PO TABS
10.0000 mg | ORAL_TABLET | Freq: Once | ORAL | Status: AC
  Administered 2015-11-30: 10 mg via ORAL

## 2015-11-30 NOTE — Patient Instructions (Signed)
[facility] Discharge Instructions for Patients Receiving Chemotherapy  Today you received the following chemotherapy agents Dacogen.  To help prevent nausea and vomiting after your treatment, we encourage you to take your nausea medication.   If you develop nausea and vomiting that is not controlled by your nausea medication, call the clinic.   BELOW ARE SYMPTOMS THAT SHOULD BE REPORTED IMMEDIATELY:  *FEVER GREATER THAN 100.5 F  *CHILLS WITH OR WITHOUT FEVER  NAUSEA AND VOMITING THAT IS NOT CONTROLLED WITH YOUR NAUSEA MEDICATION  *UNUSUAL SHORTNESS OF BREATH  *UNUSUAL BRUISING OR BLEEDING  TENDERNESS IN MOUTH AND THROAT WITH OR WITHOUT PRESENCE OF ULCERS  *URINARY PROBLEMS  *BOWEL PROBLEMS  UNUSUAL RASH Items with * indicate a potential emergency and should be followed up as soon as possible.  Feel free to call the clinic you have any questions or concerns. The clinic phone number is [phone].  Please show the CHEMO ALERT CARD at check-in to the Emergency Department and triage nurse.  

## 2015-12-01 ENCOUNTER — Ambulatory Visit (HOSPITAL_BASED_OUTPATIENT_CLINIC_OR_DEPARTMENT_OTHER): Payer: Medicare Other

## 2015-12-01 VITALS — BP 124/54 | HR 98 | Temp 97.0°F | Resp 16

## 2015-12-01 DIAGNOSIS — Z5111 Encounter for antineoplastic chemotherapy: Secondary | ICD-10-CM | POA: Diagnosis not present

## 2015-12-01 DIAGNOSIS — D4622 Refractory anemia with excess of blasts 2: Secondary | ICD-10-CM

## 2015-12-01 DIAGNOSIS — D4620 Refractory anemia with excess of blasts, unspecified: Secondary | ICD-10-CM

## 2015-12-01 MED ORDER — SODIUM CHLORIDE 0.9% FLUSH
10.0000 mL | INTRAVENOUS | Status: DC | PRN
  Administered 2015-12-01: 10 mL
  Filled 2015-12-01: qty 10

## 2015-12-01 MED ORDER — SODIUM CHLORIDE 0.9 % IV SOLN
Freq: Once | INTRAVENOUS | Status: AC
  Administered 2015-12-01: 10:00:00 via INTRAVENOUS

## 2015-12-01 MED ORDER — HEPARIN SOD (PORK) LOCK FLUSH 100 UNIT/ML IV SOLN
500.0000 [IU] | Freq: Once | INTRAVENOUS | Status: AC | PRN
  Administered 2015-12-01: 500 [IU]
  Filled 2015-12-01: qty 5

## 2015-12-01 MED ORDER — PROCHLORPERAZINE MALEATE 10 MG PO TABS
ORAL_TABLET | ORAL | Status: AC
  Filled 2015-12-01: qty 1

## 2015-12-01 MED ORDER — PROCHLORPERAZINE MALEATE 10 MG PO TABS
10.0000 mg | ORAL_TABLET | Freq: Once | ORAL | Status: AC
  Administered 2015-12-01: 10 mg via ORAL

## 2015-12-01 MED ORDER — SODIUM CHLORIDE 0.9 % IV SOLN
20.0000 mg/m2 | Freq: Once | INTRAVENOUS | Status: AC
  Administered 2015-12-01: 45 mg via INTRAVENOUS
  Filled 2015-12-01: qty 9

## 2015-12-01 NOTE — Patient Instructions (Signed)
[facility] Discharge Instructions for Patients Receiving Chemotherapy  Today you received the following chemotherapy agents Dacogen.  To help prevent nausea and vomiting after your treatment, we encourage you to take your nausea medication.   If you develop nausea and vomiting that is not controlled by your nausea medication, call the clinic.   BELOW ARE SYMPTOMS THAT SHOULD BE REPORTED IMMEDIATELY:  *FEVER GREATER THAN 100.5 F  *CHILLS WITH OR WITHOUT FEVER  NAUSEA AND VOMITING THAT IS NOT CONTROLLED WITH YOUR NAUSEA MEDICATION  *UNUSUAL SHORTNESS OF BREATH  *UNUSUAL BRUISING OR BLEEDING  TENDERNESS IN MOUTH AND THROAT WITH OR WITHOUT PRESENCE OF ULCERS  *URINARY PROBLEMS  *BOWEL PROBLEMS  UNUSUAL RASH Items with * indicate a potential emergency and should be followed up as soon as possible.  Feel free to call the clinic you have any questions or concerns. The clinic phone number is [phone].  Please show the CHEMO ALERT CARD at check-in to the Emergency Department and triage nurse.  

## 2015-12-02 ENCOUNTER — Other Ambulatory Visit: Payer: Self-pay | Admitting: *Deleted

## 2015-12-02 ENCOUNTER — Ambulatory Visit (HOSPITAL_BASED_OUTPATIENT_CLINIC_OR_DEPARTMENT_OTHER): Payer: Medicare Other

## 2015-12-02 VITALS — BP 121/58 | HR 118 | Temp 97.0°F | Resp 22

## 2015-12-02 DIAGNOSIS — Z5111 Encounter for antineoplastic chemotherapy: Secondary | ICD-10-CM | POA: Diagnosis not present

## 2015-12-02 DIAGNOSIS — D4622 Refractory anemia with excess of blasts 2: Secondary | ICD-10-CM

## 2015-12-02 DIAGNOSIS — D4620 Refractory anemia with excess of blasts, unspecified: Secondary | ICD-10-CM

## 2015-12-02 MED ORDER — DECITABINE CHEMO INJECTION 50 MG
20.0000 mg/m2 | Freq: Once | INTRAVENOUS | Status: AC
  Administered 2015-12-02: 45 mg via INTRAVENOUS
  Filled 2015-12-02: qty 9

## 2015-12-02 MED ORDER — HEPARIN SOD (PORK) LOCK FLUSH 100 UNIT/ML IV SOLN
500.0000 [IU] | Freq: Once | INTRAVENOUS | Status: AC | PRN
  Administered 2015-12-02: 500 [IU]
  Filled 2015-12-02: qty 5

## 2015-12-02 MED ORDER — PROCHLORPERAZINE MALEATE 10 MG PO TABS
ORAL_TABLET | ORAL | Status: AC
Start: 2015-12-02 — End: 2015-12-02
  Filled 2015-12-02: qty 1

## 2015-12-02 MED ORDER — SODIUM CHLORIDE 0.9 % IV SOLN
Freq: Once | INTRAVENOUS | Status: AC
  Administered 2015-12-02: 10:00:00 via INTRAVENOUS

## 2015-12-02 MED ORDER — PROCHLORPERAZINE MALEATE 10 MG PO TABS
10.0000 mg | ORAL_TABLET | Freq: Once | ORAL | Status: AC
  Administered 2015-12-02: 10 mg via ORAL

## 2015-12-02 MED ORDER — SODIUM CHLORIDE 0.9% FLUSH
10.0000 mL | INTRAVENOUS | Status: DC | PRN
  Administered 2015-12-02: 10 mL
  Filled 2015-12-02: qty 10

## 2015-12-02 NOTE — Patient Instructions (Signed)
Decitabine injection for infusion What is this medicine? DECITABINE (dee SYE ta been) is a chemotherapy drug. This medicine reduces the growth of cancer cells. It is used to treat adults with myelodysplastic syndromes. This medicine may be used for other purposes; ask your health care provider or pharmacist if you have questions. What should I tell my health care provider before I take this medicine? They need to know if you have any of these conditions: -infection (especially a virus infection such as chickenpox, cold sores, or herpes) -kidney disease -liver disease -an unusual or allergic reaction to decitabine, other medicines, foods, dyes, or preservatives -pregnant or trying to get pregnant -breast-feeding How should I use this medicine? This medicine is for infusion into a vein. It is administered in a hospital or clinic by a doctor or health care professional. Talk to your pediatrician regarding the use of this medicine in children. Special care may be needed. Overdosage: If you think you have taken too much of this medicine contact a poison control center or emergency room at once. NOTE: This medicine is only for you. Do not share this medicine with others. What if I miss a dose? It is important not to miss your dose. Call your doctor or health care professional if you are unable to keep an appointment. What may interact with this medicine? -vaccines Talk to your doctor or health care professional before taking any of these medicines: -aspirin -acetaminophen -ibuprofen -ketoprofen -naproxen This list may not describe all possible interactions. Give your health care provider a list of all the medicines, herbs, non-prescription drugs, or dietary supplements you use. Also tell them if you smoke, drink alcohol, or use illegal drugs. Some items may interact with your medicine. What should I watch for while using this medicine? Visit your doctor for checks on your progress. This drug  may make you feel generally unwell. This is not uncommon, as chemotherapy can affect healthy cells as well as cancer cells. Report any side effects. Continue your course of treatment even though you feel ill unless your doctor tells you to stop. In some cases, you may be given additional medicines to help with side effects. Follow all directions for their use. Call your doctor or health care professional for advice if you get a fever, chills or sore throat, or other symptoms of a cold or flu. Do not treat yourself. This drug decreases your body's ability to fight infections. Try to avoid being around people who are sick. This medicine may increase your risk to bruise or bleed. Call your doctor or health care professional if you notice any unusual bleeding. Do not become pregnant while taking this medicine or for at least 1 month after stopping it. Women should inform their doctor if they wish to become pregnant or think they might be pregnant. Men should not father a child while taking this medicine and for at least 2 months after stopping it. There is a potential for serious side effects to an unborn child. Talk to your health care professional or pharmacist for more information. Do not breast-feed an infant while taking this medicine. What side effects may I notice from receiving this medicine? Side effects that you should report to your doctor or health care professional as soon as possible: -low blood counts - this medicine may decrease the number of white blood cells, red blood cells and platelets. You may be at increased risk for infections and bleeding. -signs of infection - fever or chills, cough, sore throat,  pain or difficulty passing urine -signs of decreased platelets or bleeding - bruising, pinpoint red spots on the skin, black, tarry stools, blood in the urine -signs of decreased red blood cells - unusual weakness or tiredness, fainting spells, lightheadedness -increased blood sugar Side  effects that usually do not require medical attention (report to your prescriber or health care professional if they continue or are bothersome): -constipation -diarrhea -headache -loss of appetite -nausea, vomiting -skin rash, itching -stomach pain -water retention -weak or tired This list may not describe all possible side effects. Call your doctor for medical advice about side effects. You may report side effects to FDA at 1-800-FDA-1088. Where should I keep my medicine? This drug is given in a hospital or clinic and will not be stored at home. NOTE: This sheet is a summary. It may not cover all possible information. If you have questions about this medicine, talk to your doctor, pharmacist, or health care provider.    2016, Elsevier/[employer] Standard. (2014-12-28 12:56:02)

## 2015-12-05 ENCOUNTER — Other Ambulatory Visit: Payer: Medicare Other

## 2015-12-05 ENCOUNTER — Ambulatory Visit (HOSPITAL_BASED_OUTPATIENT_CLINIC_OR_DEPARTMENT_OTHER): Payer: Medicare Other

## 2015-12-05 ENCOUNTER — Other Ambulatory Visit (HOSPITAL_BASED_OUTPATIENT_CLINIC_OR_DEPARTMENT_OTHER): Payer: Medicare Other

## 2015-12-05 VITALS — BP 134/52 | HR 89 | Temp 97.9°F | Resp 18

## 2015-12-05 DIAGNOSIS — D4620 Refractory anemia with excess of blasts, unspecified: Secondary | ICD-10-CM

## 2015-12-05 DIAGNOSIS — D4622 Refractory anemia with excess of blasts 2: Secondary | ICD-10-CM

## 2015-12-05 DIAGNOSIS — Z5111 Encounter for antineoplastic chemotherapy: Secondary | ICD-10-CM

## 2015-12-05 LAB — CBC WITH DIFFERENTIAL (CANCER CENTER ONLY)
BASO#: 0 10*3/uL (ref 0.0–0.2)
BASO%: 0.4 % (ref 0.0–2.0)
EOS%: 0.8 % (ref 0.0–7.0)
Eosinophils Absolute: 0 10*3/uL (ref 0.0–0.5)
HCT: 26.7 % — ABNORMAL LOW (ref 38.7–49.9)
HGB: 8.9 g/dL — ABNORMAL LOW (ref 13.0–17.1)
LYMPH#: 1 10*3/uL (ref 0.9–3.3)
LYMPH%: 38.2 % (ref 14.0–48.0)
MCH: 35.2 pg — ABNORMAL HIGH (ref 28.0–33.4)
MCHC: 33.3 g/dL (ref 32.0–35.9)
MCV: 106 fL — AB (ref 82–98)
MONO#: 0.4 10*3/uL (ref 0.1–0.9)
MONO%: 15.3 % — ABNORMAL HIGH (ref 0.0–13.0)
NEUT#: 1.2 10*3/uL — ABNORMAL LOW (ref 1.5–6.5)
NEUT%: 45.3 % (ref 40.0–80.0)
PLATELETS: 132 10*3/uL — AB (ref 145–400)
RBC: 2.53 10*6/uL — AB (ref 4.20–5.70)
RDW: 24.1 % — AB (ref 11.1–15.7)
WBC: 2.6 10*3/uL — AB (ref 4.0–10.0)

## 2015-12-05 LAB — CMP (CANCER CENTER ONLY)
ALK PHOS: 49 U/L (ref 26–84)
ALT: 25 U/L (ref 10–47)
AST: 20 U/L (ref 11–38)
Albumin: 3 g/dL — ABNORMAL LOW (ref 3.3–5.5)
BILIRUBIN TOTAL: 0.7 mg/dL (ref 0.20–1.60)
BUN: 24 mg/dL — AB (ref 7–22)
CO2: 27 mEq/L (ref 18–33)
CREATININE: 1.5 mg/dL — AB (ref 0.6–1.2)
Calcium: 8.7 mg/dL (ref 8.0–10.3)
Chloride: 104 mEq/L (ref 98–108)
GLUCOSE: 91 mg/dL (ref 73–118)
POTASSIUM: 3.6 meq/L (ref 3.3–4.7)
Sodium: 137 mEq/L (ref 128–145)
TOTAL PROTEIN: 6.8 g/dL (ref 6.4–8.1)

## 2015-12-05 LAB — TECHNOLOGIST REVIEW CHCC SATELLITE

## 2015-12-05 MED ORDER — SODIUM CHLORIDE 0.9% FLUSH
10.0000 mL | INTRAVENOUS | Status: DC | PRN
  Filled 2015-12-05: qty 10

## 2015-12-05 MED ORDER — HEPARIN SOD (PORK) LOCK FLUSH 100 UNIT/ML IV SOLN
250.0000 [IU] | Freq: Once | INTRAVENOUS | Status: DC | PRN
  Filled 2015-12-05: qty 5

## 2015-12-05 MED ORDER — SODIUM CHLORIDE 0.9 % IV SOLN
Freq: Once | INTRAVENOUS | Status: AC
Start: 2015-12-05 — End: 2015-12-05
  Administered 2015-12-05: 09:00:00 via INTRAVENOUS

## 2015-12-05 MED ORDER — ALTEPLASE 2 MG IJ SOLR
2.0000 mg | Freq: Once | INTRAMUSCULAR | Status: DC | PRN
  Filled 2015-12-05: qty 2

## 2015-12-05 MED ORDER — SODIUM CHLORIDE 0.9 % IV SOLN
20.0000 mg/m2 | Freq: Once | INTRAVENOUS | Status: AC
  Administered 2015-12-05: 45 mg via INTRAVENOUS
  Filled 2015-12-05: qty 9

## 2015-12-05 MED ORDER — PROCHLORPERAZINE MALEATE 10 MG PO TABS
10.0000 mg | ORAL_TABLET | Freq: Once | ORAL | Status: AC
  Administered 2015-12-05: 10 mg via ORAL

## 2015-12-05 MED ORDER — HEPARIN SOD (PORK) LOCK FLUSH 100 UNIT/ML IV SOLN
500.0000 [IU] | Freq: Once | INTRAVENOUS | Status: DC | PRN
  Filled 2015-12-05: qty 5

## 2015-12-05 MED ORDER — PROCHLORPERAZINE MALEATE 10 MG PO TABS
ORAL_TABLET | ORAL | Status: AC
  Filled 2015-12-05: qty 1

## 2015-12-05 MED ORDER — SODIUM CHLORIDE 0.9% FLUSH
3.0000 mL | INTRAVENOUS | Status: DC | PRN
  Filled 2015-12-05: qty 10

## 2015-12-05 NOTE — Patient Instructions (Signed)
Decitabine injection for infusion What is this medicine? DECITABINE (dee SYE ta been) is a chemotherapy drug. This medicine reduces the growth of cancer cells. It is used to treat adults with myelodysplastic syndromes. This medicine may be used for other purposes; ask your health care provider or pharmacist if you have questions. What should I tell my health care provider before I take this medicine? They need to know if you have any of these conditions: -infection (especially a virus infection such as chickenpox, cold sores, or herpes) -kidney disease -liver disease -an unusual or allergic reaction to decitabine, other medicines, foods, dyes, or preservatives -pregnant or trying to get pregnant -breast-feeding How should I use this medicine? This medicine is for infusion into a vein. It is administered in a hospital or clinic by a doctor or health care professional. Talk to your pediatrician regarding the use of this medicine in children. Special care may be needed. Overdosage: If you think you have taken too much of this medicine contact a poison control center or emergency room at once. NOTE: This medicine is only for you. Do not share this medicine with others. What if I miss a dose? It is important not to miss your dose. Call your doctor or health care professional if you are unable to keep an appointment. What may interact with this medicine? -vaccines Talk to your doctor or health care professional before taking any of these medicines: -aspirin -acetaminophen -ibuprofen -ketoprofen -naproxen This list may not describe all possible interactions. Give your health care provider a list of all the medicines, herbs, non-prescription drugs, or dietary supplements you use. Also tell them if you smoke, drink alcohol, or use illegal drugs. Some items may interact with your medicine. What should I watch for while using this medicine? Visit your doctor for checks on your progress. This drug  may make you feel generally unwell. This is not uncommon, as chemotherapy can affect healthy cells as well as cancer cells. Report any side effects. Continue your course of treatment even though you feel ill unless your doctor tells you to stop. In some cases, you may be given additional medicines to help with side effects. Follow all directions for their use. Call your doctor or health care professional for advice if you get a fever, chills or sore throat, or other symptoms of a cold or flu. Do not treat yourself. This drug decreases your body's ability to fight infections. Try to avoid being around people who are sick. This medicine may increase your risk to bruise or bleed. Call your doctor or health care professional if you notice any unusual bleeding. Do not become pregnant while taking this medicine or for at least 1 month after stopping it. Women should inform their doctor if they wish to become pregnant or think they might be pregnant. Men should not father a child while taking this medicine and for at least 2 months after stopping it. There is a potential for serious side effects to an unborn child. Talk to your health care professional or pharmacist for more information. Do not breast-feed an infant while taking this medicine. What side effects may I notice from receiving this medicine? Side effects that you should report to your doctor or health care professional as soon as possible: -low blood counts - this medicine may decrease the number of white blood cells, red blood cells and platelets. You may be at increased risk for infections and bleeding. -signs of infection - fever or chills, cough, sore throat,  pain or difficulty passing urine -signs of decreased platelets or bleeding - bruising, pinpoint red spots on the skin, black, tarry stools, blood in the urine -signs of decreased red blood cells - unusual weakness or tiredness, fainting spells, lightheadedness -increased blood sugar Side  effects that usually do not require medical attention (report to your prescriber or health care professional if they continue or are bothersome): -constipation -diarrhea -headache -loss of appetite -nausea, vomiting -skin rash, itching -stomach pain -water retention -weak or tired This list may not describe all possible side effects. Call your doctor for medical advice about side effects. You may report side effects to FDA at 1-800-FDA-1088. Where should I keep my medicine? This drug is given in a hospital or clinic and will not be stored at home. NOTE: This sheet is a summary. It may not cover all possible information. If you have questions about this medicine, talk to your doctor, pharmacist, or health care provider.    2016, Elsevier/[employer] Standard. (2014-12-28 12:56:02)

## 2015-12-06 ENCOUNTER — Other Ambulatory Visit: Payer: Medicare Other

## 2015-12-06 ENCOUNTER — Ambulatory Visit: Payer: Medicare Other

## 2015-12-06 ENCOUNTER — Ambulatory Visit (HOSPITAL_BASED_OUTPATIENT_CLINIC_OR_DEPARTMENT_OTHER): Payer: Medicare Other

## 2015-12-06 VITALS — BP 128/64 | HR 103 | Temp 97.6°F | Resp 18

## 2015-12-06 DIAGNOSIS — D4622 Refractory anemia with excess of blasts 2: Secondary | ICD-10-CM | POA: Diagnosis not present

## 2015-12-06 DIAGNOSIS — D4620 Refractory anemia with excess of blasts, unspecified: Secondary | ICD-10-CM

## 2015-12-06 DIAGNOSIS — Z5111 Encounter for antineoplastic chemotherapy: Secondary | ICD-10-CM | POA: Diagnosis not present

## 2015-12-06 MED ORDER — DECITABINE CHEMO INJECTION 50 MG
20.0000 mg/m2 | Freq: Once | INTRAVENOUS | Status: AC
  Administered 2015-12-06: 45 mg via INTRAVENOUS
  Filled 2015-12-06: qty 9

## 2015-12-06 MED ORDER — HEPARIN SOD (PORK) LOCK FLUSH 100 UNIT/ML IV SOLN
500.0000 [IU] | Freq: Once | INTRAVENOUS | Status: AC | PRN
  Administered 2015-12-06: 500 [IU]
  Filled 2015-12-06: qty 5

## 2015-12-06 MED ORDER — PROCHLORPERAZINE MALEATE 10 MG PO TABS
10.0000 mg | ORAL_TABLET | Freq: Once | ORAL | Status: AC
  Administered 2015-12-06: 10 mg via ORAL

## 2015-12-06 MED ORDER — DARBEPOETIN ALFA 200 MCG/0.4ML IJ SOSY
PREFILLED_SYRINGE | INTRAMUSCULAR | Status: AC
  Filled 2015-12-06: qty 0.8

## 2015-12-06 MED ORDER — SODIUM CHLORIDE 0.9% FLUSH
10.0000 mL | INTRAVENOUS | Status: DC | PRN
  Administered 2015-12-06: 10 mL
  Filled 2015-12-06: qty 10

## 2015-12-06 MED ORDER — DARBEPOETIN ALFA 500 MCG/ML IJ SOSY: 400.0000 ug | PREFILLED_SYRINGE | Freq: Once | INTRAMUSCULAR | Status: DC

## 2015-12-06 MED ORDER — SODIUM CHLORIDE 0.9 % IV SOLN
Freq: Once | INTRAVENOUS | Status: AC
  Administered 2015-12-06: 10:00:00 via INTRAVENOUS

## 2015-12-06 MED ORDER — DARBEPOETIN ALFA 500 MCG/ML IJ SOSY
400.0000 ug | PREFILLED_SYRINGE | Freq: Once | INTRAMUSCULAR | Status: AC
  Administered 2015-12-06: 400 ug via SUBCUTANEOUS

## 2015-12-06 MED ORDER — PROCHLORPERAZINE MALEATE 10 MG PO TABS
ORAL_TABLET | ORAL | Status: AC
  Filled 2015-12-06: qty 1

## 2015-12-06 NOTE — Patient Instructions (Signed)
Decitabine injection for infusion What is this medicine? DECITABINE (dee SYE ta been) is a chemotherapy drug. This medicine reduces the growth of cancer cells. It is used to treat adults with myelodysplastic syndromes. This medicine may be used for other purposes; ask your health care provider or pharmacist if you have questions. What should I tell my health care provider before I take this medicine? They need to know if you have any of these conditions: -infection (especially a virus infection such as chickenpox, cold sores, or herpes) -kidney disease -liver disease -an unusual or allergic reaction to decitabine, other medicines, foods, dyes, or preservatives -pregnant or trying to get pregnant -breast-feeding How should I use this medicine? This medicine is for infusion into a vein. It is administered in a hospital or clinic by a doctor or health care professional. Talk to your pediatrician regarding the use of this medicine in children. Special care may be needed. Overdosage: If you think you have taken too much of this medicine contact a poison control center or emergency room at once. NOTE: This medicine is only for you. Do not share this medicine with others. What if I miss a dose? It is important not to miss your dose. Call your doctor or health care professional if you are unable to keep an appointment. What may interact with this medicine? -vaccines Talk to your doctor or health care professional before taking any of these medicines: -aspirin -acetaminophen -ibuprofen -ketoprofen -naproxen This list may not describe all possible interactions. Give your health care provider a list of all the medicines, herbs, non-prescription drugs, or dietary supplements you use. Also tell them if you smoke, drink alcohol, or use illegal drugs. Some items may interact with your medicine. What should I watch for while using this medicine? Visit your doctor for checks on your progress. This drug  may make you feel generally unwell. This is not uncommon, as chemotherapy can affect healthy cells as well as cancer cells. Report any side effects. Continue your course of treatment even though you feel ill unless your doctor tells you to stop. In some cases, you may be given additional medicines to help with side effects. Follow all directions for their use. Call your doctor or health care professional for advice if you get a fever, chills or sore throat, or other symptoms of a cold or flu. Do not treat yourself. This drug decreases your body's ability to fight infections. Try to avoid being around people who are sick. This medicine may increase your risk to bruise or bleed. Call your doctor or health care professional if you notice any unusual bleeding. Do not become pregnant while taking this medicine or for at least 1 month after stopping it. Women should inform their doctor if they wish to become pregnant or think they might be pregnant. Men should not father a child while taking this medicine and for at least 2 months after stopping it. There is a potential for serious side effects to an unborn child. Talk to your health care professional or pharmacist for more information. Do not breast-feed an infant while taking this medicine. What side effects may I notice from receiving this medicine? Side effects that you should report to your doctor or health care professional as soon as possible: -low blood counts - this medicine may decrease the number of white blood cells, red blood cells and platelets. You may be at increased risk for infections and bleeding. -signs of infection - fever or chills, cough, sore throat,  pain or difficulty passing urine -signs of decreased platelets or bleeding - bruising, pinpoint red spots on the skin, black, tarry stools, blood in the urine -signs of decreased red blood cells - unusual weakness or tiredness, fainting spells, lightheadedness -increased blood sugar Side  effects that usually do not require medical attention (report to your prescriber or health care professional if they continue or are bothersome): -constipation -diarrhea -headache -loss of appetite -nausea, vomiting -skin rash, itching -stomach pain -water retention -weak or tired This list may not describe all possible side effects. Call your doctor for medical advice about side effects. You may report side effects to FDA at 1-800-FDA-1088. Where should I keep my medicine? This drug is given in a hospital or clinic and will not be stored at home. NOTE: This sheet is a summary. It may not cover all possible information. If you have questions about this medicine, talk to your doctor, pharmacist, or health care provider.    2016, Elsevier/[employer] Standard. (2014-12-28 12:56:02)

## 2015-12-07 ENCOUNTER — Encounter: Payer: Self-pay | Admitting: Hematology & Oncology

## 2015-12-09 ENCOUNTER — Other Ambulatory Visit: Payer: Self-pay | Admitting: Nurse Practitioner

## 2015-12-09 DIAGNOSIS — D4620 Refractory anemia with excess of blasts, unspecified: Secondary | ICD-10-CM

## 2015-12-09 DIAGNOSIS — D509 Iron deficiency anemia, unspecified: Secondary | ICD-10-CM

## 2015-12-12 ENCOUNTER — Ambulatory Visit (HOSPITAL_BASED_OUTPATIENT_CLINIC_OR_DEPARTMENT_OTHER): Payer: Medicare Other

## 2015-12-12 ENCOUNTER — Ambulatory Visit (HOSPITAL_COMMUNITY)
Admission: RE | Admit: 2015-12-12 | Discharge: 2015-12-12 | Disposition: A | Discharge status: Home / Self Care | Payer: Medicare Other | Source: Ambulatory Visit | Attending: Hematology & Oncology | Admitting: Hematology & Oncology

## 2015-12-12 ENCOUNTER — Other Ambulatory Visit (HOSPITAL_BASED_OUTPATIENT_CLINIC_OR_DEPARTMENT_OTHER): Payer: Medicare Other

## 2015-12-12 VITALS — BP 169/87 | HR 85 | Temp 97.8°F | Resp 18

## 2015-12-12 DIAGNOSIS — D4620 Refractory anemia with excess of blasts, unspecified: Secondary | ICD-10-CM | POA: Insufficient documentation

## 2015-12-12 DIAGNOSIS — D638 Anemia in other chronic diseases classified elsewhere: Secondary | ICD-10-CM

## 2015-12-12 DIAGNOSIS — D509 Iron deficiency anemia, unspecified: Secondary | ICD-10-CM

## 2015-12-12 DIAGNOSIS — D4622 Refractory anemia with excess of blasts 2: Secondary | ICD-10-CM | POA: Diagnosis not present

## 2015-12-12 LAB — CBC WITH DIFFERENTIAL (CANCER CENTER ONLY)
BASO#: 0 10*3/uL (ref 0.0–0.2)
BASO%: 0.6 % (ref 0.0–2.0)
EOS%: 1.3 % (ref 0.0–7.0)
Eosinophils Absolute: 0 10*3/uL (ref 0.0–0.5)
HCT: 22.2 % — ABNORMAL LOW (ref 38.7–49.9)
HGB: 7.5 g/dL — ABNORMAL LOW (ref 13.0–17.1)
LYMPH#: 0.7 10*3/uL — ABNORMAL LOW (ref 0.9–3.3)
LYMPH%: 43.8 % (ref 14.0–48.0)
MCH: 35.4 pg — AB (ref 28.0–33.4)
MCHC: 33.8 g/dL (ref 32.0–35.9)
MCV: 105 fL — AB (ref 82–98)
MONO#: 0.2 10*3/uL (ref 0.1–0.9)
MONO%: 11.3 % (ref 0.0–13.0)
NEUT#: 0.7 10*3/uL — ABNORMAL LOW (ref 1.5–6.5)
NEUT%: 43 % (ref 40.0–80.0)
PLATELETS: 40 10*3/uL — AB (ref 145–400)
RBC: 2.12 10*6/uL — AB (ref 4.20–5.70)
RDW: 22.7 % — AB (ref 11.1–15.7)
WBC: 1.6 10*3/uL — AB (ref 4.0–10.0)

## 2015-12-12 LAB — TECHNOLOGIST REVIEW CHCC SATELLITE

## 2015-12-12 MED ORDER — DIPHENHYDRAMINE HCL 25 MG PO CAPS
ORAL_CAPSULE | ORAL | Status: AC
  Filled 2015-12-12: qty 1

## 2015-12-12 MED ORDER — SODIUM CHLORIDE 0.9% FLUSH
10.0000 mL | INTRAVENOUS | Status: AC | PRN
  Administered 2015-12-12: 10 mL
  Filled 2015-12-12: qty 10

## 2015-12-12 MED ORDER — ACETAMINOPHEN 325 MG PO TABS
ORAL_TABLET | ORAL | Status: AC
  Filled 2015-12-12: qty 2

## 2015-12-12 MED ORDER — FUROSEMIDE 10 MG/ML IJ SOLN: 20.0000 mg | Freq: Once | INTRAMUSCULAR | Status: DC

## 2015-12-12 MED ORDER — DIPHENHYDRAMINE HCL 25 MG PO CAPS
25.0000 mg | ORAL_CAPSULE | Freq: Once | ORAL | Status: AC
  Administered 2015-12-12: 25 mg via ORAL

## 2015-12-12 MED ORDER — SODIUM CHLORIDE 0.9 % IV SOLN
250.0000 mL | Freq: Once | INTRAVENOUS | Status: AC
  Administered 2015-12-12: 250 mL via INTRAVENOUS

## 2015-12-12 MED ORDER — HEPARIN SOD (PORK) LOCK FLUSH 100 UNIT/ML IV SOLN
250.0000 [IU] | INTRAVENOUS | Status: AC | PRN
  Administered 2015-12-12: 500 [IU]
  Filled 2015-12-12: qty 5

## 2015-12-12 MED ORDER — ACETAMINOPHEN 325 MG PO TABS
650.0000 mg | ORAL_TABLET | Freq: Once | ORAL | Status: AC
  Administered 2015-12-12: 650 mg via ORAL

## 2015-12-12 NOTE — Patient Instructions (Signed)

## 2015-12-14 ENCOUNTER — Encounter: Payer: Self-pay | Admitting: Hematology & Oncology

## 2015-12-14 ENCOUNTER — Ambulatory Visit (HOSPITAL_BASED_OUTPATIENT_CLINIC_OR_DEPARTMENT_OTHER): Payer: Medicare Other

## 2015-12-14 ENCOUNTER — Other Ambulatory Visit: Payer: Self-pay | Admitting: Family

## 2015-12-14 VITALS — BP 139/72 | HR 97 | Temp 98.0°F | Resp 16

## 2015-12-14 DIAGNOSIS — D4620 Refractory anemia with excess of blasts, unspecified: Secondary | ICD-10-CM | POA: Diagnosis not present

## 2015-12-14 DIAGNOSIS — D638 Anemia in other chronic diseases classified elsewhere: Secondary | ICD-10-CM

## 2015-12-14 DIAGNOSIS — D509 Iron deficiency anemia, unspecified: Secondary | ICD-10-CM

## 2015-12-14 DIAGNOSIS — D4622 Refractory anemia with excess of blasts 2: Secondary | ICD-10-CM | POA: Diagnosis not present

## 2015-12-14 MED ORDER — DIPHENHYDRAMINE HCL 25 MG PO CAPS
ORAL_CAPSULE | ORAL | Status: AC
  Filled 2015-12-14: qty 1

## 2015-12-14 MED ORDER — HEPARIN SOD (PORK) LOCK FLUSH 100 UNIT/ML IV SOLN
500.0000 [IU] | Freq: Every day | INTRAVENOUS | Status: AC | PRN
  Administered 2015-12-14: 500 [IU]
  Filled 2015-12-14: qty 5

## 2015-12-14 MED ORDER — SODIUM CHLORIDE 0.9% FLUSH
10.0000 mL | INTRAVENOUS | Status: AC | PRN
  Administered 2015-12-14: 10 mL
  Filled 2015-12-14: qty 10

## 2015-12-14 MED ORDER — ACETAMINOPHEN 325 MG PO TABS
ORAL_TABLET | ORAL | Status: AC
  Filled 2015-12-14: qty 2

## 2015-12-14 MED ORDER — SODIUM CHLORIDE 0.9 % IV SOLN
250.0000 mL | Freq: Once | INTRAVENOUS | Status: AC
  Administered 2015-12-14: 250 mL via INTRAVENOUS

## 2015-12-14 MED ORDER — DIPHENHYDRAMINE HCL 25 MG PO CAPS
25.0000 mg | ORAL_CAPSULE | Freq: Once | ORAL | Status: AC
  Administered 2015-12-14: 25 mg via ORAL

## 2015-12-14 MED ORDER — ACETAMINOPHEN 325 MG PO TABS
650.0000 mg | ORAL_TABLET | Freq: Once | ORAL | Status: AC
  Administered 2015-12-14: 650 mg via ORAL

## 2015-12-15 ENCOUNTER — Encounter: Payer: Self-pay | Admitting: Hematology & Oncology

## 2015-12-15 LAB — TYPE AND SCREEN
ABO/RH(D): O POS
Antibody Screen: NEGATIVE
UNIT DIVISION: 0
Unit division: 0

## 2015-12-19 ENCOUNTER — Ambulatory Visit (HOSPITAL_BASED_OUTPATIENT_CLINIC_OR_DEPARTMENT_OTHER): Payer: Medicare Other | Admitting: Nurse Practitioner

## 2015-12-19 ENCOUNTER — Ambulatory Visit: Payer: Medicare Other

## 2015-12-19 DIAGNOSIS — Z452 Encounter for adjustment and management of vascular access device: Secondary | ICD-10-CM | POA: Diagnosis not present

## 2015-12-19 DIAGNOSIS — D4620 Refractory anemia with excess of blasts, unspecified: Secondary | ICD-10-CM

## 2015-12-19 DIAGNOSIS — D4622 Refractory anemia with excess of blasts 2: Secondary | ICD-10-CM | POA: Diagnosis not present

## 2015-12-19 LAB — FERRITIN

## 2015-12-19 LAB — TECHNOLOGIST REVIEW CHCC SATELLITE

## 2015-12-19 LAB — CBC WITH DIFFERENTIAL (CANCER CENTER ONLY)
BASO#: 0 10*3/uL (ref 0.0–0.2)
BASO%: 1.5 % (ref 0.0–2.0)
EOS%: 0.7 % (ref 0.0–7.0)
Eosinophils Absolute: 0 10*3/uL (ref 0.0–0.5)
HEMATOCRIT: 30.4 % — AB (ref 38.7–49.9)
HEMOGLOBIN: 10.2 g/dL — AB (ref 13.0–17.1)
LYMPH#: 0.7 10*3/uL — AB (ref 0.9–3.3)
LYMPH%: 48.9 % — ABNORMAL HIGH (ref 14.0–48.0)
MCH: 34.3 pg — ABNORMAL HIGH (ref 28.0–33.4)
MCHC: 33.6 g/dL (ref 32.0–35.9)
MCV: 102 fL — AB (ref 82–98)
MONO#: 0.4 10*3/uL (ref 0.1–0.9)
MONO%: 28.5 % — ABNORMAL HIGH (ref 0.0–13.0)
NEUT%: 20.4 % — ABNORMAL LOW (ref 40.0–80.0)
NEUTROS ABS: 0.3 10*3/uL — AB (ref 1.5–6.5)
Platelets: 99 10*3/uL — ABNORMAL LOW (ref 145–400)
RBC: 2.97 10*6/uL — ABNORMAL LOW (ref 4.20–5.70)
RDW: 20.1 % — ABNORMAL HIGH (ref 11.1–15.7)
WBC: 1.4 10*3/uL — ABNORMAL LOW (ref 4.0–10.0)

## 2015-12-19 LAB — CMP (CANCER CENTER ONLY)
ALBUMIN: 3.2 g/dL — AB (ref 3.3–5.5)
ALK PHOS: 42 U/L (ref 26–84)
ALT: 23 U/L (ref 10–47)
AST: 23 U/L (ref 11–38)
BILIRUBIN TOTAL: 0.6 mg/dL (ref 0.20–1.60)
BUN, Bld: 21 mg/dL (ref 7–22)
CALCIUM: 9.1 mg/dL (ref 8.0–10.3)
CO2: 26 mEq/L (ref 18–33)
Chloride: 103 mEq/L (ref 98–108)
Creat: 1.5 mg/dl — ABNORMAL HIGH (ref 0.6–1.2)
GLUCOSE: 102 mg/dL (ref 73–118)
Potassium: 3.5 mEq/L (ref 3.3–4.7)
Sodium: 135 mEq/L (ref 128–145)
TOTAL PROTEIN: 7 g/dL (ref 6.4–8.1)

## 2015-12-19 LAB — IRON AND TIBC
%SAT: 46 % (ref 20–55)
IRON: 119 ug/dL (ref 42–163)
TIBC: 259 ug/dL (ref 202–409)
UIBC: 140 ug/dL (ref 117–376)

## 2015-12-19 LAB — CHCC SATELLITE - SMEAR

## 2015-12-19 LAB — LACTATE DEHYDROGENASE: LDH: 260 U/L — AB (ref 125–245)

## 2015-12-19 MED ORDER — HEPARIN SOD (PORK) LOCK FLUSH 100 UNIT/ML IV SOLN
500.0000 [IU] | Freq: Once | INTRAVENOUS | Status: DC
  Filled 2015-12-19: qty 5

## 2015-12-19 MED ORDER — SODIUM CHLORIDE 0.9% FLUSH
10.0000 mL | INTRAVENOUS | Status: DC | PRN
  Administered 2015-12-19: 10 mL via INTRAVENOUS
  Filled 2015-12-19: qty 10

## 2015-12-19 MED ORDER — SODIUM CHLORIDE 0.9% FLUSH
10.0000 mL | INTRAVENOUS | Status: DC | PRN
  Filled 2015-12-19: qty 10

## 2015-12-19 MED ORDER — HEPARIN SOD (PORK) LOCK FLUSH 100 UNIT/ML IV SOLN
500.0000 [IU] | Freq: Once | INTRAVENOUS | Status: AC
  Administered 2015-12-19: 500 [IU] via INTRAVENOUS
  Filled 2015-12-19: qty 5

## 2015-12-19 NOTE — Patient Instructions (Signed)
Implanted Port Insertion, Care After °Refer to this sheet in the next few weeks. These instructions provide you with information on caring for yourself after your procedure. Your health care provider may also give you more specific instructions. Your treatment has been planned according to current medical practices, but problems sometimes occur. Call your health care provider if you have any problems or questions after your procedure. °WHAT TO EXPECT AFTER THE PROCEDURE °After your procedure, it is typical to have the following:  °· Discomfort at the port insertion site. Ice packs to the area will help. °· Bruising on the skin over the port. This will subside in 3-4 days. °HOME CARE INSTRUCTIONS °· After your port is placed, you will get a manufacturer's information card. The card has information about your port. Keep this card with you at all times.   °· Know what kind of port you have. There are many types of ports available.   °· Wear a medical alert bracelet in case of an emergency. This can help alert health care workers that you have a port.   °· The port can stay in for as long as your health care provider believes it is necessary.   °· A home health care nurse may give medicines and take care of the port.   °· You or a family member can get special training and directions for giving medicine and taking care of the port at home.   °SEEK MEDICAL CARE IF:  °· Your port does not flush or you are unable to get a blood return.   °· You have a fever or chills. °SEEK IMMEDIATE MEDICAL CARE IF: °· You have new fluid or pus coming from your incision.   °· You notice a bad smell coming from your incision site.   °· You have swelling, pain, or more redness at the incision or port site.   °· You have chest pain or shortness of breath. °  °This information is not intended to replace advice given to you by your health care provider. Make sure you discuss any questions you have with your health care provider. °  °Document  Released: 03/18/2013 Document Revised: 06/02/2013 Document Reviewed: 03/18/2013 °Elsevier Interactive Patient Education ©2016 Elsevier [employer]. ° °

## 2015-12-20 LAB — RETICULOCYTES: RETICULOCYTE COUNT: 1.6 % (ref 0.6–2.6)

## 2015-12-23 ENCOUNTER — Other Ambulatory Visit: Payer: Self-pay

## 2015-12-23 DIAGNOSIS — D4620 Refractory anemia with excess of blasts, unspecified: Secondary | ICD-10-CM

## 2015-12-26 ENCOUNTER — Other Ambulatory Visit (HOSPITAL_BASED_OUTPATIENT_CLINIC_OR_DEPARTMENT_OTHER): Payer: Medicare Other

## 2015-12-26 ENCOUNTER — Ambulatory Visit (HOSPITAL_BASED_OUTPATIENT_CLINIC_OR_DEPARTMENT_OTHER): Payer: Medicare Other | Admitting: Family

## 2015-12-26 ENCOUNTER — Ambulatory Visit (HOSPITAL_BASED_OUTPATIENT_CLINIC_OR_DEPARTMENT_OTHER): Payer: Medicare Other

## 2015-12-26 ENCOUNTER — Encounter: Payer: Self-pay | Admitting: Family

## 2015-12-26 VITALS — BP 154/66 | HR 98 | Temp 97.4°F | Resp 16 | Ht 72.0 in | Wt 206.0 lb

## 2015-12-26 DIAGNOSIS — D509 Iron deficiency anemia, unspecified: Secondary | ICD-10-CM

## 2015-12-26 DIAGNOSIS — D4620 Refractory anemia with excess of blasts, unspecified: Secondary | ICD-10-CM

## 2015-12-26 DIAGNOSIS — D4621 Refractory anemia with excess of blasts 1: Secondary | ICD-10-CM

## 2015-12-26 DIAGNOSIS — Z5111 Encounter for antineoplastic chemotherapy: Secondary | ICD-10-CM

## 2015-12-26 LAB — CMP (CANCER CENTER ONLY)
ALK PHOS: 47 U/L (ref 26–84)
ALT: 24 U/L (ref 10–47)
AST: 26 U/L (ref 11–38)
Albumin: 3.1 g/dL — ABNORMAL LOW (ref 3.3–5.5)
BUN: 23 mg/dL — AB (ref 7–22)
CO2: 28 meq/L (ref 18–33)
Calcium: 8.9 mg/dL (ref 8.0–10.3)
Chloride: 99 mEq/L (ref 98–108)
Creat: 1.4 mg/dl — ABNORMAL HIGH (ref 0.6–1.2)
GLUCOSE: 149 mg/dL — AB (ref 73–118)
POTASSIUM: 3.9 meq/L (ref 3.3–4.7)
Sodium: 133 mEq/L (ref 128–145)
Total Bilirubin: 0.6 mg/dl (ref 0.20–1.60)
Total Protein: 6.8 g/dL (ref 6.4–8.1)

## 2015-12-26 LAB — CBC WITH DIFFERENTIAL (CANCER CENTER ONLY)
BASO#: 0 10*3/uL (ref 0.0–0.2)
BASO%: 1.6 % (ref 0.0–2.0)
EOS%: 0 % (ref 0.0–7.0)
Eosinophils Absolute: 0 10*3/uL (ref 0.0–0.5)
HCT: 30 % — ABNORMAL LOW (ref 38.7–49.9)
HGB: 10.1 g/dL — ABNORMAL LOW (ref 13.0–17.1)
LYMPH#: 0.4 10*3/uL — ABNORMAL LOW (ref 0.9–3.3)
LYMPH%: 27.1 % (ref 14.0–48.0)
MCH: 35.4 pg — ABNORMAL HIGH (ref 28.0–33.4)
MCHC: 33.7 g/dL (ref 32.0–35.9)
MCV: 105 fL — AB (ref 82–98)
MONO#: 0.1 10*3/uL (ref 0.1–0.9)
MONO%: 10.9 % (ref 0.0–13.0)
NEUT#: 0.8 10*3/uL — ABNORMAL LOW (ref 1.5–6.5)
NEUT%: 60.4 % (ref 40.0–80.0)
PLATELETS: 362 10*3/uL (ref 145–400)
RBC: 2.85 10*6/uL — ABNORMAL LOW (ref 4.20–5.70)
RDW: 21.6 % — AB (ref 11.1–15.7)
WBC: 1.3 10*3/uL — ABNORMAL LOW (ref 4.0–10.0)

## 2015-12-26 LAB — TECHNOLOGIST REVIEW CHCC SATELLITE

## 2015-12-26 MED ORDER — SODIUM CHLORIDE 0.9% FLUSH
10.0000 mL | INTRAVENOUS | Status: DC | PRN
  Administered 2015-12-26: 10 mL
  Filled 2015-12-26: qty 10

## 2015-12-26 MED ORDER — PROCHLORPERAZINE MALEATE 10 MG PO TABS
ORAL_TABLET | ORAL | Status: AC
  Filled 2015-12-26: qty 1

## 2015-12-26 MED ORDER — HEPARIN SOD (PORK) LOCK FLUSH 100 UNIT/ML IV SOLN
500.0000 [IU] | Freq: Once | INTRAVENOUS | Status: AC | PRN
  Administered 2015-12-26: 500 [IU]
  Filled 2015-12-26: qty 5

## 2015-12-26 MED ORDER — PROCHLORPERAZINE MALEATE 10 MG PO TABS
10.0000 mg | ORAL_TABLET | Freq: Once | ORAL | Status: AC
  Administered 2015-12-26: 10 mg via ORAL

## 2015-12-26 MED ORDER — SODIUM CHLORIDE 0.9 % IV SOLN
Freq: Once | INTRAVENOUS | Status: AC
  Administered 2015-12-26: 13:00:00 via INTRAVENOUS

## 2015-12-26 MED ORDER — SODIUM CHLORIDE 0.9 % IV SOLN
20.0000 mg/m2 | Freq: Once | INTRAVENOUS | Status: AC
  Administered 2015-12-26: 45 mg via INTRAVENOUS
  Filled 2015-12-26: qty 9

## 2015-12-26 NOTE — Patient Instructions (Signed)
Decitabine injection for infusion What is this medicine? DECITABINE (dee SYE ta been) is a chemotherapy drug. This medicine reduces the growth of cancer cells. It is used to treat adults with myelodysplastic syndromes. This medicine may be used for other purposes; ask your health care provider or pharmacist if you have questions. What should I tell my health care provider before I take this medicine? They need to know if you have any of these conditions: -infection (especially a virus infection such as chickenpox, cold sores, or herpes) -kidney disease -liver disease -an unusual or allergic reaction to decitabine, other medicines, foods, dyes, or preservatives -pregnant or trying to get pregnant -breast-feeding How should I use this medicine? This medicine is for infusion into a vein. It is administered in a hospital or clinic by a doctor or health care professional. Talk to your pediatrician regarding the use of this medicine in children. Special care may be needed. Overdosage: If you think you have taken too much of this medicine contact a poison control center or emergency room at once. NOTE: This medicine is only for you. Do not share this medicine with others. What if I miss a dose? It is important not to miss your dose. Call your doctor or health care professional if you are unable to keep an appointment. What may interact with this medicine? -vaccines Talk to your doctor or health care professional before taking any of these medicines: -aspirin -acetaminophen -ibuprofen -ketoprofen -naproxen This list may not describe all possible interactions. Give your health care provider a list of all the medicines, herbs, non-prescription drugs, or dietary supplements you use. Also tell them if you smoke, drink alcohol, or use illegal drugs. Some items may interact with your medicine. What should I watch for while using this medicine? Visit your doctor for checks on your progress. This drug  may make you feel generally unwell. This is not uncommon, as chemotherapy can affect healthy cells as well as cancer cells. Report any side effects. Continue your course of treatment even though you feel ill unless your doctor tells you to stop. In some cases, you may be given additional medicines to help with side effects. Follow all directions for their use. Call your doctor or health care professional for advice if you get a fever, chills or sore throat, or other symptoms of a cold or flu. Do not treat yourself. This drug decreases your body's ability to fight infections. Try to avoid being around people who are sick. This medicine may increase your risk to bruise or bleed. Call your doctor or health care professional if you notice any unusual bleeding. Do not become pregnant while taking this medicine or for at least 1 month after stopping it. Women should inform their doctor if they wish to become pregnant or think they might be pregnant. Men should not father a child while taking this medicine and for at least 2 months after stopping it. There is a potential for serious side effects to an unborn child. Talk to your health care professional or pharmacist for more information. Do not breast-feed an infant while taking this medicine. What side effects may I notice from receiving this medicine? Side effects that you should report to your doctor or health care professional as soon as possible: -low blood counts - this medicine may decrease the number of white blood cells, red blood cells and platelets. You may be at increased risk for infections and bleeding. -signs of infection - fever or chills, cough, sore throat,  pain or difficulty passing urine -signs of decreased platelets or bleeding - bruising, pinpoint red spots on the skin, black, tarry stools, blood in the urine -signs of decreased red blood cells - unusual weakness or tiredness, fainting spells, lightheadedness -increased blood sugar Side  effects that usually do not require medical attention (report to your prescriber or health care professional if they continue or are bothersome): -constipation -diarrhea -headache -loss of appetite -nausea, vomiting -skin rash, itching -stomach pain -water retention -weak or tired This list may not describe all possible side effects. Call your doctor for medical advice about side effects. You may report side effects to FDA at 1-800-FDA-1088. Where should I keep my medicine? This drug is given in a hospital or clinic and will not be stored at home. NOTE: This sheet is a summary. It may not cover all possible information. If you have questions about this medicine, talk to your doctor, pharmacist, or health care provider.    2016, Elsevier/[employer] Standard. (2014-12-28 12:56:02)

## 2015-12-26 NOTE — Progress Notes (Signed)
12:48 PM OK to treat with labs 12/26/15 per Dr. Marin Olp, ANC 0.8 and WBC 1.3.

## 2015-12-26 NOTE — Progress Notes (Signed)
Hematology and Oncology Follow Up Visit  Anthony [name] [id number] [birth year] 75 y.o. 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 He is doing well but still having problems with balance and sensitivity to light. He was diagnosed with atypical migraine syndrome. Thankfully he has had no falls or syncopal episodes. He uses a cane to ambulate.  He received blood earlier this month and tolerated nicely. His Hgb is stable at 10.1 and an MCV of 105. Ferritin was 2,900 and total iron was 119.  No episodes of bleeding or bruising. No lymphadenopathy found on exam.  No fever, chills, n/v, cough, rash, headache, chest pain, palpitations, night sweats, abdominal pain or changes in bowel or bladder habits.  He has oaccasional SOB with exertion due to emphysema. He takes breaks to rest as needed.  No swelling or tenderness in his extremities.The numbness and tingling in his toes is unchanged. He denies joint aches or "bone" pain. He is using a cane now to ambulate and this has helped with his balance. He has had no falls or syncopal episodes.  His appetite is improving and he is staying well hydrated. He still supplements with boost daily. His weight is unchanged.   Medications:    Medication List       This list is accurate as of: 12/26/15 12:15 PM.  Always use your most recent med list.               bimatoprost 0.01 % Soln  Commonly known as:  LUMIGAN  Place 1 drop into both eyes at bedtime.     budesonide-formoterol 160-4.5 MCG/ACT inhaler  Commonly known as:  SYMBICORT  Inhale 2 puffs into the lungs 2 (two) times daily.     buPROPion 150 MG 24 hr tablet  Commonly known as:  WELLBUTRIN XL  Take  150 mg by mouth daily.     clopidogrel 75 MG tablet  Commonly known as:  PLAVIX  Take 75 mg by mouth daily.     co-enzyme Q-10 30 MG capsule  Take 100 mg by mouth daily.     Cyanocobalamin 2500 MCG Tabs  Take 5,000 mcg by mouth daily.     fish oil-omega-3 fatty acids 1000 MG capsule  Take 1 g by mouth daily.     Flax Seed Oil 1000 MG Caps  Take 1,000 mg by mouth daily.     LORazepam 0.5 MG tablet  Commonly known as:  ATIVAN  Take 1 tablet (0.5 mg total) by mouth every 6 (six) hours as needed (Nausea or vomiting).     pantoprazole 40 MG tablet  Commonly known as:  PROTONIX  Take 40 mg by mouth daily.     predniSONE 20 MG tablet  Commonly known as:  DELTASONE  Take 1 tablet (20 mg total) by mouth daily with breakfast.     PROAIR RESPICLICK 578 (90 Base) MCG/ACT Aepb  Generic drug:  Albuterol Sulfate  Inhale 2 puffs into the lungs every 6 (six) hours as needed.     prochlorperazine 10 MG tablet  Commonly known as:  COMPAZINE  Take 1 tablet (10 mg total) by mouth every 6 (six) hours  as needed (Nausea or vomiting).     ranitidine 300 MG tablet  Commonly known as:  ZANTAC  Take 300 mg by mouth at bedtime.     sertraline 100 MG tablet  Commonly known as:  ZOLOFT  Take 100 mg by mouth daily.     SIMBRINZA 1-0.2 % Susp  Generic drug:  Brinzolamide-Brimonidine     simvastatin 40 MG tablet  Commonly known as:  ZOCOR  Take 40 mg by mouth every other day.     tiotropium 18 MCG inhalation capsule  Commonly known as:  SPIRIVA  Place 18 mcg into inhaler and inhale daily.     Vitamin D3 5000 units Tabs  Take by mouth daily.        Allergies: No Known Allergies  Past Medical History, Surgical history, Social history, and Family History were reviewed and updated.  Review of Systems: All other 10 point review of systems is negative.   Physical Exam:  height is 6' (1.829 m) and weight is 206 lb (93.441 kg). His oral temperature is 97.4 F (36.3 C). His blood  pressure is 154/66 and his pulse is 98. His respiration is 16.   Wt Readings from Last 3 Encounters:  12/26/15 206 lb (93.441 kg)  11/21/15 205 lb (92.987 kg)  11/15/15 201 lb 9 oz (91.428 kg)    Ocular: Sclerae unicteric, pupils equal, round and reactive to light Ear-nose-throat: Oropharynx clear, dentition fair Lymphatic: No cervical supraclavicular or axillary adenopathy Lungs no rales or rhonchi, good excursion bilaterally Heart regular rate and rhythm, no murmur appreciated Abd soft, nontender, positive bowel sounds, no oliver or spleen tip palpated on exam, no fluid wave MSK no focal spinal tenderness, no joint edema Neuro: non-focal, well-oriented, appropriate affect  Lab Results  Component Value Date   WBC 1.3* 12/26/2015   HGB 10.1* 12/26/2015   HCT 30.0* 12/26/2015   MCV 105* 12/26/2015   PLT 362 12/26/2015   Lab Results  Component Value Date   FERRITIN 2,977* 12/19/2015   IRON 119 12/19/2015   TIBC 259 12/19/2015   UIBC 140 12/19/2015   IRONPCTSAT 46 12/19/2015   Lab Results  Component Value Date   RETICCTPCT 1.7 05/30/2015   RBC 2.85* 12/26/2015   RETICCTABS 39.4 05/30/2015   No results found for: KPAFRELGTCHN, LAMBDASER, KAPLAMBRATIO No results found for: Kandis Cocking [facility] Lab Results  Component Value Date   TOTALPROTELP 7.3 01/01/2014   ALBUMINELP 55.1* 01/01/2014   A1GS 6.9* 01/01/2014   A2GS 8.9 01/01/2014   BETS 7.9* 01/01/2014   BETA2SER 6.7* 01/01/2014   GAMS 14.5 01/01/2014   MSPIKE NOT DET 01/01/2014   SPEI * 01/01/2014     Chemistry      Component Value Date/Time   NA 135 12/19/2015 0928   NA 140 11/08/2015 0948   NA 141 06/17/2015 1155   K 3.5 12/19/2015 0928   K 3.9 11/08/2015 0948   K 4.4 06/17/2015 1155   CL 103 12/19/2015 0928   CL 107 06/17/2015 1155   CO2 26 12/19/2015 0928   CO2 24 11/08/2015 0948   CO2 25 06/17/2015 1155   BUN 21 12/19/2015 0928   BUN 21.9 11/08/2015 0948   BUN 27* 06/17/2015 1155   CREATININE  1.5* 12/19/2015 0928   CREATININE 1.4* 11/08/2015 0948   CREATININE 1.48* 06/17/2015 1155      Component Value Date/Time   CALCIUM 9.1 12/19/2015 0928   CALCIUM 9.3 11/08/2015 0948   CALCIUM 9.0 06/17/2015 1155  ALKPHOS 42 12/19/2015 0928   ALKPHOS 45 11/08/2015 0948   ALKPHOS 36* 10/26/2014 1111   AST 23 12/19/2015 0928   AST 15 11/08/2015 0948   AST 19 10/26/2014 1111   ALT 23 12/19/2015 0928   ALT 13 11/08/2015 0948   ALT 15 10/26/2014 1111   BILITOT 0.60 12/19/2015 0928   BILITOT 0.48 11/08/2015 0948   BILITOT 0.4 10/26/2014 1111     Impression and Plan: Mr. Crisco is 75 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  His bone marrow biopsy in June showed refractory anemia with excess blasts - 2. He tolerated his first cycle of Decitabine nicely. His Hgb has remained stable at 10.1. Ferritin was still high. No rash. WBC count is 1.3 with no s/s of infection. Platelets are 362. He has some fatigue at times but overall has felt well.  We will proceed with cycle 2 today as planned per Dr. Marin Olp.  He has his current treatment/appoinment schedule. We will see him back for follow-up on 8/14 with Dr. Marin Olp.  He will contact us with any questions or concerns. We can certainly see him sooner if need be.   Eliezer Bottom, NP 7/17/201712:15 PM

## 2015-12-27 ENCOUNTER — Ambulatory Visit (HOSPITAL_BASED_OUTPATIENT_CLINIC_OR_DEPARTMENT_OTHER): Payer: Medicare Other

## 2015-12-27 VITALS — BP 135/58 | HR 95 | Resp 18

## 2015-12-27 DIAGNOSIS — Z5111 Encounter for antineoplastic chemotherapy: Secondary | ICD-10-CM | POA: Diagnosis not present

## 2015-12-27 DIAGNOSIS — D4621 Refractory anemia with excess of blasts 1: Secondary | ICD-10-CM

## 2015-12-27 DIAGNOSIS — D4620 Refractory anemia with excess of blasts, unspecified: Secondary | ICD-10-CM

## 2015-12-27 MED ORDER — PROCHLORPERAZINE MALEATE 10 MG PO TABS
10.0000 mg | ORAL_TABLET | Freq: Once | ORAL | Status: AC
  Administered 2015-12-27: 10 mg via ORAL

## 2015-12-27 MED ORDER — HEPARIN SOD (PORK) LOCK FLUSH 100 UNIT/ML IV SOLN
500.0000 [IU] | Freq: Once | INTRAVENOUS | Status: AC | PRN
  Administered 2015-12-27: 500 [IU]
  Filled 2015-12-27: qty 5

## 2015-12-27 MED ORDER — SODIUM CHLORIDE 0.9 % IV SOLN
Freq: Once | INTRAVENOUS | Status: AC
  Administered 2015-12-27: 10:00:00 via INTRAVENOUS

## 2015-12-27 MED ORDER — SODIUM CHLORIDE 0.9 % IV SOLN
20.0000 mg/m2 | Freq: Once | INTRAVENOUS | Status: AC
  Administered 2015-12-27: 45 mg via INTRAVENOUS
  Filled 2015-12-27: qty 9

## 2015-12-27 MED ORDER — SODIUM CHLORIDE 0.9% FLUSH
10.0000 mL | INTRAVENOUS | Status: DC | PRN
  Administered 2015-12-27: 10 mL
  Filled 2015-12-27: qty 10

## 2015-12-27 MED ORDER — PROCHLORPERAZINE MALEATE 10 MG PO TABS
ORAL_TABLET | ORAL | Status: AC
  Filled 2015-12-27: qty 1

## 2015-12-27 MED FILL — predniSONE 20 MG TABS: 20 | 30 days supply | Qty: 30 | Fill #2

## 2015-12-27 NOTE — Patient Instructions (Signed)
Decitabine injection for infusion What is this medicine? DECITABINE (dee SYE ta been) is a chemotherapy drug. This medicine reduces the growth of cancer cells. It is used to treat adults with myelodysplastic syndromes. This medicine may be used for other purposes; ask your health care provider or pharmacist if you have questions. What should I tell my health care provider before I take this medicine? They need to know if you have any of these conditions: -infection (especially a virus infection such as chickenpox, cold sores, or herpes) -kidney disease -liver disease -an unusual or allergic reaction to decitabine, other medicines, foods, dyes, or preservatives -pregnant or trying to get pregnant -breast-feeding How should I use this medicine? This medicine is for infusion into a vein. It is administered in a hospital or clinic by a doctor or health care professional. Talk to your pediatrician regarding the use of this medicine in children. Special care may be needed. Overdosage: If you think you have taken too much of this medicine contact a poison control center or emergency room at once. NOTE: This medicine is only for you. Do not share this medicine with others. What if I miss a dose? It is important not to miss your dose. Call your doctor or health care professional if you are unable to keep an appointment. What may interact with this medicine? -vaccines Talk to your doctor or health care professional before taking any of these medicines: -aspirin -acetaminophen -ibuprofen -ketoprofen -naproxen This list may not describe all possible interactions. Give your health care provider a list of all the medicines, herbs, non-prescription drugs, or dietary supplements you use. Also tell them if you smoke, drink alcohol, or use illegal drugs. Some items may interact with your medicine. What should I watch for while using this medicine? Visit your doctor for checks on your progress. This drug  may make you feel generally unwell. This is not uncommon, as chemotherapy can affect healthy cells as well as cancer cells. Report any side effects. Continue your course of treatment even though you feel ill unless your doctor tells you to stop. In some cases, you may be given additional medicines to help with side effects. Follow all directions for their use. Call your doctor or health care professional for advice if you get a fever, chills or sore throat, or other symptoms of a cold or flu. Do not treat yourself. This drug decreases your body's ability to fight infections. Try to avoid being around people who are sick. This medicine may increase your risk to bruise or bleed. Call your doctor or health care professional if you notice any unusual bleeding. Do not become pregnant while taking this medicine or for at least 1 month after stopping it. Women should inform their doctor if they wish to become pregnant or think they might be pregnant. Men should not father a child while taking this medicine and for at least 2 months after stopping it. There is a potential for serious side effects to an unborn child. Talk to your health care professional or pharmacist for more information. Do not breast-feed an infant while taking this medicine. What side effects may I notice from receiving this medicine? Side effects that you should report to your doctor or health care professional as soon as possible: -low blood counts - this medicine may decrease the number of white blood cells, red blood cells and platelets. You may be at increased risk for infections and bleeding. -signs of infection - fever or chills, cough, sore throat,  pain or difficulty passing urine -signs of decreased platelets or bleeding - bruising, pinpoint red spots on the skin, black, tarry stools, blood in the urine -signs of decreased red blood cells - unusual weakness or tiredness, fainting spells, lightheadedness -increased blood sugar Side  effects that usually do not require medical attention (report to your prescriber or health care professional if they continue or are bothersome): -constipation -diarrhea -headache -loss of appetite -nausea, vomiting -skin rash, itching -stomach pain -water retention -weak or tired This list may not describe all possible side effects. Call your doctor for medical advice about side effects. You may report side effects to FDA at 1-800-FDA-1088. Where should I keep my medicine? This drug is given in a hospital or clinic and will not be stored at home. NOTE: This sheet is a summary. It may not cover all possible information. If you have questions about this medicine, talk to your doctor, pharmacist, or health care provider.    2016, Elsevier/[employer] Standard. (2014-12-28 12:56:02)

## 2015-12-28 ENCOUNTER — Ambulatory Visit (HOSPITAL_BASED_OUTPATIENT_CLINIC_OR_DEPARTMENT_OTHER): Payer: Medicare Other

## 2015-12-28 VITALS — BP 132/66 | HR 103 | Temp 98.2°F | Resp 16

## 2015-12-28 DIAGNOSIS — D4621 Refractory anemia with excess of blasts 1: Secondary | ICD-10-CM

## 2015-12-28 DIAGNOSIS — D4620 Refractory anemia with excess of blasts, unspecified: Secondary | ICD-10-CM

## 2015-12-28 DIAGNOSIS — Z5111 Encounter for antineoplastic chemotherapy: Secondary | ICD-10-CM | POA: Diagnosis not present

## 2015-12-28 MED ORDER — PROCHLORPERAZINE MALEATE 10 MG PO TABS
ORAL_TABLET | ORAL | Status: AC
  Filled 2015-12-28: qty 1

## 2015-12-28 MED ORDER — HEPARIN SOD (PORK) LOCK FLUSH 100 UNIT/ML IV SOLN
500.0000 [IU] | Freq: Once | INTRAVENOUS | Status: AC | PRN
  Administered 2015-12-28: 500 [IU]
  Filled 2015-12-28: qty 5

## 2015-12-28 MED ORDER — SODIUM CHLORIDE 0.9 % IV SOLN
Freq: Once | INTRAVENOUS | Status: AC
  Administered 2015-12-28: 09:00:00 via INTRAVENOUS

## 2015-12-28 MED ORDER — SODIUM CHLORIDE 0.9 % IV SOLN
20.0000 mg/m2 | Freq: Once | INTRAVENOUS | Status: AC
  Administered 2015-12-28: 45 mg via INTRAVENOUS
  Filled 2015-12-28: qty 9

## 2015-12-28 MED ORDER — PROCHLORPERAZINE MALEATE 10 MG PO TABS
10.0000 mg | ORAL_TABLET | Freq: Once | ORAL | Status: AC
  Administered 2015-12-28: 10 mg via ORAL

## 2015-12-28 MED ORDER — SODIUM CHLORIDE 0.9% FLUSH
10.0000 mL | INTRAVENOUS | Status: DC | PRN
  Administered 2015-12-28: 10 mL
  Filled 2015-12-28: qty 10

## 2015-12-28 NOTE — Patient Instructions (Signed)
[facility] Discharge Instructions for Patients Receiving Chemotherapy  Today you received the following chemotherapy agents Dacogen.  To help prevent nausea and vomiting after your treatment, we encourage you to take your nausea medication.   If you develop nausea and vomiting that is not controlled by your nausea medication, call the clinic.   BELOW ARE SYMPTOMS THAT SHOULD BE REPORTED IMMEDIATELY:  *FEVER GREATER THAN 100.5 F  *CHILLS WITH OR WITHOUT FEVER  NAUSEA AND VOMITING THAT IS NOT CONTROLLED WITH YOUR NAUSEA MEDICATION  *UNUSUAL SHORTNESS OF BREATH  *UNUSUAL BRUISING OR BLEEDING  TENDERNESS IN MOUTH AND THROAT WITH OR WITHOUT PRESENCE OF ULCERS  *URINARY PROBLEMS  *BOWEL PROBLEMS  UNUSUAL RASH Items with * indicate a potential emergency and should be followed up as soon as possible.  Feel free to call the clinic you have any questions or concerns. The clinic phone number is [phone].  Please show the CHEMO ALERT CARD at check-in to the Emergency Department and triage nurse.  

## 2015-12-29 ENCOUNTER — Ambulatory Visit (HOSPITAL_BASED_OUTPATIENT_CLINIC_OR_DEPARTMENT_OTHER): Payer: Medicare Other

## 2015-12-29 VITALS — BP 115/62 | HR 93 | Temp 98.2°F | Resp 16

## 2015-12-29 DIAGNOSIS — D4621 Refractory anemia with excess of blasts 1: Secondary | ICD-10-CM | POA: Diagnosis not present

## 2015-12-29 DIAGNOSIS — D4620 Refractory anemia with excess of blasts, unspecified: Secondary | ICD-10-CM

## 2015-12-29 DIAGNOSIS — Z5111 Encounter for antineoplastic chemotherapy: Secondary | ICD-10-CM

## 2015-12-29 MED ORDER — SODIUM CHLORIDE 0.9 % IV SOLN
Freq: Once | INTRAVENOUS | Status: AC
Start: 2015-12-29 — End: 2015-12-29
  Administered 2015-12-29: 10:00:00 via INTRAVENOUS

## 2015-12-29 MED ORDER — PROCHLORPERAZINE MALEATE 10 MG PO TABS
ORAL_TABLET | ORAL | Status: AC
  Filled 2015-12-29: qty 1

## 2015-12-29 MED ORDER — PROCHLORPERAZINE MALEATE 10 MG PO TABS
10.0000 mg | ORAL_TABLET | Freq: Once | ORAL | Status: AC
  Administered 2015-12-29: 10 mg via ORAL

## 2015-12-29 MED ORDER — SODIUM CHLORIDE 0.9% FLUSH
10.0000 mL | INTRAVENOUS | Status: DC | PRN
  Administered 2015-12-29: 10 mL
  Filled 2015-12-29: qty 10

## 2015-12-29 MED ORDER — HEPARIN SOD (PORK) LOCK FLUSH 100 UNIT/ML IV SOLN
500.0000 [IU] | Freq: Once | INTRAVENOUS | Status: AC | PRN
  Administered 2015-12-29: 500 [IU]
  Filled 2015-12-29: qty 5

## 2015-12-29 MED ORDER — SODIUM CHLORIDE 0.9 % IV SOLN
20.0000 mg/m2 | Freq: Once | INTRAVENOUS | Status: AC
  Administered 2015-12-29: 45 mg via INTRAVENOUS
  Filled 2015-12-29: qty 9

## 2015-12-29 NOTE — Patient Instructions (Signed)
[facility] Discharge Instructions for Patients Receiving Chemotherapy  Today you received the following chemotherapy agents Dacogen.  To help prevent nausea and vomiting after your treatment, we encourage you to take your nausea medication.   If you develop nausea and vomiting that is not controlled by your nausea medication, call the clinic.   BELOW ARE SYMPTOMS THAT SHOULD BE REPORTED IMMEDIATELY:  *FEVER GREATER THAN 100.5 F  *CHILLS WITH OR WITHOUT FEVER  NAUSEA AND VOMITING THAT IS NOT CONTROLLED WITH YOUR NAUSEA MEDICATION  *UNUSUAL SHORTNESS OF BREATH  *UNUSUAL BRUISING OR BLEEDING  TENDERNESS IN MOUTH AND THROAT WITH OR WITHOUT PRESENCE OF ULCERS  *URINARY PROBLEMS  *BOWEL PROBLEMS  UNUSUAL RASH Items with * indicate a potential emergency and should be followed up as soon as possible.  Feel free to call the clinic you have any questions or concerns. The clinic phone number is [phone].  Please show the CHEMO ALERT CARD at check-in to the Emergency Department and triage nurse.  

## 2015-12-30 ENCOUNTER — Ambulatory Visit (HOSPITAL_BASED_OUTPATIENT_CLINIC_OR_DEPARTMENT_OTHER): Payer: Medicare Other

## 2015-12-30 ENCOUNTER — Other Ambulatory Visit: Payer: Self-pay

## 2015-12-30 VITALS — BP 117/56 | HR 93 | Resp 16

## 2015-12-30 DIAGNOSIS — Z5111 Encounter for antineoplastic chemotherapy: Secondary | ICD-10-CM

## 2015-12-30 DIAGNOSIS — D4621 Refractory anemia with excess of blasts 1: Secondary | ICD-10-CM

## 2015-12-30 DIAGNOSIS — D509 Iron deficiency anemia, unspecified: Secondary | ICD-10-CM

## 2015-12-30 DIAGNOSIS — D4620 Refractory anemia with excess of blasts, unspecified: Secondary | ICD-10-CM

## 2015-12-30 MED ORDER — PROCHLORPERAZINE MALEATE 10 MG PO TABS
ORAL_TABLET | ORAL | Status: AC
Start: 2015-12-30 — End: 2015-12-30
  Filled 2015-12-30: qty 1

## 2015-12-30 MED ORDER — PROCHLORPERAZINE MALEATE 10 MG PO TABS
10.0000 mg | ORAL_TABLET | Freq: Once | ORAL | Status: AC
Start: 2015-12-30 — End: 2015-12-30
  Administered 2015-12-30: 10 mg via ORAL

## 2015-12-30 MED ORDER — SODIUM CHLORIDE 0.9 % IV SOLN
Freq: Once | INTRAVENOUS | Status: AC
  Administered 2015-12-30: 10:00:00 via INTRAVENOUS

## 2015-12-30 MED ORDER — SODIUM CHLORIDE 0.9 % IV SOLN
20.0000 mg/m2 | Freq: Once | INTRAVENOUS | Status: AC
  Administered 2015-12-30: 45 mg via INTRAVENOUS
  Filled 2015-12-30: qty 9

## 2015-12-30 MED ORDER — SODIUM CHLORIDE 0.9% FLUSH
10.0000 mL | INTRAVENOUS | Status: DC | PRN
  Administered 2015-12-30: 10 mL
  Filled 2015-12-30: qty 10

## 2015-12-30 MED ORDER — HEPARIN SOD (PORK) LOCK FLUSH 100 UNIT/ML IV SOLN
500.0000 [IU] | Freq: Once | INTRAVENOUS | Status: AC | PRN
Start: 2015-12-30 — End: 2015-12-30
  Administered 2015-12-30: 500 [IU]
  Filled 2015-12-30: qty 5

## 2016-01-02 ENCOUNTER — Ambulatory Visit (HOSPITAL_BASED_OUTPATIENT_CLINIC_OR_DEPARTMENT_OTHER): Payer: Medicare Other

## 2016-01-02 ENCOUNTER — Other Ambulatory Visit (HOSPITAL_BASED_OUTPATIENT_CLINIC_OR_DEPARTMENT_OTHER): Payer: Medicare Other

## 2016-01-02 VITALS — BP 124/62 | HR 98 | Temp 97.5°F | Resp 20

## 2016-01-02 DIAGNOSIS — Z5111 Encounter for antineoplastic chemotherapy: Secondary | ICD-10-CM | POA: Diagnosis not present

## 2016-01-02 DIAGNOSIS — D4621 Refractory anemia with excess of blasts 1: Secondary | ICD-10-CM

## 2016-01-02 DIAGNOSIS — D509 Iron deficiency anemia, unspecified: Secondary | ICD-10-CM

## 2016-01-02 DIAGNOSIS — D4620 Refractory anemia with excess of blasts, unspecified: Secondary | ICD-10-CM

## 2016-01-02 LAB — CBC WITH DIFFERENTIAL (CANCER CENTER ONLY)
BASO#: 0 10*3/uL (ref 0.0–0.2)
BASO%: 0.6 % (ref 0.0–2.0)
EOS ABS: 0 10*3/uL (ref 0.0–0.5)
EOS%: 0 % (ref 0.0–7.0)
HCT: 27.5 % — ABNORMAL LOW (ref 38.7–49.9)
HEMOGLOBIN: 9 g/dL — AB (ref 13.0–17.1)
LYMPH#: 0.7 10*3/uL — AB (ref 0.9–3.3)
LYMPH%: 43.6 % (ref 14.0–48.0)
MCH: 35.2 pg — AB (ref 28.0–33.4)
MCHC: 32.7 g/dL (ref 32.0–35.9)
MCV: 107 fL — AB (ref 82–98)
MONO#: 0 10*3/uL — AB (ref 0.1–0.9)
MONO%: 2.6 % (ref 0.0–13.0)
NEUT%: 53.2 % (ref 40.0–80.0)
NEUTROS ABS: 0.8 10*3/uL — AB (ref 1.5–6.5)
Platelets: 311 10*3/uL (ref 145–400)
RBC: 2.56 10*6/uL — AB (ref 4.20–5.70)
RDW: 21.6 % — ABNORMAL HIGH (ref 11.1–15.7)
WBC: 1.6 10*3/uL — AB (ref 4.0–10.0)

## 2016-01-02 LAB — CMP (CANCER CENTER ONLY)
ALK PHOS: 47 U/L (ref 26–84)
ALT: 28 U/L (ref 10–47)
AST: 25 U/L (ref 11–38)
Albumin: 2.9 g/dL — ABNORMAL LOW (ref 3.3–5.5)
BUN: 23 mg/dL — AB (ref 7–22)
CO2: 27 meq/L (ref 18–33)
Calcium: 8.9 mg/dL (ref 8.0–10.3)
Chloride: 106 mEq/L (ref 98–108)
Creat: 1.3 mg/dl — ABNORMAL HIGH (ref 0.6–1.2)
GLUCOSE: 106 mg/dL (ref 73–118)
POTASSIUM: 3.7 meq/L (ref 3.3–4.7)
Sodium: 137 mEq/L (ref 128–145)
Total Bilirubin: 0.7 mg/dl (ref 0.20–1.60)
Total Protein: 6.6 g/dL (ref 6.4–8.1)

## 2016-01-02 MED ORDER — PROCHLORPERAZINE MALEATE 10 MG PO TABS
10.0000 mg | ORAL_TABLET | Freq: Once | ORAL | Status: AC
  Administered 2016-01-02: 10 mg via ORAL

## 2016-01-02 MED ORDER — PROCHLORPERAZINE MALEATE 10 MG PO TABS
ORAL_TABLET | ORAL | Status: AC
  Filled 2016-01-02: qty 1

## 2016-01-02 MED ORDER — SODIUM CHLORIDE 0.9 % IV SOLN
Freq: Once | INTRAVENOUS | Status: AC
  Administered 2016-01-02: 11:00:00 via INTRAVENOUS

## 2016-01-02 MED ORDER — SODIUM CHLORIDE 0.9 % IV SOLN
20.0000 mg/m2 | Freq: Once | INTRAVENOUS | Status: AC
  Administered 2016-01-02: 45 mg via INTRAVENOUS
  Filled 2016-01-02: qty 9

## 2016-01-02 MED ORDER — SODIUM CHLORIDE 0.9% FLUSH
10.0000 mL | INTRAVENOUS | Status: DC | PRN
  Administered 2016-01-02: 10 mL
  Filled 2016-01-02: qty 10

## 2016-01-02 MED ORDER — HEPARIN SOD (PORK) LOCK FLUSH 100 UNIT/ML IV SOLN
500.0000 [IU] | Freq: Once | INTRAVENOUS | Status: AC | PRN
  Administered 2016-01-02: 500 [IU]
  Filled 2016-01-02: qty 5

## 2016-01-02 NOTE — Progress Notes (Signed)
Ok to treat with 01/02/16 cbc and cmp.

## 2016-01-02 NOTE — Patient Instructions (Signed)
[facility] Discharge Instructions for Patients Receiving Chemotherapy  Today you received the following chemotherapy agents Dacogen.  To help prevent nausea and vomiting after your treatment, we encourage you to take your nausea medication.   If you develop nausea and vomiting that is not controlled by your nausea medication, call the clinic.   BELOW ARE SYMPTOMS THAT SHOULD BE REPORTED IMMEDIATELY:  *FEVER GREATER THAN 100.5 F  *CHILLS WITH OR WITHOUT FEVER  NAUSEA AND VOMITING THAT IS NOT CONTROLLED WITH YOUR NAUSEA MEDICATION  *UNUSUAL SHORTNESS OF BREATH  *UNUSUAL BRUISING OR BLEEDING  TENDERNESS IN MOUTH AND THROAT WITH OR WITHOUT PRESENCE OF ULCERS  *URINARY PROBLEMS  *BOWEL PROBLEMS  UNUSUAL RASH Items with * indicate a potential emergency and should be followed up as soon as possible.  Feel free to call the clinic you have any questions or concerns. The clinic phone number is [phone].  Please show the CHEMO ALERT CARD at check-in to the Emergency Department and triage nurse.  

## 2016-01-03 ENCOUNTER — Ambulatory Visit (HOSPITAL_BASED_OUTPATIENT_CLINIC_OR_DEPARTMENT_OTHER): Payer: Medicare Other

## 2016-01-03 VITALS — BP 125/59 | HR 108 | Temp 97.5°F | Resp 20

## 2016-01-03 DIAGNOSIS — D4621 Refractory anemia with excess of blasts 1: Secondary | ICD-10-CM

## 2016-01-03 DIAGNOSIS — D4620 Refractory anemia with excess of blasts, unspecified: Secondary | ICD-10-CM

## 2016-01-03 DIAGNOSIS — Z5111 Encounter for antineoplastic chemotherapy: Secondary | ICD-10-CM | POA: Diagnosis not present

## 2016-01-03 MED ORDER — SODIUM CHLORIDE 0.9 % IV SOLN
20.0000 mg/m2 | Freq: Once | INTRAVENOUS | Status: AC
  Administered 2016-01-03: 45 mg via INTRAVENOUS
  Filled 2016-01-03: qty 9

## 2016-01-03 MED ORDER — HEPARIN SOD (PORK) LOCK FLUSH 100 UNIT/ML IV SOLN
500.0000 [IU] | Freq: Once | INTRAVENOUS | Status: AC | PRN
  Administered 2016-01-03: 500 [IU]
  Filled 2016-01-03: qty 5

## 2016-01-03 MED ORDER — PROCHLORPERAZINE MALEATE 10 MG PO TABS
10.0000 mg | ORAL_TABLET | Freq: Once | ORAL | Status: AC
  Administered 2016-01-03: 10 mg via ORAL

## 2016-01-03 MED ORDER — SODIUM CHLORIDE 0.9% FLUSH
10.0000 mL | INTRAVENOUS | Status: DC | PRN
  Administered 2016-01-03: 10 mL
  Filled 2016-01-03: qty 10

## 2016-01-03 MED ORDER — PROCHLORPERAZINE MALEATE 10 MG PO TABS
ORAL_TABLET | ORAL | Status: AC
  Filled 2016-01-03: qty 1

## 2016-01-03 MED ORDER — DARBEPOETIN ALFA 500 MCG/ML IJ SOSY
PREFILLED_SYRINGE | INTRAMUSCULAR | Status: AC
  Filled 2016-01-03: qty 1

## 2016-01-03 MED ORDER — DARBEPOETIN ALFA 500 MCG/ML IJ SOSY
400.0000 ug | PREFILLED_SYRINGE | Freq: Once | INTRAMUSCULAR | Status: AC
Start: 2016-01-03 — End: 2016-01-03
  Administered 2016-01-03: 400 ug via SUBCUTANEOUS

## 2016-01-03 MED ORDER — SODIUM CHLORIDE 0.9 % IV SOLN
Freq: Once | INTRAVENOUS | Status: AC
  Administered 2016-01-03: 10:00:00 via INTRAVENOUS

## 2016-01-03 NOTE — Patient Instructions (Signed)
Darbepoetin Alfa injection What is this medicine? DARBEPOETIN ALFA (dar be POE e tin AL fa) helps your body make more red blood cells. It is used to treat anemia caused by chronic kidney failure and chemotherapy. This medicine may be used for other purposes; ask your health care provider or pharmacist if you have questions. What should I tell my health care provider before I take this medicine? They need to know if you have any of these conditions: -blood clotting disorders or history of blood clots -cancer patient not on chemotherapy -cystic fibrosis -heart disease, such as angina, heart failure, or a history of a heart attack -hemoglobin level of 12 g/dL or greater -high blood pressure -low levels of folate, iron, or vitamin B12 -seizures -an unusual or allergic reaction to darbepoetin, erythropoietin, albumin, hamster proteins, latex, other medicines, foods, dyes, or preservatives -pregnant or trying to get pregnant -breast-feeding How should I use this medicine? This medicine is for injection into a vein or under the skin. It is usually given by a health care professional in a hospital or clinic setting. If you get this medicine at home, you will be taught how to prepare and give this medicine. Do not shake the solution before you withdraw a dose. Use exactly as directed. Take your medicine at regular intervals. Do not take your medicine more often than directed. It is important that you put your used needles and syringes in a special sharps container. Do not put them in a trash can. If you do not have a sharps container, call your pharmacist or healthcare provider to get one. Talk to your pediatrician regarding the use of this medicine in children. While this medicine may be used in children as young as 1 year for selected conditions, precautions do apply. Overdosage: If you think you have taken too much of this medicine contact a poison control center or emergency room at once. NOTE:  This medicine is only for you. Do not share this medicine with others. What if I miss a dose? If you miss a dose, take it as soon as you can. If it is almost time for your next dose, take only that dose. Do not take double or extra doses. What may interact with this medicine? Do not take this medicine with any of the following medications: -epoetin alfa This list may not describe all possible interactions. Give your health care provider a list of all the medicines, herbs, non-prescription drugs, or dietary supplements you use. Also tell them if you smoke, drink alcohol, or use illegal drugs. Some items may interact with your medicine. What should I watch for while using this medicine? Visit your prescriber or health care professional for regular checks on your progress and for the needed blood tests and blood pressure measurements. It is especially important for the doctor to make sure your hemoglobin level is in the desired range, to limit the risk of potential side effects and to give you the best benefit. Keep all appointments for any recommended tests. Check your blood pressure as directed. Ask your doctor what your blood pressure should be and when you should contact him or her. As your body makes more red blood cells, you may need to take iron, folic acid, or vitamin B supplements. Ask your doctor or health care provider which products are right for you. If you have kidney disease continue dietary restrictions, even though this medication can make you feel better. Talk with your doctor or health care professional about the   foods you eat and the vitamins that you take. What side effects may I notice from receiving this medicine? Side effects that you should report to your doctor or health care professional as soon as possible: -allergic reactions like skin rash, itching or hives, swelling of the face, lips, or tongue -breathing problems -changes in vision -chest pain -confusion, trouble speaking  or understanding -feeling faint or lightheaded, falls -high blood pressure -muscle aches or pains -pain, swelling, warmth in the leg -rapid weight gain -severe headaches -sudden numbness or weakness of the face, arm or leg -trouble walking, dizziness, loss of balance or coordination -seizures (convulsions) -swelling of the ankles, feet, hands -unusually weak or tired Side effects that usually do not require medical attention (report to your doctor or health care professional if they continue or are bothersome): -diarrhea -fever, chills (flu-like symptoms) -headaches -nausea, vomiting -redness, stinging, or swelling at site where injected This list may not describe all possible side effects. Call your doctor for medical advice about side effects. You may report side effects to FDA at 1-800-FDA-1088. Where should I keep my medicine? Keep out of the reach of children. Store in a refrigerator between 2 and 8 degrees C (36 and 46 degrees F). Do not freeze. Do not shake. Throw away any unused portion if using a single-dose vial. Throw away any unused medicine after the expiration date. NOTE: This sheet is a summary. It may not cover all possible information. If you have questions about this medicine, talk to your doctor, pharmacist, or health care provider.    2016, [employer] Standard. (2008-05-11 10:23:57) Decitabine injection for infusion What is this medicine? DECITABINE (dee SYE ta been) is a chemotherapy drug. This medicine reduces the growth of cancer cells. It is used to treat adults with myelodysplastic syndromes. This medicine may be used for other purposes; ask your health care provider or pharmacist if you have questions. What should I tell my health care provider before I take this medicine? They need to know if you have any of these conditions: -infection (especially a virus infection such as chickenpox, cold sores, or herpes) -kidney disease -liver disease -an unusual  or allergic reaction to decitabine, other medicines, foods, dyes, or preservatives -pregnant or trying to get pregnant -breast-feeding How should I use this medicine? This medicine is for infusion into a vein. It is administered in a hospital or clinic by a doctor or health care professional. Talk to your pediatrician regarding the use of this medicine in children. Special care may be needed. Overdosage: If you think you have taken too much of this medicine contact a poison control center or emergency room at once. NOTE: This medicine is only for you. Do not share this medicine with others. What if I miss a dose? It is important not to miss your dose. Call your doctor or health care professional if you are unable to keep an appointment. What may interact with this medicine? -vaccines Talk to your doctor or health care professional before taking any of these medicines: -aspirin -acetaminophen -ibuprofen -ketoprofen -naproxen This list may not describe all possible interactions. Give your health care provider a list of all the medicines, herbs, non-prescription drugs, or dietary supplements you use. Also tell them if you smoke, drink alcohol, or use illegal drugs. Some items may interact with your medicine. What should I watch for while using this medicine? Visit your doctor for checks on your progress. This drug may make you feel generally unwell. This is not uncommon, as  chemotherapy can affect healthy cells as well as cancer cells. Report any side effects. Continue your course of treatment even though you feel ill unless your doctor tells you to stop. In some cases, you may be given additional medicines to help with side effects. Follow all directions for their use. Call your doctor or health care professional for advice if you get a fever, chills or sore throat, or other symptoms of a cold or flu. Do not treat yourself. This drug decreases your body's ability to fight infections. Try to avoid  being around people who are sick. This medicine may increase your risk to bruise or bleed. Call your doctor or health care professional if you notice any unusual bleeding. Do not become pregnant while taking this medicine or for at least 1 month after stopping it. Women should inform their doctor if they wish to become pregnant or think they might be pregnant. Men should not father a child while taking this medicine and for at least 2 months after stopping it. There is a potential for serious side effects to an unborn child. Talk to your health care professional or pharmacist for more information. Do not breast-feed an infant while taking this medicine. What side effects may I notice from receiving this medicine? Side effects that you should report to your doctor or health care professional as soon as possible: -low blood counts - this medicine may decrease the number of white blood cells, red blood cells and platelets. You may be at increased risk for infections and bleeding. -signs of infection - fever or chills, cough, sore throat, pain or difficulty passing urine -signs of decreased platelets or bleeding - bruising, pinpoint red spots on the skin, black, tarry stools, blood in the urine -signs of decreased red blood cells - unusual weakness or tiredness, fainting spells, lightheadedness -increased blood sugar Side effects that usually do not require medical attention (report to your prescriber or health care professional if they continue or are bothersome): -constipation -diarrhea -headache -loss of appetite -nausea, vomiting -skin rash, itching -stomach pain -water retention -weak or tired This list may not describe all possible side effects. Call your doctor for medical advice about side effects. You may report side effects to FDA at 1-800-FDA-1088. Where should I keep my medicine? This drug is given in a hospital or clinic and will not be stored at home. NOTE: This sheet is a summary.  It may not cover all possible information. If you have questions about this medicine, talk to your doctor, pharmacist, or health care provider.    2016, [employer] Standard. (2014-12-28 12:56:02)

## 2016-01-09 ENCOUNTER — Other Ambulatory Visit: Payer: Self-pay | Admitting: Family

## 2016-01-09 ENCOUNTER — Ambulatory Visit (HOSPITAL_BASED_OUTPATIENT_CLINIC_OR_DEPARTMENT_OTHER): Payer: Medicare Other

## 2016-01-09 ENCOUNTER — Other Ambulatory Visit (HOSPITAL_BASED_OUTPATIENT_CLINIC_OR_DEPARTMENT_OTHER): Payer: Medicare Other

## 2016-01-09 DIAGNOSIS — D4620 Refractory anemia with excess of blasts, unspecified: Secondary | ICD-10-CM

## 2016-01-09 DIAGNOSIS — D509 Iron deficiency anemia, unspecified: Secondary | ICD-10-CM

## 2016-01-09 DIAGNOSIS — D46Z Other myelodysplastic syndromes: Secondary | ICD-10-CM

## 2016-01-09 DIAGNOSIS — D4621 Refractory anemia with excess of blasts 1: Secondary | ICD-10-CM | POA: Diagnosis not present

## 2016-01-09 DIAGNOSIS — Z452 Encounter for adjustment and management of vascular access device: Secondary | ICD-10-CM

## 2016-01-09 LAB — CMP (CANCER CENTER ONLY)
ALBUMIN: 3 g/dL — AB (ref 3.3–5.5)
ALK PHOS: 47 U/L (ref 26–84)
ALT: 25 U/L (ref 10–47)
AST: 22 U/L (ref 11–38)
BILIRUBIN TOTAL: 0.7 mg/dL (ref 0.20–1.60)
BUN, Bld: 22 mg/dL (ref 7–22)
CALCIUM: 8.7 mg/dL (ref 8.0–10.3)
CO2: 26 mEq/L (ref 18–33)
Chloride: 103 mEq/L (ref 98–108)
Creat: 1.3 mg/dl — ABNORMAL HIGH (ref 0.6–1.2)
Glucose, Bld: 133 mg/dL — ABNORMAL HIGH (ref 73–118)
POTASSIUM: 3.9 meq/L (ref 3.3–4.7)
Sodium: 135 mEq/L (ref 128–145)
Total Protein: 6.9 g/dL (ref 6.4–8.1)

## 2016-01-09 LAB — CBC WITH DIFFERENTIAL (CANCER CENTER ONLY)
BASO#: 0 10*3/uL (ref 0.0–0.2)
BASO%: 1.2 % (ref 0.0–2.0)
EOS ABS: 0 10*3/uL (ref 0.0–0.5)
EOS%: 2.5 % (ref 0.0–7.0)
HCT: 22.3 % — ABNORMAL LOW (ref 38.7–49.9)
HGB: 7.5 g/dL — ABNORMAL LOW (ref 13.0–17.1)
LYMPH#: 0.2 10*3/uL — ABNORMAL LOW (ref 0.9–3.3)
LYMPH%: 29.6 % (ref 14.0–48.0)
MCH: 35.5 pg — AB (ref 28.0–33.4)
MCHC: 33.6 g/dL (ref 32.0–35.9)
MCV: 106 fL — AB (ref 82–98)
MONO#: 0.1 10*3/uL (ref 0.1–0.9)
MONO%: 9.9 % (ref 0.0–13.0)
NEUT#: 0.5 10*3/uL — CL (ref 1.5–6.5)
NEUT%: 56.8 % (ref 40.0–80.0)
PLATELETS: 119 10*3/uL — AB (ref 145–400)
RBC: 2.11 10*6/uL — ABNORMAL LOW (ref 4.20–5.70)
RDW: 20.6 % — AB (ref 11.1–15.7)
WBC: 0.8 10*3/uL — AB (ref 4.0–10.0)

## 2016-01-09 LAB — IRON AND TIBC
%SAT: 85 % — ABNORMAL HIGH (ref 20–55)
Iron: 193 ug/dL — ABNORMAL HIGH (ref 42–163)
TIBC: 228 ug/dL (ref 202–409)
UIBC: 34 ug/dL — ABNORMAL LOW (ref 117–376)

## 2016-01-09 LAB — LACTATE DEHYDROGENASE: LDH: 214 U/L (ref 125–245)

## 2016-01-09 LAB — FERRITIN

## 2016-01-09 MED ORDER — HEPARIN SOD (PORK) LOCK FLUSH 100 UNIT/ML IV SOLN
500.0000 [IU] | Freq: Once | INTRAVENOUS | Status: AC
  Administered 2016-01-09: 500 [IU] via INTRAVENOUS
  Filled 2016-01-09: qty 5

## 2016-01-09 MED ORDER — HEPARIN SOD (PORK) LOCK FLUSH 100 UNIT/ML IV SOLN
500.0000 [IU] | Freq: Once | INTRAVENOUS | Status: DC
  Filled 2016-01-09: qty 5

## 2016-01-09 MED ORDER — SODIUM CHLORIDE 0.9% FLUSH
10.0000 mL | INTRAVENOUS | Status: DC | PRN
  Administered 2016-01-09: 10 mL via INTRAVENOUS
  Filled 2016-01-09: qty 10

## 2016-01-09 MED ORDER — SODIUM CHLORIDE 0.9% FLUSH
10.0000 mL | INTRAVENOUS | Status: DC | PRN
  Filled 2016-01-09: qty 10

## 2016-01-09 MED ORDER — LORAZEPAM 0.5 MG PO TABS: 0.5000 mg | ORAL_TABLET | Freq: Four times a day (QID) | ORAL | 0 refills | Status: DC | PRN

## 2016-01-09 MED FILL — LORazepam 0.5 MG TABS: 0.5 | 8 days supply | Qty: 30 | Fill #0

## 2016-01-09 NOTE — Patient Instructions (Signed)

## 2016-01-10 ENCOUNTER — Ambulatory Visit (HOSPITAL_BASED_OUTPATIENT_CLINIC_OR_DEPARTMENT_OTHER): Payer: Medicare Other

## 2016-01-10 DIAGNOSIS — D4621 Refractory anemia with excess of blasts 1: Secondary | ICD-10-CM

## 2016-01-10 DIAGNOSIS — D509 Iron deficiency anemia, unspecified: Secondary | ICD-10-CM

## 2016-01-10 DIAGNOSIS — D638 Anemia in other chronic diseases classified elsewhere: Secondary | ICD-10-CM

## 2016-01-10 DIAGNOSIS — D4620 Refractory anemia with excess of blasts, unspecified: Secondary | ICD-10-CM

## 2016-01-10 LAB — PREPARE RBC (CROSSMATCH)

## 2016-01-10 MED ORDER — FUROSEMIDE 10 MG/ML IJ SOLN
20.0000 mg | Freq: Once | INTRAMUSCULAR | Status: AC
  Administered 2016-01-10: 20 mg via INTRAVENOUS

## 2016-01-10 MED ORDER — HYDROMORPHONE HCL 1 MG/ML IJ SOLN
INTRAMUSCULAR | Status: AC
  Filled 2016-01-10: qty 2

## 2016-01-10 MED ORDER — HEPARIN SOD (PORK) LOCK FLUSH 100 UNIT/ML IV SOLN
500.0000 [IU] | Freq: Every day | INTRAVENOUS | Status: AC | PRN
  Administered 2016-01-10: 500 [IU]
  Filled 2016-01-10: qty 5

## 2016-01-10 MED ORDER — ACETAMINOPHEN 325 MG PO TABS
ORAL_TABLET | ORAL | Status: AC
  Filled 2016-01-10: qty 2

## 2016-01-10 MED ORDER — DIPHENHYDRAMINE HCL 25 MG PO CAPS
ORAL_CAPSULE | ORAL | Status: AC
  Filled 2016-01-10: qty 1

## 2016-01-10 MED ORDER — ACETAMINOPHEN 325 MG PO TABS
650.0000 mg | ORAL_TABLET | Freq: Once | ORAL | Status: AC
  Administered 2016-01-10: 650 mg via ORAL

## 2016-01-10 MED ORDER — SODIUM CHLORIDE 0.9% FLUSH
10.0000 mL | INTRAVENOUS | Status: AC | PRN
  Administered 2016-01-10: 10 mL
  Filled 2016-01-10: qty 10

## 2016-01-10 MED ORDER — FUROSEMIDE 10 MG/ML IJ SOLN
INTRAMUSCULAR | Status: AC
  Filled 2016-01-10: qty 4

## 2016-01-10 MED ORDER — SODIUM CHLORIDE 0.9 % IV SOLN
250.0000 mL | Freq: Once | INTRAVENOUS | Status: AC
  Administered 2016-01-10: 250 mL via INTRAVENOUS

## 2016-01-10 MED ORDER — DIPHENHYDRAMINE HCL 25 MG PO CAPS
25.0000 mg | ORAL_CAPSULE | Freq: Once | ORAL | Status: AC
  Administered 2016-01-10: 25 mg via ORAL

## 2016-01-10 NOTE — Patient Instructions (Signed)

## 2016-01-11 LAB — TYPE AND SCREEN
ABO/RH(D): O POS
ANTIBODY SCREEN: NEGATIVE
UNIT DIVISION: 0
Unit division: 0
Unit division: 0

## 2016-01-13 ENCOUNTER — Other Ambulatory Visit: Payer: Self-pay

## 2016-01-13 DIAGNOSIS — D4620 Refractory anemia with excess of blasts, unspecified: Secondary | ICD-10-CM

## 2016-01-16 ENCOUNTER — Other Ambulatory Visit (HOSPITAL_BASED_OUTPATIENT_CLINIC_OR_DEPARTMENT_OTHER): Payer: Medicare Other

## 2016-01-16 ENCOUNTER — Ambulatory Visit (HOSPITAL_BASED_OUTPATIENT_CLINIC_OR_DEPARTMENT_OTHER): Payer: Medicare Other

## 2016-01-16 VITALS — BP 114/62 | HR 101 | Temp 97.6°F | Resp 20

## 2016-01-16 DIAGNOSIS — Z452 Encounter for adjustment and management of vascular access device: Secondary | ICD-10-CM | POA: Diagnosis not present

## 2016-01-16 DIAGNOSIS — D4621 Refractory anemia with excess of blasts 1: Secondary | ICD-10-CM | POA: Diagnosis not present

## 2016-01-16 DIAGNOSIS — D4620 Refractory anemia with excess of blasts, unspecified: Secondary | ICD-10-CM

## 2016-01-16 LAB — CBC WITH DIFFERENTIAL (CANCER CENTER ONLY)
BASO#: 0 10*3/uL (ref 0.0–0.2)
BASO%: 2.8 % — AB (ref 0.0–2.0)
EOS%: 1.4 % (ref 0.0–7.0)
Eosinophils Absolute: 0 10*3/uL (ref 0.0–0.5)
HCT: 31.3 % — ABNORMAL LOW (ref 38.7–49.9)
HGB: 10.5 g/dL — ABNORMAL LOW (ref 13.0–17.1)
LYMPH#: 0.5 10*3/uL — ABNORMAL LOW (ref 0.9–3.3)
LYMPH%: 72.2 % — AB (ref 14.0–48.0)
MCH: 33.4 pg (ref 28.0–33.4)
MCHC: 33.5 g/dL (ref 32.0–35.9)
MCV: 100 fL — AB (ref 82–98)
MONO#: 0 10*3/uL — AB (ref 0.1–0.9)
MONO%: 5.6 % (ref 0.0–13.0)
NEUT#: 0.1 10*3/uL — CL (ref 1.5–6.5)
NEUT%: 18 % — AB (ref 40.0–80.0)
PLATELETS: 109 10*3/uL — AB (ref 145–400)
RBC: 3.14 10*6/uL — ABNORMAL LOW (ref 4.20–5.70)
RDW: 21.4 % — AB (ref 11.1–15.7)
WBC: 0.7 10*3/uL — CL (ref 4.0–10.0)

## 2016-01-16 LAB — CMP (CANCER CENTER ONLY)
ALT: 20 U/L (ref 10–47)
AST: 20 U/L (ref 11–38)
Albumin: 3 g/dL — ABNORMAL LOW (ref 3.3–5.5)
Alkaline Phosphatase: 53 U/L (ref 26–84)
BUN: 19 mg/dL (ref 7–22)
CHLORIDE: 104 meq/L (ref 98–108)
CO2: 27 meq/L (ref 18–33)
Calcium: 8.9 mg/dL (ref 8.0–10.3)
Creat: 1.3 mg/dl — ABNORMAL HIGH (ref 0.6–1.2)
GLUCOSE: 100 mg/dL (ref 73–118)
POTASSIUM: 3.8 meq/L (ref 3.3–4.7)
Sodium: 136 mEq/L (ref 128–145)
Total Bilirubin: 0.7 mg/dl (ref 0.20–1.60)
Total Protein: 7 g/dL (ref 6.4–8.1)

## 2016-01-16 LAB — TECHNOLOGIST REVIEW CHCC SATELLITE

## 2016-01-16 MED ORDER — HEPARIN SOD (PORK) LOCK FLUSH 100 UNIT/ML IV SOLN
500.0000 [IU] | Freq: Once | INTRAVENOUS | Status: AC
  Administered 2016-01-16: 500 [IU] via INTRAVENOUS
  Filled 2016-01-16: qty 5

## 2016-01-16 MED ORDER — SODIUM CHLORIDE 0.9% FLUSH
10.0000 mL | INTRAVENOUS | Status: DC | PRN
  Administered 2016-01-16: 10 mL via INTRAVENOUS
  Filled 2016-01-16: qty 10

## 2016-01-16 NOTE — Patient Instructions (Signed)

## 2016-01-17 ENCOUNTER — Encounter: Payer: Self-pay | Admitting: Hematology & Oncology

## 2016-01-20 ENCOUNTER — Other Ambulatory Visit: Payer: Self-pay | Admitting: Nurse Practitioner

## 2016-01-20 DIAGNOSIS — D4620 Refractory anemia with excess of blasts, unspecified: Secondary | ICD-10-CM

## 2016-01-23 ENCOUNTER — Ambulatory Visit (HOSPITAL_BASED_OUTPATIENT_CLINIC_OR_DEPARTMENT_OTHER): Payer: Medicare Other

## 2016-01-23 ENCOUNTER — Other Ambulatory Visit (HOSPITAL_BASED_OUTPATIENT_CLINIC_OR_DEPARTMENT_OTHER): Payer: Medicare Other

## 2016-01-23 ENCOUNTER — Encounter: Payer: Self-pay | Admitting: Hematology & Oncology

## 2016-01-23 ENCOUNTER — Ambulatory Visit (HOSPITAL_BASED_OUTPATIENT_CLINIC_OR_DEPARTMENT_OTHER): Payer: Medicare Other | Admitting: Hematology & Oncology

## 2016-01-23 ENCOUNTER — Ambulatory Visit: Payer: Medicare Other

## 2016-01-23 DIAGNOSIS — D4620 Refractory anemia with excess of blasts, unspecified: Secondary | ICD-10-CM

## 2016-01-23 DIAGNOSIS — D4622 Refractory anemia with excess of blasts 2: Secondary | ICD-10-CM

## 2016-01-23 DIAGNOSIS — Z5111 Encounter for antineoplastic chemotherapy: Secondary | ICD-10-CM | POA: Diagnosis not present

## 2016-01-23 LAB — CBC WITH DIFFERENTIAL (CANCER CENTER ONLY)
BASO#: 0 10*3/uL (ref 0.0–0.2)
BASO%: 3.3 % — ABNORMAL HIGH (ref 0.0–2.0)
EOS ABS: 0 10*3/uL (ref 0.0–0.5)
EOS%: 3.3 % (ref 0.0–7.0)
HEMATOCRIT: 30.3 % — AB (ref 38.7–49.9)
HEMOGLOBIN: 10.1 g/dL — AB (ref 13.0–17.1)
LYMPH#: 0.3 10*3/uL — ABNORMAL LOW (ref 0.9–3.3)
LYMPH%: 56.7 % — ABNORMAL HIGH (ref 14.0–48.0)
MCH: 33.7 pg — AB (ref 28.0–33.4)
MCHC: 33.3 g/dL (ref 32.0–35.9)
MCV: 101 fL — ABNORMAL HIGH (ref 82–98)
MONO#: 0.1 10*3/uL (ref 0.1–0.9)
MONO%: 10 % (ref 0.0–13.0)
NEUT%: 26.7 % — AB (ref 40.0–80.0)
NEUTROS ABS: 0.2 10*3/uL — AB (ref 1.5–6.5)
Platelets: 579 10*3/uL — ABNORMAL HIGH (ref 145–400)
RBC: 3 10*6/uL — ABNORMAL LOW (ref 4.20–5.70)
RDW: 22.3 % — AB (ref 11.1–15.7)
WBC: 0.6 10*3/uL — CL (ref 4.0–10.0)

## 2016-01-23 LAB — CMP (CANCER CENTER ONLY)
ALT(SGPT): 16 U/L (ref 10–47)
AST: 23 U/L (ref 11–38)
Albumin: 3 g/dL — ABNORMAL LOW (ref 3.3–5.5)
Alkaline Phosphatase: 52 U/L (ref 26–84)
BUN: 17 mg/dL (ref 7–22)
CHLORIDE: 103 meq/L (ref 98–108)
CO2: 26 meq/L (ref 18–33)
CREATININE: 1.3 mg/dL — AB (ref 0.6–1.2)
Calcium: 8.8 mg/dL (ref 8.0–10.3)
GLUCOSE: 113 mg/dL (ref 73–118)
POTASSIUM: 3.9 meq/L (ref 3.3–4.7)
SODIUM: 135 meq/L (ref 128–145)
Total Bilirubin: 0.6 mg/dl (ref 0.20–1.60)
Total Protein: 6.9 g/dL (ref 6.4–8.1)

## 2016-01-23 MED ORDER — PROCHLORPERAZINE MALEATE 10 MG PO TABS
10.0000 mg | ORAL_TABLET | Freq: Once | ORAL | Status: AC
  Administered 2016-01-23: 10 mg via ORAL

## 2016-01-23 MED ORDER — PROCHLORPERAZINE MALEATE 10 MG PO TABS
ORAL_TABLET | ORAL | Status: AC
  Filled 2016-01-23: qty 1

## 2016-01-23 MED ORDER — DECITABINE CHEMO INJECTION 50 MG
20.0000 mg/m2 | Freq: Once | INTRAVENOUS | Status: AC
  Administered 2016-01-23: 45 mg via INTRAVENOUS
  Filled 2016-01-23: qty 9

## 2016-01-23 MED ORDER — SODIUM CHLORIDE 0.9 % IV SOLN
Freq: Once | INTRAVENOUS | Status: AC
  Administered 2016-01-23: 10:00:00 via INTRAVENOUS

## 2016-01-23 MED ORDER — SODIUM CHLORIDE 0.9% FLUSH
10.0000 mL | INTRAVENOUS | Status: DC | PRN
  Administered 2016-01-23: 10 mL
  Filled 2016-01-23: qty 10

## 2016-01-23 MED ORDER — DEFERASIROX 360 MG PO TABS: 3.0000 | ORAL_TABLET | Freq: Every day | ORAL | 3 refills | Status: DC

## 2016-01-23 MED ORDER — HEPARIN SOD (PORK) LOCK FLUSH 100 UNIT/ML IV SOLN
500.0000 [IU] | Freq: Once | INTRAVENOUS | Status: AC | PRN
  Administered 2016-01-23: 500 [IU]
  Filled 2016-01-23: qty 5

## 2016-01-23 NOTE — Progress Notes (Signed)
Hematology and Oncology Follow Up Visit  DREVIN ORTNER [id number] [birth year] 75 y.o. 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 Shostak is here today with his wife for a follow-up. He is holding his own for right now. He has have some issues with balance. He does use a walking cane.  Overall, he is tolerated decitabine pretty well. He has not been transfused as often. I think his last transfusion was back in late July.  He's had no nausea or vomiting. He's had no rashes. He's had no diarrhea. He's had no cough or shortness of breath.  His appetite has been doing quite good.  We are watching his iron studies. His last ferritin was 3233. His iron saturation was 85%.  At some point, we will have to possibly think about iron chelating therapy for him.   Overall, his performance status is ECOG 1.    Medications:    Medication List       Accurate as of 01/23/16  9:37 AM. Always use your most recent med list.          bimatoprost 0.01 % Soln Commonly known as:  LUMIGAN Place 1 drop into both eyes at bedtime.   budesonide-formoterol 160-4.5 MCG/ACT inhaler Commonly known as:  SYMBICORT Inhale 2 puffs into the lungs 2 (two) times daily.   buPROPion 150 MG 24 hr tablet Commonly known as:  WELLBUTRIN XL Take 150 mg by mouth daily.   clopidogrel 75 MG tablet Commonly known as:  PLAVIX Take 75 mg by mouth daily.   co-enzyme Q-10 30 MG capsule Take 100 mg by mouth daily.   Cyanocobalamin 2500 MCG Tabs Take 5,000 mcg by mouth daily.   fish oil-omega-3 fatty acids 1000 MG capsule Take 1 g by mouth daily.   Flax Seed Oil 1000 MG Caps Take 1,000 mg by mouth daily.   LORazepam 0.5 MG tablet Commonly known as:  ATIVAN Take 1 tablet  (0.5 mg total) by mouth every 6 (six) hours as needed (Nausea or vomiting).   pantoprazole 40 MG tablet Commonly known as:  PROTONIX Take 40 mg by mouth daily.   predniSONE 20 MG tablet Commonly known as:  DELTASONE Take 1 tablet (20 mg total) by mouth daily with breakfast.   PROAIR RESPICLICK 423 (90 Base) MCG/ACT Aepb Generic drug:  Albuterol Sulfate Inhale 2 puffs into the lungs every 6 (six) hours as needed.   prochlorperazine 10 MG tablet Commonly known as:  COMPAZINE Take 1 tablet (10 mg total) by mouth every 6 (six) hours as needed (Nausea or vomiting).   ranitidine 300 MG tablet Commonly known as:  ZANTAC Take 300 mg by mouth at bedtime.   sertraline 100 MG tablet Commonly known as:  ZOLOFT Take 100 mg by mouth daily.   SIMBRINZA 1-0.2 % Susp Generic drug:  Brinzolamide-Brimonidine   simvastatin 40 MG tablet Commonly known as:  ZOCOR Take 40 mg by mouth every other day.   tiotropium 18 MCG inhalation capsule Commonly known as:  SPIRIVA Place 18 mcg into inhaler and inhale daily.   Vitamin D3 5000 units Tabs Take by mouth daily.       Allergies: No Known Allergies  Past Medical History, Surgical history, Social  history, and Family History were reviewed and updated.  Review of Systems: All other 10 point review of systems is negative.   Physical Exam:  weight is 207 lb (93.9 kg). His oral temperature is 97.5 F (36.4 C). His blood pressure is 133/57 (abnormal) and his pulse is 92. His respiration is 16.   Wt Readings from Last 3 Encounters:  01/23/16 207 lb (93.9 kg)  12/26/15 206 lb (93.4 kg)  11/21/15 205 lb (93 kg)    Well-developed and well-nourished white male in no obvious distress. Head and neck exam shows no ocular or oral lesions. He has no palpable cervical or supraclavicular lymph nodes. Lungs are clear. Cardiac exam regular rate and rhythm with no murmurs, rubs or bruits. Abdomen is soft. He has good bowel sounds. There is no fluid wave.  There is no palpable liver or spleen tip. Back exam shows no tenderness over the spine, ribs or hips. Extremities shows no clubbing, cyanosis or edema. Skin exam shows no rashes, ecchymoses or petechia. Neurological exam shows no focal neurological deficits. oriented, appropriate affect  Lab Results  Component Value Date   WBC 0.6 (LL) 01/23/2016   HGB 10.1 (L) 01/23/2016   HCT 30.3 (L) 01/23/2016   MCV 101 (H) 01/23/2016   PLT 579 Large platelets present (H) 01/23/2016   Lab Results  Component Value Date   FERRITIN 3,233 (H) 01/09/2016   IRON 193 (H) 01/09/2016   TIBC 228 01/09/2016   UIBC 34 (L) 01/09/2016   IRONPCTSAT 85 (H) 01/09/2016   Lab Results  Component Value Date   RETICCTPCT 1.7 05/30/2015   RBC 3.00 (L) 01/23/2016   RETICCTABS 39.4 05/30/2015   No results found for: KPAFRELGTCHN, LAMBDASER, KAPLAMBRATIO No results found for: Kandis Cocking [facility] Lab Results  Component Value Date   TOTALPROTELP 7.3 01/01/2014   ALBUMINELP 55.1 (L) 01/01/2014   A1GS 6.9 (H) 01/01/2014   A2GS 8.9 01/01/2014   BETS 7.9 (H) 01/01/2014   BETA2SER 6.7 (H) 01/01/2014   GAMS 14.5 01/01/2014   MSPIKE NOT DET 01/01/2014   SPEI * 01/01/2014     Chemistry      Component Value Date/Time   NA 135 01/23/2016 0828   NA 140 11/08/2015 0948   K 3.9 01/23/2016 0828   K 3.9 11/08/2015 0948   CL 103 01/23/2016 0828   CO2 26 01/23/2016 0828   CO2 24 11/08/2015 0948   BUN 17 01/23/2016 0828   BUN 21.9 11/08/2015 0948   CREATININE 1.3 (H) 01/23/2016 0828   CREATININE 1.4 (H) 11/08/2015 0948      Component Value Date/Time   CALCIUM 8.8 01/23/2016 0828   CALCIUM 9.3 11/08/2015 0948   ALKPHOS 52 01/23/2016 0828   ALKPHOS 45 11/08/2015 0948   AST 23 01/23/2016 0828   AST 15 11/08/2015 0948   ALT 16 01/23/2016 0828   ALT 13 11/08/2015 0948   BILITOT 0.60 01/23/2016 0828   BILITOT 0.48 11/08/2015 0948     Impression and Plan: Mr. Obar is 75 year old gentleman with myelodysplasia  and refractory anemia.  His bone marrow biopsy in June showed refractory anemia with excess blasts - 2.   We will go ahead with this third cycle of treatment.  I do think that we probably are going to have to consider iron chelating therapy for him. I would go ahead and use JADENU. I think this would be fairly well tolerated. I'll have to calculate the dose.  He will continue to have his labs checked  weekly for right now.  I'll plan for another bone marrow test after his fourth cycle of treatment.   Volanda Napoleon, MD 8/14/20179:37 AM

## 2016-01-23 NOTE — Patient Instructions (Signed)
[facility] Discharge Instructions for Patients Receiving Chemotherapy  Today you received the following chemotherapy agents Dacogen.  To help prevent nausea and vomiting after your treatment, we encourage you to take your nausea medication.   If you develop nausea and vomiting that is not controlled by your nausea medication, call the clinic.   BELOW ARE SYMPTOMS THAT SHOULD BE REPORTED IMMEDIATELY:  *FEVER GREATER THAN 100.5 F  *CHILLS WITH OR WITHOUT FEVER  NAUSEA AND VOMITING THAT IS NOT CONTROLLED WITH YOUR NAUSEA MEDICATION  *UNUSUAL SHORTNESS OF BREATH  *UNUSUAL BRUISING OR BLEEDING  TENDERNESS IN MOUTH AND THROAT WITH OR WITHOUT PRESENCE OF ULCERS  *URINARY PROBLEMS  *BOWEL PROBLEMS  UNUSUAL RASH Items with * indicate a potential emergency and should be followed up as soon as possible.  Feel free to call the clinic you have any questions or concerns. The clinic phone number is [phone].  Please show the CHEMO ALERT CARD at check-in to the Emergency Department and triage nurse.  

## 2016-01-23 NOTE — Progress Notes (Signed)
OK to treat with ANC 0.2 per Dr. Marin Olp.

## 2016-01-23 NOTE — Patient Instructions (Signed)

## 2016-01-24 ENCOUNTER — Ambulatory Visit (HOSPITAL_BASED_OUTPATIENT_CLINIC_OR_DEPARTMENT_OTHER): Payer: Medicare Other

## 2016-01-24 VITALS — BP 107/62 | HR 90 | Temp 97.5°F | Resp 16

## 2016-01-24 DIAGNOSIS — Z5111 Encounter for antineoplastic chemotherapy: Secondary | ICD-10-CM | POA: Diagnosis not present

## 2016-01-24 DIAGNOSIS — D4622 Refractory anemia with excess of blasts 2: Secondary | ICD-10-CM

## 2016-01-24 DIAGNOSIS — D4620 Refractory anemia with excess of blasts, unspecified: Secondary | ICD-10-CM

## 2016-01-24 MED ORDER — PROCHLORPERAZINE MALEATE 10 MG PO TABS
10.0000 mg | ORAL_TABLET | Freq: Once | ORAL | Status: AC
  Administered 2016-01-24: 10 mg via ORAL

## 2016-01-24 MED ORDER — SODIUM CHLORIDE 0.9 % IV SOLN
Freq: Once | INTRAVENOUS | Status: AC
  Administered 2016-01-24: 09:00:00 via INTRAVENOUS

## 2016-01-24 MED ORDER — SODIUM CHLORIDE 0.9 % IV SOLN
20.0000 mg/m2 | Freq: Once | INTRAVENOUS | Status: AC
  Administered 2016-01-24: 45 mg via INTRAVENOUS
  Filled 2016-01-24: qty 9

## 2016-01-24 MED ORDER — PROCHLORPERAZINE MALEATE 10 MG PO TABS
ORAL_TABLET | ORAL | Status: AC
  Filled 2016-01-24: qty 1

## 2016-01-24 MED ORDER — SODIUM CHLORIDE 0.9% FLUSH
10.0000 mL | INTRAVENOUS | Status: DC | PRN
  Administered 2016-01-24: 10 mL
  Filled 2016-01-24: qty 10

## 2016-01-24 MED ORDER — HEPARIN SOD (PORK) LOCK FLUSH 100 UNIT/ML IV SOLN
500.0000 [IU] | Freq: Once | INTRAVENOUS | Status: AC | PRN
  Administered 2016-01-24: 500 [IU]
  Filled 2016-01-24: qty 5

## 2016-01-24 NOTE — Patient Instructions (Signed)
[facility] Discharge Instructions for Patients Receiving Chemotherapy  Today you received the following chemotherapy agents Dacogen.  To help prevent nausea and vomiting after your treatment, we encourage you to take your nausea medication.   If you develop nausea and vomiting that is not controlled by your nausea medication, call the clinic.   BELOW ARE SYMPTOMS THAT SHOULD BE REPORTED IMMEDIATELY:  *FEVER GREATER THAN 100.5 F  *CHILLS WITH OR WITHOUT FEVER  NAUSEA AND VOMITING THAT IS NOT CONTROLLED WITH YOUR NAUSEA MEDICATION  *UNUSUAL SHORTNESS OF BREATH  *UNUSUAL BRUISING OR BLEEDING  TENDERNESS IN MOUTH AND THROAT WITH OR WITHOUT PRESENCE OF ULCERS  *URINARY PROBLEMS  *BOWEL PROBLEMS  UNUSUAL RASH Items with * indicate a potential emergency and should be followed up as soon as possible.  Feel free to call the clinic you have any questions or concerns. The clinic phone number is [phone].  Please show the CHEMO ALERT CARD at check-in to the Emergency Department and triage nurse.  

## 2016-01-25 ENCOUNTER — Ambulatory Visit (HOSPITAL_BASED_OUTPATIENT_CLINIC_OR_DEPARTMENT_OTHER): Payer: Medicare Other

## 2016-01-25 VITALS — BP 107/58 | HR 107 | Temp 97.3°F | Resp 18

## 2016-01-25 DIAGNOSIS — D4622 Refractory anemia with excess of blasts 2: Secondary | ICD-10-CM | POA: Diagnosis not present

## 2016-01-25 DIAGNOSIS — Z5111 Encounter for antineoplastic chemotherapy: Secondary | ICD-10-CM

## 2016-01-25 DIAGNOSIS — D4620 Refractory anemia with excess of blasts, unspecified: Secondary | ICD-10-CM

## 2016-01-25 MED ORDER — PROCHLORPERAZINE MALEATE 10 MG PO TABS
ORAL_TABLET | ORAL | Status: AC
  Filled 2016-01-25: qty 1

## 2016-01-25 MED ORDER — SODIUM CHLORIDE 0.9 % IV SOLN
20.0000 mg/m2 | Freq: Once | INTRAVENOUS | Status: AC
  Administered 2016-01-25: 45 mg via INTRAVENOUS
  Filled 2016-01-25: qty 9

## 2016-01-25 MED ORDER — PROCHLORPERAZINE MALEATE 10 MG PO TABS
10.0000 mg | ORAL_TABLET | Freq: Once | ORAL | Status: AC
  Administered 2016-01-25: 10 mg via ORAL

## 2016-01-25 MED ORDER — SODIUM CHLORIDE 0.9% FLUSH
10.0000 mL | INTRAVENOUS | Status: DC | PRN
  Administered 2016-01-25: 10 mL
  Filled 2016-01-25: qty 10

## 2016-01-25 MED ORDER — HEPARIN SOD (PORK) LOCK FLUSH 100 UNIT/ML IV SOLN
500.0000 [IU] | Freq: Once | INTRAVENOUS | Status: AC | PRN
  Administered 2016-01-25: 500 [IU]
  Filled 2016-01-25: qty 5

## 2016-01-25 MED ORDER — SODIUM CHLORIDE 0.9 % IV SOLN
Freq: Once | INTRAVENOUS | Status: AC
  Administered 2016-01-25: 10:00:00 via INTRAVENOUS

## 2016-01-25 MED FILL — predniSONE 20 MG TABS: 20 | 30 days supply | Qty: 30 | Fill #3

## 2016-01-25 NOTE — Patient Instructions (Signed)
[facility] Discharge Instructions for Patients Receiving Chemotherapy  Today you received the following chemotherapy agents Dacogen.  To help prevent nausea and vomiting after your treatment, we encourage you to take your nausea medication.   If you develop nausea and vomiting that is not controlled by your nausea medication, call the clinic.   BELOW ARE SYMPTOMS THAT SHOULD BE REPORTED IMMEDIATELY:  *FEVER GREATER THAN 100.5 F  *CHILLS WITH OR WITHOUT FEVER  NAUSEA AND VOMITING THAT IS NOT CONTROLLED WITH YOUR NAUSEA MEDICATION  *UNUSUAL SHORTNESS OF BREATH  *UNUSUAL BRUISING OR BLEEDING  TENDERNESS IN MOUTH AND THROAT WITH OR WITHOUT PRESENCE OF ULCERS  *URINARY PROBLEMS  *BOWEL PROBLEMS  UNUSUAL RASH Items with * indicate a potential emergency and should be followed up as soon as possible.  Feel free to call the clinic you have any questions or concerns. The clinic phone number is [phone].  Please show the CHEMO ALERT CARD at check-in to the Emergency Department and triage nurse.  

## 2016-01-26 ENCOUNTER — Ambulatory Visit (HOSPITAL_BASED_OUTPATIENT_CLINIC_OR_DEPARTMENT_OTHER): Payer: Medicare Other

## 2016-01-26 VITALS — BP 108/50 | HR 111 | Resp 18

## 2016-01-26 DIAGNOSIS — D4620 Refractory anemia with excess of blasts, unspecified: Secondary | ICD-10-CM

## 2016-01-26 DIAGNOSIS — Z5111 Encounter for antineoplastic chemotherapy: Secondary | ICD-10-CM | POA: Diagnosis not present

## 2016-01-26 DIAGNOSIS — D4622 Refractory anemia with excess of blasts 2: Secondary | ICD-10-CM | POA: Diagnosis not present

## 2016-01-26 MED ORDER — SODIUM CHLORIDE 0.9 % IV SOLN
Freq: Once | INTRAVENOUS | Status: AC
  Administered 2016-01-26: 10:00:00 via INTRAVENOUS

## 2016-01-26 MED ORDER — PROCHLORPERAZINE MALEATE 10 MG PO TABS
10.0000 mg | ORAL_TABLET | Freq: Once | ORAL | Status: AC
  Administered 2016-01-26: 10 mg via ORAL

## 2016-01-26 MED ORDER — HEPARIN SOD (PORK) LOCK FLUSH 100 UNIT/ML IV SOLN
500.0000 [IU] | Freq: Once | INTRAVENOUS | Status: AC | PRN
  Administered 2016-01-26: 500 [IU]
  Filled 2016-01-26: qty 5

## 2016-01-26 MED ORDER — PROCHLORPERAZINE MALEATE 10 MG PO TABS
ORAL_TABLET | ORAL | Status: AC
  Filled 2016-01-26: qty 1

## 2016-01-26 MED ORDER — SODIUM CHLORIDE 0.9% FLUSH
10.0000 mL | INTRAVENOUS | Status: DC | PRN
  Administered 2016-01-26: 10 mL
  Filled 2016-01-26: qty 10

## 2016-01-26 MED ORDER — SODIUM CHLORIDE 0.9 % IV SOLN
20.0000 mg/m2 | Freq: Once | INTRAVENOUS | Status: AC
  Administered 2016-01-26: 45 mg via INTRAVENOUS
  Filled 2016-01-26: qty 9

## 2016-01-26 NOTE — Patient Instructions (Signed)
[facility] Discharge Instructions for Patients Receiving Chemotherapy  Today you received the following chemotherapy agents Dacogen.  To help prevent nausea and vomiting after your treatment, we encourage you to take your nausea medication.   If you develop nausea and vomiting that is not controlled by your nausea medication, call the clinic.   BELOW ARE SYMPTOMS THAT SHOULD BE REPORTED IMMEDIATELY:  *FEVER GREATER THAN 100.5 F  *CHILLS WITH OR WITHOUT FEVER  NAUSEA AND VOMITING THAT IS NOT CONTROLLED WITH YOUR NAUSEA MEDICATION  *UNUSUAL SHORTNESS OF BREATH  *UNUSUAL BRUISING OR BLEEDING  TENDERNESS IN MOUTH AND THROAT WITH OR WITHOUT PRESENCE OF ULCERS  *URINARY PROBLEMS  *BOWEL PROBLEMS  UNUSUAL RASH Items with * indicate a potential emergency and should be followed up as soon as possible.  Feel free to call the clinic you have any questions or concerns. The clinic phone number is [phone].  Please show the CHEMO ALERT CARD at check-in to the Emergency Department and triage nurse.  

## 2016-01-27 ENCOUNTER — Ambulatory Visit (HOSPITAL_BASED_OUTPATIENT_CLINIC_OR_DEPARTMENT_OTHER): Payer: Medicare Other

## 2016-01-27 ENCOUNTER — Other Ambulatory Visit: Payer: Self-pay | Admitting: Nurse Practitioner

## 2016-01-27 DIAGNOSIS — D4622 Refractory anemia with excess of blasts 2: Secondary | ICD-10-CM | POA: Diagnosis not present

## 2016-01-27 DIAGNOSIS — Z5111 Encounter for antineoplastic chemotherapy: Secondary | ICD-10-CM

## 2016-01-27 DIAGNOSIS — D4620 Refractory anemia with excess of blasts, unspecified: Secondary | ICD-10-CM

## 2016-01-27 MED ORDER — SODIUM CHLORIDE 0.9% FLUSH
3.0000 mL | INTRAVENOUS | Status: DC | PRN
  Filled 2016-01-27: qty 10

## 2016-01-27 MED ORDER — PROCHLORPERAZINE MALEATE 10 MG PO TABS
ORAL_TABLET | ORAL | Status: AC
  Filled 2016-01-27: qty 1

## 2016-01-27 MED ORDER — PROCHLORPERAZINE MALEATE 10 MG PO TABS
10.0000 mg | ORAL_TABLET | Freq: Once | ORAL | Status: AC
  Administered 2016-01-27: 10 mg via ORAL

## 2016-01-27 MED ORDER — HEPARIN SOD (PORK) LOCK FLUSH 100 UNIT/ML IV SOLN
250.0000 [IU] | Freq: Once | INTRAVENOUS | Status: DC | PRN
  Filled 2016-01-27: qty 5

## 2016-01-27 MED ORDER — SODIUM CHLORIDE 0.9 % IV SOLN
20.0000 mg/m2 | Freq: Once | INTRAVENOUS | Status: AC
  Administered 2016-01-27: 45 mg via INTRAVENOUS
  Filled 2016-01-27: qty 9

## 2016-01-27 MED ORDER — HEPARIN SOD (PORK) LOCK FLUSH 100 UNIT/ML IV SOLN
500.0000 [IU] | Freq: Once | INTRAVENOUS | Status: AC | PRN
  Administered 2016-01-27: 500 [IU]
  Filled 2016-01-27: qty 5

## 2016-01-27 MED ORDER — SODIUM CHLORIDE 0.9% FLUSH
10.0000 mL | INTRAVENOUS | Status: DC | PRN
  Administered 2016-01-27: 10 mL
  Filled 2016-01-27: qty 10

## 2016-01-27 MED ORDER — SODIUM CHLORIDE 0.9 % IV SOLN
Freq: Once | INTRAVENOUS | Status: AC
  Administered 2016-01-27: 09:00:00 via INTRAVENOUS

## 2016-01-27 MED ORDER — ALTEPLASE 2 MG IJ SOLR
2.0000 mg | Freq: Once | INTRAMUSCULAR | Status: DC | PRN
  Filled 2016-01-27: qty 2

## 2016-01-27 NOTE — Patient Instructions (Signed)
[facility] Discharge Instructions for Patients Receiving Chemotherapy  Today you received the following chemotherapy agents Decitabine  To help prevent nausea and vomiting after your treatment, we encourage you to take your nausea medication    If you develop nausea and vomiting that is not controlled by your nausea medication, call the clinic.   BELOW ARE SYMPTOMS THAT SHOULD BE REPORTED IMMEDIATELY:  *FEVER GREATER THAN 100.5 F  *CHILLS WITH OR WITHOUT FEVER  NAUSEA AND VOMITING THAT IS NOT CONTROLLED WITH YOUR NAUSEA MEDICATION  *UNUSUAL SHORTNESS OF BREATH  *UNUSUAL BRUISING OR BLEEDING  TENDERNESS IN MOUTH AND THROAT WITH OR WITHOUT PRESENCE OF ULCERS  *URINARY PROBLEMS  *BOWEL PROBLEMS  UNUSUAL RASH Items with * indicate a potential emergency and should be followed up as soon as possible.  Feel free to call the clinic you have any questions or concerns. The clinic phone number is [phone].  Please show the CHEMO ALERT CARD at check-in to the Emergency Department and triage nurse.   

## 2016-01-30 ENCOUNTER — Ambulatory Visit (HOSPITAL_BASED_OUTPATIENT_CLINIC_OR_DEPARTMENT_OTHER): Payer: Medicare Other

## 2016-01-30 ENCOUNTER — Other Ambulatory Visit (HOSPITAL_BASED_OUTPATIENT_CLINIC_OR_DEPARTMENT_OTHER): Payer: Medicare Other

## 2016-01-30 ENCOUNTER — Other Ambulatory Visit: Payer: Medicare Other

## 2016-01-30 DIAGNOSIS — Z5111 Encounter for antineoplastic chemotherapy: Secondary | ICD-10-CM

## 2016-01-30 DIAGNOSIS — D4620 Refractory anemia with excess of blasts, unspecified: Secondary | ICD-10-CM

## 2016-01-30 DIAGNOSIS — D4622 Refractory anemia with excess of blasts 2: Secondary | ICD-10-CM

## 2016-01-30 LAB — CBC WITH DIFFERENTIAL (CANCER CENTER ONLY)
BASO#: 0 10*3/uL (ref 0.0–0.2)
BASO%: 1 % (ref 0.0–2.0)
EOS%: 0.5 % (ref 0.0–7.0)
Eosinophils Absolute: 0 10*3/uL (ref 0.0–0.5)
HEMATOCRIT: 29.5 % — AB (ref 38.7–49.9)
HGB: 9.8 g/dL — ABNORMAL LOW (ref 13.0–17.1)
LYMPH#: 0.5 10*3/uL — ABNORMAL LOW (ref 0.9–3.3)
LYMPH%: 23.1 % (ref 14.0–48.0)
MCH: 34.3 pg — ABNORMAL HIGH (ref 28.0–33.4)
MCHC: 33.2 g/dL (ref 32.0–35.9)
MCV: 103 fL — ABNORMAL HIGH (ref 82–98)
MONO#: 0.3 10*3/uL (ref 0.1–0.9)
MONO%: 14.9 % — AB (ref 0.0–13.0)
NEUT#: 1.3 10*3/uL — ABNORMAL LOW (ref 1.5–6.5)
NEUT%: 60.5 % (ref 40.0–80.0)
PLATELETS: 643 10*3/uL — AB (ref 145–400)
RBC: 2.86 10*6/uL — ABNORMAL LOW (ref 4.20–5.70)
RDW: 22.4 % — AB (ref 11.1–15.7)
WBC: 2.1 10*3/uL — ABNORMAL LOW (ref 4.0–10.0)

## 2016-01-30 LAB — CMP (CANCER CENTER ONLY)
ALK PHOS: 42 U/L (ref 26–84)
ALT: 22 U/L (ref 10–47)
AST: 23 U/L (ref 11–38)
Albumin: 3 g/dL — ABNORMAL LOW (ref 3.3–5.5)
BILIRUBIN TOTAL: 0.6 mg/dL (ref 0.20–1.60)
BUN: 18 mg/dL (ref 7–22)
CO2: 26 meq/L (ref 18–33)
CREATININE: 1.4 mg/dL — AB (ref 0.6–1.2)
Calcium: 8.4 mg/dL (ref 8.0–10.3)
Chloride: 102 mEq/L (ref 98–108)
GLUCOSE: 119 mg/dL — AB (ref 73–118)
Potassium: 4.2 mEq/L (ref 3.3–4.7)
SODIUM: 131 meq/L (ref 128–145)
Total Protein: 6.6 g/dL (ref 6.4–8.1)

## 2016-01-30 LAB — TECHNOLOGIST REVIEW CHCC SATELLITE

## 2016-01-30 MED ORDER — HEPARIN SOD (PORK) LOCK FLUSH 100 UNIT/ML IV SOLN
500.0000 [IU] | Freq: Once | INTRAVENOUS | Status: AC | PRN
  Administered 2016-01-30: 500 [IU]
  Filled 2016-01-30: qty 5

## 2016-01-30 MED ORDER — PROCHLORPERAZINE MALEATE 10 MG PO TABS
ORAL_TABLET | ORAL | Status: AC
Start: 2016-01-30 — End: 2016-01-30
  Filled 2016-01-30: qty 1

## 2016-01-30 MED ORDER — PROCHLORPERAZINE MALEATE 10 MG PO TABS
10.0000 mg | ORAL_TABLET | Freq: Once | ORAL | Status: AC
  Administered 2016-01-30: 10 mg via ORAL

## 2016-01-30 MED ORDER — SODIUM CHLORIDE 0.9 % IV SOLN
20.0000 mg/m2 | Freq: Once | INTRAVENOUS | Status: AC
  Administered 2016-01-30: 45 mg via INTRAVENOUS
  Filled 2016-01-30: qty 9

## 2016-01-30 MED ORDER — SODIUM CHLORIDE 0.9 % IV SOLN
Freq: Once | INTRAVENOUS | Status: AC
  Administered 2016-01-30: 11:00:00 via INTRAVENOUS

## 2016-01-30 MED ORDER — SODIUM CHLORIDE 0.9% FLUSH
10.0000 mL | INTRAVENOUS | Status: DC | PRN
  Administered 2016-01-30: 10 mL
  Filled 2016-01-30: qty 10

## 2016-01-30 NOTE — Patient Instructions (Signed)
[facility] Discharge Instructions for Patients Receiving Chemotherapy  Today you received the following chemotherapy agents Decitabine  To help prevent nausea and vomiting after your treatment, we encourage you to take your nausea medication    If you develop nausea and vomiting that is not controlled by your nausea medication, call the clinic.   BELOW ARE SYMPTOMS THAT SHOULD BE REPORTED IMMEDIATELY:  *FEVER GREATER THAN 100.5 F  *CHILLS WITH OR WITHOUT FEVER  NAUSEA AND VOMITING THAT IS NOT CONTROLLED WITH YOUR NAUSEA MEDICATION  *UNUSUAL SHORTNESS OF BREATH  *UNUSUAL BRUISING OR BLEEDING  TENDERNESS IN MOUTH AND THROAT WITH OR WITHOUT PRESENCE OF ULCERS  *URINARY PROBLEMS  *BOWEL PROBLEMS  UNUSUAL RASH Items with * indicate a potential emergency and should be followed up as soon as possible.  Feel free to call the clinic you have any questions or concerns. The clinic phone number is [phone].  Please show the CHEMO ALERT CARD at check-in to the Emergency Department and triage nurse.   

## 2016-01-31 ENCOUNTER — Ambulatory Visit (HOSPITAL_BASED_OUTPATIENT_CLINIC_OR_DEPARTMENT_OTHER): Payer: Medicare Other

## 2016-01-31 VITALS — BP 113/55 | HR 98 | Temp 97.4°F | Resp 16

## 2016-01-31 DIAGNOSIS — D4622 Refractory anemia with excess of blasts 2: Secondary | ICD-10-CM

## 2016-01-31 DIAGNOSIS — D4620 Refractory anemia with excess of blasts, unspecified: Secondary | ICD-10-CM

## 2016-01-31 DIAGNOSIS — Z5111 Encounter for antineoplastic chemotherapy: Secondary | ICD-10-CM | POA: Diagnosis not present

## 2016-01-31 MED ORDER — PROCHLORPERAZINE MALEATE 10 MG PO TABS
10.0000 mg | ORAL_TABLET | Freq: Once | ORAL | Status: AC
  Administered 2016-01-31: 10 mg via ORAL

## 2016-01-31 MED ORDER — HEPARIN SOD (PORK) LOCK FLUSH 100 UNIT/ML IV SOLN
500.0000 [IU] | Freq: Once | INTRAVENOUS | Status: AC | PRN
  Administered 2016-01-31: 500 [IU]
  Filled 2016-01-31: qty 5

## 2016-01-31 MED ORDER — SODIUM CHLORIDE 0.9 % IV SOLN
Freq: Once | INTRAVENOUS | Status: AC
  Administered 2016-01-31: 10:00:00 via INTRAVENOUS

## 2016-01-31 MED ORDER — SODIUM CHLORIDE 0.9% FLUSH
10.0000 mL | INTRAVENOUS | Status: DC | PRN
  Administered 2016-01-31: 10 mL
  Filled 2016-01-31: qty 10

## 2016-01-31 MED ORDER — SODIUM CHLORIDE 0.9 % IV SOLN
20.0000 mg/m2 | Freq: Once | INTRAVENOUS | Status: AC
  Administered 2016-01-31: 45 mg via INTRAVENOUS
  Filled 2016-01-31: qty 9

## 2016-01-31 MED ORDER — PROCHLORPERAZINE MALEATE 10 MG PO TABS
ORAL_TABLET | ORAL | Status: AC
  Filled 2016-01-31: qty 1

## 2016-01-31 NOTE — Patient Instructions (Signed)
[facility] Discharge Instructions for Patients Receiving Chemotherapy  Today you received the following chemotherapy agents Gemzar.  To help prevent nausea and vomiting after your treatment, we encourage you to take your nausea medication.   If you develop nausea and vomiting that is not controlled by your nausea medication, call the clinic.   BELOW ARE SYMPTOMS THAT SHOULD BE REPORTED IMMEDIATELY:  *FEVER GREATER THAN 100.5 F  *CHILLS WITH OR WITHOUT FEVER  NAUSEA AND VOMITING THAT IS NOT CONTROLLED WITH YOUR NAUSEA MEDICATION  *UNUSUAL SHORTNESS OF BREATH  *UNUSUAL BRUISING OR BLEEDING  TENDERNESS IN MOUTH AND THROAT WITH OR WITHOUT PRESENCE OF ULCERS  *URINARY PROBLEMS  *BOWEL PROBLEMS  UNUSUAL RASH Items with * indicate a potential emergency and should be followed up as soon as possible.  Feel free to call the clinic you have any questions or concerns. The clinic phone number is [phone].  Please show the CHEMO ALERT CARD at check-in to the Emergency Department and triage nurse.   

## 2016-02-03 ENCOUNTER — Other Ambulatory Visit: Payer: Self-pay

## 2016-02-03 DIAGNOSIS — D4620 Refractory anemia with excess of blasts, unspecified: Secondary | ICD-10-CM

## 2016-02-03 DIAGNOSIS — D46Z Other myelodysplastic syndromes: Secondary | ICD-10-CM

## 2016-02-06 ENCOUNTER — Ambulatory Visit (HOSPITAL_BASED_OUTPATIENT_CLINIC_OR_DEPARTMENT_OTHER): Payer: Medicare Other

## 2016-02-06 ENCOUNTER — Other Ambulatory Visit (HOSPITAL_BASED_OUTPATIENT_CLINIC_OR_DEPARTMENT_OTHER): Payer: Medicare Other

## 2016-02-06 VITALS — BP 128/65 | HR 89 | Temp 97.7°F | Resp 16

## 2016-02-06 DIAGNOSIS — Z452 Encounter for adjustment and management of vascular access device: Secondary | ICD-10-CM | POA: Diagnosis not present

## 2016-02-06 DIAGNOSIS — D4622 Refractory anemia with excess of blasts 2: Secondary | ICD-10-CM

## 2016-02-06 DIAGNOSIS — D4620 Refractory anemia with excess of blasts, unspecified: Secondary | ICD-10-CM

## 2016-02-06 LAB — CMP (CANCER CENTER ONLY)
ALBUMIN: 3 g/dL — AB (ref 3.3–5.5)
ALT(SGPT): 24 U/L (ref 10–47)
AST: 19 U/L (ref 11–38)
Alkaline Phosphatase: 50 U/L (ref 26–84)
BUN, Bld: 16 mg/dL (ref 7–22)
CHLORIDE: 106 meq/L (ref 98–108)
CO2: 26 mEq/L (ref 18–33)
CREATININE: 1.1 mg/dL (ref 0.6–1.2)
Calcium: 8.8 mg/dL (ref 8.0–10.3)
Glucose, Bld: 100 mg/dL (ref 73–118)
POTASSIUM: 3.7 meq/L (ref 3.3–4.7)
SODIUM: 136 meq/L (ref 128–145)
TOTAL PROTEIN: 6.8 g/dL (ref 6.4–8.1)
Total Bilirubin: 0.7 mg/dl (ref 0.20–1.60)

## 2016-02-06 LAB — CBC WITH DIFFERENTIAL (CANCER CENTER ONLY)
BASO#: 0 10*3/uL (ref 0.0–0.2)
BASO%: 0.6 % (ref 0.0–2.0)
EOS ABS: 0 10*3/uL (ref 0.0–0.5)
EOS%: 0 % (ref 0.0–7.0)
HCT: 28 % — ABNORMAL LOW (ref 38.7–49.9)
HEMOGLOBIN: 9.3 g/dL — AB (ref 13.0–17.1)
LYMPH#: 0.5 10*3/uL — ABNORMAL LOW (ref 0.9–3.3)
LYMPH%: 33.1 % (ref 14.0–48.0)
MCH: 33.7 pg — AB (ref 28.0–33.4)
MCHC: 33.2 g/dL (ref 32.0–35.9)
MCV: 101 fL — AB (ref 82–98)
MONO#: 0.2 10*3/uL (ref 0.1–0.9)
MONO%: 11 % (ref 0.0–13.0)
NEUT%: 55.3 % (ref 40.0–80.0)
NEUTROS ABS: 0.9 10*3/uL — AB (ref 1.5–6.5)
Platelets: 301 10*3/uL (ref 145–400)
RBC: 2.76 10*6/uL — AB (ref 4.20–5.70)
RDW: 21.9 % — ABNORMAL HIGH (ref 11.1–15.7)
WBC: 1.6 10*3/uL — AB (ref 4.0–10.0)

## 2016-02-06 MED ORDER — SODIUM CHLORIDE 0.9% FLUSH
10.0000 mL | INTRAVENOUS | Status: DC | PRN
  Administered 2016-02-06: 10 mL via INTRAVENOUS
  Filled 2016-02-06: qty 10

## 2016-02-06 MED ORDER — PROCHLORPERAZINE MALEATE 10 MG PO TABS
ORAL_TABLET | ORAL | Status: AC
  Filled 2016-02-06: qty 1

## 2016-02-06 MED ORDER — HEPARIN SOD (PORK) LOCK FLUSH 100 UNIT/ML IV SOLN
500.0000 [IU] | Freq: Once | INTRAVENOUS | Status: AC
  Administered 2016-02-06: 500 [IU] via INTRAVENOUS
  Filled 2016-02-06: qty 5

## 2016-02-06 NOTE — Patient Instructions (Signed)

## 2016-02-06 NOTE — Progress Notes (Signed)
Anthony [name] presented for Portacath access and flush. Proper placement of portacath confirmed by CXR. Portacath located in the right chest wall accessed with  H 20 needle. Clean, Dry and Intact Good blood return present. Portacath flushed with 80ml NS and 500U/39ml Heparin per protocol and needle removed intact. Procedure without incident. Patient tolerated procedure well.

## 2016-02-09 MED ORDER — DIPHENHYDRAMINE HCL 25 MG PO CAPS
ORAL_CAPSULE | ORAL | Status: AC
  Filled 2016-02-09: qty 1

## 2016-02-09 MED ORDER — ACETAMINOPHEN 325 MG PO TABS
ORAL_TABLET | ORAL | Status: AC
  Filled 2016-02-09: qty 2

## 2016-02-10 ENCOUNTER — Other Ambulatory Visit: Payer: Self-pay

## 2016-02-10 DIAGNOSIS — D4620 Refractory anemia with excess of blasts, unspecified: Secondary | ICD-10-CM

## 2016-02-10 MED FILL — *JADENU 360 MG TABLET: 360 | 30 days supply | Qty: 90 | Fill #0

## 2016-02-14 ENCOUNTER — Telehealth: Payer: Self-pay | Admitting: *Deleted

## 2016-02-14 ENCOUNTER — Other Ambulatory Visit (HOSPITAL_BASED_OUTPATIENT_CLINIC_OR_DEPARTMENT_OTHER): Payer: Medicare Other

## 2016-02-14 ENCOUNTER — Ambulatory Visit: Payer: Medicare Other

## 2016-02-14 ENCOUNTER — Other Ambulatory Visit: Payer: Self-pay | Admitting: *Deleted

## 2016-02-14 DIAGNOSIS — D4622 Refractory anemia with excess of blasts 2: Secondary | ICD-10-CM | POA: Diagnosis not present

## 2016-02-14 DIAGNOSIS — D4620 Refractory anemia with excess of blasts, unspecified: Secondary | ICD-10-CM

## 2016-02-14 DIAGNOSIS — D509 Iron deficiency anemia, unspecified: Secondary | ICD-10-CM

## 2016-02-14 LAB — CMP (CANCER CENTER ONLY)
ALT: 23 U/L (ref 10–47)
AST: 20 U/L (ref 11–38)
Albumin: 2.9 g/dL — ABNORMAL LOW (ref 3.3–5.5)
Alkaline Phosphatase: 52 U/L (ref 26–84)
BUN: 16 mg/dL (ref 7–22)
CHLORIDE: 101 meq/L (ref 98–108)
CO2: 28 mEq/L (ref 18–33)
CREATININE: 1.1 mg/dL (ref 0.6–1.2)
Calcium: 8.8 mg/dL (ref 8.0–10.3)
GLUCOSE: 100 mg/dL (ref 73–118)
Potassium: 3.5 mEq/L (ref 3.3–4.7)
SODIUM: 136 meq/L (ref 128–145)
TOTAL PROTEIN: 6.7 g/dL (ref 6.4–8.1)
Total Bilirubin: 0.6 mg/dl (ref 0.20–1.60)

## 2016-02-14 LAB — CBC WITH DIFFERENTIAL (CANCER CENTER ONLY)
BASO#: 0 10*3/uL (ref 0.0–0.2)
BASO%: 2.2 % — AB (ref 0.0–2.0)
EOS%: 0.7 % (ref 0.0–7.0)
Eosinophils Absolute: 0 10*3/uL (ref 0.0–0.5)
HCT: 28.6 % — ABNORMAL LOW (ref 38.7–49.9)
HGB: 9.4 g/dL — ABNORMAL LOW (ref 13.0–17.1)
LYMPH#: 0.6 10*3/uL — ABNORMAL LOW (ref 0.9–3.3)
LYMPH%: 45.3 % (ref 14.0–48.0)
MCH: 34.2 pg — AB (ref 28.0–33.4)
MCHC: 32.9 g/dL (ref 32.0–35.9)
MCV: 104 fL — ABNORMAL HIGH (ref 82–98)
MONO#: 0.2 10*3/uL (ref 0.1–0.9)
MONO%: 17.5 % — AB (ref 0.0–13.0)
NEUT#: 0.5 10*3/uL — CL (ref 1.5–6.5)
NEUT%: 34.3 % — ABNORMAL LOW (ref 40.0–80.0)
PLATELETS: 170 10*3/uL (ref 145–400)
RBC: 2.75 10*6/uL — AB (ref 4.20–5.70)
RDW: 23.2 % — ABNORMAL HIGH (ref 11.1–15.7)
WBC: 1.4 10*3/uL — AB (ref 4.0–10.0)

## 2016-02-14 MED ORDER — LORAZEPAM 0.5 MG PO TABS: 0.5000 mg | ORAL_TABLET | Freq: Four times a day (QID) | ORAL | 0 refills | Status: DC | PRN

## 2016-02-14 MED FILL — LORazepam 0.5 MG TABS: 0.5 | 8 days supply | Qty: 30 | Fill #0

## 2016-02-14 NOTE — Patient Instructions (Signed)

## 2016-02-14 NOTE — Telephone Encounter (Signed)
Received a call from [place] in Lab reporting Absolute Neutrophil count of .5.  Dr. Marin Olp notified.  No orders received

## 2016-02-15 ENCOUNTER — Encounter: Payer: Self-pay | Admitting: Nurse Practitioner

## 2016-02-17 ENCOUNTER — Other Ambulatory Visit: Payer: Self-pay

## 2016-02-17 DIAGNOSIS — D4620 Refractory anemia with excess of blasts, unspecified: Secondary | ICD-10-CM

## 2016-02-20 ENCOUNTER — Other Ambulatory Visit (HOSPITAL_BASED_OUTPATIENT_CLINIC_OR_DEPARTMENT_OTHER): Payer: Medicare Other

## 2016-02-20 ENCOUNTER — Encounter: Payer: Self-pay | Admitting: Family

## 2016-02-20 ENCOUNTER — Other Ambulatory Visit: Payer: Medicare Other

## 2016-02-20 ENCOUNTER — Ambulatory Visit (HOSPITAL_BASED_OUTPATIENT_CLINIC_OR_DEPARTMENT_OTHER): Payer: Medicare Other

## 2016-02-20 ENCOUNTER — Ambulatory Visit (HOSPITAL_BASED_OUTPATIENT_CLINIC_OR_DEPARTMENT_OTHER): Payer: Medicare Other | Admitting: Family

## 2016-02-20 ENCOUNTER — Ambulatory Visit: Payer: Medicare Other

## 2016-02-20 VITALS — BP 133/57 | HR 84 | Temp 97.6°F | Resp 20 | Ht 72.0 in | Wt 209.4 lb

## 2016-02-20 DIAGNOSIS — Z5111 Encounter for antineoplastic chemotherapy: Secondary | ICD-10-CM

## 2016-02-20 DIAGNOSIS — D4621 Refractory anemia with excess of blasts 1: Secondary | ICD-10-CM

## 2016-02-20 DIAGNOSIS — D4620 Refractory anemia with excess of blasts, unspecified: Secondary | ICD-10-CM

## 2016-02-20 LAB — CMP (CANCER CENTER ONLY)
ALBUMIN: 3.1 g/dL — AB (ref 3.3–5.5)
ALT(SGPT): 17 U/L (ref 10–47)
AST: 18 U/L (ref 11–38)
Alkaline Phosphatase: 46 U/L (ref 26–84)
BILIRUBIN TOTAL: 0.6 mg/dL (ref 0.20–1.60)
BUN, Bld: 21 mg/dL (ref 7–22)
CALCIUM: 9 mg/dL (ref 8.0–10.3)
CHLORIDE: 105 meq/L (ref 98–108)
CO2: 25 meq/L (ref 18–33)
Creat: 1.6 mg/dl — ABNORMAL HIGH (ref 0.6–1.2)
GLUCOSE: 112 mg/dL (ref 73–118)
POTASSIUM: 3.8 meq/L (ref 3.3–4.7)
Sodium: 135 mEq/L (ref 128–145)
Total Protein: 6.6 g/dL (ref 6.4–8.1)

## 2016-02-20 LAB — CBC WITH DIFFERENTIAL (CANCER CENTER ONLY)
BASO#: 0.1 10*3/uL (ref 0.0–0.2)
BASO%: 4 % — ABNORMAL HIGH (ref 0.0–2.0)
EOS%: 0 % (ref 0.0–7.0)
Eosinophils Absolute: 0 10*3/uL (ref 0.0–0.5)
HEMATOCRIT: 29.4 % — AB (ref 38.7–49.9)
HEMOGLOBIN: 9.6 g/dL — AB (ref 13.0–17.1)
LYMPH#: 0.3 10*3/uL — ABNORMAL LOW (ref 0.9–3.3)
LYMPH%: 23.2 % (ref 14.0–48.0)
MCH: 34.2 pg — AB (ref 28.0–33.4)
MCHC: 32.7 g/dL (ref 32.0–35.9)
MCV: 105 fL — AB (ref 82–98)
MONO#: 0.1 10*3/uL (ref 0.1–0.9)
MONO%: 10.4 % (ref 0.0–13.0)
NEUT#: 0.8 10*3/uL — ABNORMAL LOW (ref 1.5–6.5)
NEUT%: 62.4 % (ref 40.0–80.0)
Platelets: 278 10*3/uL (ref 145–400)
RBC: 2.81 10*6/uL — ABNORMAL LOW (ref 4.20–5.70)
WBC: 1.3 10*3/uL — ABNORMAL LOW (ref 4.0–10.0)

## 2016-02-20 MED ORDER — PROCHLORPERAZINE MALEATE 10 MG PO TABS
10.0000 mg | ORAL_TABLET | Freq: Once | ORAL | Status: AC
  Administered 2016-02-20: 10 mg via ORAL

## 2016-02-20 MED ORDER — HEPARIN SOD (PORK) LOCK FLUSH 100 UNIT/ML IV SOLN
500.0000 [IU] | Freq: Once | INTRAVENOUS | Status: AC | PRN
  Administered 2016-02-20: 500 [IU]
  Filled 2016-02-20: qty 5

## 2016-02-20 MED ORDER — PROCHLORPERAZINE MALEATE 10 MG PO TABS
ORAL_TABLET | ORAL | Status: AC
  Filled 2016-02-20: qty 1

## 2016-02-20 MED ORDER — DARBEPOETIN ALFA 200 MCG/0.4ML IJ SOSY
PREFILLED_SYRINGE | INTRAMUSCULAR | Status: AC
  Filled 2016-02-20: qty 0.8

## 2016-02-20 MED ORDER — SODIUM CHLORIDE 0.9 % IV SOLN
20.0000 mg/m2 | Freq: Once | INTRAVENOUS | Status: AC
  Administered 2016-02-20: 45 mg via INTRAVENOUS
  Filled 2016-02-20: qty 9

## 2016-02-20 MED ORDER — DARBEPOETIN ALFA 500 MCG/ML IJ SOSY
400.0000 ug | PREFILLED_SYRINGE | Freq: Once | INTRAMUSCULAR | Status: AC
  Administered 2016-02-20: 400 ug via SUBCUTANEOUS

## 2016-02-20 MED ORDER — SODIUM CHLORIDE 0.9% FLUSH
10.0000 mL | INTRAVENOUS | Status: DC | PRN
  Administered 2016-02-20: 10 mL
  Filled 2016-02-20: qty 10

## 2016-02-20 MED ORDER — SODIUM CHLORIDE 0.9 % IV SOLN
Freq: Once | INTRAVENOUS | Status: AC
  Administered 2016-02-20: 11:00:00 via INTRAVENOUS

## 2016-02-20 NOTE — Progress Notes (Signed)
Hematology and Oncology Follow Up Visit  GARED GILLIE [id number] Feb 12, 1941 75 y.o. 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 Vanhoesen is here today with his wife for a follow-up. He is doing well but has had some fatigue and diarrhea since starting the Jadenu last Wednesday (02/15/16). He states that this is tolerable and the diarrhea is controlled with imodium. We will recheck his iron studies in a month.  No fever, chills, n/v, cough, rash, headache, chest pain, palpitations, night sweats, abdominal pain or changes in bladder habits.  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 No episodes of bleeding or bruising. No lymphadenopathy found on exam.  No swelling or tenderness in his extremities.The numbness and tingling in his toes is unchanged. He denies joint aches or "bone" pain. He is using a cane now to ambulate and has had no falls or syncopal episodes.  His appetite is improving and he is staying well hydrated. He still supplements with boost daily. His weight is unchanged.   Medications:    Medication List       Accurate as of 02/20/16 10:11 AM. Always use your most recent med list.          bimatoprost 0.01 % Soln Commonly known as:  LUMIGAN Place 1 drop into both eyes at bedtime.   budesonide-formoterol 160-4.5 MCG/ACT inhaler Commonly known as:  SYMBICORT Inhale 2 puffs into the lungs 2 (two) times daily.   buPROPion 150 MG 24 hr tablet Commonly known as:  WELLBUTRIN XL Take 150 mg by mouth daily.   clopidogrel 75 MG tablet Commonly known as:  PLAVIX Take 75 mg by mouth daily.   co-enzyme Q-10 30 MG capsule Take 100 mg by mouth daily.   Cyanocobalamin 2500  MCG Tabs Take 5,000 mcg by mouth daily.   Deferasirox 360 MG Tabs Commonly known as:  JADENU Take 3 capsules by mouth daily.   fish oil-omega-3 fatty acids 1000 MG capsule Take 1 g by mouth daily.   Flax Seed Oil 1000 MG Caps Take 1,000 mg by mouth daily.   LORazepam 0.5 MG tablet Commonly known as:  ATIVAN Take 1 tablet (0.5 mg total) by mouth every 6 (six) hours as needed (Nausea or vomiting).   pantoprazole 40 MG tablet Commonly known as:  PROTONIX Take 40 mg by mouth daily.   predniSONE 20 MG tablet Commonly known as:  DELTASONE Take 1 tablet (20 mg total) by mouth daily with breakfast.   PROAIR RESPICLICK 606 (90 Base) MCG/ACT Aepb Generic drug:  Albuterol Sulfate Inhale 2 puffs into the lungs every 6 (six) hours as needed.   prochlorperazine 10 MG tablet Commonly known as:  COMPAZINE Take 1 tablet (10 mg total) by mouth every 6 (six) hours as needed (Nausea or vomiting).   ranitidine 300 MG tablet Commonly known as:  ZANTAC Take 300 mg by mouth at bedtime.   sertraline 100 MG tablet Commonly known as:  ZOLOFT Take 100 mg by mouth daily.   SIMBRINZA 1-0.2 % Susp Generic drug:  Brinzolamide-Brimonidine   simvastatin 40 MG tablet Commonly known as:  ZOCOR Take 40 mg by mouth every other day.  tiotropium 18 MCG inhalation capsule Commonly known as:  SPIRIVA Place 18 mcg into inhaler and inhale daily.   Vitamin D3 5000 units Tabs Take by mouth daily.       Allergies: No Known Allergies  Past Medical History, Surgical history, Social history, and Family History were reviewed and updated.  Review of Systems: All other 10 point review of systems is negative.   Physical Exam:  height is 6' (1.829 m) and weight is 209 lb 6.4 oz (95 kg). His oral temperature is 97.6 F (36.4 C). His blood pressure is 133/57 (abnormal) and his pulse is 84. His respiration is 20.   Wt Readings from Last 3 Encounters:  02/20/16 209 lb 6.4 oz (95 kg)  01/23/16 207 lb  (93.9 kg)  12/26/15 206 lb (93.4 kg)    Ocular: Sclerae unicteric, pupils equal, round and reactive to light Ear-nose-throat: Oropharynx clear, dentition fair Lymphatic: No cervical supraclavicular or axillary adenopathy Lungs no rales or rhonchi, good excursion bilaterally Heart regular rate and rhythm, no murmur appreciated Abd soft, nontender, positive bowel sounds, no oliver or spleen tip palpated on exam, no fluid wave MSK no focal spinal tenderness, no joint edema Neuro: non-focal, well-oriented, appropriate affect  Lab Results  Component Value Date   WBC 1.4 (L) 02/14/2016   HGB 9.4 (L) 02/14/2016   HCT 28.6 (L) 02/14/2016   MCV 104 (H) 02/14/2016   PLT 170 02/14/2016   Lab Results  Component Value Date   FERRITIN 3,233 (H) 01/09/2016   IRON 193 (H) 01/09/2016   TIBC 228 01/09/2016   UIBC 34 (L) 01/09/2016   IRONPCTSAT 85 (H) 01/09/2016   Lab Results  Component Value Date   RETICCTPCT 1.7 05/30/2015   RBC 2.75 (L) 02/14/2016   RETICCTABS 39.4 05/30/2015   No results found for: KPAFRELGTCHN, LAMBDASER, KAPLAMBRATIO No results found for: Kandis Cocking [facility] Lab Results  Component Value Date   TOTALPROTELP 7.3 01/01/2014   ALBUMINELP 55.1 (L) 01/01/2014   A1GS 6.9 (H) 01/01/2014   A2GS 8.9 01/01/2014   BETS 7.9 (H) 01/01/2014   BETA2SER 6.7 (H) 01/01/2014   GAMS 14.5 01/01/2014   MSPIKE NOT DET 01/01/2014   SPEI * 01/01/2014     Chemistry      Component Value Date/Time   NA 136 02/14/2016 0933   NA 140 11/08/2015 0948   K 3.5 02/14/2016 0933   K 3.9 11/08/2015 0948   CL 101 02/14/2016 0933   CO2 28 02/14/2016 0933   CO2 24 11/08/2015 0948   BUN 16 02/14/2016 0933   BUN 21.9 11/08/2015 0948   CREATININE 1.1 02/14/2016 0933   CREATININE 1.4 (H) 11/08/2015 0948      Component Value Date/Time   CALCIUM 8.8 02/14/2016 0933   CALCIUM 9.3 11/08/2015 0948   ALKPHOS 52 02/14/2016 0933   ALKPHOS 45 11/08/2015 0948   AST 20 02/14/2016 0933   AST 15  11/08/2015 0948   ALT 23 02/14/2016 0933   ALT 13 11/08/2015 0948   BILITOT 0.60 02/14/2016 0933   BILITOT 0.48 11/08/2015 0948     Impression and Plan: Mr. Hauschild is 75 yo gentleman with myelodysplasia and refractory anemia.  His bone marrow biopsy in June showed refractory anemia with excess blasts - 2.  Hgb is 9.6 so he will receive Aranesp today.   We will proceed with cycle 4 of Decitabine as planned.  He will continue on his same dose of Jadenu and we will repeat iron studies at his  next follow-up to assess his response.  Dr. Marin Olp will plan to repeat a bone marrow biopsy after completion of this cycle of treatment.  He will get a new appointment schedule today. He will contact us with any questions or concerns. We can certainly see him sooner if need be.   Eliezer Bottom, NP 9/11/201710:11 AM

## 2016-02-20 NOTE — Patient Instructions (Addendum)
[place] Discharge Instructions for Patients Receiving Chemotherapy  Today you received the following chemotherapy agents Decitabine  To help prevent nausea and vomiting after your treatment, we encourage you to take your nausea medication    If you develop nausea and vomiting that is not controlled by your nausea medication, call the clinic.   BELOW ARE SYMPTOMS THAT SHOULD BE REPORTED IMMEDIATELY:  *FEVER GREATER THAN 100.5 F  *CHILLS WITH OR WITHOUT FEVER  NAUSEA AND VOMITING THAT IS NOT CONTROLLED WITH YOUR NAUSEA MEDICATION  *UNUSUAL SHORTNESS OF BREATH  *UNUSUAL BRUISING OR BLEEDING  TENDERNESS IN MOUTH AND THROAT WITH OR WITHOUT PRESENCE OF ULCERS  *URINARY PROBLEMS  *BOWEL PROBLEMS  UNUSUAL RASH Items with * indicate a potential emergency and should be followed up as soon as possible.  Feel free to call the clinic you have any questions or concerns. The clinic phone number is [phone].  Please show the [place] at check-in to the Emergency Department and triage nurse.  Darbepoetin Alfa injection What is this medicine? DARBEPOETIN ALFA (dar be POE e tin AL fa) helps your body make more red blood cells. It is used to treat anemia caused by chronic kidney failure and chemotherapy. This medicine may be used for other purposes; ask your health care provider or pharmacist if you have questions. What should I tell my health care provider before I take this medicine? They need to know if you have any of these conditions: -blood clotting disorders or history of blood clots -cancer patient not on chemotherapy -cystic fibrosis -heart disease, such as angina, heart failure, or a history of a heart attack -hemoglobin level of 12 g/dL or greater -high blood pressure -low levels of folate, iron, or vitamin B12 -seizures -an unusual or allergic reaction to darbepoetin, erythropoietin, albumin, hamster proteins, latex, other medicines, foods,  dyes, or preservatives -pregnant or trying to get pregnant -breast-feeding How should I use this medicine? This medicine is for injection into a vein or under the skin. It is usually given by a health care professional in a hospital or clinic setting. If you get this medicine at home, you will be taught how to prepare and give this medicine. Do not shake the solution before you withdraw a dose. Use exactly as directed. Take your medicine at regular intervals. Do not take your medicine more often than directed. It is important that you put your used needles and syringes in a special sharps container. Do not put them in a trash can. If you do not have a sharps container, call your pharmacist or healthcare provider to get one. Talk to your pediatrician regarding the use of this medicine in children. While this medicine may be used in children as young as 1 year for selected conditions, precautions do apply. Overdosage: If you think you have taken too much of this medicine contact a poison control center or emergency room at once. NOTE: This medicine is only for you. Do not share this medicine with others. What if I miss a dose? If you miss a dose, take it as soon as you can. If it is almost time for your next dose, take only that dose. Do not take double or extra doses. What may interact with this medicine? Do not take this medicine with any of the following medications: -epoetin alfa This list may not describe all possible interactions. Give your health care provider a list of all the medicines, herbs, non-prescription drugs, or dietary supplements you  use. Also tell them if you smoke, drink alcohol, or use illegal drugs. Some items may interact with your medicine. What should I watch for while using this medicine? Visit your prescriber or health care professional for regular checks on your progress and for the needed blood tests and blood pressure measurements. It is especially important for the  doctor to make sure your hemoglobin level is in the desired range, to limit the risk of potential side effects and to give you the best benefit. Keep all appointments for any recommended tests. Check your blood pressure as directed. Ask your doctor what your blood pressure should be and when you should contact him or her. As your body makes more red blood cells, you may need to take iron, folic acid, or vitamin B supplements. Ask your doctor or health care provider which products are right for you. If you have kidney disease continue dietary restrictions, even though this medication can make you feel better. Talk with your doctor or health care professional about the foods you eat and the vitamins that you take. What side effects may I notice from receiving this medicine? Side effects that you should report to your doctor or health care professional as soon as possible: -allergic reactions like skin rash, itching or hives, swelling of the face, lips, or tongue -breathing problems -changes in vision -chest pain -confusion, trouble speaking or understanding -feeling faint or lightheaded, falls -high blood pressure -muscle aches or pains -pain, swelling, warmth in the leg -rapid weight gain -severe headaches -sudden numbness or weakness of the face, arm or leg -trouble walking, dizziness, loss of balance or coordination -seizures (convulsions) -swelling of the ankles, feet, hands -unusually weak or tired Side effects that usually do not require medical attention (report to your doctor or health care professional if they continue or are bothersome): -diarrhea -fever, chills (flu-like symptoms) -headaches -nausea, vomiting -redness, stinging, or swelling at site where injected This list may not describe all possible side effects. Call your doctor for medical advice about side effects. You may report side effects to FDA at 1-800-FDA-1088. Where should I keep my medicine? Keep out of the reach  of children. Store in a refrigerator between 2 and 8 degrees C (36 and 46 degrees F). Do not freeze. Do not shake. Throw away any unused portion if using a single-dose vial. Throw away any unused medicine after the expiration date. NOTE: This sheet is a summary. It may not cover all possible information. If you have questions about this medicine, talk to your doctor, pharmacist, or health care provider.    2016, [employer]. (2008-05-11 10:23:57)

## 2016-02-20 NOTE — Patient Instructions (Signed)

## 2016-02-20 NOTE — Progress Notes (Signed)
10:40 AM OK to treat with creatinine 1.6 per [facility].

## 2016-02-21 ENCOUNTER — Ambulatory Visit (HOSPITAL_BASED_OUTPATIENT_CLINIC_OR_DEPARTMENT_OTHER): Payer: Medicare Other

## 2016-02-21 VITALS — BP 132/57 | HR 85 | Temp 97.4°F | Resp 18

## 2016-02-21 DIAGNOSIS — D4621 Refractory anemia with excess of blasts 1: Secondary | ICD-10-CM | POA: Diagnosis not present

## 2016-02-21 DIAGNOSIS — Z5111 Encounter for antineoplastic chemotherapy: Secondary | ICD-10-CM

## 2016-02-21 DIAGNOSIS — D4620 Refractory anemia with excess of blasts, unspecified: Secondary | ICD-10-CM

## 2016-02-21 MED ORDER — SODIUM CHLORIDE 0.9% FLUSH
10.0000 mL | INTRAVENOUS | Status: DC | PRN
  Administered 2016-02-21: 10 mL
  Filled 2016-02-21: qty 10

## 2016-02-21 MED ORDER — SODIUM CHLORIDE 0.9 % IV SOLN
Freq: Once | INTRAVENOUS | Status: AC
  Administered 2016-02-21: 09:00:00 via INTRAVENOUS

## 2016-02-21 MED ORDER — PROCHLORPERAZINE MALEATE 10 MG PO TABS
ORAL_TABLET | ORAL | Status: AC
  Filled 2016-02-21: qty 1

## 2016-02-21 MED ORDER — PROCHLORPERAZINE MALEATE 10 MG PO TABS
10.0000 mg | ORAL_TABLET | Freq: Once | ORAL | Status: AC
  Administered 2016-02-21: 10 mg via ORAL

## 2016-02-21 MED ORDER — SODIUM CHLORIDE 0.9 % IV SOLN
20.0000 mg/m2 | Freq: Once | INTRAVENOUS | Status: AC
  Administered 2016-02-21: 45 mg via INTRAVENOUS
  Filled 2016-02-21: qty 9

## 2016-02-21 MED ORDER — HEPARIN SOD (PORK) LOCK FLUSH 100 UNIT/ML IV SOLN
500.0000 [IU] | Freq: Once | INTRAVENOUS | Status: AC | PRN
  Administered 2016-02-21: 500 [IU]
  Filled 2016-02-21: qty 5

## 2016-02-21 NOTE — Patient Instructions (Signed)
[place] Discharge Instructions for Patients Receiving Chemotherapy  Today you received the following chemotherapy agents Decitabine  To help prevent nausea and vomiting after your treatment, we encourage you to take your nausea medication    If you develop nausea and vomiting that is not controlled by your nausea medication, call the clinic.   BELOW ARE SYMPTOMS THAT SHOULD BE REPORTED IMMEDIATELY:  *FEVER GREATER THAN 100.5 F  *CHILLS WITH OR WITHOUT FEVER  NAUSEA AND VOMITING THAT IS NOT CONTROLLED WITH YOUR NAUSEA MEDICATION  *UNUSUAL SHORTNESS OF BREATH  *UNUSUAL BRUISING OR BLEEDING  TENDERNESS IN MOUTH AND THROAT WITH OR WITHOUT PRESENCE OF ULCERS  *URINARY PROBLEMS  *BOWEL PROBLEMS  UNUSUAL RASH Items with * indicate a potential emergency and should be followed up as soon as possible.  Feel free to call the clinic you have any questions or concerns. The clinic phone number is [phone].  Please show the [place] at check-in to the Emergency Department and triage nurse.  Darbepoetin Alfa injection What is this medicine? DARBEPOETIN ALFA (dar be POE e tin AL fa) helps your body make more red blood cells. It is used to treat anemia caused by chronic kidney failure and chemotherapy. This medicine may be used for other purposes; ask your health care provider or pharmacist if you have questions. What should I tell my health care provider before I take this medicine? They need to know if you have any of these conditions: -blood clotting disorders or history of blood clots -cancer patient not on chemotherapy -cystic fibrosis -heart disease, such as angina, heart failure, or a history of a heart attack -hemoglobin level of 12 g/dL or greater -high blood pressure -low levels of folate, iron, or vitamin B12 -seizures -an unusual or allergic reaction to darbepoetin, erythropoietin, albumin, hamster proteins, latex, other medicines, foods,  dyes, or preservatives -pregnant or trying to get pregnant -breast-feeding How should I use this medicine? This medicine is for injection into a vein or under the skin. It is usually given by a health care professional in a hospital or clinic setting. If you get this medicine at home, you will be taught how to prepare and give this medicine. Do not shake the solution before you withdraw a dose. Use exactly as directed. Take your medicine at regular intervals. Do not take your medicine more often than directed. It is important that you put your used needles and syringes in a special sharps container. Do not put them in a trash can. If you do not have a sharps container, call your pharmacist or healthcare provider to get one. Talk to your pediatrician regarding the use of this medicine in children. While this medicine may be used in children as young as 1 year for selected conditions, precautions do apply. Overdosage: If you think you have taken too much of this medicine contact a poison control center or emergency room at once. NOTE: This medicine is only for you. Do not share this medicine with others. What if I miss a dose? If you miss a dose, take it as soon as you can. If it is almost time for your next dose, take only that dose. Do not take double or extra doses. What may interact with this medicine? Do not take this medicine with any of the following medications: -epoetin alfa This list may not describe all possible interactions. Give your health care provider a list of all the medicines, herbs, non-prescription drugs, or dietary supplements you  use. Also tell them if you smoke, drink alcohol, or use illegal drugs. Some items may interact with your medicine. What should I watch for while using this medicine? Visit your prescriber or health care professional for regular checks on your progress and for the needed blood tests and blood pressure measurements. It is especially important for the  doctor to make sure your hemoglobin level is in the desired range, to limit the risk of potential side effects and to give you the best benefit. Keep all appointments for any recommended tests. Check your blood pressure as directed. Ask your doctor what your blood pressure should be and when you should contact him or her. As your body makes more red blood cells, you may need to take iron, folic acid, or vitamin B supplements. Ask your doctor or health care provider which products are right for you. If you have kidney disease continue dietary restrictions, even though this medication can make you feel better. Talk with your doctor or health care professional about the foods you eat and the vitamins that you take. What side effects may I notice from receiving this medicine? Side effects that you should report to your doctor or health care professional as soon as possible: -allergic reactions like skin rash, itching or hives, swelling of the face, lips, or tongue -breathing problems -changes in vision -chest pain -confusion, trouble speaking or understanding -feeling faint or lightheaded, falls -high blood pressure -muscle aches or pains -pain, swelling, warmth in the leg -rapid weight gain -severe headaches -sudden numbness or weakness of the face, arm or leg -trouble walking, dizziness, loss of balance or coordination -seizures (convulsions) -swelling of the ankles, feet, hands -unusually weak or tired Side effects that usually do not require medical attention (report to your doctor or health care professional if they continue or are bothersome): -diarrhea -fever, chills (flu-like symptoms) -headaches -nausea, vomiting -redness, stinging, or swelling at site where injected This list may not describe all possible side effects. Call your doctor for medical advice about side effects. You may report side effects to FDA at 1-800-FDA-1088. Where should I keep my medicine? Keep out of the reach  of children. Store in a refrigerator between 2 and 8 degrees C (36 and 46 degrees F). Do not freeze. Do not shake. Throw away any unused portion if using a single-dose vial. Throw away any unused medicine after the expiration date. NOTE: This sheet is a summary. It may not cover all possible information. If you have questions about this medicine, talk to your doctor, pharmacist, or health care provider.    2016, [employer]. (2008-05-11 10:23:57)

## 2016-02-22 ENCOUNTER — Ambulatory Visit (HOSPITAL_BASED_OUTPATIENT_CLINIC_OR_DEPARTMENT_OTHER): Payer: Medicare Other

## 2016-02-22 ENCOUNTER — Telehealth: Payer: Self-pay | Admitting: Oncology

## 2016-02-22 VITALS — BP 151/58 | HR 90 | Temp 97.5°F | Resp 20

## 2016-02-22 DIAGNOSIS — Z5111 Encounter for antineoplastic chemotherapy: Secondary | ICD-10-CM | POA: Diagnosis not present

## 2016-02-22 DIAGNOSIS — D4621 Refractory anemia with excess of blasts 1: Secondary | ICD-10-CM | POA: Diagnosis not present

## 2016-02-22 DIAGNOSIS — D4620 Refractory anemia with excess of blasts, unspecified: Secondary | ICD-10-CM

## 2016-02-22 MED ORDER — SODIUM CHLORIDE 0.9 % IV SOLN
20.0000 mg/m2 | Freq: Once | INTRAVENOUS | Status: AC
  Administered 2016-02-22: 45 mg via INTRAVENOUS
  Filled 2016-02-22: qty 9

## 2016-02-22 MED ORDER — HEPARIN SOD (PORK) LOCK FLUSH 100 UNIT/ML IV SOLN
500.0000 [IU] | Freq: Once | INTRAVENOUS | Status: AC | PRN
  Administered 2016-02-22: 500 [IU]
  Filled 2016-02-22: qty 5

## 2016-02-22 MED ORDER — SODIUM CHLORIDE 0.9% FLUSH
10.0000 mL | INTRAVENOUS | Status: DC | PRN
  Administered 2016-02-22: 10 mL
  Filled 2016-02-22: qty 10

## 2016-02-22 MED ORDER — PROCHLORPERAZINE MALEATE 10 MG PO TABS
10.0000 mg | ORAL_TABLET | Freq: Once | ORAL | Status: AC
  Administered 2016-02-22: 10 mg via ORAL

## 2016-02-22 MED ORDER — SODIUM CHLORIDE 0.9 % IV SOLN
Freq: Once | INTRAVENOUS | Status: AC
  Administered 2016-02-22: 08:00:00 via INTRAVENOUS

## 2016-02-22 MED ORDER — PROCHLORPERAZINE MALEATE 10 MG PO TABS
ORAL_TABLET | ORAL | Status: AC
  Filled 2016-02-22: qty 1

## 2016-02-22 NOTE — Patient Instructions (Signed)
[place] Discharge Instructions for Patients Receiving Chemotherapy  Today you received the following chemotherapy agents Decitabine  To help prevent nausea and vomiting after your treatment, we encourage you to take your nausea medication    If you develop nausea and vomiting that is not controlled by your nausea medication, call the clinic.   BELOW ARE SYMPTOMS THAT SHOULD BE REPORTED IMMEDIATELY:  *FEVER GREATER THAN 100.5 F  *CHILLS WITH OR WITHOUT FEVER  NAUSEA AND VOMITING THAT IS NOT CONTROLLED WITH YOUR NAUSEA MEDICATION  *UNUSUAL SHORTNESS OF BREATH  *UNUSUAL BRUISING OR BLEEDING  TENDERNESS IN MOUTH AND THROAT WITH OR WITHOUT PRESENCE OF ULCERS  *URINARY PROBLEMS  *BOWEL PROBLEMS  UNUSUAL RASH Items with * indicate a potential emergency and should be followed up as soon as possible.  Feel free to call the clinic you have any questions or concerns. The clinic phone number is [phone].  Please show the [place] at check-in to the Emergency Department and triage nurse.  Darbepoetin Alfa injection What is this medicine? DARBEPOETIN ALFA (dar be POE e tin AL fa) helps your body make more red blood cells. It is used to treat anemia caused by chronic kidney failure and chemotherapy. This medicine may be used for other purposes; ask your health care provider or pharmacist if you have questions. What should I tell my health care provider before I take this medicine? They need to know if you have any of these conditions: -blood clotting disorders or history of blood clots -cancer patient not on chemotherapy -cystic fibrosis -heart disease, such as angina, heart failure, or a history of a heart attack -hemoglobin level of 12 g/dL or greater -high blood pressure -low levels of folate, iron, or vitamin B12 -seizures -an unusual or allergic reaction to darbepoetin, erythropoietin, albumin, hamster proteins, latex, other medicines, foods,  dyes, or preservatives -pregnant or trying to get pregnant -breast-feeding How should I use this medicine? This medicine is for injection into a vein or under the skin. It is usually given by a health care professional in a hospital or clinic setting. If you get this medicine at home, you will be taught how to prepare and give this medicine. Do not shake the solution before you withdraw a dose. Use exactly as directed. Take your medicine at regular intervals. Do not take your medicine more often than directed. It is important that you put your used needles and syringes in a special sharps container. Do not put them in a trash can. If you do not have a sharps container, call your pharmacist or healthcare provider to get one. Talk to your pediatrician regarding the use of this medicine in children. While this medicine may be used in children as young as 1 year for selected conditions, precautions do apply. Overdosage: If you think you have taken too much of this medicine contact a poison control center or emergency room at once. NOTE: This medicine is only for you. Do not share this medicine with others. What if I miss a dose? If you miss a dose, take it as soon as you can. If it is almost time for your next dose, take only that dose. Do not take double or extra doses. What may interact with this medicine? Do not take this medicine with any of the following medications: -epoetin alfa This list may not describe all possible interactions. Give your health care provider a list of all the medicines, herbs, non-prescription drugs, or dietary supplements you  use. Also tell them if you smoke, drink alcohol, or use illegal drugs. Some items may interact with your medicine. What should I watch for while using this medicine? Visit your prescriber or health care professional for regular checks on your progress and for the needed blood tests and blood pressure measurements. It is especially important for the  doctor to make sure your hemoglobin level is in the desired range, to limit the risk of potential side effects and to give you the best benefit. Keep all appointments for any recommended tests. Check your blood pressure as directed. Ask your doctor what your blood pressure should be and when you should contact him or her. As your body makes more red blood cells, you may need to take iron, folic acid, or vitamin B supplements. Ask your doctor or health care provider which products are right for you. If you have kidney disease continue dietary restrictions, even though this medication can make you feel better. Talk with your doctor or health care professional about the foods you eat and the vitamins that you take. What side effects may I notice from receiving this medicine? Side effects that you should report to your doctor or health care professional as soon as possible: -allergic reactions like skin rash, itching or hives, swelling of the face, lips, or tongue -breathing problems -changes in vision -chest pain -confusion, trouble speaking or understanding -feeling faint or lightheaded, falls -high blood pressure -muscle aches or pains -pain, swelling, warmth in the leg -rapid weight gain -severe headaches -sudden numbness or weakness of the face, arm or leg -trouble walking, dizziness, loss of balance or coordination -seizures (convulsions) -swelling of the ankles, feet, hands -unusually weak or tired Side effects that usually do not require medical attention (report to your doctor or health care professional if they continue or are bothersome): -diarrhea -fever, chills (flu-like symptoms) -headaches -nausea, vomiting -redness, stinging, or swelling at site where injected This list may not describe all possible side effects. Call your doctor for medical advice about side effects. You may report side effects to FDA at 1-800-FDA-1088. Where should I keep my medicine? Keep out of the reach  of children. Store in a refrigerator between 2 and 8 degrees C (36 and 46 degrees F). Do not freeze. Do not shake. Throw away any unused portion if using a single-dose vial. Throw away any unused medicine after the expiration date. NOTE: This sheet is a summary. It may not cover all possible information. If you have questions about this medicine, talk to your doctor, pharmacist, or health care provider.    2016, [employer]. (2008-05-11 10:23:57)

## 2016-02-23 ENCOUNTER — Ambulatory Visit (HOSPITAL_BASED_OUTPATIENT_CLINIC_OR_DEPARTMENT_OTHER): Payer: Medicare Other

## 2016-02-23 VITALS — BP 144/56 | HR 97 | Temp 97.0°F | Resp 20

## 2016-02-23 DIAGNOSIS — D4620 Refractory anemia with excess of blasts, unspecified: Secondary | ICD-10-CM

## 2016-02-23 DIAGNOSIS — D4621 Refractory anemia with excess of blasts 1: Secondary | ICD-10-CM

## 2016-02-23 DIAGNOSIS — Z5111 Encounter for antineoplastic chemotherapy: Secondary | ICD-10-CM

## 2016-02-23 MED ORDER — HEPARIN SOD (PORK) LOCK FLUSH 100 UNIT/ML IV SOLN
500.0000 [IU] | Freq: Once | INTRAVENOUS | Status: AC | PRN
  Administered 2016-02-23: 500 [IU]
  Filled 2016-02-23: qty 5

## 2016-02-23 MED ORDER — PROCHLORPERAZINE MALEATE 10 MG PO TABS
10.0000 mg | ORAL_TABLET | Freq: Once | ORAL | Status: AC
  Administered 2016-02-23: 10 mg via ORAL

## 2016-02-23 MED ORDER — PROCHLORPERAZINE MALEATE 10 MG PO TABS
ORAL_TABLET | ORAL | Status: AC
  Filled 2016-02-23: qty 1

## 2016-02-23 MED ORDER — SODIUM CHLORIDE 0.9 % IV SOLN
Freq: Once | INTRAVENOUS | Status: AC
  Administered 2016-02-23: 09:00:00 via INTRAVENOUS

## 2016-02-23 MED ORDER — SODIUM CHLORIDE 0.9% FLUSH
10.0000 mL | INTRAVENOUS | Status: DC | PRN
  Administered 2016-02-23: 10 mL
  Filled 2016-02-23: qty 10

## 2016-02-23 MED ORDER — DECITABINE CHEMO INJECTION 50 MG
20.0000 mg/m2 | Freq: Once | INTRAVENOUS | Status: AC
  Administered 2016-02-23: 45 mg via INTRAVENOUS
  Filled 2016-02-23: qty 9

## 2016-02-23 MED FILL — predniSONE 20 MG TABS: 20 | 30 days supply | Qty: 30 | Fill #4

## 2016-02-23 NOTE — Patient Instructions (Signed)
[facility] Discharge Instructions for Patients Receiving Chemotherapy  Today you received the following chemotherapy agents Decitabine  To help prevent nausea and vomiting after your treatment, we encourage you to take your nausea medication    If you develop nausea and vomiting that is not controlled by your nausea medication, call the clinic.   BELOW ARE SYMPTOMS THAT SHOULD BE REPORTED IMMEDIATELY:  *FEVER GREATER THAN 100.5 F  *CHILLS WITH OR WITHOUT FEVER  NAUSEA AND VOMITING THAT IS NOT CONTROLLED WITH YOUR NAUSEA MEDICATION  *UNUSUAL SHORTNESS OF BREATH  *UNUSUAL BRUISING OR BLEEDING  TENDERNESS IN MOUTH AND THROAT WITH OR WITHOUT PRESENCE OF ULCERS  *URINARY PROBLEMS  *BOWEL PROBLEMS  UNUSUAL RASH Items with * indicate a potential emergency and should be followed up as soon as possible.  Feel free to call the clinic you have any questions or concerns. The clinic phone number is [phone].  Please show the [place] at check-in to the Emergency Department and triage nurse.  Darbepoetin Alfa injection What is this medicine? DARBEPOETIN ALFA (dar be POE e tin AL fa) helps your body make more red blood cells. It is used to treat anemia caused by chronic kidney failure and chemotherapy. This medicine may be used for other purposes; ask your health care provider or pharmacist if you have questions. What should I tell my health care provider before I take this medicine? They need to know if you have any of these conditions: -blood clotting disorders or history of blood clots -cancer patient not on chemotherapy -cystic fibrosis -heart disease, such as angina, heart failure, or a history of a heart attack -hemoglobin level of 12 g/dL or greater -high blood pressure -low levels of folate, iron, or vitamin B12 -seizures -an unusual or allergic reaction to darbepoetin, erythropoietin, albumin, hamster proteins, latex, other medicines, foods,  dyes, or preservatives -pregnant or trying to get pregnant -breast-feeding How should I use this medicine? This medicine is for injection into a vein or under the skin. It is usually given by a health care professional in a hospital or clinic setting. If you get this medicine at home, you will be taught how to prepare and give this medicine. Do not shake the solution before you withdraw a dose. Use exactly as directed. Take your medicine at regular intervals. Do not take your medicine more often than directed. It is important that you put your used needles and syringes in a special sharps container. Do not put them in a trash can. If you do not have a sharps container, call your pharmacist or healthcare provider to get one. Talk to your pediatrician regarding the use of this medicine in children. While this medicine may be used in children as young as 1 year for selected conditions, precautions do apply. Overdosage: If you think you have taken too much of this medicine contact a poison control center or emergency room at once. NOTE: This medicine is only for you. Do not share this medicine with others. What if I miss a dose? If you miss a dose, take it as soon as you can. If it is almost time for your next dose, take only that dose. Do not take double or extra doses. What may interact with this medicine? Do not take this medicine with any of the following medications: -epoetin alfa This list may not describe all possible interactions. Give your health care provider a list of all the medicines, herbs, non-prescription drugs, or dietary supplements you  use. Also tell them if you smoke, drink alcohol, or use illegal drugs. Some items may interact with your medicine. What should I watch for while using this medicine? Visit your prescriber or health care professional for regular checks on your progress and for the needed blood tests and blood pressure measurements. It is especially important for the  doctor to make sure your hemoglobin level is in the desired range, to limit the risk of potential side effects and to give you the best benefit. Keep all appointments for any recommended tests. Check your blood pressure as directed. Ask your doctor what your blood pressure should be and when you should contact him or her. As your body makes more red blood cells, you may need to take iron, folic acid, or vitamin B supplements. Ask your doctor or health care provider which products are right for you. If you have kidney disease continue dietary restrictions, even though this medication can make you feel better. Talk with your doctor or health care professional about the foods you eat and the vitamins that you take. What side effects may I notice from receiving this medicine? Side effects that you should report to your doctor or health care professional as soon as possible: -allergic reactions like skin rash, itching or hives, swelling of the face, lips, or tongue -breathing problems -changes in vision -chest pain -confusion, trouble speaking or understanding -feeling faint or lightheaded, falls -high blood pressure -muscle aches or pains -pain, swelling, warmth in the leg -rapid weight gain -severe headaches -sudden numbness or weakness of the face, arm or leg -trouble walking, dizziness, loss of balance or coordination -seizures (convulsions) -swelling of the ankles, feet, hands -unusually weak or tired Side effects that usually do not require medical attention (report to your doctor or health care professional if they continue or are bothersome): -diarrhea -fever, chills (flu-like symptoms) -headaches -nausea, vomiting -redness, stinging, or swelling at site where injected This list may not describe all possible side effects. Call your doctor for medical advice about side effects. You may report side effects to FDA at 1-800-FDA-1088. Where should I keep my medicine? Keep out of the reach  of children. Store in a refrigerator between 2 and 8 degrees C (36 and 46 degrees F). Do not freeze. Do not shake. Throw away any unused portion if using a single-dose vial. Throw away any unused medicine after the expiration date. NOTE: This sheet is a summary. It may not cover all possible information. If you have questions about this medicine, talk to your doctor, pharmacist, or health care provider.    2016, [employer]. (2008-05-11 10:23:57)

## 2016-02-24 ENCOUNTER — Ambulatory Visit (HOSPITAL_BASED_OUTPATIENT_CLINIC_OR_DEPARTMENT_OTHER): Payer: Medicare Other

## 2016-02-24 VITALS — BP 129/62 | HR 92 | Resp 20

## 2016-02-24 DIAGNOSIS — Z5111 Encounter for antineoplastic chemotherapy: Secondary | ICD-10-CM | POA: Diagnosis not present

## 2016-02-24 DIAGNOSIS — D4621 Refractory anemia with excess of blasts 1: Secondary | ICD-10-CM

## 2016-02-24 DIAGNOSIS — D4620 Refractory anemia with excess of blasts, unspecified: Secondary | ICD-10-CM

## 2016-02-24 MED ORDER — PROCHLORPERAZINE MALEATE 10 MG PO TABS
ORAL_TABLET | ORAL | Status: AC
  Filled 2016-02-24: qty 1

## 2016-02-24 MED ORDER — HEPARIN SOD (PORK) LOCK FLUSH 100 UNIT/ML IV SOLN
500.0000 [IU] | Freq: Once | INTRAVENOUS | Status: AC | PRN
  Administered 2016-02-24: 500 [IU]
  Filled 2016-02-24: qty 5

## 2016-02-24 MED ORDER — SODIUM CHLORIDE 0.9 % IV SOLN
20.0000 mg/m2 | Freq: Once | INTRAVENOUS | Status: AC
  Administered 2016-02-24: 45 mg via INTRAVENOUS
  Filled 2016-02-24: qty 9

## 2016-02-24 MED ORDER — PROCHLORPERAZINE MALEATE 10 MG PO TABS
10.0000 mg | ORAL_TABLET | Freq: Once | ORAL | Status: AC
  Administered 2016-02-24: 10 mg via ORAL

## 2016-02-24 MED ORDER — SODIUM CHLORIDE 0.9% FLUSH
10.0000 mL | INTRAVENOUS | Status: DC | PRN
  Administered 2016-02-24: 10 mL
  Filled 2016-02-24: qty 10

## 2016-02-24 MED ORDER — SODIUM CHLORIDE 0.9 % IV SOLN
Freq: Once | INTRAVENOUS | Status: AC
  Administered 2016-02-24: 09:00:00 via INTRAVENOUS

## 2016-02-24 NOTE — Patient Instructions (Signed)
[facility] Discharge Instructions for Patients Receiving Chemotherapy  Today you received the following chemotherapy agents Decitabine  To help prevent nausea and vomiting after your treatment, we encourage you to take your nausea medication    If you develop nausea and vomiting that is not controlled by your nausea medication, call the clinic.   BELOW ARE SYMPTOMS THAT SHOULD BE REPORTED IMMEDIATELY:  *FEVER GREATER THAN 100.5 F  *CHILLS WITH OR WITHOUT FEVER  NAUSEA AND VOMITING THAT IS NOT CONTROLLED WITH YOUR NAUSEA MEDICATION  *UNUSUAL SHORTNESS OF BREATH  *UNUSUAL BRUISING OR BLEEDING  TENDERNESS IN MOUTH AND THROAT WITH OR WITHOUT PRESENCE OF ULCERS  *URINARY PROBLEMS  *BOWEL PROBLEMS  UNUSUAL RASH Items with * indicate a potential emergency and should be followed up as soon as possible.  Feel free to call the clinic you have any questions or concerns. The clinic phone number is [phone].  Please show the [place] [facility] at check-in to the Emergency Department and triage nurse.  Darbepoetin Alfa injection What is this medicine? DARBEPOETIN ALFA (dar be POE e tin AL fa) helps your body make more red blood cells. It is used to treat anemia caused by chronic kidney failure and chemotherapy. This medicine may be used for other purposes; ask your health care provider or pharmacist if you have questions. What should I tell my health care provider before I take this medicine? They need to know if you have any of these conditions: -blood clotting disorders or history of blood clots -cancer patient not on chemotherapy -cystic fibrosis -heart disease, such as angina, heart failure, or a history of a heart attack -hemoglobin level of 12 g/dL or greater -high blood pressure -low levels of folate, iron, or vitamin B12 -seizures -an unusual or allergic reaction to darbepoetin, erythropoietin, albumin, hamster proteins, latex, other medicines, foods,  dyes, or preservatives -pregnant or trying to get pregnant -breast-feeding How should I use this medicine? This medicine is for injection into a vein or under the skin. It is usually given by a health care professional in a hospital or clinic setting. If you get this medicine at home, you will be taught how to prepare and give this medicine. Do not shake the solution before you withdraw a dose. Use exactly as directed. Take your medicine at regular intervals. Do not take your medicine more often than directed. It is important that you put your used needles and syringes in a special sharps container. Do not put them in a trash can. If you do not have a sharps container, call your pharmacist or healthcare provider to get one. Talk to your pediatrician regarding the use of this medicine in children. While this medicine may be used in children as young as 1 year for selected conditions, precautions do apply. Overdosage: If you think you have taken too much of this medicine contact a poison control center or emergency room at once. NOTE: This medicine is only for you. Do not share this medicine with others. What if I miss a dose? If you miss a dose, take it as soon as you can. If it is almost time for your next dose, take only that dose. Do not take double or extra doses. What may interact with this medicine? Do not take this medicine with any of the following medications: -epoetin alfa This list may not describe all possible interactions. Give your health care provider a list of all the medicines, herbs, non-prescription drugs, or dietary supplements you  use. Also tell them if you smoke, drink alcohol, or use illegal drugs. Some items may interact with your medicine. What should I watch for while using this medicine? Visit your prescriber or health care professional for regular checks on your progress and for the needed blood tests and blood pressure measurements. It is especially important for the  doctor to make sure your hemoglobin level is in the desired range, to limit the risk of potential side effects and to give you the best benefit. Keep all appointments for any recommended tests. Check your blood pressure as directed. Ask your doctor what your blood pressure should be and when you should contact him or her. As your body makes more red blood cells, you may need to take iron, folic acid, or vitamin B supplements. Ask your doctor or health care provider which products are right for you. If you have kidney disease continue dietary restrictions, even though this medication can make you feel better. Talk with your doctor or health care professional about the foods you eat and the vitamins that you take. What side effects may I notice from receiving this medicine? Side effects that you should report to your doctor or health care professional as soon as possible: -allergic reactions like skin rash, itching or hives, swelling of the face, lips, or tongue -breathing problems -changes in vision -chest pain -confusion, trouble speaking or understanding -feeling faint or lightheaded, falls -high blood pressure -muscle aches or pains -pain, swelling, warmth in the leg -rapid weight gain -severe headaches -sudden numbness or weakness of the face, arm or leg -trouble walking, dizziness, loss of balance or coordination -seizures (convulsions) -swelling of the ankles, feet, hands -unusually weak or tired Side effects that usually do not require medical attention (report to your doctor or health care professional if they continue or are bothersome): -diarrhea -fever, chills (flu-like symptoms) -headaches -nausea, vomiting -redness, stinging, or swelling at site where injected This list may not describe all possible side effects. Call your doctor for medical advice about side effects. You may report side effects to FDA at 1-800-FDA-1088. Where should I keep my medicine? Keep out of the reach  of children. Store in a refrigerator between 2 and 8 degrees C (36 and 46 degrees F). Do not freeze. Do not shake. Throw away any unused portion if using a single-dose vial. Throw away any unused medicine after the expiration date. NOTE: This sheet is a summary. It may not cover all possible information. If you have questions about this medicine, talk to your doctor, pharmacist, or health care provider.    2016, [employer] Standard. (2008-05-11 10:23:57)

## 2016-02-27 ENCOUNTER — Other Ambulatory Visit (HOSPITAL_BASED_OUTPATIENT_CLINIC_OR_DEPARTMENT_OTHER): Payer: Medicare Other

## 2016-02-27 ENCOUNTER — Other Ambulatory Visit: Payer: Medicare Other

## 2016-02-27 ENCOUNTER — Ambulatory Visit: Payer: Medicare Other

## 2016-02-27 ENCOUNTER — Ambulatory Visit (HOSPITAL_BASED_OUTPATIENT_CLINIC_OR_DEPARTMENT_OTHER): Payer: Medicare Other

## 2016-02-27 VITALS — BP 148/73 | HR 83 | Temp 97.7°F | Resp 20

## 2016-02-27 DIAGNOSIS — D4621 Refractory anemia with excess of blasts 1: Secondary | ICD-10-CM

## 2016-02-27 DIAGNOSIS — D4620 Refractory anemia with excess of blasts, unspecified: Secondary | ICD-10-CM

## 2016-02-27 DIAGNOSIS — Z5111 Encounter for antineoplastic chemotherapy: Secondary | ICD-10-CM

## 2016-02-27 LAB — CMP (CANCER CENTER ONLY)
ALT: 20 U/L (ref 10–47)
AST: 24 U/L (ref 11–38)
Albumin: 3.5 g/dL (ref 3.3–5.5)
Alkaline Phosphatase: 46 U/L (ref 26–84)
BUN: 16 mg/dL (ref 7–22)
CHLORIDE: 106 meq/L (ref 98–108)
CO2: 28 mEq/L (ref 18–33)
CREATININE: 1.5 mg/dL — AB (ref 0.6–1.2)
Calcium: 8.9 mg/dL (ref 8.0–10.3)
GLUCOSE: 112 mg/dL (ref 73–118)
Potassium: 4.1 mEq/L (ref 3.3–4.7)
SODIUM: 137 meq/L (ref 128–145)
TOTAL PROTEIN: 7.2 g/dL (ref 6.4–8.1)
Total Bilirubin: 0.9 mg/dl (ref 0.20–1.60)

## 2016-02-27 LAB — FERRITIN: Ferritin: 2524 ng/ml — ABNORMAL HIGH (ref 22–316)

## 2016-02-27 LAB — CBC WITH DIFFERENTIAL (CANCER CENTER ONLY)
BASO#: 0 10*3/uL (ref 0.0–0.2)
BASO%: 1.7 % (ref 0.0–2.0)
EOS%: 0 % (ref 0.0–7.0)
Eosinophils Absolute: 0 10*3/uL (ref 0.0–0.5)
HCT: 32.3 % — ABNORMAL LOW (ref 38.7–49.9)
HGB: 10.4 g/dL — ABNORMAL LOW (ref 13.0–17.1)
LYMPH#: 0.2 10*3/uL — ABNORMAL LOW (ref 0.9–3.3)
LYMPH%: 11.2 % — AB (ref 14.0–48.0)
MCH: 34.2 pg — AB (ref 28.0–33.4)
MCHC: 32.2 g/dL (ref 32.0–35.9)
MCV: 106 fL — ABNORMAL HIGH (ref 82–98)
MONO#: 0.1 10*3/uL (ref 0.1–0.9)
MONO%: 5.6 % (ref 0.0–13.0)
NEUT#: 1.5 10*3/uL (ref 1.5–6.5)
NEUT%: 81.5 % — ABNORMAL HIGH (ref 40.0–80.0)
PLATELETS: 473 10*3/uL — AB (ref 145–400)
RBC: 3.04 10*6/uL — AB (ref 4.20–5.70)
RDW: 22 % — AB (ref 11.1–15.7)
WBC: 1.8 10*3/uL — AB (ref 4.0–10.0)

## 2016-02-27 LAB — IRON AND TIBC
%SAT: 33 % (ref 20–55)
IRON: 88 ug/dL (ref 42–163)
TIBC: 265 ug/dL (ref 202–409)
UIBC: 177 ug/dL (ref 117–376)

## 2016-02-27 LAB — TECHNOLOGIST REVIEW CHCC SATELLITE

## 2016-02-27 MED ORDER — SODIUM CHLORIDE 0.9% FLUSH
10.0000 mL | INTRAVENOUS | Status: DC | PRN
  Administered 2016-02-27: 10 mL
  Filled 2016-02-27: qty 10

## 2016-02-27 MED ORDER — PROCHLORPERAZINE MALEATE 10 MG PO TABS
ORAL_TABLET | ORAL | Status: AC
  Filled 2016-02-27: qty 1

## 2016-02-27 MED ORDER — SODIUM CHLORIDE 0.9 % IV SOLN
20.0000 mg/m2 | Freq: Once | INTRAVENOUS | Status: AC
  Administered 2016-02-27: 45 mg via INTRAVENOUS
  Filled 2016-02-27: qty 9

## 2016-02-27 MED ORDER — PROCHLORPERAZINE MALEATE 10 MG PO TABS
10.0000 mg | ORAL_TABLET | Freq: Once | ORAL | Status: AC
  Administered 2016-02-27: 10 mg via ORAL

## 2016-02-27 MED ORDER — SODIUM CHLORIDE 0.9 % IV SOLN
Freq: Once | INTRAVENOUS | Status: AC
  Administered 2016-02-27: 11:00:00 via INTRAVENOUS

## 2016-02-27 MED ORDER — HEPARIN SOD (PORK) LOCK FLUSH 100 UNIT/ML IV SOLN
500.0000 [IU] | Freq: Once | INTRAVENOUS | Status: AC | PRN
  Administered 2016-02-27: 500 [IU]
  Filled 2016-02-27: qty 5

## 2016-02-28 ENCOUNTER — Ambulatory Visit: Payer: Medicare Other

## 2016-02-28 ENCOUNTER — Ambulatory Visit (HOSPITAL_BASED_OUTPATIENT_CLINIC_OR_DEPARTMENT_OTHER): Payer: Medicare Other

## 2016-02-28 VITALS — BP 130/62 | HR 86 | Temp 98.0°F | Resp 18

## 2016-02-28 DIAGNOSIS — Z5111 Encounter for antineoplastic chemotherapy: Secondary | ICD-10-CM

## 2016-02-28 DIAGNOSIS — D4621 Refractory anemia with excess of blasts 1: Secondary | ICD-10-CM | POA: Diagnosis not present

## 2016-02-28 DIAGNOSIS — D4620 Refractory anemia with excess of blasts, unspecified: Secondary | ICD-10-CM

## 2016-02-28 MED ORDER — SODIUM CHLORIDE 0.9 % IV SOLN
Freq: Once | INTRAVENOUS | Status: AC
  Administered 2016-02-28: 09:00:00 via INTRAVENOUS

## 2016-02-28 MED ORDER — SODIUM CHLORIDE 0.9% FLUSH
10.0000 mL | INTRAVENOUS | Status: DC | PRN
  Administered 2016-02-28: 10 mL
  Filled 2016-02-28: qty 10

## 2016-02-28 MED ORDER — PROCHLORPERAZINE MALEATE 10 MG PO TABS
10.0000 mg | ORAL_TABLET | Freq: Once | ORAL | Status: AC
  Administered 2016-02-28: 10 mg via ORAL

## 2016-02-28 MED ORDER — PROCHLORPERAZINE MALEATE 10 MG PO TABS
ORAL_TABLET | ORAL | Status: AC
  Filled 2016-02-28: qty 1

## 2016-02-28 MED ORDER — SODIUM CHLORIDE 0.9 % IV SOLN
20.0000 mg/m2 | Freq: Once | INTRAVENOUS | Status: AC
  Administered 2016-02-28: 45 mg via INTRAVENOUS
  Filled 2016-02-28: qty 9

## 2016-02-28 MED ORDER — HEPARIN SOD (PORK) LOCK FLUSH 100 UNIT/ML IV SOLN
500.0000 [IU] | Freq: Once | INTRAVENOUS | Status: AC | PRN
  Administered 2016-02-28: 500 [IU]
  Filled 2016-02-28: qty 5

## 2016-02-28 NOTE — Patient Instructions (Signed)
[facility] Discharge Instructions for Patients Receiving Chemotherapy  Today you received the following chemotherapy agents Decitabine  To help prevent nausea and vomiting after your treatment, we encourage you to take your nausea medication    If you develop nausea and vomiting that is not controlled by your nausea medication, call the clinic.   BELOW ARE SYMPTOMS THAT SHOULD BE REPORTED IMMEDIATELY:  *FEVER GREATER THAN 100.5 F  *CHILLS WITH OR WITHOUT FEVER  NAUSEA AND VOMITING THAT IS NOT CONTROLLED WITH YOUR NAUSEA MEDICATION  *UNUSUAL SHORTNESS OF BREATH  *UNUSUAL BRUISING OR BLEEDING  TENDERNESS IN MOUTH AND THROAT WITH OR WITHOUT PRESENCE OF ULCERS  *URINARY PROBLEMS  *BOWEL PROBLEMS  UNUSUAL RASH Items with * indicate a potential emergency and should be followed up as soon as possible.  Feel free to call the clinic you have any questions or concerns. The clinic phone number is [phone].  Please show the CHEMO ALERT CARD at check-in to the Emergency Department and triage nurse.  Darbepoetin Alfa injection What is this medicine? DARBEPOETIN ALFA (dar be POE e tin AL fa) helps your body make more red blood cells. It is used to treat anemia caused by chronic kidney failure and chemotherapy. This medicine may be used for other purposes; ask your health care provider or pharmacist if you have questions. What should I tell my health care provider before I take this medicine? They need to know if you have any of these conditions: -blood clotting disorders or history of blood clots -cancer patient not on chemotherapy -cystic fibrosis -heart disease, such as angina, heart failure, or a history of a heart attack -hemoglobin level of 12 g/dL or greater -high blood pressure -low levels of folate, iron, or vitamin B12 -seizures -an unusual or allergic reaction to darbepoetin, erythropoietin, albumin, hamster proteins, latex, other medicines, foods,  dyes, or preservatives -pregnant or trying to get pregnant -breast-feeding How should I use this medicine? This medicine is for injection into a vein or under the skin. It is usually given by a health care professional in a hospital or clinic setting. If you get this medicine at home, you will be taught how to prepare and give this medicine. Do not shake the solution before you withdraw a dose. Use exactly as directed. Take your medicine at regular intervals. Do not take your medicine more often than directed. It is important that you put your used needles and syringes in a special sharps container. Do not put them in a trash can. If you do not have a sharps container, call your pharmacist or healthcare provider to get one. Talk to your pediatrician regarding the use of this medicine in children. While this medicine may be used in children as young as 1 year for selected conditions, precautions do apply. Overdosage: If you think you have taken too much of this medicine contact a poison control center or emergency room at once. NOTE: This medicine is only for you. Do not share this medicine with others. What if I miss a dose? If you miss a dose, take it as soon as you can. If it is almost time for your next dose, take only that dose. Do not take double or extra doses. What may interact with this medicine? Do not take this medicine with any of the following medications: -epoetin alfa This list may not describe all possible interactions. Give your health care provider a list of all the medicines, herbs, non-prescription drugs, or dietary supplements you   use. Also tell them if you smoke, drink alcohol, or use illegal drugs. Some items may interact with your medicine. What should I watch for while using this medicine? Visit your prescriber or health care professional for regular checks on your progress and for the needed blood tests and blood pressure measurements. It is especially important for the  doctor to make sure your hemoglobin level is in the desired range, to limit the risk of potential side effects and to give you the best benefit. Keep all appointments for any recommended tests. Check your blood pressure as directed. Ask your doctor what your blood pressure should be and when you should contact him or her. As your body makes more red blood cells, you may need to take iron, folic acid, or vitamin B supplements. Ask your doctor or health care provider which products are right for you. If you have kidney disease continue dietary restrictions, even though this medication can make you feel better. Talk with your doctor or health care professional about the foods you eat and the vitamins that you take. What side effects may I notice from receiving this medicine? Side effects that you should report to your doctor or health care professional as soon as possible: -allergic reactions like skin rash, itching or hives, swelling of the face, lips, or tongue -breathing problems -changes in vision -chest pain -confusion, trouble speaking or understanding -feeling faint or lightheaded, falls -high blood pressure -muscle aches or pains -pain, swelling, warmth in the leg -rapid weight gain -severe headaches -sudden numbness or weakness of the face, arm or leg -trouble walking, dizziness, loss of balance or coordination -seizures (convulsions) -swelling of the ankles, feet, hands -unusually weak or tired Side effects that usually do not require medical attention (report to your doctor or health care professional if they continue or are bothersome): -diarrhea -fever, chills (flu-like symptoms) -headaches -nausea, vomiting -redness, stinging, or swelling at site where injected This list may not describe all possible side effects. Call your doctor for medical advice about side effects. You may report side effects to FDA at 1-800-FDA-1088. Where should I keep my medicine? Keep out of the reach  of children. Store in a refrigerator between 2 and 8 degrees C (36 and 46 degrees F). Do not freeze. Do not shake. Throw away any unused portion if using a single-dose vial. Throw away any unused medicine after the expiration date. NOTE: This sheet is a summary. It may not cover all possible information. If you have questions about this medicine, talk to your doctor, pharmacist, or health care provider.    2016, [employer]. (2008-05-11 10:23:57)

## 2016-03-02 ENCOUNTER — Other Ambulatory Visit: Payer: Self-pay | Admitting: *Deleted

## 2016-03-02 DIAGNOSIS — D4620 Refractory anemia with excess of blasts, unspecified: Secondary | ICD-10-CM

## 2016-03-05 ENCOUNTER — Other Ambulatory Visit (HOSPITAL_BASED_OUTPATIENT_CLINIC_OR_DEPARTMENT_OTHER): Payer: Medicare Other

## 2016-03-05 ENCOUNTER — Ambulatory Visit (HOSPITAL_BASED_OUTPATIENT_CLINIC_OR_DEPARTMENT_OTHER): Payer: Medicare Other

## 2016-03-05 VITALS — BP 142/66 | HR 99

## 2016-03-05 DIAGNOSIS — D4621 Refractory anemia with excess of blasts 1: Secondary | ICD-10-CM

## 2016-03-05 DIAGNOSIS — D4620 Refractory anemia with excess of blasts, unspecified: Secondary | ICD-10-CM

## 2016-03-05 DIAGNOSIS — Z452 Encounter for adjustment and management of vascular access device: Secondary | ICD-10-CM

## 2016-03-05 LAB — CBC WITH DIFFERENTIAL (CANCER CENTER ONLY)
BASO#: 0 10*3/uL (ref 0.0–0.2)
BASO%: 2.2 % — ABNORMAL HIGH (ref 0.0–2.0)
EOS ABS: 0 10*3/uL (ref 0.0–0.5)
EOS%: 1.1 % (ref 0.0–7.0)
HEMATOCRIT: 33 % — AB (ref 38.7–49.9)
HEMOGLOBIN: 10.9 g/dL — AB (ref 13.0–17.1)
LYMPH#: 0.6 10*3/uL — AB (ref 0.9–3.3)
LYMPH%: 34.4 % (ref 14.0–48.0)
MCH: 34.8 pg — AB (ref 28.0–33.4)
MCHC: 33 g/dL (ref 32.0–35.9)
MCV: 105 fL — ABNORMAL HIGH (ref 82–98)
MONO#: 0.1 10*3/uL (ref 0.1–0.9)
MONO%: 6.1 % (ref 0.0–13.0)
NEUT%: 56.2 % (ref 40.0–80.0)
NEUTROS ABS: 1 10*3/uL — AB (ref 1.5–6.5)
Platelets: 291 10*3/uL (ref 145–400)
RBC: 3.13 10*6/uL — ABNORMAL LOW (ref 4.20–5.70)
RDW: 21.7 % — ABNORMAL HIGH (ref 11.1–15.7)
WBC: 1.8 10*3/uL — ABNORMAL LOW (ref 4.0–10.0)

## 2016-03-05 LAB — COMPREHENSIVE METABOLIC PANEL
ALT: 18 U/L (ref 0–55)
AST: 16 U/L (ref 5–34)
Albumin: 3.4 g/dL — ABNORMAL LOW (ref 3.5–5.0)
Alkaline Phosphatase: 55 U/L (ref 40–150)
Anion Gap: 11 mEq/L (ref 3–11)
BILIRUBIN TOTAL: 0.4 mg/dL (ref 0.20–1.20)
BUN: 25.4 mg/dL (ref 7.0–26.0)
CHLORIDE: 102 meq/L (ref 98–109)
CO2: 24 meq/L (ref 22–29)
Calcium: 9.4 mg/dL (ref 8.4–10.4)
Creatinine: 1.5 mg/dL — ABNORMAL HIGH (ref 0.7–1.3)
EGFR: 46 mL/min/{1.73_m2} — AB (ref >90)
GLUCOSE: 97 mg/dL (ref 70–140)
Potassium: 4.3 mEq/L (ref 3.5–5.1)
Sodium: 138 mEq/L (ref 136–145)
TOTAL PROTEIN: 7.3 g/dL (ref 6.4–8.3)

## 2016-03-05 LAB — TECHNOLOGIST REVIEW CHCC SATELLITE

## 2016-03-05 MED ORDER — HEPARIN SOD (PORK) LOCK FLUSH 100 UNIT/ML IV SOLN
500.0000 [IU] | Freq: Once | INTRAVENOUS | Status: AC
  Administered 2016-03-05: 500 [IU] via INTRAVENOUS
  Filled 2016-03-05: qty 5

## 2016-03-05 MED ORDER — SODIUM CHLORIDE 0.9% FLUSH
10.0000 mL | INTRAVENOUS | Status: DC | PRN
  Administered 2016-03-05: 10 mL via INTRAVENOUS
  Filled 2016-03-05: qty 10

## 2016-03-05 NOTE — Progress Notes (Signed)
Anthony [name] presented for Portacath access and flush. Proper placement of portacath confirmed by CXR. Portacath located in the right chest wall accessed with  H 20 needle. Clean, Dry and Intact Good blood return present. Portacath flushed with 79ml NS and 500U/87ml Heparin per protocol and needle removed intact. Procedure without incident. Patient tolerated procedure well.

## 2016-03-06 MED FILL — *JADENU 360 MG TABLET: 360 | 30 days supply | Qty: 90 | Fill #1

## 2016-03-09 ENCOUNTER — Other Ambulatory Visit: Payer: Self-pay | Admitting: *Deleted

## 2016-03-09 DIAGNOSIS — D4620 Refractory anemia with excess of blasts, unspecified: Secondary | ICD-10-CM

## 2016-03-09 DIAGNOSIS — D509 Iron deficiency anemia, unspecified: Secondary | ICD-10-CM

## 2016-03-12 ENCOUNTER — Ambulatory Visit (HOSPITAL_BASED_OUTPATIENT_CLINIC_OR_DEPARTMENT_OTHER): Payer: Medicare Other

## 2016-03-12 ENCOUNTER — Other Ambulatory Visit (HOSPITAL_BASED_OUTPATIENT_CLINIC_OR_DEPARTMENT_OTHER): Payer: Medicare Other

## 2016-03-12 VITALS — BP 113/66 | HR 102 | Temp 97.6°F | Resp 18

## 2016-03-12 DIAGNOSIS — D4621 Refractory anemia with excess of blasts 1: Secondary | ICD-10-CM

## 2016-03-12 DIAGNOSIS — Z452 Encounter for adjustment and management of vascular access device: Secondary | ICD-10-CM | POA: Diagnosis not present

## 2016-03-12 DIAGNOSIS — D4620 Refractory anemia with excess of blasts, unspecified: Secondary | ICD-10-CM

## 2016-03-12 DIAGNOSIS — D509 Iron deficiency anemia, unspecified: Secondary | ICD-10-CM

## 2016-03-12 LAB — CBC WITH DIFFERENTIAL (CANCER CENTER ONLY)
BASO#: 0.1 10*3/uL (ref 0.0–0.2)
BASO%: 6.1 % — AB (ref 0.0–2.0)
EOS%: 2.3 % (ref 0.0–7.0)
Eosinophils Absolute: 0 10*3/uL (ref 0.0–0.5)
HCT: 32.8 % — ABNORMAL LOW (ref 38.7–49.9)
HGB: 11 g/dL — ABNORMAL LOW (ref 13.0–17.1)
LYMPH#: 0.5 10*3/uL — ABNORMAL LOW (ref 0.9–3.3)
LYMPH%: 35.6 % (ref 14.0–48.0)
MCH: 35.4 pg — AB (ref 28.0–33.4)
MCHC: 33.5 g/dL (ref 32.0–35.9)
MCV: 106 fL — AB (ref 82–98)
MONO#: 0.2 10*3/uL (ref 0.1–0.9)
MONO%: 14.4 % — ABNORMAL HIGH (ref 0.0–13.0)
NEUT#: 0.6 10*3/uL — ABNORMAL LOW (ref 1.5–6.5)
NEUT%: 41.6 % (ref 40.0–80.0)
PLATELETS: 145 10*3/uL (ref 145–400)
RBC: 3.11 10*6/uL — ABNORMAL LOW (ref 4.20–5.70)
RDW: 20.8 % — AB (ref 11.1–15.7)
WBC: 1.3 10*3/uL — ABNORMAL LOW (ref 4.0–10.0)

## 2016-03-12 LAB — CMP (CANCER CENTER ONLY)
ALBUMIN: 3.1 g/dL — AB (ref 3.3–5.5)
ALK PHOS: 64 U/L (ref 26–84)
ALT: 17 U/L (ref 10–47)
AST: 21 U/L (ref 11–38)
BUN, Bld: 15 mg/dL (ref 7–22)
CO2: 22 mEq/L (ref 18–33)
CREATININE: 1.5 mg/dL — AB (ref 0.6–1.2)
Calcium: 9 mg/dL (ref 8.0–10.3)
Chloride: 102 mEq/L (ref 98–108)
Glucose, Bld: 109 mg/dL (ref 73–118)
POTASSIUM: 3.5 meq/L (ref 3.3–4.7)
SODIUM: 134 meq/L (ref 128–145)
TOTAL PROTEIN: 7 g/dL (ref 6.4–8.1)
Total Bilirubin: 0.7 mg/dl (ref 0.20–1.60)

## 2016-03-12 LAB — TECHNOLOGIST REVIEW CHCC SATELLITE

## 2016-03-12 MED ORDER — HEPARIN SOD (PORK) LOCK FLUSH 100 UNIT/ML IV SOLN
500.0000 [IU] | Freq: Once | INTRAVENOUS | Status: AC
  Administered 2016-03-12: 500 [IU] via INTRAVENOUS
  Filled 2016-03-12: qty 5

## 2016-03-12 MED ORDER — SODIUM CHLORIDE 0.9% FLUSH
10.0000 mL | INTRAVENOUS | Status: DC | PRN
  Administered 2016-03-12: 10 mL via INTRAVENOUS
  Filled 2016-03-12: qty 10

## 2016-03-12 NOTE — Progress Notes (Signed)
Anthony [name] presented for Portacath access and flush. Proper placement of portacath confirmed by CXR. Portacath located in the right chest wall accessed with  H 20 needle. Clean, Dry and Intact Good blood return present. Portacath flushed with 59ml NS and 500U/85ml Heparin per protocol and needle removed intact. Procedure without incident. Patient tolerated procedure well.

## 2016-03-12 NOTE — Patient Instructions (Signed)

## 2016-03-15 ENCOUNTER — Other Ambulatory Visit: Payer: Self-pay | Admitting: Hematology & Oncology

## 2016-03-16 ENCOUNTER — Other Ambulatory Visit: Payer: Self-pay | Admitting: *Deleted

## 2016-03-16 DIAGNOSIS — D4620 Refractory anemia with excess of blasts, unspecified: Secondary | ICD-10-CM

## 2016-03-19 ENCOUNTER — Other Ambulatory Visit: Payer: Self-pay | Admitting: Family

## 2016-03-19 ENCOUNTER — Ambulatory Visit (HOSPITAL_BASED_OUTPATIENT_CLINIC_OR_DEPARTMENT_OTHER): Payer: Medicare Other

## 2016-03-19 ENCOUNTER — Other Ambulatory Visit (HOSPITAL_BASED_OUTPATIENT_CLINIC_OR_DEPARTMENT_OTHER): Payer: Medicare Other

## 2016-03-19 VITALS — BP 133/68 | HR 101 | Temp 97.7°F | Resp 18

## 2016-03-19 DIAGNOSIS — D4620 Refractory anemia with excess of blasts, unspecified: Secondary | ICD-10-CM

## 2016-03-19 DIAGNOSIS — D4621 Refractory anemia with excess of blasts 1: Secondary | ICD-10-CM | POA: Diagnosis not present

## 2016-03-19 DIAGNOSIS — R110 Nausea: Secondary | ICD-10-CM

## 2016-03-19 DIAGNOSIS — F419 Anxiety disorder, unspecified: Secondary | ICD-10-CM

## 2016-03-19 DIAGNOSIS — Z452 Encounter for adjustment and management of vascular access device: Secondary | ICD-10-CM

## 2016-03-19 LAB — COMPREHENSIVE METABOLIC PANEL
ALBUMIN: 3 g/dL — AB (ref 3.5–5.0)
ALK PHOS: 53 U/L (ref 40–150)
ALT: 13 U/L (ref 0–55)
ANION GAP: 11 meq/L (ref 3–11)
AST: 14 U/L (ref 5–34)
BUN: 17.6 mg/dL (ref 7.0–26.0)
CALCIUM: 9 mg/dL (ref 8.4–10.4)
CO2: 23 mEq/L (ref 22–29)
Chloride: 107 mEq/L (ref 98–109)
Creatinine: 1.5 mg/dL — ABNORMAL HIGH (ref 0.7–1.3)
EGFR: 43 mL/min/{1.73_m2} — AB (ref >90)
Glucose: 115 mg/dl (ref 70–140)
POTASSIUM: 3.8 meq/L (ref 3.5–5.1)
Sodium: 140 mEq/L (ref 136–145)
Total Bilirubin: 0.3 mg/dL (ref 0.20–1.20)
Total Protein: 6.7 g/dL (ref 6.4–8.3)

## 2016-03-19 LAB — MANUAL DIFFERENTIAL (CHCC SATELLITE)
ALC: 0.4 10*3/uL — ABNORMAL LOW (ref 0.9–3.3)
ANC (CHCC MAN DIFF): 0.7 10*3/uL — AB (ref 1.5–6.5)
BAND NEUTROPHILS: 1 % (ref 0–10)
BASO: 5 % — ABNORMAL HIGH (ref 0–2)
Eos: 2 % (ref 0–7)
LYMPH: 33 % (ref 14–48)
MONO: 3 % (ref 0–13)
PLT EST ~~LOC~~: ADEQUATE
SEG: 56 % (ref 40–75)

## 2016-03-19 LAB — CBC WITH DIFFERENTIAL (CANCER CENTER ONLY)
HCT: 33.5 % — ABNORMAL LOW (ref 38.7–49.9)
HEMOGLOBIN: 10.8 g/dL — AB (ref 13.0–17.1)
MCH: 34.4 pg — AB (ref 28.0–33.4)
MCHC: 32.2 g/dL (ref 32.0–35.9)
MCV: 107 fL — AB (ref 82–98)
Platelets: 326 10*3/uL (ref 145–400)
RBC: 3.14 10*6/uL — ABNORMAL LOW (ref 4.20–5.70)
RDW: 19 % — ABNORMAL HIGH (ref 11.1–15.7)
WBC: 1.2 10*3/uL — ABNORMAL LOW (ref 4.0–10.0)

## 2016-03-19 MED ORDER — LORAZEPAM 0.5 MG PO TABS: 0.5000 mg | ORAL_TABLET | Freq: Four times a day (QID) | ORAL | 0 refills | Status: DC | PRN

## 2016-03-19 MED ORDER — HEPARIN SOD (PORK) LOCK FLUSH 100 UNIT/ML IV SOLN
500.0000 [IU] | Freq: Once | INTRAVENOUS | Status: AC
  Administered 2016-03-19: 500 [IU] via INTRAVENOUS
  Filled 2016-03-19: qty 5

## 2016-03-19 MED ORDER — SODIUM CHLORIDE 0.9% FLUSH
10.0000 mL | INTRAVENOUS | Status: DC | PRN
  Administered 2016-03-19: 10 mL via INTRAVENOUS
  Filled 2016-03-19: qty 10

## 2016-03-19 MED FILL — LORazepam 0.5 MG TABS: 0.5 | 8 days supply | Qty: 30 | Fill #0

## 2016-03-19 MED FILL — predniSONE 20 MG TABS: 20 | 30 days supply | Qty: 30 | Fill #5

## 2016-03-19 NOTE — Progress Notes (Signed)
Anthony [name] presented for Portacath access and flush. Proper placement of portacath confirmed by CXR. Portacath located in the right chest wall accessed with  H 20 needle. Clean, Dry and Intact Good blood return present. Portacath flushed with 52ml NS and 500U/25ml Heparin per protocol and needle removed intact. Procedure without incident. Patient tolerated procedure well.

## 2016-03-19 NOTE — Patient Instructions (Signed)

## 2016-03-26 ENCOUNTER — Ambulatory Visit: Payer: Medicare Other

## 2016-03-26 ENCOUNTER — Other Ambulatory Visit: Payer: Medicare Other

## 2016-03-26 ENCOUNTER — Ambulatory Visit: Payer: Medicare Other | Admitting: Hematology & Oncology

## 2016-03-26 ENCOUNTER — Other Ambulatory Visit: Payer: Self-pay | Admitting: Hematology & Oncology

## 2016-03-26 DIAGNOSIS — D46Z Other myelodysplastic syndromes: Secondary | ICD-10-CM

## 2016-03-27 ENCOUNTER — Ambulatory Visit: Payer: Medicare Other

## 2016-03-27 ENCOUNTER — Ambulatory Visit (HOSPITAL_COMMUNITY)
Admission: RE | Admit: 2016-03-27 | Discharge: 2016-03-27 | Disposition: A | Discharge status: Home / Self Care | Payer: Medicare Other | Source: Ambulatory Visit | Attending: Hematology & Oncology | Admitting: Hematology & Oncology

## 2016-03-27 ENCOUNTER — Encounter (HOSPITAL_COMMUNITY): Payer: Self-pay

## 2016-03-27 DIAGNOSIS — D539 Nutritional anemia, unspecified: Secondary | ICD-10-CM | POA: Diagnosis not present

## 2016-03-27 DIAGNOSIS — D46Z Other myelodysplastic syndromes: Secondary | ICD-10-CM

## 2016-03-27 DIAGNOSIS — D4621 Refractory anemia with excess of blasts 1: Secondary | ICD-10-CM

## 2016-03-27 DIAGNOSIS — D469 Myelodysplastic syndrome, unspecified: Secondary | ICD-10-CM | POA: Diagnosis present

## 2016-03-27 DIAGNOSIS — D72819 Decreased white blood cell count, unspecified: Secondary | ICD-10-CM | POA: Insufficient documentation

## 2016-03-27 DIAGNOSIS — D7589 Other specified diseases of blood and blood-forming organs: Secondary | ICD-10-CM | POA: Diagnosis not present

## 2016-03-27 LAB — CBC WITH DIFFERENTIAL/PLATELET
BASOS ABS: 0.1 10*3/uL (ref 0.0–0.1)
Basophils Relative: 4 %
EOS PCT: 1 %
Eosinophils Absolute: 0 10*3/uL (ref 0.0–0.7)
HEMATOCRIT: 33.7 % — AB (ref 39.0–52.0)
HEMOGLOBIN: 10.6 g/dL — AB (ref 13.0–17.0)
LYMPHS ABS: 0.8 10*3/uL (ref 0.7–4.0)
Lymphocytes Relative: 25 %
MCH: 32.5 pg (ref 26.0–34.0)
MCHC: 31.5 g/dL (ref 30.0–36.0)
MCV: 103.4 fL — ABNORMAL HIGH (ref 78.0–100.0)
MONO ABS: 0.7 10*3/uL (ref 0.1–1.0)
MONOS PCT: 22 %
NEUTROS ABS: 1.5 10*3/uL — AB (ref 1.7–7.7)
Neutrophils Relative %: 48 %
Platelets: 601 10*3/uL — ABNORMAL HIGH (ref 150–400)
RBC: 3.26 MIL/uL — ABNORMAL LOW (ref 4.22–5.81)
RDW: 18.3 % — AB (ref 11.5–15.5)
WBC: 3.1 10*3/uL — ABNORMAL LOW (ref 4.0–10.5)

## 2016-03-27 LAB — BONE MARROW EXAM

## 2016-03-27 MED ORDER — MIDAZOLAM HCL 5 MG/ML IJ SOLN
10.0000 mg | Freq: Once | INTRAMUSCULAR | Status: DC
  Filled 2016-03-27: qty 2

## 2016-03-27 MED ORDER — MEPERIDINE HCL 25 MG/ML IJ SOLN
INTRAMUSCULAR | Status: AC | PRN
  Administered 2016-03-27 (×2): 12.5 mg via INTRAVENOUS

## 2016-03-27 MED ORDER — SODIUM CHLORIDE 0.9% FLUSH
10.0000 mL | INTRAVENOUS | Status: AC | PRN
  Administered 2016-03-27: 10 mL

## 2016-03-27 MED ORDER — HEPARIN SOD (PORK) LOCK FLUSH 100 UNIT/ML IV SOLN: 500.0000 [IU] | INTRAVENOUS | Status: DC

## 2016-03-27 MED ORDER — SODIUM CHLORIDE 0.9 % IV SOLN
INTRAVENOUS | Status: DC
  Administered 2016-03-27: 08:00:00 via INTRAVENOUS

## 2016-03-27 MED ORDER — HEPARIN SOD (PORK) LOCK FLUSH 100 UNIT/ML IV SOLN
500.0000 [IU] | INTRAVENOUS | Status: DC | PRN
  Administered 2016-03-27: 500 [IU]
  Filled 2016-03-27: qty 5

## 2016-03-27 MED ORDER — MIDAZOLAM HCL 5 MG/5ML IJ SOLN
INTRAMUSCULAR | Status: AC | PRN
  Administered 2016-03-27: 2.5 mg via INTRAVENOUS

## 2016-03-27 MED ORDER — MEPERIDINE HCL 50 MG/ML IJ SOLN
50.0000 mg | Freq: Once | INTRAMUSCULAR | Status: DC
  Filled 2016-03-27: qty 1

## 2016-03-27 NOTE — Sedation Documentation (Signed)
Dressing CDI ambulated to BR with assist and sat to void

## 2016-03-27 NOTE — Procedures (Signed)
This is a procedure note for [name]. This is a bone marrow biopsy and aspirate that was done at [facility] long short stay.  He  as had his Port-A-Cath accessed.  We did the appropriate timeout procedure at 7:50 AM.  His Mallimpati score is 2. His ASA class is 2.  He was placed onto his right side. He received a total of 2.5 mg of Versed and 25 mg of Demerol IV for sedation.  The left posterior like crest was prepped and draped in sterile fashion. 5 mL of 1% lidocaine was infiltrate under the skin down to the periosteum.  Scalpel was used to make an incision. With the combination biopsy and aspirate needle, we obtained to bone marrow aspirates without difficulty.  We then obtained a bone marrow biopsy core. It was of adequate size.  He tolerated the procedure well. We then cleaned and dressed the procedure site sterilely.  There were no complications.  I got his wife. I talked to his wife and brought her back to the procedure room.  As always, I got excellent help from Butch Penny the bone marrow tech and Marcie Bal the nurse.  Lattie Haw, MD

## 2016-03-27 NOTE — Discharge Instructions (Signed)
Do not drive  For 24 hours °Do not go into public places today °May resume your regular diet and take home medications as usual °May experience small amount of tingling in leg (biopsy side) °May take shower and remove bandage in am °For any questions or concerns, call Dr Ennever °If bleeding occurs at site, hold pressure x10 minutes  If continues, call Dr Ennever °Bone Marrow Aspiration and Bone Marrow Biopsy, Care After °Refer to this sheet in the next few weeks. These instructions provide you with information about caring for yourself after your procedure. Your health care provider may also give you more specific instructions. Your treatment has been planned according to current medical practices, but problems sometimes occur. Call your health care provider if you have any problems or questions after your procedure. °WHAT TO EXPECT AFTER THE PROCEDURE °After your procedure, it is common to have: °· Soreness or tenderness around the puncture site. °· Bruising. °HOME CARE INSTRUCTIONS °· Take medicines only as directed by your health care provider. °· Follow your health care provider's instructions about: °¨ Puncture site care. °¨ Bandage (dressing) changes and removal. °· Bathe and shower as directed by your health care provider. °· Check your puncture site every day for signs of infection. Watch for: °¨ Redness, swelling, or pain. °¨ Fluid, blood, or pus. °· Return to your normal activities as directed by your health care provider. °· Keep all follow-up visits as directed by your health care provider. This is important. °SEEK MEDICAL CARE IF: °· You have a fever. °· You have uncontrollable bleeding. °· You have redness, swelling, or pain at the site of your puncture. °· You have fluid, blood, or pus coming from your puncture site. °  °This information is not intended to replace advice given to you by your health care provider. Make sure you discuss any questions you have with your health care provider. °    °Document Released: 12/15/2004 Document Revised: 10/12/2014 Document Reviewed: 05/19/2014 °Elsevier Interactive Patient Education ©2016 [employer] °Moderate Conscious Sedation, Adult, Care After °Refer to this sheet in the next few weeks. These instructions provide you with information on caring for yourself after your procedure. Your health care provider may also give you more specific instructions. Your treatment has been planned according to current medical practices, but problems sometimes occur. Call your health care provider if you have any problems or questions after your procedure. °WHAT TO EXPECT AFTER THE PROCEDURE  °After your procedure: °· You may feel sleepy, clumsy, and have poor balance for several hours. °· Vomiting may occur if you eat too soon after the procedure. °HOME CARE INSTRUCTIONS °· Do not participate in any activities where you could become injured for at least 24 hours. Do not: °¨ Drive. °¨ Swim. °¨ Ride a bicycle. °¨ Operate heavy machinery. °¨ Cook. °¨ Use power tools. °¨ Climb ladders. °¨ Work from a high place. °· Do not make important decisions or sign legal documents until you are improved. °· If you vomit, drink water, juice, or soup when you can drink without vomiting. Make sure you have little or no nausea before eating solid foods. °· Only take over-the-counter or prescription medicines for pain, discomfort, or fever as directed by your health care provider. °· Make sure you and your family fully understand everything about the medicines given to you, including what side effects may occur. °· You should not drink alcohol, take sleeping pills, or take medicines that cause drowsiness for at least 24 hours. °·   If you smoke, do not smoke without supervision. °· If you are feeling better, you may resume normal activities 24 hours after you were sedated. °· Keep all appointments with your health care provider. °SEEK MEDICAL CARE IF: °· Your skin is pale or bluish in color. °· You  continue to feel nauseous or vomit. °· Your pain is getting worse and is not helped by medicine. °· You have bleeding or swelling. °· You are still sleepy or feeling clumsy after 24 hours. °SEEK IMMEDIATE MEDICAL CARE IF: °· You develop a rash. °· You have difficulty breathing. °· You develop any type of allergic problem. °· You have a fever. °MAKE SURE YOU: °· Understand these instructions. °· Will watch your condition. °· Will get help right away if you are not doing well or get worse. °  °This information is not intended to replace advice given to you by your health care provider. Make sure you discuss any questions you have with your health care provider. °  °Document Released: 03/18/2013 Document Revised: 06/18/2014 Document Reviewed: 03/18/2013 °Elsevier Interactive Patient Education ©2016 Else[employer] Inc. °. ° °

## 2016-03-27 NOTE — Sedation Documentation (Signed)
Patient is resting comfortably. 

## 2016-03-27 NOTE — Sedation Documentation (Signed)
Family updated as to patient's status.

## 2016-03-27 NOTE — Sedation Documentation (Signed)
Dressing remains C/D/I.

## 2016-03-27 NOTE — Sedation Documentation (Signed)
Medication dose calculated and verified for VERSED 2.5mg  IV and DEMEROL 25mg  IV

## 2016-03-27 NOTE — Sedation Documentation (Signed)
Procedure ends. Dressing to post iliac area with folded 4x4 an hypafix gauxe. Pt placed supine with towel to site for pressure

## 2016-03-28 ENCOUNTER — Ambulatory Visit: Payer: Medicare Other

## 2016-03-29 ENCOUNTER — Ambulatory Visit: Payer: Medicare Other

## 2016-03-30 ENCOUNTER — Ambulatory Visit: Payer: Medicare Other

## 2016-03-30 ENCOUNTER — Other Ambulatory Visit (HOSPITAL_BASED_OUTPATIENT_CLINIC_OR_DEPARTMENT_OTHER): Payer: Medicare Other

## 2016-03-30 ENCOUNTER — Encounter: Payer: Self-pay | Admitting: Hematology & Oncology

## 2016-03-30 ENCOUNTER — Ambulatory Visit (HOSPITAL_BASED_OUTPATIENT_CLINIC_OR_DEPARTMENT_OTHER): Payer: Medicare Other | Admitting: Hematology & Oncology

## 2016-03-30 VITALS — BP 138/71 | HR 94 | Temp 97.6°F | Resp 18 | Ht 72.0 in | Wt 206.0 lb

## 2016-03-30 DIAGNOSIS — D4620 Refractory anemia with excess of blasts, unspecified: Secondary | ICD-10-CM | POA: Diagnosis not present

## 2016-03-30 DIAGNOSIS — D469 Myelodysplastic syndrome, unspecified: Secondary | ICD-10-CM

## 2016-03-30 LAB — CBC WITH DIFFERENTIAL (CANCER CENTER ONLY)
BASO#: 0.1 10*3/uL (ref 0.0–0.2)
BASO%: 0.9 % (ref 0.0–2.0)
EOS%: 0.2 % (ref 0.0–7.0)
Eosinophils Absolute: 0 10*3/uL (ref 0.0–0.5)
HEMATOCRIT: 36.3 % — AB (ref 38.7–49.9)
HEMOGLOBIN: 11.6 g/dL — AB (ref 13.0–17.1)
LYMPH#: 0.5 10*3/uL — AB (ref 0.9–3.3)
LYMPH%: 10 % — ABNORMAL LOW (ref 14.0–48.0)
MCH: 32.9 pg (ref 28.0–33.4)
MCHC: 32 g/dL (ref 32.0–35.9)
MCV: 103 fL — ABNORMAL HIGH (ref 82–98)
MONO#: 0.8 10*3/uL (ref 0.1–0.9)
MONO%: 15.1 % — AB (ref 0.0–13.0)
NEUT%: 73.8 % (ref 40.0–80.0)
NEUTROS ABS: 3.9 10*3/uL (ref 1.5–6.5)
Platelets: 717 10*3/uL — ABNORMAL HIGH (ref 145–400)
RBC: 3.53 10*6/uL — ABNORMAL LOW (ref 4.20–5.70)
RDW: 18 % — AB (ref 11.1–15.7)
WBC: 5.3 10*3/uL (ref 4.0–10.0)

## 2016-03-30 LAB — COMPREHENSIVE METABOLIC PANEL
ALT: 17 U/L (ref 0–55)
AST: 18 U/L (ref 5–34)
Albumin: 3.2 g/dL — ABNORMAL LOW (ref 3.5–5.0)
Alkaline Phosphatase: 62 U/L (ref 40–150)
Anion Gap: 13 mEq/L — ABNORMAL HIGH (ref 3–11)
BUN: 20.1 mg/dL (ref 7.0–26.0)
CALCIUM: 9.2 mg/dL (ref 8.4–10.4)
CHLORIDE: 105 meq/L (ref 98–109)
CO2: 22 mEq/L (ref 22–29)
CREATININE: 1.4 mg/dL — AB (ref 0.7–1.3)
EGFR: 47 mL/min/{1.73_m2} — ABNORMAL LOW (ref >90)
GLUCOSE: 112 mg/dL (ref 70–140)
POTASSIUM: 4.4 meq/L (ref 3.5–5.1)
SODIUM: 139 meq/L (ref 136–145)
Total Bilirubin: <0.22 mg/dL (ref 0.20–1.20)
Total Protein: 7.2 g/dL (ref 6.4–8.3)

## 2016-03-30 LAB — TECHNOLOGIST REVIEW CHCC SATELLITE: Tech Review: 4

## 2016-03-30 MED ORDER — HEPARIN SOD (PORK) LOCK FLUSH 100 UNIT/ML IV SOLN
500.0000 [IU] | Freq: Once | INTRAVENOUS | Status: AC
  Administered 2016-03-30: 500 [IU] via INTRAVENOUS
  Filled 2016-03-30: qty 5

## 2016-03-30 MED ORDER — SODIUM CHLORIDE 0.9% FLUSH
10.0000 mL | INTRAVENOUS | Status: DC | PRN
  Administered 2016-03-30: 10 mL via INTRAVENOUS
  Filled 2016-03-30: qty 10

## 2016-03-30 NOTE — Patient Instructions (Signed)
Implanted Port Insertion An implanted port is a central line that has a round shape and is placed under the skin. It is used as a long-term IV access for:   Medicines, such as chemotherapy.   Fluids.   Liquid nutrition, such as total parenteral nutrition (TPN).   Blood samples.  LET YOUR HEALTH CARE PROVIDER KNOW ABOUT:  Allergies to food or medicine.   Medicines taken, including vitamins, herbs, eye drops, creams, and over-the-counter medicines.   Any allergies to heparin.  Use of steroids (by mouth or creams).   Previous problems with anesthetics or numbing medicines.   History of bleeding problems or blood clots.   Previous surgery.   Other health problems, including diabetes and kidney problems.   Possibility of pregnancy, if this applies. RISKS AND COMPLICATIONS Generally, this is a safe procedure. However, as with any procedure, problems can occur. Possible problems include:  Damage to the blood vessel, bruising, or bleeding at the puncture site.   Infection.  Blood clot in the vessel that the port is in.  Breakdown of the skin over your port.  Very rarely a person may develop a condition called a pneumothorax, a collection of air in the chest that may cause one of the lungs to collapse. The placement of these catheters with the appropriate imaging guidance significantly decreases the risk of a pneumothorax.  BEFORE THE PROCEDURE   Your health care provider may want you to have blood tests. These tests can help tell how well your kidneys and liver are working. They can also show how well your blood clots.   If you take blood thinners (anticoagulant medicines), ask your health care provider when you should stop taking them.   Make arrangements for someone to drive you home. This is necessary if you have been sedated for your procedure.  PROCEDURE  Port insertion usually takes about 30-45 minutes.   An IV needle will be inserted in your arm.  Medicine for pain and medicine to help relax you (sedative) will flow directly into your body through this needle.   You will lie on an exam table, and you will be connected to monitors to keep track of your heart rate, blood pressure, and breathing throughout the procedure.  An oxygen monitoring device may be attached to your finger. Oxygen will be given.   Everything will be kept as germ free (sterile) as possible during the procedure. The skin near the point of the incision will be cleansed with antiseptic, and the area will be draped with sterile towels. The skin and deeper tissues over the port area will be made numb with a local anesthetic.  Two small cuts (incisions) will be made in the skin to insert the port. One will be made in the neck to obtain access to the vein where the catheter will lie.   Because the port reservoir will be placed under the skin, a small skin incision will be made in the upper chest, and a small pocket for the port will be made under the skin. The catheter that will be connected to the port tunnels to a large central vein in the chest. A small, raised area will remain on your body at the site of the reservoir when the procedure is complete.  The port placement will be done under imaging guidance to ensure the proper placement.  The reservoir has a silicone covering that can be punctured with a special needle.   The port will be flushed with normal   saline, and blood will be drawn to make sure it is working properly.  There will be nothing remaining outside the skin when the procedure is finished.   Incisions will be held together by stitches, surgical glue, or a special tape. AFTER THE PROCEDURE  You will stay in a recovery area until the anesthesia has worn off. Your blood pressure and pulse will be checked.  A final chest X-ray will be taken to check the placement of the port and to ensure that there is no injury to your lung.   This information is  not intended to replace advice given to you by your health care provider. Make sure you discuss any questions you have with your health care provider.   Document Released: 03/18/2013 Document Revised: 06/18/2014 Document Reviewed: 03/18/2013 Elsevier Interactive Patient Education 2016 Elsevier Inc.  

## 2016-03-30 NOTE — Progress Notes (Signed)
Hematology and Oncology Follow Up Visit  Anthony [name] [id number] 15-Feb-1941 75 y.o. 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 This was after his fourth cycle of treatment. The bone marrow biopsy was done on October 17. The bone marrow otology report ([id number]) showed a hypercellular marrow with dyspoietic changes. There is no increase in blasts. His last Bomer test back in June showed 10% blasts. As such, he clearly is responding.  We did do cytogenetics. The cytogenetics are pending. Hopefully, we have eradicated the  Trisomy 11 clone.  His transfusion requirements have decreased. I do not think that he has been transfused now for about 2 months or so.  His last iron studies done back in September showed a ferritin of 2524 and a iron saturation of 33%.   Thankfully, his wife is doing better. She had GI bleeding from a ulcer. Thankfully, she did not require surgery.  He cannot tolerate the JADENU. As such he is off this now.   Overall, his performance status is ECOG 1.    Medications:    Medication List       Accurate as of 03/30/16  2:49 PM. Always use your most recent med list.          bimatoprost 0.01 % Soln Commonly known as:  LUMIGAN Place 1 drop into both eyes at bedtime.   budesonide-formoterol 160-4.5 MCG/ACT inhaler Commonly known as:  SYMBICORT Inhale 2 puffs into the lungs 2 (two) times daily.   buPROPion 150 MG 24 hr tablet Commonly known as:  WELLBUTRIN XL Take 150 mg by mouth daily.   clopidogrel 75 MG tablet Commonly known as:  PLAVIX Take 75 mg by mouth daily.     co-enzyme Q-10 30 MG capsule Take 100 mg by mouth daily.   Cyanocobalamin 2500 MCG Tabs Take 5,000 mcg by mouth daily.   Deferasirox 360 MG Tabs Commonly known as:  JADENU Take 3 capsules by mouth daily.   fish oil-omega-3 fatty acids 1000 MG capsule Take 1 g by mouth daily.   glucosamine-chondroitin 500-400 MG tablet Take 1 tablet by mouth daily.   LORazepam 0.5 MG tablet Commonly known as:  ATIVAN Take 1 tablet (0.5 mg total) by mouth every 6 (six) hours as needed (Nausea or vomiting).   magnesium oxide 400 MG tablet Commonly known as:  MAG-OX Take 400 mg by mouth daily.   pantoprazole 40 MG tablet Commonly known as:  PROTONIX Take 40 mg by mouth daily.   predniSONE 20 MG tablet Commonly known as:  DELTASONE Take 1 tablet (20 mg total) by mouth daily with breakfast.   PROAIR RESPICLICK 570 (90 Base) MCG/ACT Aepb Generic drug:  Albuterol Sulfate Inhale 2 puffs into the lungs every 6 (six) hours as needed.   prochlorperazine 10 MG tablet Commonly known as:  COMPAZINE Take 1 tablet (10 mg total) by mouth every 6 (six) hours as needed (Nausea or vomiting).   ranitidine 300 MG tablet Commonly known as:  ZANTAC Take 300 mg by mouth at bedtime.   sertraline 100 MG tablet Commonly known  as:  ZOLOFT Take 100 mg by mouth daily.   SIMBRINZA 1-0.2 % Susp Generic drug:  Brinzolamide-Brimonidine   simvastatin 40 MG tablet Commonly known as:  ZOCOR Take 40 mg by mouth every other day.   tiotropium 18 MCG inhalation capsule Commonly known as:  SPIRIVA Place 18 mcg into inhaler and inhale daily.   Vitamin D3 5000 units Tabs Take by mouth daily.       Allergies: No Known Allergies  Past Medical History, Surgical history, Social history, and Family History were reviewed and updated.  Review of Systems: All other 10 point review of systems is negative.   Physical Exam:  height is 6' (1.829 m) and weight is 206 lb (93.4 kg). His oral temperature is 97.6 F  (36.4 C). His blood pressure is 138/71 and his pulse is 94. His respiration is 18.   Wt Readings from Last 3 Encounters:  03/30/16 206 lb (93.4 kg)  03/27/16 207 lb (93.9 kg)  02/20/16 209 lb 6.4 oz (95 kg)    Well-developed and well-nourished white male in no obvious distress. Head and neck exam shows no ocular or oral lesions. He has no palpable cervical or supraclavicular lymph nodes. Lungs are clear. Cardiac exam regular rate and rhythm with no murmurs, rubs or bruits. Abdomen is soft. He has good bowel sounds. There is no fluid wave. There is no palpable liver or spleen tip. Back exam shows no tenderness over the spine, ribs or hips. Extremities shows no clubbing, cyanosis or edema. Skin exam shows no rashes, ecchymoses or petechia. Neurological exam shows no focal neurological deficits. oriented, appropriate affect  Lab Results  Component Value Date   WBC 5.3 03/30/2016   HGB 11.6 (L) 03/30/2016   HCT 36.3 (L) 03/30/2016   MCV 103 (H) 03/30/2016   PLT 717 (H) 03/30/2016   Lab Results  Component Value Date   FERRITIN 2,524 diluted (H) 02/27/2016   IRON 88 02/27/2016   TIBC 265 02/27/2016   UIBC 177 02/27/2016   IRONPCTSAT 33 02/27/2016   Lab Results  Component Value Date   RETICCTPCT 1.7 05/30/2015   RBC 3.53 (L) 03/30/2016   RETICCTABS 39.4 05/30/2015   No results found for: KPAFRELGTCHN, LAMBDASER, KAPLAMBRATIO No results found for: Kandis Cocking, [facility] Lab Results  Component Value Date   TOTALPROTELP 7.3 01/01/2014   ALBUMINELP 55.1 (L) 01/01/2014   A1GS 6.9 (H) 01/01/2014   A2GS 8.9 01/01/2014   BETS 7.9 (H) 01/01/2014   BETA2SER 6.7 (H) 01/01/2014   GAMS 14.5 01/01/2014   MSPIKE NOT DET 01/01/2014   SPEI * 01/01/2014     Chemistry      Component Value Date/Time   NA 140 03/19/2016 0850   K 3.8 03/19/2016 0850   CL 102 03/12/2016 0955   CO2 23 03/19/2016 0850   BUN 17.6 03/19/2016 0850   CREATININE 1.5 (H) 03/19/2016 0850      Component Value  Date/Time   CALCIUM 9.0 03/19/2016 0850   ALKPHOS 53 03/19/2016 0850   AST 14 03/19/2016 0850   ALT 13 03/19/2016 0850   BILITOT 0.30 03/19/2016 0850     Impression and Plan: 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  His bone marrow biopsy in June showed refractory anemia with excess blasts - 2.   He clearly is responding. He has marked decrease in his blasts. He now appears the back to refractory anemia.  His hemoglobin is actually quite good. As such, we will continue to  hold on any transfusions.  We will have to watch his iron studies.  I think we can probably alter the decitabine now to treat him on a 5 day cycle and not seven-day cycle.  I think we have the flexibility that we can hold off on treating him during the week of Thanksgiving.  I will plan for his fifth cycle of treatment. We'll try for 4 more cycles and then possibly repeating a other bone marrow test.   Volanda Napoleon, MD 10/20/20172:49 PM

## 2016-03-31 IMAGING — US US SCROTUM
1 series · 13 of 25 positions shown · non-contrast
Comparison: None.

CLINICAL DATA: Patient with left testicular mass.

EXAM:
ULTRASOUND OF SCROTUM
TECHNIQUE: Complete ultrasound examination of the testicles, epididymis, and
other scrotal structures was performed.

[Series 1: us scrotum · 0.08mm/px · 13 of 94 slices shown]
[im 1/94]
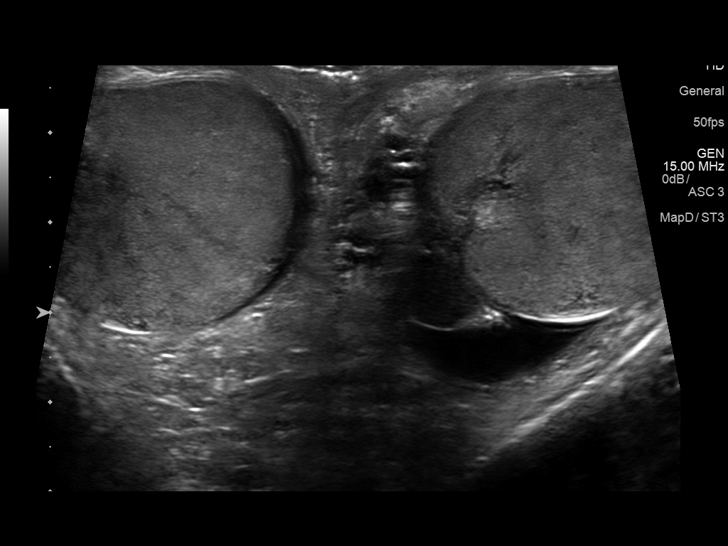
[im 8/94]
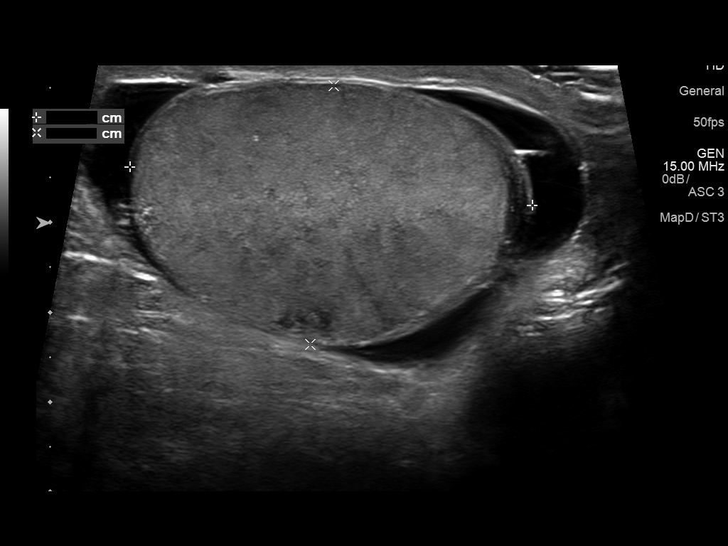
[im 16/94]
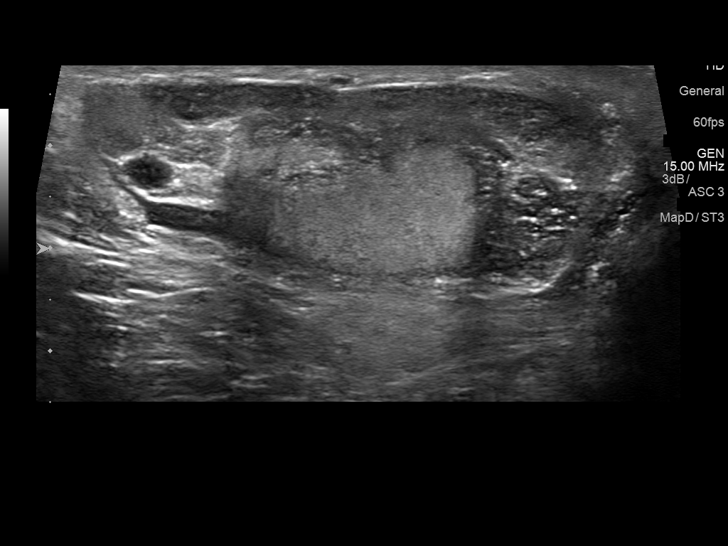
[im 24/94]
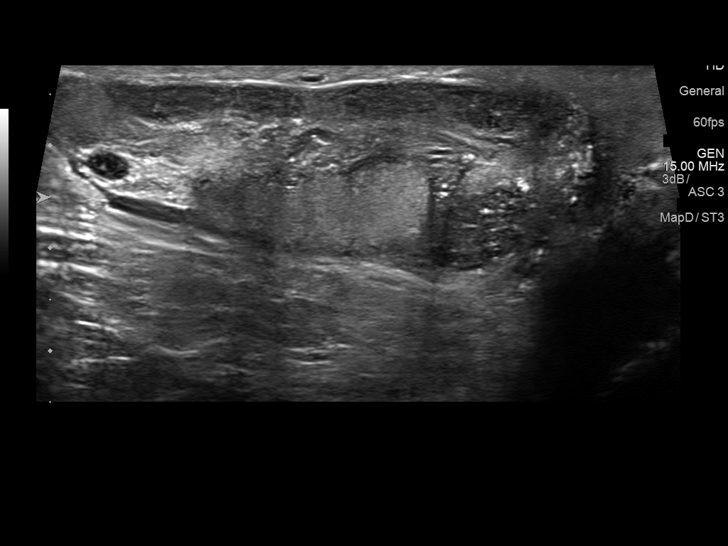
[im 32/94]
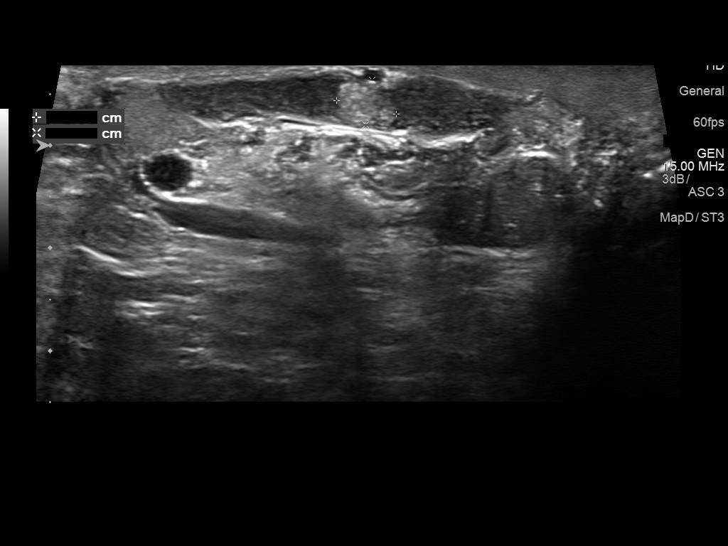
[im 39/94]
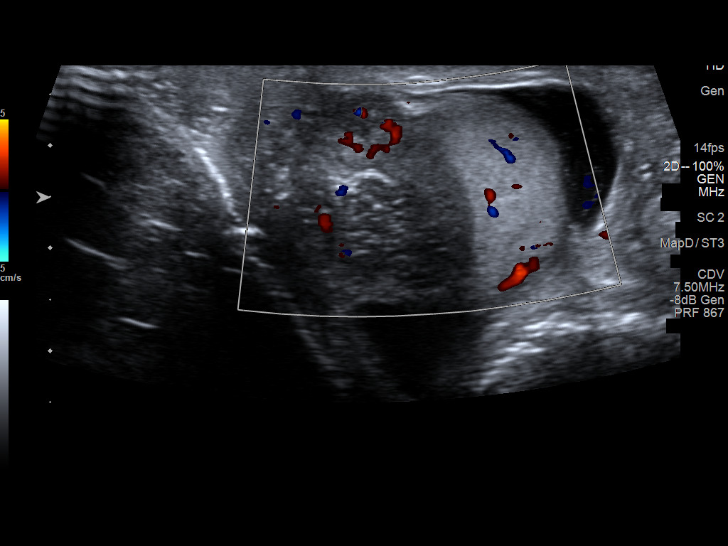
[im 47/94]
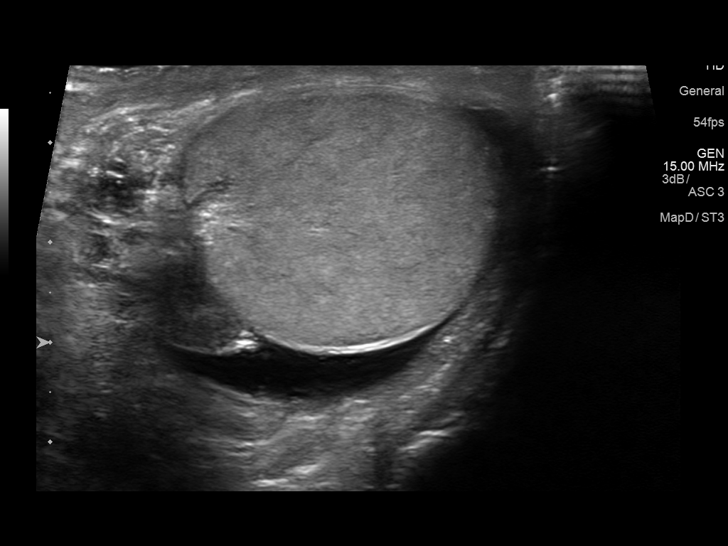
[im 55/94]
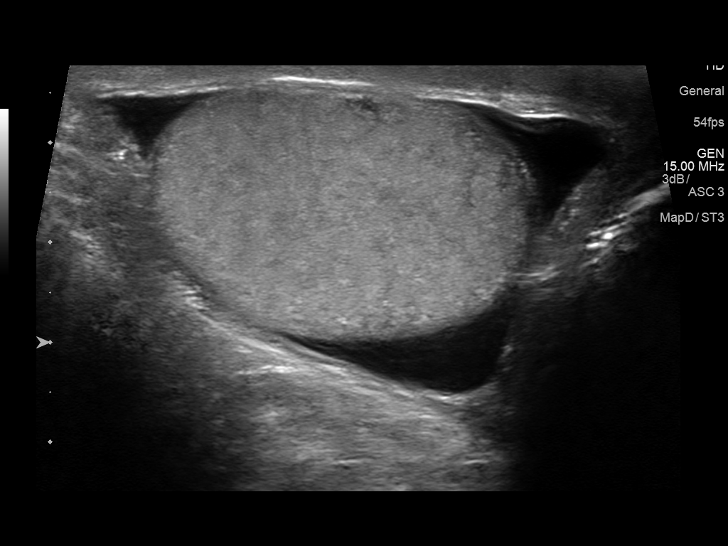
[im 63/94]
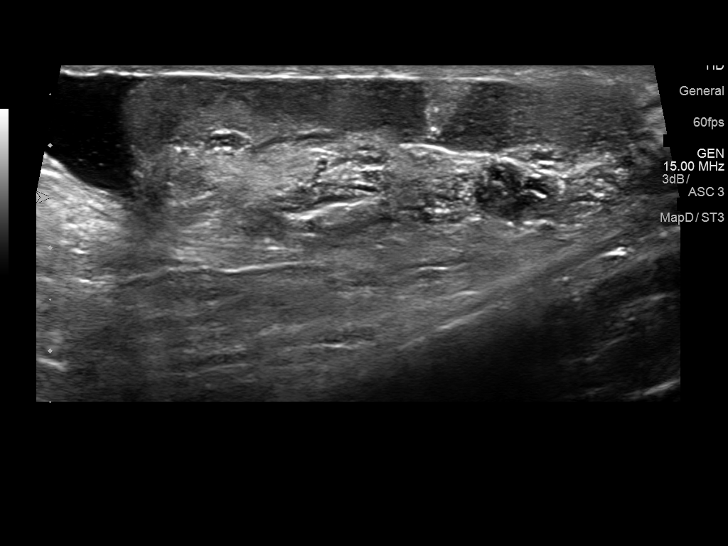
[im 70/94]
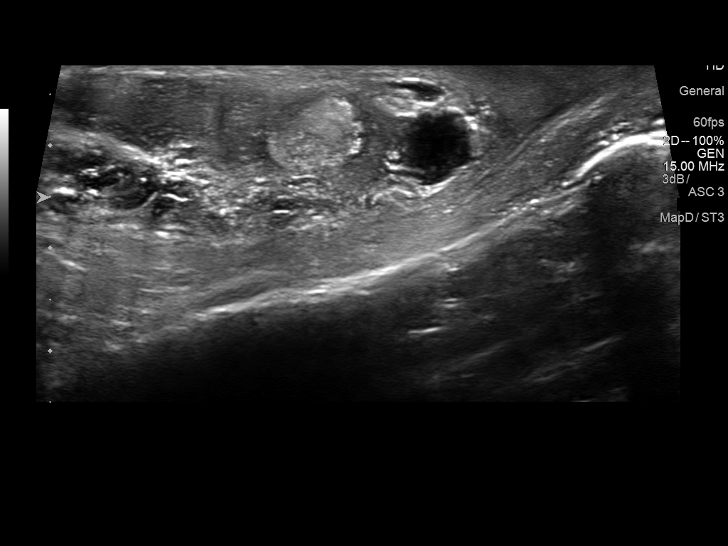
[im 78/94]
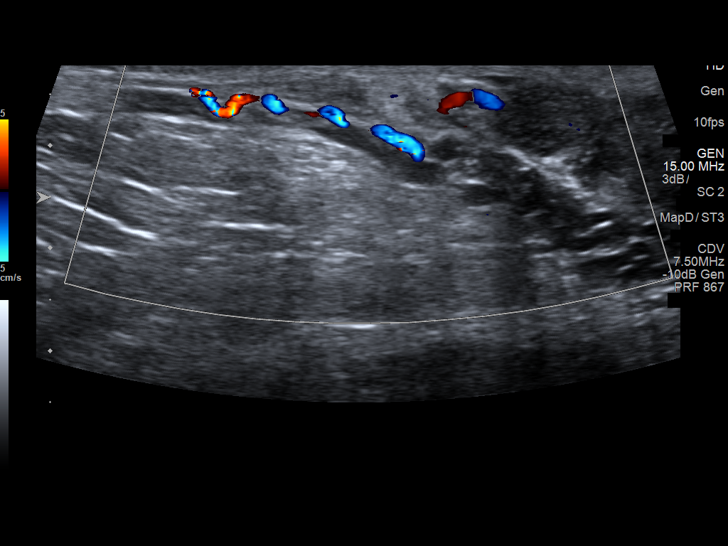
[im 86/94]
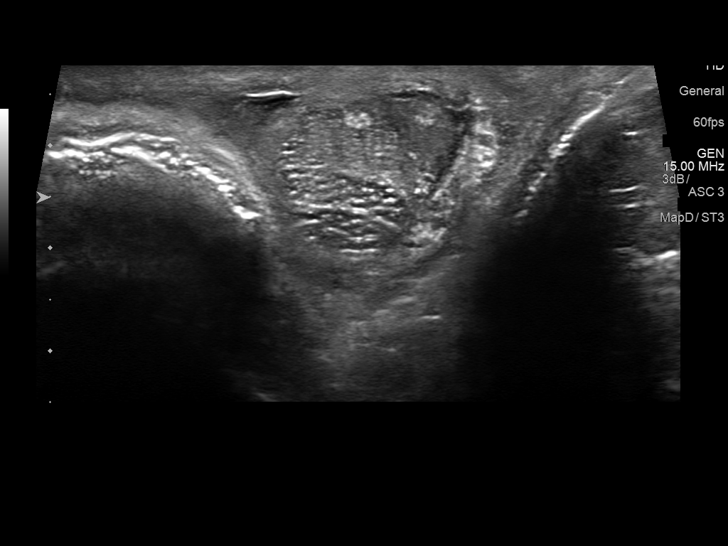
[im 94/94]
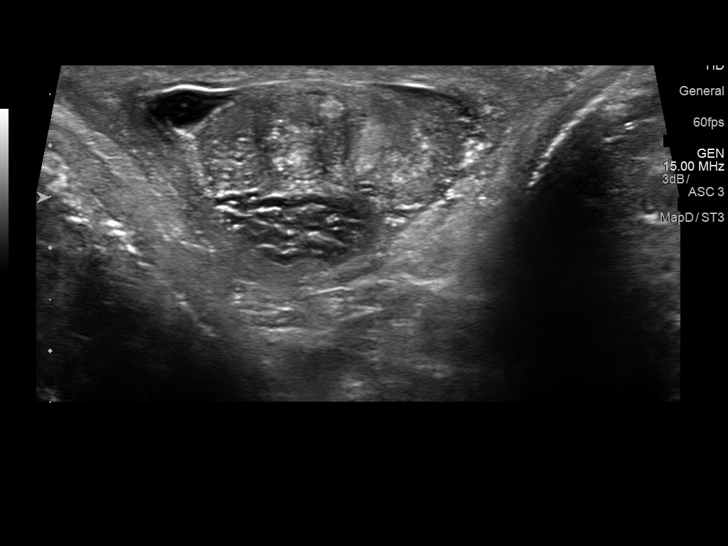

[13 of 25 positions shown; findings below may reference images not displayed]

FINDINGS: Right testicle

Measurements: 4.5 x 2.9 x 3.1 cm. No specific mass identified.
Atrophy.

Left testicle

Measurements: 4.3 x 2.6 x 3.2 cm. Nonspecific mass identified.
Atrophy.

Right epididymis: Small cyst within the epididymal head. Multiple
hyperechoic masses are demonstrated throughout the length of the
epididymis measuring up to 6 x 4 mm.

Left epididymis: Small cyst within the epididymal head. Multiple
echogenic masses are demonstrated throughout the epididymal body
measuring up to 6 5 mm.

Hydrocele:  Small bilateral hydroceles.

Varicocele:  None visualized.
IMPRESSION: There are small echogenic masses demonstrated within the epididymis
bilaterally which are nonspecific. These may be secondary to a prior
infectious or inflammatory process given the distribution and
appearance. Malignancy is not favored however not entirely excluded.
Consider urologic consultation. Additionally consider short-term
follow-up ultrasound in 2-3 months to demonstrate stability.

## 2016-04-02 ENCOUNTER — Other Ambulatory Visit: Payer: Medicare Other

## 2016-04-02 ENCOUNTER — Ambulatory Visit: Payer: Medicare Other

## 2016-04-02 ENCOUNTER — Ambulatory Visit (HOSPITAL_BASED_OUTPATIENT_CLINIC_OR_DEPARTMENT_OTHER): Payer: Medicare Other

## 2016-04-02 VITALS — BP 141/71 | HR 93 | Temp 97.6°F | Resp 18

## 2016-04-02 DIAGNOSIS — D4621 Refractory anemia with excess of blasts 1: Secondary | ICD-10-CM | POA: Diagnosis not present

## 2016-04-02 DIAGNOSIS — Z5111 Encounter for antineoplastic chemotherapy: Secondary | ICD-10-CM

## 2016-04-02 DIAGNOSIS — D4620 Refractory anemia with excess of blasts, unspecified: Secondary | ICD-10-CM

## 2016-04-02 MED ORDER — PROCHLORPERAZINE MALEATE 10 MG PO TABS
10.0000 mg | ORAL_TABLET | Freq: Once | ORAL | Status: AC
  Administered 2016-04-02: 10 mg via ORAL

## 2016-04-02 MED ORDER — PROCHLORPERAZINE MALEATE 10 MG PO TABS
ORAL_TABLET | ORAL | Status: AC
  Filled 2016-04-02: qty 1

## 2016-04-02 MED ORDER — HEPARIN SOD (PORK) LOCK FLUSH 100 UNIT/ML IV SOLN
500.0000 [IU] | Freq: Once | INTRAVENOUS | Status: AC | PRN
Start: 2016-04-02 — End: 2016-04-02
  Administered 2016-04-02: 500 [IU]
  Filled 2016-04-02: qty 5

## 2016-04-02 MED ORDER — SODIUM CHLORIDE 0.9% FLUSH
10.0000 mL | INTRAVENOUS | Status: DC | PRN
  Administered 2016-04-02: 10 mL
  Filled 2016-04-02: qty 10

## 2016-04-02 MED ORDER — SODIUM CHLORIDE 0.9 % IV SOLN
Freq: Once | INTRAVENOUS | Status: AC
  Administered 2016-04-02: 11:00:00 via INTRAVENOUS

## 2016-04-02 MED ORDER — SODIUM CHLORIDE 0.9 % IV SOLN
20.0000 mg/m2 | Freq: Once | INTRAVENOUS | Status: AC
  Administered 2016-04-02: 45 mg via INTRAVENOUS
  Filled 2016-04-02: qty 9

## 2016-04-02 NOTE — Patient Instructions (Signed)
[facility] Discharge Instructions for Patients Receiving Chemotherapy  Today you received the following chemotherapy agents Decitabine  To help prevent nausea and vomiting after your treatment, we encourage you to take your nausea medication    If you develop nausea and vomiting that is not controlled by your nausea medication, call the clinic.   BELOW ARE SYMPTOMS THAT SHOULD BE REPORTED IMMEDIATELY:  *FEVER GREATER THAN 100.5 F  *CHILLS WITH OR WITHOUT FEVER  NAUSEA AND VOMITING THAT IS NOT CONTROLLED WITH YOUR NAUSEA MEDICATION  *UNUSUAL SHORTNESS OF BREATH  *UNUSUAL BRUISING OR BLEEDING  TENDERNESS IN MOUTH AND THROAT WITH OR WITHOUT PRESENCE OF ULCERS  *URINARY PROBLEMS  *BOWEL PROBLEMS  UNUSUAL RASH Items with * indicate a potential emergency and should be followed up as soon as possible.  Feel free to call the clinic you have any questions or concerns. The clinic phone number is [phone].  Please show the [facility] at check-in to the Emergency Department and triage nurse.  Darbepoetin Alfa injection What is this medicine? DARBEPOETIN ALFA (dar be POE e tin AL fa) helps your body make more red blood cells. It is used to treat anemia caused by chronic kidney failure and chemotherapy. This medicine may be used for other purposes; ask your health care provider or pharmacist if you have questions. What should I tell my health care provider before I take this medicine? They need to know if you have any of these conditions: -blood clotting disorders or history of blood clots -cancer patient not on chemotherapy -cystic fibrosis -heart disease, such as angina, heart failure, or a history of a heart attack -hemoglobin level of 12 g/dL or greater -high blood pressure -low levels of folate, iron, or vitamin B12 -seizures -an unusual or allergic reaction to darbepoetin, erythropoietin, albumin, hamster proteins, latex, other medicines, foods,  dyes, or preservatives -pregnant or trying to get pregnant -breast-feeding How should I use this medicine? This medicine is for injection into a vein or under the skin. It is usually given by a health care professional in a hospital or clinic setting. If you get this medicine at home, you will be taught how to prepare and give this medicine. Do not shake the solution before you withdraw a dose. Use exactly as directed. Take your medicine at regular intervals. Do not take your medicine more often than directed. It is important that you put your used needles and syringes in a special sharps container. Do not put them in a trash can. If you do not have a sharps container, call your pharmacist or healthcare provider to get one. Talk to your pediatrician regarding the use of this medicine in children. While this medicine may be used in children as young as 1 year for selected conditions, precautions do apply. Overdosage: If you think you have taken too much of this medicine contact a poison control center or emergency room at once. NOTE: This medicine is only for you. Do not share this medicine with others. What if I miss a dose? If you miss a dose, take it as soon as you can. If it is almost time for your next dose, take only that dose. Do not take double or extra doses. What may interact with this medicine? Do not take this medicine with any of the following medications: -epoetin alfa This list may not describe all possible interactions. Give your health care provider a list of all the medicines, herbs, non-prescription drugs, or dietary supplements you  use. Also tell them if you smoke, drink alcohol, or use illegal drugs. Some items may interact with your medicine. What should I watch for while using this medicine? Visit your prescriber or health care professional for regular checks on your progress and for the needed blood tests and blood pressure measurements. It is especially important for the  doctor to make sure your hemoglobin level is in the desired range, to limit the risk of potential side effects and to give you the best benefit. Keep all appointments for any recommended tests. Check your blood pressure as directed. Ask your doctor what your blood pressure should be and when you should contact him or her. As your body makes more red blood cells, you may need to take iron, folic acid, or vitamin B supplements. Ask your doctor or health care provider which products are right for you. If you have kidney disease continue dietary restrictions, even though this medication can make you feel better. Talk with your doctor or health care professional about the foods you eat and the vitamins that you take. What side effects may I notice from receiving this medicine? Side effects that you should report to your doctor or health care professional as soon as possible: -allergic reactions like skin rash, itching or hives, swelling of the face, lips, or tongue -breathing problems -changes in vision -chest pain -confusion, trouble speaking or understanding -feeling faint or lightheaded, falls -high blood pressure -muscle aches or pains -pain, swelling, warmth in the leg -rapid weight gain -severe headaches -sudden numbness or weakness of the face, arm or leg -trouble walking, dizziness, loss of balance or coordination -seizures (convulsions) -swelling of the ankles, feet, hands -unusually weak or tired Side effects that usually do not require medical attention (report to your doctor or health care professional if they continue or are bothersome): -diarrhea -fever, chills (flu-like symptoms) -headaches -nausea, vomiting -redness, stinging, or swelling at site where injected This list may not describe all possible side effects. Call your doctor for medical advice about side effects. You may report side effects to FDA at 1-800-FDA-1088. Where should I keep my medicine? Keep out of the reach  of children. Store in a refrigerator between 2 and 8 degrees C (36 and 46 degrees F). Do not freeze. Do not shake. Throw away any unused portion if using a single-dose vial. Throw away any unused medicine after the expiration date. NOTE: This sheet is a summary. It may not cover all possible information. If you have questions about this medicine, talk to your doctor, pharmacist, or health care provider.    2016, Elsevier/[employer]. (2008-05-11 10:23:57)

## 2016-04-03 ENCOUNTER — Ambulatory Visit (HOSPITAL_BASED_OUTPATIENT_CLINIC_OR_DEPARTMENT_OTHER): Payer: Medicare Other

## 2016-04-03 VITALS — BP 133/68 | HR 93 | Resp 18

## 2016-04-03 DIAGNOSIS — D4620 Refractory anemia with excess of blasts, unspecified: Secondary | ICD-10-CM

## 2016-04-03 DIAGNOSIS — Z5111 Encounter for antineoplastic chemotherapy: Secondary | ICD-10-CM | POA: Diagnosis not present

## 2016-04-03 MED ORDER — PROCHLORPERAZINE MALEATE 10 MG PO TABS
ORAL_TABLET | ORAL | Status: AC
  Filled 2016-04-03: qty 1

## 2016-04-03 MED ORDER — HEPARIN SOD (PORK) LOCK FLUSH 100 UNIT/ML IV SOLN
500.0000 [IU] | Freq: Once | INTRAVENOUS | Status: AC | PRN
  Administered 2016-04-03: 500 [IU]
  Filled 2016-04-03: qty 5

## 2016-04-03 MED ORDER — SODIUM CHLORIDE 0.9% FLUSH
10.0000 mL | INTRAVENOUS | Status: DC | PRN
  Administered 2016-04-03: 10 mL
  Filled 2016-04-03: qty 10

## 2016-04-03 MED ORDER — SODIUM CHLORIDE 0.9 % IV SOLN
20.0000 mg/m2 | Freq: Once | INTRAVENOUS | Status: AC
  Administered 2016-04-03: 45 mg via INTRAVENOUS
  Filled 2016-04-03: qty 9

## 2016-04-03 MED ORDER — SODIUM CHLORIDE 0.9 % IV SOLN
Freq: Once | INTRAVENOUS | Status: AC
  Administered 2016-04-03: 09:00:00 via INTRAVENOUS

## 2016-04-03 MED ORDER — PROCHLORPERAZINE MALEATE 10 MG PO TABS
10.0000 mg | ORAL_TABLET | Freq: Once | ORAL | Status: AC
  Administered 2016-04-03: 10 mg via ORAL

## 2016-04-03 NOTE — Patient Instructions (Signed)
[facility] Discharge Instructions for Patients Receiving Chemotherapy  Today you received the following chemotherapy agents Decitabine  To help prevent nausea and vomiting after your treatment, we encourage you to take your nausea medication    If you develop nausea and vomiting that is not controlled by your nausea medication, call the clinic.   BELOW ARE SYMPTOMS THAT SHOULD BE REPORTED IMMEDIATELY:  *FEVER GREATER THAN 100.5 F  *CHILLS WITH OR WITHOUT FEVER  NAUSEA AND VOMITING THAT IS NOT CONTROLLED WITH YOUR NAUSEA MEDICATION  *UNUSUAL SHORTNESS OF BREATH  *UNUSUAL BRUISING OR BLEEDING  TENDERNESS IN MOUTH AND THROAT WITH OR WITHOUT PRESENCE OF ULCERS  *URINARY PROBLEMS  *BOWEL PROBLEMS  UNUSUAL RASH Items with * indicate a potential emergency and should be followed up as soon as possible.  Feel free to call the clinic you have any questions or concerns. The clinic phone number is [phone].  Please show the [place] at check-in to the Emergency Department and triage nurse.  Darbepoetin Alfa injection What is this medicine? DARBEPOETIN ALFA (dar be POE e tin AL fa) helps your body make more red blood cells. It is used to treat anemia caused by chronic kidney failure and chemotherapy. This medicine may be used for other purposes; ask your health care provider or pharmacist if you have questions. What should I tell my health care provider before I take this medicine? They need to know if you have any of these conditions: -blood clotting disorders or history of blood clots -cancer patient not on chemotherapy -cystic fibrosis -heart disease, such as angina, heart failure, or a history of a heart attack -hemoglobin level of 12 g/dL or greater -high blood pressure -low levels of folate, iron, or vitamin B12 -seizures -an unusual or allergic reaction to darbepoetin, erythropoietin, albumin, hamster proteins, latex, other medicines, foods,  dyes, or preservatives -pregnant or trying to get pregnant -breast-feeding How should I use this medicine? This medicine is for injection into a vein or under the skin. It is usually given by a health care professional in a hospital or clinic setting. If you get this medicine at home, you will be taught how to prepare and give this medicine. Do not shake the solution before you withdraw a dose. Use exactly as directed. Take your medicine at regular intervals. Do not take your medicine more often than directed. It is important that you put your used needles and syringes in a special sharps container. Do not put them in a trash can. If you do not have a sharps container, call your pharmacist or healthcare provider to get one. Talk to your pediatrician regarding the use of this medicine in children. While this medicine may be used in children as young as 1 year for selected conditions, precautions do apply. Overdosage: If you think you have taken too much of this medicine contact a poison control center or emergency room at once. NOTE: This medicine is only for you. Do not share this medicine with others. What if I miss a dose? If you miss a dose, take it as soon as you can. If it is almost time for your next dose, take only that dose. Do not take double or extra doses. What may interact with this medicine? Do not take this medicine with any of the following medications: -epoetin alfa This list may not describe all possible interactions. Give your health care provider a list of all the medicines, herbs, non-prescription drugs, or dietary supplements you  use. Also tell them if you smoke, drink alcohol, or use illegal drugs. Some items may interact with your medicine. What should I watch for while using this medicine? Visit your prescriber or health care professional for regular checks on your progress and for the needed blood tests and blood pressure measurements. It is especially important for the  doctor to make sure your hemoglobin level is in the desired range, to limit the risk of potential side effects and to give you the best benefit. Keep all appointments for any recommended tests. Check your blood pressure as directed. Ask your doctor what your blood pressure should be and when you should contact him or her. As your body makes more red blood cells, you may need to take iron, folic acid, or vitamin B supplements. Ask your doctor or health care provider which products are right for you. If you have kidney disease continue dietary restrictions, even though this medication can make you feel better. Talk with your doctor or health care professional about the foods you eat and the vitamins that you take. What side effects may I notice from receiving this medicine? Side effects that you should report to your doctor or health care professional as soon as possible: -allergic reactions like skin rash, itching or hives, swelling of the face, lips, or tongue -breathing problems -changes in vision -chest pain -confusion, trouble speaking or understanding -feeling faint or lightheaded, falls -high blood pressure -muscle aches or pains -pain, swelling, warmth in the leg -rapid weight gain -severe headaches -sudden numbness or weakness of the face, arm or leg -trouble walking, dizziness, loss of balance or coordination -seizures (convulsions) -swelling of the ankles, feet, hands -unusually weak or tired Side effects that usually do not require medical attention (report to your doctor or health care professional if they continue or are bothersome): -diarrhea -fever, chills (flu-like symptoms) -headaches -nausea, vomiting -redness, stinging, or swelling at site where injected This list may not describe all possible side effects. Call your doctor for medical advice about side effects. You may report side effects to FDA at 1-800-FDA-1088. Where should I keep my medicine? Keep out of the reach  of children. Store in a refrigerator between 2 and 8 degrees C (36 and 46 degrees F). Do not freeze. Do not shake. Throw away any unused portion if using a single-dose vial. Throw away any unused medicine after the expiration date. NOTE: This sheet is a summary. It may not cover all possible information. If you have questions about this medicine, talk to your doctor, pharmacist, or health care provider.    2016, Elsevier/Gold Standard. (2008-05-11 10:23:57)

## 2016-04-04 ENCOUNTER — Ambulatory Visit (HOSPITAL_BASED_OUTPATIENT_CLINIC_OR_DEPARTMENT_OTHER): Payer: Medicare Other

## 2016-04-04 VITALS — BP 122/58 | HR 96 | Resp 18

## 2016-04-04 DIAGNOSIS — Z5111 Encounter for antineoplastic chemotherapy: Secondary | ICD-10-CM

## 2016-04-04 DIAGNOSIS — D4621 Refractory anemia with excess of blasts 1: Secondary | ICD-10-CM

## 2016-04-04 DIAGNOSIS — D4620 Refractory anemia with excess of blasts, unspecified: Secondary | ICD-10-CM

## 2016-04-04 MED ORDER — PROCHLORPERAZINE MALEATE 10 MG PO TABS
ORAL_TABLET | ORAL | Status: AC
  Filled 2016-04-04: qty 1

## 2016-04-04 MED ORDER — SODIUM CHLORIDE 0.9% FLUSH
10.0000 mL | INTRAVENOUS | Status: DC | PRN
  Administered 2016-04-04: 10 mL
  Filled 2016-04-04: qty 10

## 2016-04-04 MED ORDER — PROCHLORPERAZINE MALEATE 10 MG PO TABS
10.0000 mg | ORAL_TABLET | Freq: Once | ORAL | Status: AC
  Administered 2016-04-04: 10 mg via ORAL

## 2016-04-04 MED ORDER — SODIUM CHLORIDE 0.9 % IV SOLN
20.0000 mg/m2 | Freq: Once | INTRAVENOUS | Status: AC
  Administered 2016-04-04: 45 mg via INTRAVENOUS
  Filled 2016-04-04: qty 9

## 2016-04-04 MED ORDER — SODIUM CHLORIDE 0.9 % IV SOLN
Freq: Once | INTRAVENOUS | Status: AC
  Administered 2016-04-04: 10:00:00 via INTRAVENOUS

## 2016-04-04 MED ORDER — HEPARIN SOD (PORK) LOCK FLUSH 100 UNIT/ML IV SOLN
500.0000 [IU] | Freq: Once | INTRAVENOUS | Status: AC | PRN
  Administered 2016-04-04: 500 [IU]
  Filled 2016-04-04: qty 5

## 2016-04-04 NOTE — Patient Instructions (Addendum)
[facility] Discharge Instructions for Patients Receiving Chemotherapy  Today you received the following chemotherapy agents Decitabine  To help prevent nausea and vomiting after your treatment, we encourage you to take your nausea medication    If you develop nausea and vomiting that is not controlled by your nausea medication, call the clinic.   BELOW ARE SYMPTOMS THAT SHOULD BE REPORTED IMMEDIATELY:  *FEVER GREATER THAN 100.5 F  *CHILLS WITH OR WITHOUT FEVER  NAUSEA AND VOMITING THAT IS NOT CONTROLLED WITH YOUR NAUSEA MEDICATION  *UNUSUAL SHORTNESS OF BREATH  *UNUSUAL BRUISING OR BLEEDING  TENDERNESS IN MOUTH AND THROAT WITH OR WITHOUT PRESENCE OF ULCERS  *URINARY PROBLEMS  *BOWEL PROBLEMS  UNUSUAL RASH Items with * indicate a potential emergency and should be followed up as soon as possible.  Feel free to call the clinic you have any questions or concerns. The clinic phone number is [phone].  Please show the CHEMO ALERT CARD at check-in to the Emergency Department and triage nurse.   

## 2016-04-05 ENCOUNTER — Ambulatory Visit (HOSPITAL_BASED_OUTPATIENT_CLINIC_OR_DEPARTMENT_OTHER): Payer: Medicare Other

## 2016-04-05 VITALS — BP 141/71 | HR 88 | Temp 97.7°F | Resp 16

## 2016-04-05 DIAGNOSIS — Z5111 Encounter for antineoplastic chemotherapy: Secondary | ICD-10-CM

## 2016-04-05 DIAGNOSIS — D4620 Refractory anemia with excess of blasts, unspecified: Secondary | ICD-10-CM

## 2016-04-05 MED ORDER — SODIUM CHLORIDE 0.9 % IV SOLN
20.0000 mg/m2 | Freq: Once | INTRAVENOUS | Status: AC
  Administered 2016-04-05: 45 mg via INTRAVENOUS
  Filled 2016-04-05: qty 9

## 2016-04-05 MED ORDER — PROCHLORPERAZINE MALEATE 10 MG PO TABS
ORAL_TABLET | ORAL | Status: AC
Start: 2016-04-05 — End: 2016-04-05
  Filled 2016-04-05: qty 1

## 2016-04-05 MED ORDER — SODIUM CHLORIDE 0.9 % IV SOLN
Freq: Once | INTRAVENOUS | Status: AC
  Administered 2016-04-05: 10:00:00 via INTRAVENOUS

## 2016-04-05 MED ORDER — PROCHLORPERAZINE MALEATE 10 MG PO TABS
10.0000 mg | ORAL_TABLET | Freq: Once | ORAL | Status: AC
  Administered 2016-04-05: 10 mg via ORAL

## 2016-04-05 MED ORDER — HEPARIN SOD (PORK) LOCK FLUSH 100 UNIT/ML IV SOLN
500.0000 [IU] | Freq: Once | INTRAVENOUS | Status: AC | PRN
  Administered 2016-04-05: 500 [IU]
  Filled 2016-04-05: qty 5

## 2016-04-05 MED ORDER — SODIUM CHLORIDE 0.9% FLUSH
10.0000 mL | INTRAVENOUS | Status: DC | PRN
  Administered 2016-04-05: 10 mL
  Filled 2016-04-05: qty 10

## 2016-04-05 NOTE — Patient Instructions (Signed)
[facility] Discharge Instructions for Patients Receiving Chemotherapy  Today you received the following chemotherapy agents Decitabine  To help prevent nausea and vomiting after your treatment, we encourage you to take your nausea medication    If you develop nausea and vomiting that is not controlled by your nausea medication, call the clinic.   BELOW ARE SYMPTOMS THAT SHOULD BE REPORTED IMMEDIATELY:  *FEVER GREATER THAN 100.5 F  *CHILLS WITH OR WITHOUT FEVER  NAUSEA AND VOMITING THAT IS NOT CONTROLLED WITH YOUR NAUSEA MEDICATION  *UNUSUAL SHORTNESS OF BREATH  *UNUSUAL BRUISING OR BLEEDING  TENDERNESS IN MOUTH AND THROAT WITH OR WITHOUT PRESENCE OF ULCERS  *URINARY PROBLEMS  *BOWEL PROBLEMS  UNUSUAL RASH Items with * indicate a potential emergency and should be followed up as soon as possible.  Feel free to call the clinic you have any questions or concerns. The clinic phone number is [phone].  Please show the CHEMO ALERT CARD at check-in to the Emergency Department and triage nurse.   

## 2016-04-06 ENCOUNTER — Ambulatory Visit (HOSPITAL_BASED_OUTPATIENT_CLINIC_OR_DEPARTMENT_OTHER): Payer: Medicare Other

## 2016-04-06 VITALS — BP 129/65 | HR 94 | Temp 97.6°F | Resp 16

## 2016-04-06 DIAGNOSIS — Z5111 Encounter for antineoplastic chemotherapy: Secondary | ICD-10-CM | POA: Diagnosis not present

## 2016-04-06 DIAGNOSIS — D4620 Refractory anemia with excess of blasts, unspecified: Secondary | ICD-10-CM

## 2016-04-06 MED ORDER — SODIUM CHLORIDE 0.9 % IV SOLN
Freq: Once | INTRAVENOUS | Status: AC
  Administered 2016-04-06: 10:00:00 via INTRAVENOUS

## 2016-04-06 MED ORDER — SODIUM CHLORIDE 0.9% FLUSH
10.0000 mL | INTRAVENOUS | Status: DC | PRN
  Administered 2016-04-06: 10 mL
  Filled 2016-04-06: qty 10

## 2016-04-06 MED ORDER — PROCHLORPERAZINE MALEATE 10 MG PO TABS
10.0000 mg | ORAL_TABLET | Freq: Once | ORAL | Status: AC
  Administered 2016-04-06: 10 mg via ORAL

## 2016-04-06 MED ORDER — HEPARIN SOD (PORK) LOCK FLUSH 100 UNIT/ML IV SOLN
500.0000 [IU] | Freq: Once | INTRAVENOUS | Status: AC | PRN
  Administered 2016-04-06: 500 [IU]
  Filled 2016-04-06: qty 5

## 2016-04-06 MED ORDER — PROCHLORPERAZINE MALEATE 10 MG PO TABS
ORAL_TABLET | ORAL | Status: AC
  Filled 2016-04-06: qty 1

## 2016-04-06 MED ORDER — SODIUM CHLORIDE 0.9 % IV SOLN
20.0000 mg/m2 | Freq: Once | INTRAVENOUS | Status: AC
  Administered 2016-04-06: 45 mg via INTRAVENOUS
  Filled 2016-04-06: qty 9

## 2016-04-06 NOTE — Patient Instructions (Signed)
Decitabine injection for infusion What is this medicine? DECITABINE (dee SYE ta been) is a chemotherapy drug. This medicine reduces the growth of cancer cells. It is used to treat adults with myelodysplastic syndromes. This medicine may be used for other purposes; ask your health care provider or pharmacist if you have questions. What should I tell my health care provider before I take this medicine? They need to know if you have any of these conditions: -infection (especially a virus infection such as chickenpox, cold sores, or herpes) -kidney disease -liver disease -an unusual or allergic reaction to decitabine, other medicines, foods, dyes, or preservatives -pregnant or trying to get pregnant -breast-feeding How should I use this medicine? This medicine is for infusion into a vein. It is administered in a hospital or clinic by a doctor or health care professional. Talk to your pediatrician regarding the use of this medicine in children. Special care may be needed. Overdosage: If you think you have taken too much of this medicine contact a poison control center or emergency room at once. NOTE: This medicine is only for you. Do not share this medicine with others. What if I miss a dose? It is important not to miss your dose. Call your doctor or health care professional if you are unable to keep an appointment. What may interact with this medicine? -vaccines Talk to your doctor or health care professional before taking any of these medicines: -aspirin -acetaminophen -ibuprofen -ketoprofen -naproxen This list may not describe all possible interactions. Give your health care provider a list of all the medicines, herbs, non-prescription drugs, or dietary supplements you use. Also tell them if you smoke, drink alcohol, or use illegal drugs. Some items may interact with your medicine. What should I watch for while using this medicine? Visit your doctor for checks on your progress. This drug  may make you feel generally unwell. This is not uncommon, as chemotherapy can affect healthy cells as well as cancer cells. Report any side effects. Continue your course of treatment even though you feel ill unless your doctor tells you to stop. In some cases, you may be given additional medicines to help with side effects. Follow all directions for their use. Call your doctor or health care professional for advice if you get a fever, chills or sore throat, or other symptoms of a cold or flu. Do not treat yourself. This drug decreases your body's ability to fight infections. Try to avoid being around people who are sick. This medicine may increase your risk to bruise or bleed. Call your doctor or health care professional if you notice any unusual bleeding. Do not become pregnant while taking this medicine or for at least 1 month after stopping it. Women should inform their doctor if they wish to become pregnant or think they might be pregnant. Men should not father a child while taking this medicine and for at least 2 months after stopping it. There is a potential for serious side effects to an unborn child. Talk to your health care professional or pharmacist for more information. Do not breast-feed an infant while taking this medicine. What side effects may I notice from receiving this medicine? Side effects that you should report to your doctor or health care professional as soon as possible: -low blood counts - this medicine may decrease the number of white blood cells, red blood cells and platelets. You may be at increased risk for infections and bleeding. -signs of infection - fever or chills, cough, sore throat,  pain or difficulty passing urine -signs of decreased platelets or bleeding - bruising, pinpoint red spots on the skin, black, tarry stools, blood in the urine -signs of decreased red blood cells - unusual weakness or tiredness, fainting spells, lightheadedness -increased blood sugar Side  effects that usually do not require medical attention (report to your prescriber or health care professional if they continue or are bothersome): -constipation -diarrhea -headache -loss of appetite -nausea, vomiting -skin rash, itching -stomach pain -water retention -weak or tired This list may not describe all possible side effects. Call your doctor for medical advice about side effects. You may report side effects to FDA at 1-800-FDA-1088. Where should I keep my medicine? This drug is given in a hospital or clinic and will not be stored at home. NOTE: This sheet is a summary. It may not cover all possible information. If you have questions about this medicine, talk to your doctor, pharmacist, or health care provider.    2016, Elsevier/[employer] Standard. (2014-12-28 12:56:02)

## 2016-04-09 ENCOUNTER — Ambulatory Visit: Payer: Medicare Other

## 2016-04-09 ENCOUNTER — Other Ambulatory Visit (HOSPITAL_BASED_OUTPATIENT_CLINIC_OR_DEPARTMENT_OTHER): Payer: Medicare Other

## 2016-04-09 DIAGNOSIS — D4620 Refractory anemia with excess of blasts, unspecified: Secondary | ICD-10-CM

## 2016-04-09 LAB — CMP (CANCER CENTER ONLY)
ALBUMIN: 3.3 g/dL (ref 3.3–5.5)
ALK PHOS: 53 U/L (ref 26–84)
ALT: 30 U/L (ref 10–47)
AST: 25 U/L (ref 11–38)
BILIRUBIN TOTAL: 0.5 mg/dL (ref 0.20–1.60)
BUN, Bld: 20 mg/dL (ref 7–22)
CALCIUM: 9.4 mg/dL (ref 8.0–10.3)
CO2: 26 meq/L (ref 18–33)
CREATININE: 1.5 mg/dL — AB (ref 0.6–1.2)
Chloride: 102 mEq/L (ref 98–108)
Glucose, Bld: 104 mg/dL (ref 73–118)
Potassium: 4 mEq/L (ref 3.3–4.7)
Sodium: 140 mEq/L (ref 128–145)
TOTAL PROTEIN: 7.1 g/dL (ref 6.4–8.1)

## 2016-04-09 LAB — CBC WITH DIFFERENTIAL (CANCER CENTER ONLY)
BASO#: 0 10*3/uL (ref 0.0–0.2)
BASO%: 0.3 % (ref 0.0–2.0)
EOS%: 0.6 % (ref 0.0–7.0)
Eosinophils Absolute: 0.1 10*3/uL (ref 0.0–0.5)
HEMATOCRIT: 38.7 % (ref 38.7–49.9)
HEMOGLOBIN: 12.4 g/dL — AB (ref 13.0–17.1)
LYMPH#: 1.1 10*3/uL (ref 0.9–3.3)
LYMPH%: 11.4 % — ABNORMAL LOW (ref 14.0–48.0)
MCH: 32.7 pg (ref 28.0–33.4)
MCHC: 32 g/dL (ref 32.0–35.9)
MCV: 102 fL — ABNORMAL HIGH (ref 82–98)
MONO#: 0.8 10*3/uL (ref 0.1–0.9)
MONO%: 7.7 % (ref 0.0–13.0)
NEUT%: 80 % (ref 40.0–80.0)
NEUTROS ABS: 7.8 10*3/uL — AB (ref 1.5–6.5)
Platelets: 424 10*3/uL — ABNORMAL HIGH (ref 145–400)
RBC: 3.79 10*6/uL — AB (ref 4.20–5.70)
RDW: 17.5 % — ABNORMAL HIGH (ref 11.1–15.7)
WBC: 9.8 10*3/uL (ref 4.0–10.0)

## 2016-04-09 LAB — TISSUE HYBRIDIZATION (BONE MARROW)-NCBH

## 2016-04-09 LAB — CHROMOSOME ANALYSIS, BONE MARROW

## 2016-04-09 NOTE — Patient Instructions (Signed)

## 2016-04-10 ENCOUNTER — Ambulatory Visit: Payer: Medicare Other

## 2016-04-16 ENCOUNTER — Ambulatory Visit (HOSPITAL_BASED_OUTPATIENT_CLINIC_OR_DEPARTMENT_OTHER): Payer: Medicare Other

## 2016-04-16 ENCOUNTER — Ambulatory Visit: Payer: Medicare Other

## 2016-04-16 DIAGNOSIS — D4620 Refractory anemia with excess of blasts, unspecified: Secondary | ICD-10-CM

## 2016-04-16 DIAGNOSIS — Z452 Encounter for adjustment and management of vascular access device: Secondary | ICD-10-CM | POA: Diagnosis not present

## 2016-04-16 DIAGNOSIS — R110 Nausea: Secondary | ICD-10-CM

## 2016-04-16 DIAGNOSIS — F419 Anxiety disorder, unspecified: Secondary | ICD-10-CM

## 2016-04-16 LAB — CBC WITH DIFFERENTIAL (CANCER CENTER ONLY)
BASO#: 0 10*3/uL (ref 0.0–0.2)
BASO%: 0.3 % (ref 0.0–2.0)
EOS ABS: 0.2 10*3/uL (ref 0.0–0.5)
EOS%: 2.8 % (ref 0.0–7.0)
HEMATOCRIT: 33.2 % — AB (ref 38.7–49.9)
HEMOGLOBIN: 11 g/dL — AB (ref 13.0–17.1)
LYMPH#: 0.7 10*3/uL — ABNORMAL LOW (ref 0.9–3.3)
LYMPH%: 12.8 % — ABNORMAL LOW (ref 14.0–48.0)
MCH: 33.1 pg (ref 28.0–33.4)
MCHC: 33.1 g/dL (ref 32.0–35.9)
MCV: 100 fL — AB (ref 82–98)
MONO#: 0.4 10*3/uL (ref 0.1–0.9)
MONO%: 6.4 % (ref 0.0–13.0)
NEUT%: 77.7 % (ref 40.0–80.0)
NEUTROS ABS: 4.5 10*3/uL (ref 1.5–6.5)
Platelets: 150 10*3/uL (ref 145–400)
RBC: 3.32 10*6/uL — AB (ref 4.20–5.70)
RDW: 17.2 % — ABNORMAL HIGH (ref 11.1–15.7)
WBC: 5.8 10*3/uL (ref 4.0–10.0)

## 2016-04-16 LAB — COMPREHENSIVE METABOLIC PANEL
ALBUMIN: 3.2 g/dL — AB (ref 3.5–5.0)
ALK PHOS: 54 U/L (ref 40–150)
ALT: 21 U/L (ref 0–55)
AST: 16 U/L (ref 5–34)
Anion Gap: 9 mEq/L (ref 3–11)
BILIRUBIN TOTAL: 0.27 mg/dL (ref 0.20–1.20)
BUN: 20.5 mg/dL (ref 7.0–26.0)
CO2: 24 mEq/L (ref 22–29)
CREATININE: 1.2 mg/dL (ref 0.7–1.3)
Calcium: 8.9 mg/dL (ref 8.4–10.4)
Chloride: 107 mEq/L (ref 98–109)
EGFR: 59 mL/min/{1.73_m2} — AB (ref >90)
GLUCOSE: 91 mg/dL (ref 70–140)
Potassium: 3.9 mEq/L (ref 3.5–5.1)
SODIUM: 140 meq/L (ref 136–145)
TOTAL PROTEIN: 6.6 g/dL (ref 6.4–8.3)

## 2016-04-16 MED ORDER — HEPARIN SOD (PORK) LOCK FLUSH 100 UNIT/ML IV SOLN
500.0000 [IU] | Freq: Once | INTRAVENOUS | Status: AC
  Administered 2016-04-16: 500 [IU] via INTRAVENOUS
  Filled 2016-04-16: qty 5

## 2016-04-16 MED ORDER — LORAZEPAM 0.5 MG PO TABS: 0.5000 mg | ORAL_TABLET | Freq: Four times a day (QID) | ORAL | 0 refills | Status: DC | PRN

## 2016-04-16 MED ORDER — SODIUM CHLORIDE 0.9% FLUSH
10.0000 mL | INTRAVENOUS | Status: AC | PRN
  Administered 2016-04-16: 10 mL via INTRAVENOUS
  Filled 2016-04-16: qty 10

## 2016-04-16 MED FILL — LORazepam 0.5 MG TABS: 0.5 | 8 days supply | Qty: 30 | Fill #0

## 2016-04-16 NOTE — Patient Instructions (Signed)

## 2016-04-17 ENCOUNTER — Encounter: Payer: Self-pay | Admitting: Hematology & Oncology

## 2016-04-18 ENCOUNTER — Encounter (HOSPITAL_COMMUNITY): Payer: Self-pay

## 2016-04-23 ENCOUNTER — Ambulatory Visit (HOSPITAL_BASED_OUTPATIENT_CLINIC_OR_DEPARTMENT_OTHER): Payer: Medicare Other

## 2016-04-23 ENCOUNTER — Other Ambulatory Visit: Payer: Medicare Other

## 2016-04-23 ENCOUNTER — Ambulatory Visit: Payer: Medicare Other | Admitting: Hematology & Oncology

## 2016-04-23 ENCOUNTER — Ambulatory Visit: Payer: Medicare Other

## 2016-04-23 ENCOUNTER — Other Ambulatory Visit (HOSPITAL_BASED_OUTPATIENT_CLINIC_OR_DEPARTMENT_OTHER): Payer: Medicare Other

## 2016-04-23 VITALS — BP 139/73 | HR 91 | Temp 97.7°F | Resp 16

## 2016-04-23 DIAGNOSIS — D4620 Refractory anemia with excess of blasts, unspecified: Secondary | ICD-10-CM

## 2016-04-23 DIAGNOSIS — Z452 Encounter for adjustment and management of vascular access device: Secondary | ICD-10-CM | POA: Diagnosis not present

## 2016-04-23 LAB — CMP (CANCER CENTER ONLY)
ALBUMIN: 3.3 g/dL (ref 3.3–5.5)
ALT(SGPT): 27 U/L (ref 10–47)
AST: 21 U/L (ref 11–38)
Alkaline Phosphatase: 45 U/L (ref 26–84)
BUN, Bld: 19 mg/dL (ref 7–22)
CALCIUM: 9.1 mg/dL (ref 8.0–10.3)
CHLORIDE: 103 meq/L (ref 98–108)
CO2: 29 meq/L (ref 18–33)
Creat: 1.3 mg/dl — ABNORMAL HIGH (ref 0.6–1.2)
GLUCOSE: 110 mg/dL (ref 73–118)
Potassium: 4.6 mEq/L (ref 3.3–4.7)
SODIUM: 137 meq/L (ref 128–145)
Total Bilirubin: 0.5 mg/dl (ref 0.20–1.60)
Total Protein: 6.7 g/dL (ref 6.4–8.1)

## 2016-04-23 LAB — CBC WITH DIFFERENTIAL (CANCER CENTER ONLY)
BASO#: 0 10*3/uL (ref 0.0–0.2)
BASO%: 1 % (ref 0.0–2.0)
EOS ABS: 0.1 10*3/uL (ref 0.0–0.5)
EOS%: 2.2 % (ref 0.0–7.0)
HCT: 36.2 % — ABNORMAL LOW (ref 38.7–49.9)
HEMOGLOBIN: 11.7 g/dL — AB (ref 13.0–17.1)
LYMPH#: 0.7 10*3/uL — ABNORMAL LOW (ref 0.9–3.3)
LYMPH%: 16.6 % (ref 14.0–48.0)
MCH: 33.1 pg (ref 28.0–33.4)
MCHC: 32.3 g/dL (ref 32.0–35.9)
MCV: 103 fL — ABNORMAL HIGH (ref 82–98)
MONO#: 0.5 10*3/uL (ref 0.1–0.9)
MONO%: 11.2 % (ref 0.0–13.0)
NEUT%: 69 % (ref 40.0–80.0)
NEUTROS ABS: 2.8 10*3/uL (ref 1.5–6.5)
Platelets: 146 10*3/uL (ref 145–400)
RBC: 3.53 10*6/uL — ABNORMAL LOW (ref 4.20–5.70)
RDW: 18 % — AB (ref 11.1–15.7)
WBC: 4 10*3/uL (ref 4.0–10.0)

## 2016-04-23 MED ORDER — SODIUM CHLORIDE 0.9% FLUSH
10.0000 mL | INTRAVENOUS | Status: DC | PRN
  Administered 2016-04-23: 10 mL via INTRAVENOUS
  Filled 2016-04-23: qty 10

## 2016-04-23 MED ORDER — HEPARIN SOD (PORK) LOCK FLUSH 100 UNIT/ML IV SOLN
500.0000 [IU] | Freq: Once | INTRAVENOUS | Status: AC
  Administered 2016-04-23: 500 [IU] via INTRAVENOUS
  Filled 2016-04-23: qty 5

## 2016-04-23 MED FILL — predniSONE 20 MG TABS: 20 | 30 days supply | Qty: 30 | Fill #6

## 2016-04-23 NOTE — Patient Instructions (Signed)

## 2016-04-24 ENCOUNTER — Ambulatory Visit: Payer: Medicare Other

## 2016-04-25 ENCOUNTER — Ambulatory Visit: Payer: Medicare Other

## 2016-04-26 ENCOUNTER — Ambulatory Visit: Payer: Medicare Other

## 2016-04-27 ENCOUNTER — Ambulatory Visit: Payer: Medicare Other

## 2016-04-30 ENCOUNTER — Other Ambulatory Visit: Payer: Medicare Other

## 2016-04-30 ENCOUNTER — Ambulatory Visit: Payer: Medicare Other | Admitting: Hematology & Oncology

## 2016-04-30 ENCOUNTER — Ambulatory Visit: Payer: Medicare Other

## 2016-05-01 ENCOUNTER — Ambulatory Visit: Payer: Medicare Other

## 2016-05-02 ENCOUNTER — Ambulatory Visit: Payer: Medicare Other

## 2016-05-04 ENCOUNTER — Ambulatory Visit: Payer: Medicare Other

## 2016-05-07 ENCOUNTER — Ambulatory Visit: Payer: Medicare Other

## 2016-05-07 ENCOUNTER — Ambulatory Visit (HOSPITAL_BASED_OUTPATIENT_CLINIC_OR_DEPARTMENT_OTHER): Payer: Medicare Other

## 2016-05-07 ENCOUNTER — Ambulatory Visit (HOSPITAL_BASED_OUTPATIENT_CLINIC_OR_DEPARTMENT_OTHER): Payer: Medicare Other | Admitting: Hematology & Oncology

## 2016-05-07 ENCOUNTER — Other Ambulatory Visit (HOSPITAL_BASED_OUTPATIENT_CLINIC_OR_DEPARTMENT_OTHER): Payer: Medicare Other

## 2016-05-07 VITALS — BP 154/48 | HR 69 | Temp 97.0°F | Resp 18 | Wt 209.0 lb

## 2016-05-07 DIAGNOSIS — D4621 Refractory anemia with excess of blasts 1: Secondary | ICD-10-CM

## 2016-05-07 DIAGNOSIS — Z5111 Encounter for antineoplastic chemotherapy: Secondary | ICD-10-CM | POA: Diagnosis not present

## 2016-05-07 DIAGNOSIS — D4620 Refractory anemia with excess of blasts, unspecified: Secondary | ICD-10-CM

## 2016-05-07 LAB — CMP (CANCER CENTER ONLY)
ALT(SGPT): 23 U/L (ref 10–47)
AST: 28 U/L (ref 11–38)
Albumin: 3.3 g/dL (ref 3.3–5.5)
Alkaline Phosphatase: 52 U/L (ref 26–84)
BILIRUBIN TOTAL: 0.4 mg/dL (ref 0.20–1.60)
BUN, Bld: 20 mg/dL (ref 7–22)
CALCIUM: 9.5 mg/dL (ref 8.0–10.3)
CO2: 28 meq/L (ref 18–33)
CREATININE: 1.5 mg/dL — AB (ref 0.6–1.2)
Chloride: 100 mEq/L (ref 98–108)
GLUCOSE: 101 mg/dL (ref 73–118)
Potassium: 4.2 mEq/L (ref 3.3–4.7)
SODIUM: 141 meq/L (ref 128–145)
Total Protein: 6.8 g/dL (ref 6.4–8.1)

## 2016-05-07 LAB — CBC WITH DIFFERENTIAL (CANCER CENTER ONLY)
BASO#: 0.1 10*3/uL (ref 0.0–0.2)
BASO%: 1 % (ref 0.0–2.0)
EOS ABS: 0 10*3/uL (ref 0.0–0.5)
EOS%: 0.4 % (ref 0.0–7.0)
HEMATOCRIT: 38.2 % — AB (ref 38.7–49.9)
HGB: 12.2 g/dL — ABNORMAL LOW (ref 13.0–17.1)
LYMPH#: 1.2 10*3/uL (ref 0.9–3.3)
LYMPH%: 14.7 % (ref 14.0–48.0)
MCH: 32.7 pg (ref 28.0–33.4)
MCHC: 31.9 g/dL — AB (ref 32.0–35.9)
MCV: 102 fL — AB (ref 82–98)
MONO#: 0.9 10*3/uL (ref 0.1–0.9)
MONO%: 11.9 % (ref 0.0–13.0)
NEUT#: 5.7 10*3/uL (ref 1.5–6.5)
NEUT%: 72 % (ref 40.0–80.0)
PLATELETS: 245 10*3/uL (ref 145–400)
RBC: 3.73 10*6/uL — ABNORMAL LOW (ref 4.20–5.70)
RDW: 17.3 % — AB (ref 11.1–15.7)
WBC: 7.8 10*3/uL (ref 4.0–10.0)

## 2016-05-07 LAB — IRON AND TIBC
%SAT: 37 % (ref 20–55)
Iron: 105 ug/dL (ref 42–163)
TIBC: 284 ug/dL (ref 202–409)
UIBC: 178 ug/dL (ref 117–376)

## 2016-05-07 LAB — FERRITIN: Ferritin: 2561 ng/ml — ABNORMAL HIGH (ref 22–316)

## 2016-05-07 MED ORDER — SODIUM CHLORIDE 0.9 % IV SOLN
20.0000 mg/m2 | Freq: Once | INTRAVENOUS | Status: AC
  Administered 2016-05-07: 45 mg via INTRAVENOUS
  Filled 2016-05-07: qty 9

## 2016-05-07 MED ORDER — PROCHLORPERAZINE MALEATE 10 MG PO TABS
10.0000 mg | ORAL_TABLET | Freq: Once | ORAL | Status: AC
  Administered 2016-05-07: 10 mg via ORAL

## 2016-05-07 MED ORDER — HEPARIN SOD (PORK) LOCK FLUSH 100 UNIT/ML IV SOLN
500.0000 [IU] | Freq: Once | INTRAVENOUS | Status: AC | PRN
  Administered 2016-05-07: 500 [IU]
  Filled 2016-05-07: qty 5

## 2016-05-07 MED ORDER — SODIUM CHLORIDE 0.9 % IV SOLN
Freq: Once | INTRAVENOUS | Status: AC
  Administered 2016-05-07: 10:00:00 via INTRAVENOUS

## 2016-05-07 MED ORDER — SODIUM CHLORIDE 0.9% FLUSH
10.0000 mL | INTRAVENOUS | Status: DC | PRN
  Administered 2016-05-07: 10 mL
  Filled 2016-05-07: qty 10

## 2016-05-07 MED ORDER — PROCHLORPERAZINE MALEATE 10 MG PO TABS
ORAL_TABLET | ORAL | Status: AC
  Filled 2016-05-07: qty 1

## 2016-05-07 NOTE — Progress Notes (Signed)
Hematology and Oncology Follow Up Visit  Anthony [name] [id number] 12/13/1940 75 y.o. 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 This was after his fourth cycle of treatment. The bone marrow biopsy was done on October 17. The bone marrow otology report ([id number]) showed a hypercellular marrow with dyspoietic changes. There is no increase in blasts. His last Anthony [name] test back in June showed 10% blasts. As such, he clearly is responding.  We did do cytogenetics. The cytogenetics are pending. Hopefully, we have eradicated the  Trisomy 11 clone.  His transfusion requirements have decreased. I do not think that he has been transfused now for about 2 months or so.  His last iron studies done back in September showed a ferritin of 2524 and a iron saturation of 33%.   Thankfully, his wife is doing better. She had GI bleeding from a ulcer. Thankfully, she did not require surgery.  He cannot tolerate the JADENU. As such he is off this now.   Overall, his performance status is ECOG 1.    Medications:    Medication List       Accurate as of 05/07/16 10:12 AM. Always use your most recent med list.          bimatoprost 0.01 % Soln Commonly known as:  LUMIGAN Place 1 drop into both eyes at bedtime.   budesonide-formoterol 160-4.5 MCG/ACT inhaler Commonly known as:  SYMBICORT Inhale 2 puffs into the lungs 2 (two) times daily.   buPROPion 150 MG 24 hr tablet Commonly known as:  WELLBUTRIN XL Take 150 mg by mouth daily.   clopidogrel 75 MG tablet Commonly known as:  PLAVIX Take 75 mg by mouth daily.     co-enzyme Q-10 30 MG capsule Take 100 mg by mouth daily.   Cyanocobalamin 2500 MCG Tabs Take 5,000 mcg by mouth daily.   Deferasirox 360 MG Tabs Commonly known as:  JADENU Take 3 capsules by mouth daily.   fish oil-omega-3 fatty acids 1000 MG capsule Take 1 g by mouth daily.   glucosamine-chondroitin 500-400 MG tablet Take 1 tablet by mouth daily.   LORazepam 0.5 MG tablet Commonly known as:  ATIVAN Take 1 tablet (0.5 mg total) by mouth every 6 (six) hours as needed (Nausea or vomiting).   magnesium oxide 400 MG tablet Commonly known as:  MAG-OX Take 400 mg by mouth daily.   pantoprazole 40 MG tablet Commonly known as:  PROTONIX Take 40 mg by mouth daily.   predniSONE 20 MG tablet Commonly known as:  DELTASONE Take 1 tablet (20 mg total) by mouth daily with breakfast.   PROAIR RESPICLICK 256 (90 Base) MCG/ACT Aepb Generic drug:  Albuterol Sulfate Inhale 2 puffs into the lungs every 6 (six) hours as needed.   prochlorperazine 10 MG tablet Commonly known as:  COMPAZINE Take 1 tablet (10 mg total) by mouth every 6 (six) hours as needed (Nausea or vomiting).   ranitidine 300 MG tablet Commonly known as:  ZANTAC Take 300 mg by mouth at bedtime.   sertraline 100 MG tablet Commonly known as:  ZOLOFT Take 100 mg by mouth daily.   SIMBRINZA 1-0.2 % Susp Generic drug:  Brinzolamide-Brimonidine   simvastatin 40 MG tablet Commonly known as:  ZOCOR Take 40 mg by mouth every other day.   tiotropium 18 MCG inhalation capsule Commonly known as:  SPIRIVA Place 18 mcg into inhaler and inhale daily.   Vitamin D3 5000 units Tabs Take by mouth daily.       Allergies: No Known Allergies  Past Medical History, Surgical history, Social history, and Family History were reviewed and updated.  Review of Systems: All other 10 point review of systems is negative.   Physical Exam:  weight is 209 lb (94.8 kg). His oral temperature is 97 F (36.1 C). His blood pressure is  154/48 (abnormal) and his pulse is 69. His respiration is 18 and oxygen saturation is 99%.   Wt Readings from Last 3 Encounters:  05/07/16 209 lb (94.8 kg)  03/30/16 206 lb (93.4 kg)  03/27/16 207 lb (93.9 kg)    Well-developed and well-nourished white male in no obvious distress. Head and neck exam shows no ocular or oral lesions. He has no palpable cervical or supraclavicular lymph nodes. Lungs are clear. Cardiac exam regular rate and rhythm with no murmurs, rubs or bruits. Abdomen is soft. He has good bowel sounds. There is no fluid wave. There is no palpable liver or spleen tip. Back exam shows no tenderness over the spine, ribs or hips. Extremities shows no clubbing, cyanosis or edema. Skin exam shows no rashes, ecchymoses or petechia. Neurological exam shows no focal neurological deficits. oriented, appropriate affect  Lab Results  Component Value Date   WBC 7.8 05/07/2016   HGB 12.2 (L) 05/07/2016   HCT 38.2 (L) 05/07/2016   MCV 102 (H) 05/07/2016   PLT 245 05/07/2016   Lab Results  Component Value Date   FERRITIN 2,524 diluted (H) 02/27/2016   IRON 88 02/27/2016   TIBC 265 02/27/2016   UIBC 177 02/27/2016   IRONPCTSAT 33 02/27/2016   Lab Results  Component Value Date   RETICCTPCT 1.7 05/30/2015   RBC 3.73 (L) 05/07/2016   RETICCTABS 39.4 05/30/2015   No results found for: KPAFRELGTCHN, LAMBDASER, KAPLAMBRATIO No results found for: Osborne [facility] Lab Results  Component Value Date   TOTALPROTELP 7.3 01/01/2014   ALBUMINELP 55.1 (L) 01/01/2014   A1GS 6.9 (H) 01/01/2014   A2GS 8.9 01/01/2014   BETS 7.9 (H) 01/01/2014   BETA2SER 6.7 (H) 01/01/2014   GAMS 14.5 01/01/2014   MSPIKE NOT DET 01/01/2014   SPEI * 01/01/2014     Chemistry      Component Value Date/Time   NA 141 05/07/2016 0835   NA 140 04/16/2016 0930   K 4.2 05/07/2016 0835   K 3.9 04/16/2016 0930   CL 100 05/07/2016 0835   CO2 28 05/07/2016 0835   CO2 24 04/16/2016 0930   BUN 20  05/07/2016 0835   BUN 20.5 04/16/2016 0930   CREATININE 1.5 (H) 05/07/2016 0835   CREATININE 1.2 04/16/2016 0930      Component Value Date/Time   CALCIUM 9.5 05/07/2016 0835   CALCIUM 8.9 04/16/2016 0930   ALKPHOS 52 05/07/2016 0835   ALKPHOS 54 04/16/2016 0930   AST 28 05/07/2016 0835   AST 16 04/16/2016 0930   ALT 23 05/07/2016 0835   ALT 21 04/16/2016 0930   BILITOT 0.40 05/07/2016 0835   BILITOT 0.27 04/16/2016 0930     Impression and Plan: Mr. Bolger is 75 year old  gentleman with myelodysplasia and refractory anemia.  His bone marrow biopsy in June showed refractory anemia with excess blasts - 2.   He clearly is responding. His blood counts are pretty much normal. We have not had to transfuse him now for a couple months or so.  We will go ahead and do another bone marrow test on him. I we'll set him up for December 26. I think at his bone marrow test looks good, then we can move his treatment dates out to every 6 weeks. Both he and his wife agree with this. marrow test.  We'll go ahead with the sixth cycle of treatment today.  Volanda Napoleon, MD 11/27/201710:12 AM

## 2016-05-07 NOTE — Patient Instructions (Signed)
[facility] Discharge Instructions for Patients Receiving Chemotherapy  Today you received the following chemotherapy agents Decitabine  To help prevent nausea and vomiting after your treatment, we encourage you to take your nausea medication    If you develop nausea and vomiting that is not controlled by your nausea medication, call the clinic.   BELOW ARE SYMPTOMS THAT SHOULD BE REPORTED IMMEDIATELY:  *FEVER GREATER THAN 100.5 F  *CHILLS WITH OR WITHOUT FEVER  NAUSEA AND VOMITING THAT IS NOT CONTROLLED WITH YOUR NAUSEA MEDICATION  *UNUSUAL SHORTNESS OF BREATH  *UNUSUAL BRUISING OR BLEEDING  TENDERNESS IN MOUTH AND THROAT WITH OR WITHOUT PRESENCE OF ULCERS  *URINARY PROBLEMS  *BOWEL PROBLEMS  UNUSUAL RASH Items with * indicate a potential emergency and should be followed up as soon as possible.  Feel free to call the clinic you have any questions or concerns. The clinic phone number is [phone].  Please show the CHEMO ALERT CARD at check-in to the Emergency Department and triage nurse.   

## 2016-05-08 ENCOUNTER — Ambulatory Visit (HOSPITAL_BASED_OUTPATIENT_CLINIC_OR_DEPARTMENT_OTHER): Payer: Medicare Other

## 2016-05-08 ENCOUNTER — Ambulatory Visit: Payer: Medicare Other

## 2016-05-08 VITALS — BP 112/67 | HR 72 | Temp 97.0°F

## 2016-05-08 DIAGNOSIS — D4621 Refractory anemia with excess of blasts 1: Secondary | ICD-10-CM | POA: Diagnosis not present

## 2016-05-08 DIAGNOSIS — Z5111 Encounter for antineoplastic chemotherapy: Secondary | ICD-10-CM | POA: Diagnosis not present

## 2016-05-08 DIAGNOSIS — D4620 Refractory anemia with excess of blasts, unspecified: Secondary | ICD-10-CM

## 2016-05-08 MED ORDER — PROCHLORPERAZINE MALEATE 10 MG PO TABS
ORAL_TABLET | ORAL | Status: AC
  Filled 2016-05-08: qty 1

## 2016-05-08 MED ORDER — SODIUM CHLORIDE 0.9% FLUSH
10.0000 mL | INTRAVENOUS | Status: DC | PRN
  Administered 2016-05-08: 10 mL
  Filled 2016-05-08: qty 10

## 2016-05-08 MED ORDER — PROCHLORPERAZINE MALEATE 10 MG PO TABS
10.0000 mg | ORAL_TABLET | Freq: Once | ORAL | Status: AC
  Administered 2016-05-08: 10 mg via ORAL

## 2016-05-08 MED ORDER — HEPARIN SOD (PORK) LOCK FLUSH 100 UNIT/ML IV SOLN
500.0000 [IU] | Freq: Once | INTRAVENOUS | Status: AC | PRN
  Administered 2016-05-08: 500 [IU]
  Filled 2016-05-08: qty 5

## 2016-05-08 MED ORDER — SODIUM CHLORIDE 0.9 % IV SOLN
Freq: Once | INTRAVENOUS | Status: AC
  Administered 2016-05-08: 10:00:00 via INTRAVENOUS

## 2016-05-08 MED ORDER — SODIUM CHLORIDE 0.9 % IV SOLN
20.0000 mg/m2 | Freq: Once | INTRAVENOUS | Status: AC
  Administered 2016-05-08: 45 mg via INTRAVENOUS
  Filled 2016-05-08: qty 9

## 2016-05-08 NOTE — Patient Instructions (Signed)
Decitabine injection for infusion What is this medicine? DECITABINE (dee SYE ta been) is a chemotherapy drug. This medicine reduces the growth of cancer cells. It is used to treat adults with myelodysplastic syndromes. COMMON BRAND NAME(S): Dacogen What should I tell my health care provider before I take this medicine? They need to know if you have any of these conditions: -infection (especially a virus infection such as chickenpox, cold sores, or herpes) -kidney disease -liver disease -an unusual or allergic reaction to decitabine, other medicines, foods, dyes, or preservatives -pregnant or trying to get pregnant -breast-feeding How should I use this medicine? This medicine is for infusion into a vein. It is administered in a hospital or clinic by a doctor or health care professional. Talk to your pediatrician regarding the use of this medicine in children. Special care may be needed. What if I miss a dose? It is important not to miss your dose. Call your doctor or health care professional if you are unable to keep an appointment. What may interact with this medicine? -vaccines Talk to your doctor or health care professional before taking any of these medicines: -aspirin -acetaminophen -ibuprofen -ketoprofen -naproxen What should I watch for while using this medicine? Visit your doctor for checks on your progress. This drug may make you feel generally unwell. This is not uncommon, as chemotherapy can affect healthy cells as well as cancer cells. Report any side effects. Continue your course of treatment even though you feel ill unless your doctor tells you to stop. In some cases, you may be given additional medicines to help with side effects. Follow all directions for their use. Call your doctor or health care professional for advice if you get a fever, chills or sore throat, or other symptoms of a cold or flu. Do not treat yourself. This drug decreases your body's ability to fight  infections. Try to avoid being around people who are sick. This medicine may increase your risk to bruise or bleed. Call your doctor or health care professional if you notice any unusual bleeding. Do not become pregnant while taking this medicine or for at least 1 month after stopping it. Women should inform their doctor if they wish to become pregnant or think they might be pregnant. Men should not father a child while taking this medicine and for at least 2 months after stopping it. There is a potential for serious side effects to an unborn child. Talk to your health care professional or pharmacist for more information. Do not breast-feed an infant while taking this medicine. What side effects may I notice from receiving this medicine? Side effects that you should report to your doctor or health care professional as soon as possible: -low blood counts - this medicine may decrease the number of white blood cells, red blood cells and platelets. You may be at increased risk for infections and bleeding. -signs of infection - fever or chills, cough, sore throat, pain or difficulty passing urine -signs of decreased platelets or bleeding - bruising, pinpoint red spots on the skin, black, tarry stools, blood in the urine -signs of decreased red blood cells - unusual weakness or tiredness, fainting spells, lightheadedness -increased blood sugar Side effects that usually do not require medical attention (report to your doctor or health care professional if they continue or are bothersome): -constipation -diarrhea -headache -loss of appetite -nausea, vomiting -skin rash, itching -stomach pain -water retention -weak or tired Where should I keep my medicine? This drug is given in a hospital  or clinic and will not be stored at home.  2017 Elsevier/Gold Standard (2015-06-30 15:52:57)

## 2016-05-09 ENCOUNTER — Ambulatory Visit (HOSPITAL_BASED_OUTPATIENT_CLINIC_OR_DEPARTMENT_OTHER): Payer: Medicare Other

## 2016-05-09 ENCOUNTER — Ambulatory Visit: Payer: Medicare Other

## 2016-05-09 VITALS — BP 135/69 | HR 96 | Temp 97.0°F | Resp 20

## 2016-05-09 DIAGNOSIS — D4621 Refractory anemia with excess of blasts 1: Secondary | ICD-10-CM

## 2016-05-09 DIAGNOSIS — Z5111 Encounter for antineoplastic chemotherapy: Secondary | ICD-10-CM

## 2016-05-09 DIAGNOSIS — D4620 Refractory anemia with excess of blasts, unspecified: Secondary | ICD-10-CM

## 2016-05-09 MED ORDER — HEPARIN SOD (PORK) LOCK FLUSH 100 UNIT/ML IV SOLN
500.0000 [IU] | Freq: Once | INTRAVENOUS | Status: AC | PRN
  Administered 2016-05-09: 500 [IU]
  Filled 2016-05-09: qty 5

## 2016-05-09 MED ORDER — PROCHLORPERAZINE MALEATE 10 MG PO TABS
ORAL_TABLET | ORAL | Status: AC
  Filled 2016-05-09: qty 1

## 2016-05-09 MED ORDER — PROCHLORPERAZINE MALEATE 10 MG PO TABS
10.0000 mg | ORAL_TABLET | Freq: Once | ORAL | Status: AC
  Administered 2016-05-09: 10 mg via ORAL

## 2016-05-09 MED ORDER — SODIUM CHLORIDE 0.9% FLUSH
10.0000 mL | INTRAVENOUS | Status: DC | PRN
  Administered 2016-05-09: 10 mL
  Filled 2016-05-09: qty 10

## 2016-05-09 MED ORDER — SODIUM CHLORIDE 0.9 % IV SOLN
20.0000 mg/m2 | Freq: Once | INTRAVENOUS | Status: AC
  Administered 2016-05-09: 45 mg via INTRAVENOUS
  Filled 2016-05-09: qty 9

## 2016-05-09 MED ORDER — SODIUM CHLORIDE 0.9 % IV SOLN
Freq: Once | INTRAVENOUS | Status: AC
  Administered 2016-05-09: 10:00:00 via INTRAVENOUS

## 2016-05-09 NOTE — Patient Instructions (Signed)
Decitabine injection for infusion What is this medicine? DECITABINE (dee SYE ta been) is a chemotherapy drug. This medicine reduces the growth of cancer cells. It is used to treat adults with myelodysplastic syndromes. COMMON BRAND NAME(S): Dacogen What should I tell my health care provider before I take this medicine? They need to know if you have any of these conditions: -infection (especially a virus infection such as chickenpox, cold sores, or herpes) -kidney disease -liver disease -an unusual or allergic reaction to decitabine, other medicines, foods, dyes, or preservatives -pregnant or trying to get pregnant -breast-feeding How should I use this medicine? This medicine is for infusion into a vein. It is administered in a hospital or clinic by a doctor or health care professional. Talk to your pediatrician regarding the use of this medicine in children. Special care may be needed. What if I miss a dose? It is important not to miss your dose. Call your doctor or health care professional if you are unable to keep an appointment. What may interact with this medicine? -vaccines Talk to your doctor or health care professional before taking any of these medicines: -aspirin -acetaminophen -ibuprofen -ketoprofen -naproxen What should I watch for while using this medicine? Visit your doctor for checks on your progress. This drug may make you feel generally unwell. This is not uncommon, as chemotherapy can affect healthy cells as well as cancer cells. Report any side effects. Continue your course of treatment even though you feel ill unless your doctor tells you to stop. In some cases, you may be given additional medicines to help with side effects. Follow all directions for their use. Call your doctor or health care professional for advice if you get a fever, chills or sore throat, or other symptoms of a cold or flu. Do not treat yourself. This drug decreases your body's ability to fight  infections. Try to avoid being around people who are sick. This medicine may increase your risk to bruise or bleed. Call your doctor or health care professional if you notice any unusual bleeding. Do not become pregnant while taking this medicine or for at least 1 month after stopping it. Women should inform their doctor if they wish to become pregnant or think they might be pregnant. Men should not father a child while taking this medicine and for at least 2 months after stopping it. There is a potential for serious side effects to an unborn child. Talk to your health care professional or pharmacist for more information. Do not breast-feed an infant while taking this medicine. What side effects may I notice from receiving this medicine? Side effects that you should report to your doctor or health care professional as soon as possible: -low blood counts - this medicine may decrease the number of white blood cells, red blood cells and platelets. You may be at increased risk for infections and bleeding. -signs of infection - fever or chills, cough, sore throat, pain or difficulty passing urine -signs of decreased platelets or bleeding - bruising, pinpoint red spots on the skin, black, tarry stools, blood in the urine -signs of decreased red blood cells - unusual weakness or tiredness, fainting spells, lightheadedness -increased blood sugar Side effects that usually do not require medical attention (report to your doctor or health care professional if they continue or are bothersome): -constipation -diarrhea -headache -loss of appetite -nausea, vomiting -skin rash, itching -stomach pain -water retention -weak or tired Where should I keep my medicine? This drug is given in a hospital  or clinic and will not be stored at home.  2017 Elsevier/Gold Standard (2015-06-30 15:52:57)

## 2016-05-10 ENCOUNTER — Ambulatory Visit (HOSPITAL_BASED_OUTPATIENT_CLINIC_OR_DEPARTMENT_OTHER): Payer: Medicare Other

## 2016-05-10 ENCOUNTER — Other Ambulatory Visit: Payer: Self-pay | Admitting: Family

## 2016-05-10 VITALS — BP 134/61 | HR 94 | Temp 97.4°F | Resp 20

## 2016-05-10 DIAGNOSIS — D4620 Refractory anemia with excess of blasts, unspecified: Secondary | ICD-10-CM

## 2016-05-10 DIAGNOSIS — F419 Anxiety disorder, unspecified: Secondary | ICD-10-CM

## 2016-05-10 DIAGNOSIS — Z5111 Encounter for antineoplastic chemotherapy: Secondary | ICD-10-CM | POA: Diagnosis not present

## 2016-05-10 DIAGNOSIS — R110 Nausea: Secondary | ICD-10-CM

## 2016-05-10 DIAGNOSIS — D4621 Refractory anemia with excess of blasts 1: Secondary | ICD-10-CM | POA: Diagnosis not present

## 2016-05-10 MED ORDER — PROCHLORPERAZINE MALEATE 10 MG PO TABS
ORAL_TABLET | ORAL | Status: AC
  Filled 2016-05-10: qty 1

## 2016-05-10 MED ORDER — SODIUM CHLORIDE 0.9% FLUSH
10.0000 mL | INTRAVENOUS | Status: DC | PRN
  Administered 2016-05-10: 10 mL
  Filled 2016-05-10: qty 10

## 2016-05-10 MED ORDER — SODIUM CHLORIDE 0.9 % IV SOLN
20.0000 mg/m2 | Freq: Once | INTRAVENOUS | Status: AC
  Administered 2016-05-10: 45 mg via INTRAVENOUS
  Filled 2016-05-10: qty 9

## 2016-05-10 MED ORDER — HEPARIN SOD (PORK) LOCK FLUSH 100 UNIT/ML IV SOLN
500.0000 [IU] | Freq: Once | INTRAVENOUS | Status: AC | PRN
  Administered 2016-05-10: 500 [IU]
  Filled 2016-05-10: qty 5

## 2016-05-10 MED ORDER — LORAZEPAM 0.5 MG PO TABS: 0.5000 mg | ORAL_TABLET | Freq: Four times a day (QID) | ORAL | 0 refills | Status: DC | PRN

## 2016-05-10 MED ORDER — PROCHLORPERAZINE MALEATE 10 MG PO TABS
10.0000 mg | ORAL_TABLET | Freq: Once | ORAL | Status: AC
  Administered 2016-05-10: 10 mg via ORAL

## 2016-05-10 MED ORDER — SODIUM CHLORIDE 0.9 % IV SOLN
Freq: Once | INTRAVENOUS | Status: AC
  Administered 2016-05-10: 10:00:00 via INTRAVENOUS

## 2016-05-10 MED FILL — LORazepam 0.5 MG TABS: 0.5 | 15 days supply | Qty: 60 | Fill #0

## 2016-05-11 ENCOUNTER — Ambulatory Visit: Payer: Medicare Other

## 2016-05-11 ENCOUNTER — Ambulatory Visit (HOSPITAL_BASED_OUTPATIENT_CLINIC_OR_DEPARTMENT_OTHER): Payer: Medicare Other

## 2016-05-11 VITALS — BP 140/78 | HR 95 | Temp 97.7°F | Resp 18

## 2016-05-11 DIAGNOSIS — D4620 Refractory anemia with excess of blasts, unspecified: Secondary | ICD-10-CM

## 2016-05-11 DIAGNOSIS — Z5111 Encounter for antineoplastic chemotherapy: Secondary | ICD-10-CM | POA: Diagnosis not present

## 2016-05-11 DIAGNOSIS — D4621 Refractory anemia with excess of blasts 1: Secondary | ICD-10-CM

## 2016-05-11 MED ORDER — PROCHLORPERAZINE MALEATE 10 MG PO TABS
10.0000 mg | ORAL_TABLET | Freq: Once | ORAL | Status: AC
  Administered 2016-05-11: 10 mg via ORAL

## 2016-05-11 MED ORDER — SODIUM CHLORIDE 0.9% FLUSH
10.0000 mL | INTRAVENOUS | Status: DC | PRN
  Administered 2016-05-11: 10 mL
  Filled 2016-05-11: qty 10

## 2016-05-11 MED ORDER — SODIUM CHLORIDE 0.9 % IV SOLN
Freq: Once | INTRAVENOUS | Status: AC
  Administered 2016-05-11: 09:00:00 via INTRAVENOUS

## 2016-05-11 MED ORDER — PROCHLORPERAZINE MALEATE 10 MG PO TABS
ORAL_TABLET | ORAL | Status: AC
  Filled 2016-05-11: qty 1

## 2016-05-11 MED ORDER — DECITABINE CHEMO INJECTION 50 MG
20.0000 mg/m2 | Freq: Once | INTRAVENOUS | Status: AC
  Administered 2016-05-11: 45 mg via INTRAVENOUS
  Filled 2016-05-11: qty 9

## 2016-05-11 MED ORDER — HEPARIN SOD (PORK) LOCK FLUSH 100 UNIT/ML IV SOLN
500.0000 [IU] | Freq: Once | INTRAVENOUS | Status: AC | PRN
  Administered 2016-05-11: 500 [IU]
  Filled 2016-05-11: qty 5

## 2016-05-11 NOTE — Patient Instructions (Signed)
Decitabine injection for infusion What is this medicine? DECITABINE (dee SYE ta been) is a chemotherapy drug. This medicine reduces the growth of cancer cells. It is used to treat adults with myelodysplastic syndromes. COMMON BRAND NAME(S): Dacogen What should I tell my health care provider before I take this medicine? They need to know if you have any of these conditions: -infection (especially a virus infection such as chickenpox, cold sores, or herpes) -kidney disease -liver disease -an unusual or allergic reaction to decitabine, other medicines, foods, dyes, or preservatives -pregnant or trying to get pregnant -breast-feeding How should I use this medicine? This medicine is for infusion into a vein. It is administered in a hospital or clinic by a doctor or health care professional. Talk to your pediatrician regarding the use of this medicine in children. Special care may be needed. What if I miss a dose? It is important not to miss your dose. Call your doctor or health care professional if you are unable to keep an appointment. What may interact with this medicine? -vaccines Talk to your doctor or health care professional before taking any of these medicines: -aspirin -acetaminophen -ibuprofen -ketoprofen -naproxen What should I watch for while using this medicine? Visit your doctor for checks on your progress. This drug may make you feel generally unwell. This is not uncommon, as chemotherapy can affect healthy cells as well as cancer cells. Report any side effects. Continue your course of treatment even though you feel ill unless your doctor tells you to stop. In some cases, you may be given additional medicines to help with side effects. Follow all directions for their use. Call your doctor or health care professional for advice if you get a fever, chills or sore throat, or other symptoms of a cold or flu. Do not treat yourself. This drug decreases your body's ability to fight  infections. Try to avoid being around people who are sick. This medicine may increase your risk to bruise or bleed. Call your doctor or health care professional if you notice any unusual bleeding. Do not become pregnant while taking this medicine or for at least 1 month after stopping it. Women should inform their doctor if they wish to become pregnant or think they might be pregnant. Men should not father a child while taking this medicine and for at least 2 months after stopping it. There is a potential for serious side effects to an unborn child. Talk to your health care professional or pharmacist for more information. Do not breast-feed an infant while taking this medicine. What side effects may I notice from receiving this medicine? Side effects that you should report to your doctor or health care professional as soon as possible: -low blood counts - this medicine may decrease the number of white blood cells, red blood cells and platelets. You may be at increased risk for infections and bleeding. -signs of infection - fever or chills, cough, sore throat, pain or difficulty passing urine -signs of decreased platelets or bleeding - bruising, pinpoint red spots on the skin, black, tarry stools, blood in the urine -signs of decreased red blood cells - unusual weakness or tiredness, fainting spells, lightheadedness -increased blood sugar Side effects that usually do not require medical attention (report to your doctor or health care professional if they continue or are bothersome): -constipation -diarrhea -headache -loss of appetite -nausea, vomiting -skin rash, itching -stomach pain -water retention -weak or tired Where should I keep my medicine? This drug is given in a hospital  or clinic and will not be stored at home.  2017 Elsevier/Gold Standard (2015-06-30 15:52:57)

## 2016-05-21 ENCOUNTER — Other Ambulatory Visit (HOSPITAL_BASED_OUTPATIENT_CLINIC_OR_DEPARTMENT_OTHER): Payer: Medicare Other

## 2016-05-21 ENCOUNTER — Ambulatory Visit (HOSPITAL_BASED_OUTPATIENT_CLINIC_OR_DEPARTMENT_OTHER): Payer: Medicare Other

## 2016-05-21 ENCOUNTER — Other Ambulatory Visit: Payer: Self-pay | Admitting: *Deleted

## 2016-05-21 VITALS — BP 151/75 | HR 88 | Temp 97.6°F | Resp 20

## 2016-05-21 DIAGNOSIS — F064 Anxiety disorder due to known physiological condition: Secondary | ICD-10-CM

## 2016-05-21 DIAGNOSIS — Z452 Encounter for adjustment and management of vascular access device: Secondary | ICD-10-CM | POA: Diagnosis not present

## 2016-05-21 DIAGNOSIS — D4620 Refractory anemia with excess of blasts, unspecified: Secondary | ICD-10-CM

## 2016-05-21 DIAGNOSIS — D4621 Refractory anemia with excess of blasts 1: Secondary | ICD-10-CM

## 2016-05-21 LAB — CMP (CANCER CENTER ONLY)
ALBUMIN: 3.2 g/dL — AB (ref 3.3–5.5)
ALT(SGPT): 24 U/L (ref 10–47)
AST: 24 U/L (ref 11–38)
Alkaline Phosphatase: 44 U/L (ref 26–84)
BILIRUBIN TOTAL: 0.5 mg/dL (ref 0.20–1.60)
BUN: 19 mg/dL (ref 7–22)
CO2: 27 meq/L (ref 18–33)
CREATININE: 1.2 mg/dL (ref 0.6–1.2)
Calcium: 9.3 mg/dL (ref 8.0–10.3)
Chloride: 101 mEq/L (ref 98–108)
Glucose, Bld: 101 mg/dL (ref 73–118)
Potassium: 3.7 mEq/L (ref 3.3–4.7)
Sodium: 138 mEq/L (ref 128–145)
TOTAL PROTEIN: 6.4 g/dL (ref 6.4–8.1)

## 2016-05-21 LAB — CBC WITH DIFFERENTIAL (CANCER CENTER ONLY)
BASO#: 0 10*3/uL (ref 0.0–0.2)
BASO%: 0.6 % (ref 0.0–2.0)
EOS%: 2.9 % (ref 0.0–7.0)
Eosinophils Absolute: 0.2 10*3/uL (ref 0.0–0.5)
HCT: 34.9 % — ABNORMAL LOW (ref 38.7–49.9)
HEMOGLOBIN: 11.4 g/dL — AB (ref 13.0–17.1)
LYMPH#: 0.7 10*3/uL — ABNORMAL LOW (ref 0.9–3.3)
LYMPH%: 13.7 % — ABNORMAL LOW (ref 14.0–48.0)
MCH: 33 pg (ref 28.0–33.4)
MCHC: 32.7 g/dL (ref 32.0–35.9)
MCV: 101 fL — ABNORMAL HIGH (ref 82–98)
MONO#: 0.3 10*3/uL (ref 0.1–0.9)
MONO%: 6.3 % (ref 0.0–13.0)
NEUT%: 76.5 % (ref 40.0–80.0)
NEUTROS ABS: 4 10*3/uL (ref 1.5–6.5)
PLATELETS: 98 10*3/uL — AB (ref 145–400)
RBC: 3.45 10*6/uL — AB (ref 4.20–5.70)
RDW: 16.9 % — ABNORMAL HIGH (ref 11.1–15.7)
WBC: 5.2 10*3/uL (ref 4.0–10.0)

## 2016-05-21 LAB — TECHNOLOGIST REVIEW CHCC SATELLITE

## 2016-05-21 MED ORDER — SODIUM CHLORIDE 0.9% FLUSH
10.0000 mL | INTRAVENOUS | Status: DC | PRN
  Administered 2016-05-21: 10 mL via INTRAVENOUS
  Filled 2016-05-21: qty 10

## 2016-05-21 MED ORDER — HEPARIN SOD (PORK) LOCK FLUSH 100 UNIT/ML IV SOLN
500.0000 [IU] | Freq: Once | INTRAVENOUS | Status: AC
  Administered 2016-05-21: 500 [IU] via INTRAVENOUS
  Filled 2016-05-21: qty 5

## 2016-05-21 MED ORDER — PREDNISONE 20 MG PO TABS: 20.0000 mg | ORAL_TABLET | Freq: Every day | ORAL | 6 refills | Status: DC

## 2016-05-21 MED FILL — predniSONE 20 MG TABS: 20 | 30 days supply | Qty: 30 | Fill #0 | Status: TO

## 2016-05-21 NOTE — Patient Instructions (Signed)

## 2016-05-26 ENCOUNTER — Other Ambulatory Visit: Payer: Self-pay | Admitting: Hematology & Oncology

## 2016-05-26 DIAGNOSIS — C9201 Acute myeloblastic leukemia, in remission: Secondary | ICD-10-CM

## 2016-06-05 ENCOUNTER — Encounter (HOSPITAL_COMMUNITY): Payer: Self-pay

## 2016-06-05 ENCOUNTER — Ambulatory Visit (HOSPITAL_COMMUNITY)
Admission: RE | Admit: 2016-06-05 | Discharge: 2016-06-05 | Disposition: A | Discharge status: Home / Self Care | Payer: Medicare Other | Source: Ambulatory Visit | Attending: Hematology & Oncology | Admitting: Hematology & Oncology

## 2016-06-05 DIAGNOSIS — D469 Myelodysplastic syndrome, unspecified: Secondary | ICD-10-CM | POA: Diagnosis not present

## 2016-06-05 DIAGNOSIS — D4621 Refractory anemia with excess of blasts 1: Secondary | ICD-10-CM

## 2016-06-05 DIAGNOSIS — C9201 Acute myeloblastic leukemia, in remission: Secondary | ICD-10-CM

## 2016-06-05 LAB — CBC
HCT: 36.7 % — ABNORMAL LOW (ref 39.0–52.0)
HEMOGLOBIN: 11.9 g/dL — AB (ref 13.0–17.0)
MCH: 32.2 pg (ref 26.0–34.0)
MCHC: 32.4 g/dL (ref 30.0–36.0)
MCV: 99.2 fL (ref 78.0–100.0)
Platelets: 249 10*3/uL (ref 150–400)
RBC: 3.7 MIL/uL — ABNORMAL LOW (ref 4.22–5.81)
RDW: 18.1 % — AB (ref 11.5–15.5)
WBC: 3.6 10*3/uL — ABNORMAL LOW (ref 4.0–10.5)

## 2016-06-05 LAB — BONE MARROW EXAM

## 2016-06-05 MED ORDER — MEPERIDINE HCL 50 MG/ML IJ SOLN
50.0000 mg | Freq: Once | INTRAMUSCULAR | Status: DC
  Filled 2016-06-05: qty 1

## 2016-06-05 MED ORDER — MIDAZOLAM HCL 5 MG/5ML IJ SOLN
INTRAMUSCULAR | Status: AC | PRN
  Administered 2016-06-05: 2.5 mg via INTRAVENOUS

## 2016-06-05 MED ORDER — MEPERIDINE HCL 25 MG/ML IJ SOLN
INTRAMUSCULAR | Status: AC | PRN
Start: 2016-06-05 — End: 2016-06-05
  Administered 2016-06-05: 25 mg via INTRAVENOUS

## 2016-06-05 MED ORDER — SODIUM CHLORIDE 0.9 % IV SOLN
Freq: Once | INTRAVENOUS | Status: AC
  Administered 2016-06-05: 07:00:00 via INTRAVENOUS

## 2016-06-05 MED ORDER — HEPARIN SOD (PORK) LOCK FLUSH 100 UNIT/ML IV SOLN
500.0000 [IU] | INTRAVENOUS | Status: AC | PRN
  Administered 2016-06-05: 500 [IU]
  Filled 2016-06-05: qty 5

## 2016-06-05 MED ORDER — MIDAZOLAM HCL 5 MG/ML IJ SOLN
10.0000 mg | Freq: Once | INTRAMUSCULAR | Status: DC
  Filled 2016-06-05: qty 2

## 2016-06-05 NOTE — Discharge Instructions (Signed)
Moderate Conscious Sedation, Adult, Care After °These instructions provide you with information about caring for yourself after your procedure. Your health care provider may also give you more specific instructions. Your treatment has been planned according to current medical practices, but problems sometimes occur. Call your health care provider if you have any problems or questions after your procedure. °What can I expect after the procedure? °After your procedure, it is common: °· To feel sleepy for several hours. °· To feel clumsy and have poor balance for several hours. °· To have poor judgment for several hours. °· To vomit if you eat too soon. °Follow these instructions at home: °For at least 24 hours after the procedure:  ° °· Do not: °¨ Participate in activities where you could fall or become injured. °¨ Drive. °¨ Use heavy machinery. °¨ Drink alcohol. °¨ Take sleeping pills or medicines that cause drowsiness. °¨ Make important decisions or sign legal documents. °¨ Take care of children on your own. °· Rest. °Eating and drinking  °· Follow the diet recommended by your health care provider. °· If you vomit: °¨ Drink water, juice, or soup when you can drink without vomiting. °¨ Make sure you have little or no nausea before eating solid foods. °General instructions  °· Have a responsible adult stay with you until you are awake and alert. °· Take over-the-counter and prescription medicines only as told by your health care provider. °· If you smoke, do not smoke without supervision. °· Keep all follow-up visits as told by your health care provider. This is important. °Contact a health care provider if: °· You keep feeling nauseous or you keep vomiting. °· You feel light-headed. °· You develop a rash. °· You have a fever. °Get help right away if: °· You have trouble breathing. °This information is not intended to replace advice given to you by your health care provider. Make sure you discuss any questions you  have with your health care provider. °Document Released: 03/18/2013 Document Revised: 10/31/2015 Document Reviewed: 09/17/2015 °Elsevier Interactive Patient Education © 2017 Elsevier Inc[employer] ° ° °Bone Marrow Aspiration and Bone Marrow Biopsy, Adult, Care After °This sheet gives you information about how to care for yourself after your procedure. Your health care provider may also give you more specific instructions. If you have problems or questions, contact your health care provider. °What can I expect after the procedure? °After the procedure, it is common to have: °· Mild pain and tenderness. °· Swelling. °· Bruising. °Follow these instructions at home: °· Take over-the-counter or prescription medicines only as told by your health care provider. °· Do not take baths, swim, or use a hot tub until your health care provider approves. Ask if you can take a shower or have a sponge bath. °· Follow instructions from your health care provider about how to take care of the puncture site. Make sure you: °¨ Wash your hands with soap and water before you change your bandage (dressing). If soap and water are not available, use hand sanitizer. °¨ Change your dressing as told by your health care provider. °· Check your puncture site every day for signs of infection. Check for: °¨ More redness, swelling, or pain. °¨ More fluid or blood. °¨ Warmth. °¨ Pus or a bad smell. °· Return to your normal activities as told by your health care provider. Ask your health care provider what activities are safe for you. °· Do not drive for 24 hours if you were given a medicine to help   you relax (sedative). °· Keep all follow-up visits as told by your health care provider. This is important. °Contact a health care provider if: °· You have more redness, swelling, or pain around the puncture site. °· You have more fluid or blood coming from the puncture site. °· Your puncture site feels warm to the touch. °· You have pus or a bad smell coming from  the puncture site. °· You have a fever. °· Your pain is not controlled with medicine. °This information is not intended to replace advice given to you by your health care provider. Make sure you discuss any questions you have with your health care provider. °Document Released: 12/15/2004 Document Revised: 12/16/2015 Document Reviewed: 11/09/2015 °Elsevier Interactive Patient Education © 2017 Elsevier Inc. ° °

## 2016-06-05 NOTE — Procedures (Signed)
This is a bone marrow biopsy and aspirate procedure note for [name]. He was brought to the short stay unit at [facility].  His Port-A-Cath was accessed without difficulties.  We did the appropriate timeout procedure at 7:50 AM.  His Mallimpati score is 2. His ASA class is 2.  We then placed him on his right side. He got a total of 2.5 mg of Versed and 25 mg of Demerol for IV sedation.  The left posteriorly crest region was prepped and draped in sterile fashion. 4 mL of 1% lidocaine was indicated on the skin down to the periosteum.  I then made an incision to the skin with a scalpel. With the combination aspirate and biopsy needle, I obtained to aspirates without difficulty. I then obtained a good bone marrow biopsy core.  I cleaned and dressed the procedure site sterilely. There were no complications. He tolerated the procedure well.  I went to get his wife. I talked her about the procedure. I brought her back to the procedure room.  As all his, had excellent help from Bethena Roys the short stay nurse and Butch Penny the bone marrow tech.  Lattie Haw, MD  Oswaldo Milian 7:14

## 2016-06-12 ENCOUNTER — Ambulatory Visit (HOSPITAL_BASED_OUTPATIENT_CLINIC_OR_DEPARTMENT_OTHER): Payer: Medicare Other | Admitting: Hematology & Oncology

## 2016-06-12 ENCOUNTER — Ambulatory Visit: Payer: Medicare Other

## 2016-06-12 ENCOUNTER — Ambulatory Visit (HOSPITAL_BASED_OUTPATIENT_CLINIC_OR_DEPARTMENT_OTHER): Payer: Medicare Other

## 2016-06-12 ENCOUNTER — Other Ambulatory Visit (HOSPITAL_BASED_OUTPATIENT_CLINIC_OR_DEPARTMENT_OTHER): Payer: Medicare Other

## 2016-06-12 VITALS — BP 118/68 | HR 84 | Temp 97.6°F | Resp 20

## 2016-06-12 DIAGNOSIS — Z5111 Encounter for antineoplastic chemotherapy: Secondary | ICD-10-CM

## 2016-06-12 DIAGNOSIS — D4620 Refractory anemia with excess of blasts, unspecified: Secondary | ICD-10-CM

## 2016-06-12 DIAGNOSIS — D4622 Refractory anemia with excess of blasts 2: Secondary | ICD-10-CM

## 2016-06-12 LAB — CMP (CANCER CENTER ONLY)
ALBUMIN: 4.1 g/dL (ref 3.3–5.5)
ALT: 25 U/L (ref 10–47)
AST: 24 U/L (ref 11–38)
Alkaline Phosphatase: 41 U/L (ref 26–84)
BILIRUBIN TOTAL: 0.6 mg/dL (ref 0.20–1.60)
BUN, Bld: 16 mg/dL (ref 7–22)
CO2: 28 mEq/L (ref 18–33)
Calcium: 9.5 mg/dL (ref 8.0–10.3)
Chloride: 100 mEq/L (ref 98–108)
Creat: 1.7 mg/dl — ABNORMAL HIGH (ref 0.6–1.2)
Glucose, Bld: 96 mg/dL (ref 73–118)
Potassium: 4.1 mEq/L (ref 3.3–4.7)
SODIUM: 139 meq/L (ref 128–145)
TOTAL PROTEIN: 7 g/dL (ref 6.4–8.1)

## 2016-06-12 LAB — TISSUE HYBRIDIZATION (BONE MARROW)-NCBH

## 2016-06-12 LAB — CBC WITH DIFFERENTIAL (CANCER CENTER ONLY)
BASO#: 0 10*3/uL (ref 0.0–0.2)
BASO%: 0.6 % (ref 0.0–2.0)
EOS ABS: 0 10*3/uL (ref 0.0–0.5)
EOS%: 0.2 % (ref 0.0–7.0)
HCT: 37.7 % — ABNORMAL LOW (ref 38.7–49.9)
HEMOGLOBIN: 12.2 g/dL — AB (ref 13.0–17.1)
LYMPH#: 0.6 10*3/uL — ABNORMAL LOW (ref 0.9–3.3)
LYMPH%: 9.4 % — ABNORMAL LOW (ref 14.0–48.0)
MCH: 32.4 pg (ref 28.0–33.4)
MCHC: 32.4 g/dL (ref 32.0–35.9)
MCV: 100 fL — ABNORMAL HIGH (ref 82–98)
MONO#: 0.3 10*3/uL (ref 0.1–0.9)
MONO%: 5.4 % (ref 0.0–13.0)
NEUT%: 84.4 % — ABNORMAL HIGH (ref 40.0–80.0)
NEUTROS ABS: 5.3 10*3/uL (ref 1.5–6.5)
Platelets: 314 10*3/uL (ref 145–400)
RBC: 3.76 10*6/uL — AB (ref 4.20–5.70)
RDW: 17.7 % — ABNORMAL HIGH (ref 11.1–15.7)
WBC: 6.3 10*3/uL (ref 4.0–10.0)

## 2016-06-12 LAB — CHROMOSOME ANALYSIS, BONE MARROW

## 2016-06-12 LAB — CHCC SATELLITE - SMEAR

## 2016-06-12 MED ORDER — SODIUM CHLORIDE 0.9% FLUSH
10.0000 mL | INTRAVENOUS | Status: DC | PRN
  Administered 2016-06-12: 10 mL
  Filled 2016-06-12: qty 10

## 2016-06-12 MED ORDER — SODIUM CHLORIDE 0.9 % IV SOLN
20.0000 mg/m2 | Freq: Once | INTRAVENOUS | Status: AC
  Administered 2016-06-12: 45 mg via INTRAVENOUS
  Filled 2016-06-12: qty 9

## 2016-06-12 MED ORDER — SODIUM CHLORIDE 0.9 % IV SOLN
Freq: Once | INTRAVENOUS | Status: AC
  Administered 2016-06-12: 12:00:00 via INTRAVENOUS

## 2016-06-12 MED ORDER — ORPHENADRINE CITRATE ER 100 MG PO TB12: 100.0000 mg | ORAL_TABLET | Freq: Two times a day (BID) | ORAL | 1 refills | Status: DC | PRN

## 2016-06-12 MED ORDER — PROCHLORPERAZINE MALEATE 10 MG PO TABS
ORAL_TABLET | ORAL | Status: AC
  Filled 2016-06-12: qty 1

## 2016-06-12 MED ORDER — PROCHLORPERAZINE MALEATE 10 MG PO TABS
10.0000 mg | ORAL_TABLET | Freq: Once | ORAL | Status: AC
  Administered 2016-06-12: 10 mg via ORAL

## 2016-06-12 MED ORDER — HEPARIN SOD (PORK) LOCK FLUSH 100 UNIT/ML IV SOLN
500.0000 [IU] | Freq: Once | INTRAVENOUS | Status: AC | PRN
  Administered 2016-06-12: 500 [IU]
  Filled 2016-06-12: qty 5

## 2016-06-12 MED ORDER — METAXALONE 800 MG PO TABS: 800.0000 mg | ORAL_TABLET | Freq: Three times a day (TID) | ORAL | 2 refills | Status: DC | PRN

## 2016-06-12 NOTE — Patient Instructions (Signed)
Decitabine injection for infusion What is this medicine? DECITABINE (dee SYE ta been) is a chemotherapy drug. This medicine reduces the growth of cancer cells. It is used to treat adults with myelodysplastic syndromes. COMMON BRAND NAME(S): Dacogen What should I tell my health care provider before I take this medicine? They need to know if you have any of these conditions: -infection (especially a virus infection such as chickenpox, cold sores, or herpes) -kidney disease -liver disease -an unusual or allergic reaction to decitabine, other medicines, foods, dyes, or preservatives -pregnant or trying to get pregnant -breast-feeding How should I use this medicine? This medicine is for infusion into a vein. It is administered in a hospital or clinic by a doctor or health care professional. Talk to your pediatrician regarding the use of this medicine in children. Special care may be needed. What if I miss a dose? It is important not to miss your dose. Call your doctor or health care professional if you are unable to keep an appointment. What may interact with this medicine? -vaccines Talk to your doctor or health care professional before taking any of these medicines: -aspirin -acetaminophen -ibuprofen -ketoprofen -naproxen What should I watch for while using this medicine? Visit your doctor for checks on your progress. This drug may make you feel generally unwell. This is not uncommon, as chemotherapy can affect healthy cells as well as cancer cells. Report any side effects. Continue your course of treatment even though you feel ill unless your doctor tells you to stop. In some cases, you may be given additional medicines to help with side effects. Follow all directions for their use. Call your doctor or health care professional for advice if you get a fever, chills or sore throat, or other symptoms of a cold or flu. Do not treat yourself. This drug decreases your body's ability to fight  infections. Try to avoid being around people who are sick. This medicine may increase your risk to bruise or bleed. Call your doctor or health care professional if you notice any unusual bleeding. Do not become pregnant while taking this medicine or for at least 1 month after stopping it. Women should inform their doctor if they wish to become pregnant or think they might be pregnant. Men should not father a child while taking this medicine and for at least 2 months after stopping it. There is a potential for serious side effects to an unborn child. Talk to your health care professional or pharmacist for more information. Do not breast-feed an infant while taking this medicine. What side effects may I notice from receiving this medicine? Side effects that you should report to your doctor or health care professional as soon as possible: -low blood counts - this medicine may decrease the number of white blood cells, red blood cells and platelets. You may be at increased risk for infections and bleeding. -signs of infection - fever or chills, cough, sore throat, pain or difficulty passing urine -signs of decreased platelets or bleeding - bruising, pinpoint red spots on the skin, black, tarry stools, blood in the urine -signs of decreased red blood cells - unusual weakness or tiredness, fainting spells, lightheadedness -increased blood sugar Side effects that usually do not require medical attention (report to your doctor or health care professional if they continue or are bothersome): -constipation -diarrhea -headache -loss of appetite -nausea, vomiting -skin rash, itching -stomach pain -water retention -weak or tired Where should I keep my medicine? This drug is given in a hospital  or clinic and will not be stored at home.  2017 Elsevier/Gold Standard (2015-06-30 15:52:57)

## 2016-06-12 NOTE — Progress Notes (Signed)
12:25 PM OK to treat with Creatinine 1.7.

## 2016-06-12 NOTE — Progress Notes (Signed)
Hematology and Oncology Follow Up Visit  BRONSYN SHAPPELL [id number] 08/29/1940 76 y.o. 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 Surratt is here today with his wife for a follow-up.we did do a bone marrow biopsy on him. This was after his 6th cycle of treatment. The bone marrow biopsy was done on October 17. The bone marrow  report ([id number]) showed a normocellular marrow with dyspoietic changes. There is no increase in blasts.  As such, he clearly is responding.  We did do cytogenetics. The cytogenetics are pending. Hopefully, we have eradicated the Trisomy 11 clone.  His transfusion requirements have decreased. I do not think that he has been transfused now for about 5 months or so.  His last iron studies done back in September showed a ferritin of 2561 and a iron saturation of 37%.   Thankfully, his wife is doing better. She had GI bleeding from a ulcer. Thankfully, she did not require surgery.  Overall, his performance status is ECOG 1.    Medications:  Allergies as of 06/12/2016   No Known Allergies     Medication List       Accurate as of 06/12/16 11:20 AM. Always use your most recent med list.          bimatoprost 0.01 % Soln Commonly known as:  LUMIGAN Place 1 drop into both eyes at bedtime.   budesonide-formoterol 160-4.5 MCG/ACT inhaler Commonly known as:  SYMBICORT Inhale 2 puffs into the lungs 2 (two) times daily.   buPROPion 150 MG 24 hr tablet Commonly known as:  WELLBUTRIN XL Take 150 mg by mouth daily.   clopidogrel 75 MG tablet Commonly known as:  PLAVIX Take 75 mg by mouth daily.   co-enzyme Q-10 30 MG capsule Take 100 mg by mouth daily.   Cyanocobalamin 2500  MCG Tabs Take 5,000 mcg by mouth daily.   Deferasirox 360 MG Tabs Commonly known as:  JADENU Take 3 capsules by mouth daily.   fish oil-omega-3 fatty acids 1000 MG capsule Take 1 g by mouth daily.   glucosamine-chondroitin 500-400 MG tablet Take 1 tablet by mouth daily.   LORazepam 0.5 MG tablet Commonly known as:  ATIVAN Take 1 tablet (0.5 mg total) by mouth every 6 (six) hours as needed (Nausea or vomiting).   magnesium oxide 400 MG tablet Commonly known as:  MAG-OX Take 400 mg by mouth daily.   pantoprazole 40 MG tablet Commonly known as:  PROTONIX Take 40 mg by mouth daily.   predniSONE 20 MG tablet Commonly known as:  DELTASONE Take 1 tablet (20 mg total) by mouth daily with breakfast.   PROAIR RESPICLICK 127 (90 Base) MCG/ACT Aepb Generic drug:  Albuterol Sulfate Inhale 2 puffs into the lungs every 6 (six) hours as needed.   prochlorperazine 10 MG tablet Commonly known as:  COMPAZINE Take 1 tablet (10 mg total) by mouth every 6 (six) hours as needed (Nausea or vomiting).   ranitidine 300 MG tablet Commonly known as:  ZANTAC Take 300 mg by mouth at bedtime.   sertraline 100 MG tablet Commonly known as:  ZOLOFT Take 100 mg by mouth daily.   SIMBRINZA 1-0.2 % Susp  Generic drug:  Brinzolamide-Brimonidine   simvastatin 40 MG tablet Commonly known as:  ZOCOR Take 40 mg by mouth every other day.   tiotropium 18 MCG inhalation capsule Commonly known as:  SPIRIVA Place 18 mcg into inhaler and inhale daily.   Vitamin D3 5000 units Tabs Take by mouth daily.       Allergies: No Known Allergies  Past Medical History, Surgical history, Social history, and Family History were reviewed and updated.  Review of Systems: All other 10 point review of systems is negative.   Physical Exam:  vitals were not taken for this visit.  Wt Readings from Last 3 Encounters:  06/05/16 206 lb (93.4 kg)  05/07/16 209 lb (94.8 kg)  03/30/16 206 lb (93.4 kg)     Well-developed and well-nourished white male in no obvious distress. Head and neck exam shows no ocular or oral lesions. He has no palpable cervical or supraclavicular lymph nodes. Lungs are clear. Cardiac exam regular rate and rhythm with no murmurs, rubs or bruits. Abdomen is soft. He has good bowel sounds. There is no fluid wave. There is no palpable liver or spleen tip. Back exam shows no tenderness over the spine, ribs or hips. Extremities shows no clubbing, cyanosis or edema. Skin exam shows no rashes, ecchymoses or petechia. Neurological exam shows no focal neurological deficits. oriented, appropriate affect  Lab Results  Component Value Date   WBC 3.6 (L) 06/05/2016   HGB 11.9 (L) 06/05/2016   HCT 36.7 (L) 06/05/2016   MCV 99.2 06/05/2016   PLT 249 06/05/2016   Lab Results  Component Value Date   FERRITIN 2,561 (H) 05/07/2016   IRON 105 05/07/2016   TIBC 284 05/07/2016   UIBC 178 05/07/2016   IRONPCTSAT 37 05/07/2016   Lab Results  Component Value Date   RETICCTPCT 1.7 05/30/2015   RBC 3.70 (L) 06/05/2016   RETICCTABS 39.4 05/30/2015   No results found for: KPAFRELGTCHN, LAMBDASER, KAPLAMBRATIO No results found for: Osborne Casco Lab Results  Component Value Date   TOTALPROTELP 7.3 01/01/2014   ALBUMINELP 55.1 (L) 01/01/2014   A1GS 6.9 (H) 01/01/2014   A2GS 8.9 01/01/2014   BETS 7.9 (H) 01/01/2014   BETA2SER 6.7 (H) 01/01/2014   GAMS 14.5 01/01/2014   MSPIKE NOT DET 01/01/2014   SPEI * 01/01/2014     Chemistry      Component Value Date/Time   NA 138 05/21/2016 0927   NA 140 04/16/2016 0930   K 3.7 05/21/2016 0927   K 3.9 04/16/2016 0930   CL 101 05/21/2016 0927   CO2 27 05/21/2016 0927   CO2 24 04/16/2016 0930   BUN 19 05/21/2016 0927   BUN 20.5 04/16/2016 0930   CREATININE 1.2 05/21/2016 0927   CREATININE 1.2 04/16/2016 0930      Component Value Date/Time   CALCIUM 9.3 05/21/2016 0927   CALCIUM 8.9 04/16/2016 0930   ALKPHOS 44  05/21/2016 0927   ALKPHOS 54 04/16/2016 0930   AST 24 05/21/2016 0927   AST 16 04/16/2016 0930   ALT 24 05/21/2016 0927   ALT 21 04/16/2016 0930   BILITOT 0.50 05/21/2016 0927   BILITOT 0.27 04/16/2016 0930     Impression and Plan: Mr. 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  His bone marrow biopsy in June showed refractory anemia with excess blasts - 2.   He is still responding quite nicely. As such, I think we can go ahead and start to bring out the  intervals that he gets treated. I think we can give him 4 week breaks in between cycles. As such, each cycle will be 5 weeks. I think this would be very reasonable. Hopefully will help improve his quality of life and performance status.   With Mr. Sachse, it is obvious that his response will be dictated by his transfusion requirements. He has not been transfused for almost 5 months.   I am just glad that he has done so well.  We will proceed with his sixth cycle of treatment today.  I will plan to see him back in about 5 weeks. I spent about 25-30 minutes with he and his wife. I went over the bone marrow report and we discussed our new recommendations. He and his wife agree.  Volanda Napoleon, MD 1/2/201811:20 AM  I

## 2016-06-13 ENCOUNTER — Ambulatory Visit (HOSPITAL_BASED_OUTPATIENT_CLINIC_OR_DEPARTMENT_OTHER): Payer: Medicare Other

## 2016-06-13 DIAGNOSIS — Z5111 Encounter for antineoplastic chemotherapy: Secondary | ICD-10-CM | POA: Diagnosis not present

## 2016-06-13 DIAGNOSIS — D4622 Refractory anemia with excess of blasts 2: Secondary | ICD-10-CM

## 2016-06-13 DIAGNOSIS — D4620 Refractory anemia with excess of blasts, unspecified: Secondary | ICD-10-CM

## 2016-06-13 LAB — IRON AND TIBC
%SAT: 27 % (ref 20–55)
IRON: 79 ug/dL (ref 42–163)
TIBC: 288 ug/dL (ref 202–409)
UIBC: 209 ug/dL (ref 117–376)

## 2016-06-13 LAB — FERRITIN

## 2016-06-13 MED ORDER — SODIUM CHLORIDE 0.9% FLUSH
10.0000 mL | INTRAVENOUS | Status: DC | PRN
  Administered 2016-06-13: 10 mL
  Filled 2016-06-13: qty 10

## 2016-06-13 MED ORDER — SODIUM CHLORIDE 0.9 % IV SOLN
Freq: Once | INTRAVENOUS | Status: AC
  Administered 2016-06-13: 10:00:00 via INTRAVENOUS

## 2016-06-13 MED ORDER — PROCHLORPERAZINE MALEATE 10 MG PO TABS
10.0000 mg | ORAL_TABLET | Freq: Once | ORAL | Status: AC
  Administered 2016-06-13: 10 mg via ORAL

## 2016-06-13 MED ORDER — HEPARIN SOD (PORK) LOCK FLUSH 100 UNIT/ML IV SOLN
500.0000 [IU] | Freq: Once | INTRAVENOUS | Status: AC | PRN
  Administered 2016-06-13: 500 [IU]
  Filled 2016-06-13: qty 5

## 2016-06-13 MED ORDER — PROCHLORPERAZINE MALEATE 10 MG PO TABS
ORAL_TABLET | ORAL | Status: AC
  Filled 2016-06-13: qty 1

## 2016-06-13 MED ORDER — SODIUM CHLORIDE 0.9 % IV SOLN
20.0000 mg/m2 | Freq: Once | INTRAVENOUS | Status: AC
  Administered 2016-06-13: 45 mg via INTRAVENOUS
  Filled 2016-06-13: qty 9

## 2016-06-14 ENCOUNTER — Ambulatory Visit (HOSPITAL_BASED_OUTPATIENT_CLINIC_OR_DEPARTMENT_OTHER): Payer: Medicare Other

## 2016-06-14 VITALS — BP 123/59 | HR 88 | Resp 19

## 2016-06-14 DIAGNOSIS — D4622 Refractory anemia with excess of blasts 2: Secondary | ICD-10-CM

## 2016-06-14 DIAGNOSIS — Z5111 Encounter for antineoplastic chemotherapy: Secondary | ICD-10-CM | POA: Diagnosis not present

## 2016-06-14 DIAGNOSIS — D4620 Refractory anemia with excess of blasts, unspecified: Secondary | ICD-10-CM

## 2016-06-14 MED ORDER — PROCHLORPERAZINE MALEATE 10 MG PO TABS
ORAL_TABLET | ORAL | Status: AC
  Filled 2016-06-14: qty 1

## 2016-06-14 MED ORDER — DECITABINE CHEMO INJECTION 50 MG
20.0000 mg/m2 | Freq: Once | INTRAVENOUS | Status: AC
  Administered 2016-06-14: 45 mg via INTRAVENOUS
  Filled 2016-06-14: qty 9

## 2016-06-14 MED ORDER — SODIUM CHLORIDE 0.9 % IV SOLN
Freq: Once | INTRAVENOUS | Status: AC
  Administered 2016-06-14: 11:00:00 via INTRAVENOUS

## 2016-06-14 MED ORDER — SODIUM CHLORIDE 0.9% FLUSH
10.0000 mL | INTRAVENOUS | Status: DC | PRN
  Administered 2016-06-14: 10 mL
  Filled 2016-06-14: qty 10

## 2016-06-14 MED ORDER — PROCHLORPERAZINE MALEATE 10 MG PO TABS
10.0000 mg | ORAL_TABLET | Freq: Once | ORAL | Status: AC
  Administered 2016-06-14: 10 mg via ORAL

## 2016-06-14 MED ORDER — HEPARIN SOD (PORK) LOCK FLUSH 100 UNIT/ML IV SOLN
500.0000 [IU] | Freq: Once | INTRAVENOUS | Status: AC | PRN
  Administered 2016-06-14: 500 [IU]
  Filled 2016-06-14: qty 5

## 2016-06-14 MED FILL — METAXALONE 800 MG TABLET: 800 | 20 days supply | Qty: 60 | Fill #0

## 2016-06-15 ENCOUNTER — Ambulatory Visit (HOSPITAL_BASED_OUTPATIENT_CLINIC_OR_DEPARTMENT_OTHER): Payer: Medicare Other

## 2016-06-15 VITALS — BP 123/93 | HR 83 | Temp 98.0°F | Resp 18

## 2016-06-15 DIAGNOSIS — D4620 Refractory anemia with excess of blasts, unspecified: Secondary | ICD-10-CM

## 2016-06-15 DIAGNOSIS — D4622 Refractory anemia with excess of blasts 2: Secondary | ICD-10-CM | POA: Diagnosis not present

## 2016-06-15 DIAGNOSIS — Z5111 Encounter for antineoplastic chemotherapy: Secondary | ICD-10-CM | POA: Diagnosis not present

## 2016-06-15 MED ORDER — HEPARIN SOD (PORK) LOCK FLUSH 100 UNIT/ML IV SOLN
500.0000 [IU] | Freq: Once | INTRAVENOUS | Status: AC | PRN
  Administered 2016-06-15: 500 [IU]
  Filled 2016-06-15: qty 5

## 2016-06-15 MED ORDER — PROCHLORPERAZINE MALEATE 10 MG PO TABS
ORAL_TABLET | ORAL | Status: AC
  Filled 2016-06-15: qty 1

## 2016-06-15 MED ORDER — SODIUM CHLORIDE 0.9 % IV SOLN
Freq: Once | INTRAVENOUS | Status: AC
  Administered 2016-06-15: 10:00:00 via INTRAVENOUS

## 2016-06-15 MED ORDER — DECITABINE CHEMO INJECTION 50 MG
20.0000 mg/m2 | Freq: Once | INTRAVENOUS | Status: AC
  Administered 2016-06-15: 45 mg via INTRAVENOUS
  Filled 2016-06-15: qty 9

## 2016-06-15 MED ORDER — SODIUM CHLORIDE 0.9% FLUSH
10.0000 mL | INTRAVENOUS | Status: DC | PRN
  Administered 2016-06-15: 10 mL
  Filled 2016-06-15: qty 10

## 2016-06-15 MED ORDER — PROCHLORPERAZINE MALEATE 10 MG PO TABS
10.0000 mg | ORAL_TABLET | Freq: Once | ORAL | Status: AC
  Administered 2016-06-15: 10 mg via ORAL

## 2016-06-18 ENCOUNTER — Ambulatory Visit (HOSPITAL_BASED_OUTPATIENT_CLINIC_OR_DEPARTMENT_OTHER): Payer: Medicare Other

## 2016-06-18 VITALS — BP 127/66 | HR 87 | Resp 18

## 2016-06-18 DIAGNOSIS — Z5111 Encounter for antineoplastic chemotherapy: Secondary | ICD-10-CM

## 2016-06-18 DIAGNOSIS — D4622 Refractory anemia with excess of blasts 2: Secondary | ICD-10-CM | POA: Diagnosis not present

## 2016-06-18 DIAGNOSIS — D4620 Refractory anemia with excess of blasts, unspecified: Secondary | ICD-10-CM

## 2016-06-18 MED ORDER — DECITABINE CHEMO INJECTION 50 MG
20.0000 mg/m2 | Freq: Once | INTRAVENOUS | Status: AC
  Administered 2016-06-18: 45 mg via INTRAVENOUS
  Filled 2016-06-18: qty 9

## 2016-06-18 MED ORDER — SODIUM CHLORIDE 0.9% FLUSH
10.0000 mL | INTRAVENOUS | Status: DC | PRN
  Administered 2016-06-18: 10 mL
  Filled 2016-06-18: qty 10

## 2016-06-18 MED ORDER — HEPARIN SOD (PORK) LOCK FLUSH 100 UNIT/ML IV SOLN
500.0000 [IU] | Freq: Once | INTRAVENOUS | Status: AC | PRN
  Administered 2016-06-18: 500 [IU]
  Filled 2016-06-18: qty 5

## 2016-06-18 MED ORDER — PROCHLORPERAZINE MALEATE 10 MG PO TABS
10.0000 mg | ORAL_TABLET | Freq: Once | ORAL | Status: AC
  Administered 2016-06-18: 10 mg via ORAL

## 2016-06-18 MED ORDER — PROCHLORPERAZINE MALEATE 10 MG PO TABS
ORAL_TABLET | ORAL | Status: AC
  Filled 2016-06-18: qty 1

## 2016-06-18 MED ORDER — SODIUM CHLORIDE 0.9 % IV SOLN
Freq: Once | INTRAVENOUS | Status: AC
  Administered 2016-06-18: 10:00:00 via INTRAVENOUS

## 2016-06-18 NOTE — Patient Instructions (Signed)
Decitabine injection for infusion What is this medicine? DECITABINE (dee SYE ta been) is a chemotherapy drug. This medicine reduces the growth of cancer cells. It is used to treat adults with myelodysplastic syndromes. COMMON BRAND NAME(S): Dacogen What should I tell my health care provider before I take this medicine? They need to know if you have any of these conditions: -infection (especially a virus infection such as chickenpox, cold sores, or herpes) -kidney disease -liver disease -an unusual or allergic reaction to decitabine, other medicines, foods, dyes, or preservatives -pregnant or trying to get pregnant -breast-feeding How should I use this medicine? This medicine is for infusion into a vein. It is administered in a hospital or clinic by a doctor or health care professional. Talk to your pediatrician regarding the use of this medicine in children. Special care may be needed. What if I miss a dose? It is important not to miss your dose. Call your doctor or health care professional if you are unable to keep an appointment. What may interact with this medicine? -vaccines Talk to your doctor or health care professional before taking any of these medicines: -aspirin -acetaminophen -ibuprofen -ketoprofen -naproxen What should I watch for while using this medicine? Visit your doctor for checks on your progress. This drug may make you feel generally unwell. This is not uncommon, as chemotherapy can affect healthy cells as well as cancer cells. Report any side effects. Continue your course of treatment even though you feel ill unless your doctor tells you to stop. In some cases, you may be given additional medicines to help with side effects. Follow all directions for their use. Call your doctor or health care professional for advice if you get a fever, chills or sore throat, or other symptoms of a cold or flu. Do not treat yourself. This drug decreases your body's ability to fight  infections. Try to avoid being around people who are sick. This medicine may increase your risk to bruise or bleed. Call your doctor or health care professional if you notice any unusual bleeding. Do not become pregnant while taking this medicine or for at least 1 month after stopping it. Women should inform their doctor if they wish to become pregnant or think they might be pregnant. Men should not father a child while taking this medicine and for at least 2 months after stopping it. There is a potential for serious side effects to an unborn child. Talk to your health care professional or pharmacist for more information. Do not breast-feed an infant while taking this medicine. What side effects may I notice from receiving this medicine? Side effects that you should report to your doctor or health care professional as soon as possible: -low blood counts - this medicine may decrease the number of white blood cells, red blood cells and platelets. You may be at increased risk for infections and bleeding. -signs of infection - fever or chills, cough, sore throat, pain or difficulty passing urine -signs of decreased platelets or bleeding - bruising, pinpoint red spots on the skin, black, tarry stools, blood in the urine -signs of decreased red blood cells - unusual weakness or tiredness, fainting spells, lightheadedness -increased blood sugar Side effects that usually do not require medical attention (report to your doctor or health care professional if they continue or are bothersome): -constipation -diarrhea -headache -loss of appetite -nausea, vomiting -skin rash, itching -stomach pain -water retention -weak or tired Where should I keep my medicine? This drug is given in a hospital  or clinic and will not be stored at home.  2017 Elsevier/Gold Standard (2015-06-30 15:52:57)

## 2016-06-19 ENCOUNTER — Encounter: Payer: Self-pay | Admitting: Hematology & Oncology

## 2016-07-03 ENCOUNTER — Encounter (HOSPITAL_COMMUNITY): Payer: Self-pay

## 2016-07-16 ENCOUNTER — Ambulatory Visit: Payer: Medicare Other

## 2016-07-16 ENCOUNTER — Ambulatory Visit (HOSPITAL_BASED_OUTPATIENT_CLINIC_OR_DEPARTMENT_OTHER): Payer: Medicare Other | Admitting: Hematology & Oncology

## 2016-07-16 ENCOUNTER — Ambulatory Visit (HOSPITAL_BASED_OUTPATIENT_CLINIC_OR_DEPARTMENT_OTHER): Payer: Medicare Other

## 2016-07-16 ENCOUNTER — Other Ambulatory Visit (HOSPITAL_BASED_OUTPATIENT_CLINIC_OR_DEPARTMENT_OTHER): Payer: Medicare Other

## 2016-07-16 ENCOUNTER — Other Ambulatory Visit: Payer: Self-pay | Admitting: Family

## 2016-07-16 DIAGNOSIS — R110 Nausea: Secondary | ICD-10-CM

## 2016-07-16 DIAGNOSIS — D4621 Refractory anemia with excess of blasts 1: Secondary | ICD-10-CM

## 2016-07-16 DIAGNOSIS — D4620 Refractory anemia with excess of blasts, unspecified: Secondary | ICD-10-CM

## 2016-07-16 DIAGNOSIS — F419 Anxiety disorder, unspecified: Secondary | ICD-10-CM

## 2016-07-16 DIAGNOSIS — Z5111 Encounter for antineoplastic chemotherapy: Secondary | ICD-10-CM

## 2016-07-16 LAB — CBC WITH DIFFERENTIAL (CANCER CENTER ONLY)
BASO#: 0.1 10*3/uL (ref 0.0–0.2)
BASO%: 2.1 % — ABNORMAL HIGH (ref 0.0–2.0)
EOS ABS: 0 10*3/uL (ref 0.0–0.5)
EOS%: 0.3 % (ref 0.0–7.0)
HEMATOCRIT: 36.5 % — AB (ref 38.7–49.9)
HGB: 11.8 g/dL — ABNORMAL LOW (ref 13.0–17.1)
LYMPH#: 0.9 10*3/uL (ref 0.9–3.3)
LYMPH%: 24.3 % (ref 14.0–48.0)
MCH: 33.4 pg (ref 28.0–33.4)
MCHC: 32.3 g/dL (ref 32.0–35.9)
MCV: 103 fL — AB (ref 82–98)
MONO#: 0.3 10*3/uL (ref 0.1–0.9)
MONO%: 8.7 % (ref 0.0–13.0)
NEUT#: 2.4 10*3/uL (ref 1.5–6.5)
NEUT%: 64.6 % (ref 40.0–80.0)
Platelets: 344 10*3/uL (ref 145–400)
RBC: 3.53 10*6/uL — ABNORMAL LOW (ref 4.20–5.70)
RDW: 18.4 % — AB (ref 11.1–15.7)
WBC: 3.8 10*3/uL — ABNORMAL LOW (ref 4.0–10.0)

## 2016-07-16 LAB — CMP (CANCER CENTER ONLY)
ALT(SGPT): 22 U/L (ref 10–47)
AST: 21 U/L (ref 11–38)
Albumin: 3.4 g/dL (ref 3.3–5.5)
Alkaline Phosphatase: 40 U/L (ref 26–84)
BUN, Bld: 22 mg/dL (ref 7–22)
CALCIUM: 9.1 mg/dL (ref 8.0–10.3)
CHLORIDE: 106 meq/L (ref 98–108)
CO2: 26 meq/L (ref 18–33)
Creat: 1.4 mg/dl — ABNORMAL HIGH (ref 0.6–1.2)
GLUCOSE: 102 mg/dL (ref 73–118)
Potassium: 3.7 mEq/L (ref 3.3–4.7)
Sodium: 140 mEq/L (ref 128–145)
Total Bilirubin: 0.7 mg/dl (ref 0.20–1.60)
Total Protein: 6.6 g/dL (ref 6.4–8.1)

## 2016-07-16 LAB — TECHNOLOGIST REVIEW CHCC SATELLITE

## 2016-07-16 MED ORDER — PROCHLORPERAZINE MALEATE 10 MG PO TABS
ORAL_TABLET | ORAL | Status: AC
  Filled 2016-07-16: qty 1

## 2016-07-16 MED ORDER — SODIUM CHLORIDE 0.9 % IV SOLN
Freq: Once | INTRAVENOUS | Status: AC
  Administered 2016-07-16: 11:00:00 via INTRAVENOUS

## 2016-07-16 MED ORDER — SODIUM CHLORIDE 0.9 % IV SOLN
20.0000 mg/m2 | Freq: Once | INTRAVENOUS | Status: AC
  Administered 2016-07-16: 45 mg via INTRAVENOUS
  Filled 2016-07-16: qty 9

## 2016-07-16 MED ORDER — LORAZEPAM 0.5 MG PO TABS: 0.5000 mg | ORAL_TABLET | Freq: Four times a day (QID) | ORAL | 0 refills | Status: DC | PRN

## 2016-07-16 MED ORDER — PROCHLORPERAZINE MALEATE 10 MG PO TABS
10.0000 mg | ORAL_TABLET | Freq: Once | ORAL | Status: AC
  Administered 2016-07-16: 10 mg via ORAL

## 2016-07-16 MED ORDER — SODIUM CHLORIDE 0.9% FLUSH
10.0000 mL | INTRAVENOUS | Status: DC | PRN
  Administered 2016-07-16: 10 mL
  Filled 2016-07-16: qty 10

## 2016-07-16 MED ORDER — HEPARIN SOD (PORK) LOCK FLUSH 100 UNIT/ML IV SOLN
500.0000 [IU] | Freq: Once | INTRAVENOUS | Status: AC | PRN
  Administered 2016-07-16: 500 [IU]
  Filled 2016-07-16: qty 5

## 2016-07-16 MED FILL — LORazepam 0.5 MG TABS: 0.5 | 15 days supply | Qty: 60 | Fill #0

## 2016-07-16 NOTE — Patient Instructions (Signed)

## 2016-07-16 NOTE — Progress Notes (Signed)
Hematology and Oncology Follow Up Visit  LADEN FIELDHOUSE [id number] [birth year] 76 y.o. 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 Esty is here today with his wife for a follow-up.we did do a bone marrow biopsy on him. This was after his 6th cycle of treatment. The bone marrow biopsy was done on October 17. The bone marrow  report ([id number]) showed a normocellular marrow with dyspoietic changes. There is no increase in blasts.  As such, he clearly is responding.  We did do cytogenetics. The trisomy 11 clone has been eradicated. Again, this has to be a good sign for him.   His transfusion requirements have decreased. I do not think that he has been transfused now for about 5 months or so.  His last iron studies done back in September showed a ferritin of 2659 but his iron saturation of 27%.   Thankfully, his wife is doing better. She had GI bleeding from a ulcer. Thankfully, she did not require surgery.  Overall, his performance status is ECOG 1.    Medications:  Allergies as of 07/16/2016   No Known Allergies     Medication List       Accurate as of 07/16/16 10:47 AM. Always use your most recent med list.          bimatoprost 0.01 % Soln Commonly known as:  LUMIGAN Place 1 drop into both eyes at bedtime.   budesonide-formoterol 160-4.5 MCG/ACT inhaler Commonly known as:  SYMBICORT Inhale 2 puffs into the lungs 2 (two) times daily.   buPROPion 150 MG 24 hr tablet Commonly known as:  WELLBUTRIN XL Take 150 mg by mouth daily.   clopidogrel 75 MG tablet Commonly known as:  PLAVIX Take 75 mg by mouth daily.   co-enzyme Q-10 30 MG capsule Take 100 mg by mouth daily.     Cyanocobalamin 2500 MCG Tabs Take 5,000 mcg by mouth daily.   fish oil-omega-3 fatty acids 1000 MG capsule Take 1 g by mouth daily.   glucosamine-chondroitin 500-400 MG tablet Take 1 tablet by mouth daily.   LORazepam 0.5 MG tablet Commonly known as:  ATIVAN Take 1 tablet (0.5 mg total) by mouth every 6 (six) hours as needed (Nausea or vomiting).   magnesium oxide 400 MG tablet Commonly known as:  MAG-OX Take 400 mg by mouth daily.   orphenadrine 100 MG tablet Commonly known as:  NORFLEX Take 1 tablet (100 mg total) by mouth 2 (two) times daily as needed for muscle spasms.   pantoprazole 40 MG tablet Commonly known as:  PROTONIX Take 40 mg by mouth daily.   predniSONE 20 MG tablet Commonly known as:  DELTASONE Take 1 tablet (20 mg total) by mouth daily with breakfast.   PROAIR RESPICLICK 962 (90 Base) MCG/ACT Aepb Generic drug:  Albuterol Sulfate Inhale 2 puffs into the lungs every 6 (six) hours as needed.   prochlorperazine 10 MG tablet Commonly known as:  COMPAZINE Take 1 tablet (10 mg total) by mouth every 6 (six) hours as needed (Nausea or vomiting).   ranitidine 300 MG tablet Commonly known as:  ZANTAC Take 300 mg by mouth at bedtime.   sertraline 100  MG tablet Commonly known as:  ZOLOFT Take 100 mg by mouth daily.   SIMBRINZA 1-0.2 % Susp Generic drug:  Brinzolamide-Brimonidine   simvastatin 40 MG tablet Commonly known as:  ZOCOR Take 40 mg by mouth every other day.   tiotropium 18 MCG inhalation capsule Commonly known as:  SPIRIVA Place 18 mcg into inhaler and inhale daily.   Vitamin D3 5000 units Tabs Take by mouth daily.       Allergies: No Known Allergies  Past Medical History, Surgical history, Social history, and Family History were reviewed and updated.  Review of Systems: All other 10 point review of systems is negative.   Physical Exam:  vitals were not taken for this visit.  Wt Readings from Last 3 Encounters:  07/16/16 213 lb  (96.6 kg)  06/05/16 206 lb (93.4 kg)  05/07/16 209 lb (94.8 kg)    Well-developed and well-nourished white male in no obvious distress. Head and neck exam shows no ocular or oral lesions. He has no palpable cervical or supraclavicular lymph nodes. Lungs are clear. Cardiac exam regular rate and rhythm with no murmurs, rubs or bruits. Abdomen is soft. He has good bowel sounds. There is no fluid wave. There is no palpable liver or spleen tip. Back exam shows no tenderness over the spine, ribs or hips. Extremities shows no clubbing, cyanosis or edema. Skin exam shows no rashes, ecchymoses or petechia. Neurological exam shows no focal neurological deficits. oriented, appropriate affect  Lab Results  Component Value Date   WBC 3.8 (L) 07/16/2016   HGB 11.8 (L) 07/16/2016   HCT 36.5 (L) 07/16/2016   MCV 103 (H) 07/16/2016   PLT 344 07/16/2016   Lab Results  Component Value Date   FERRITIN 2,659 (H) 06/12/2016   IRON 79 06/12/2016   TIBC 288 06/12/2016   UIBC 209 06/12/2016   IRONPCTSAT 27 06/12/2016   Lab Results  Component Value Date   RETICCTPCT 1.7 05/30/2015   RBC 3.53 (L) 07/16/2016   RETICCTABS 39.4 05/30/2015   No results found for: KPAFRELGTCHN, LAMBDASER, KAPLAMBRATIO No results found for: Kandis Cocking [facility] Lab Results  Component Value Date   TOTALPROTELP 7.3 01/01/2014   ALBUMINELP 55.1 (L) 01/01/2014   A1GS 6.9 (H) 01/01/2014   A2GS 8.9 01/01/2014   BETS 7.9 (H) 01/01/2014   BETA2SER 6.7 (H) 01/01/2014   GAMS 14.5 01/01/2014   MSPIKE NOT DET 01/01/2014   SPEI * 01/01/2014     Chemistry      Component Value Date/Time   NA 140 07/16/2016 0922   NA 140 04/16/2016 0930   K 3.7 07/16/2016 0922   K 3.9 04/16/2016 0930   CL 106 07/16/2016 0922   CO2 26 07/16/2016 0922   CO2 24 04/16/2016 0930   BUN 22 07/16/2016 0922   BUN 20.5 04/16/2016 0930   CREATININE 1.4 (H) 07/16/2016 0922   CREATININE 1.2 04/16/2016 0930      Component Value Date/Time   CALCIUM  9.1 07/16/2016 0922   CALCIUM 8.9 04/16/2016 0930   ALKPHOS 40 07/16/2016 0922   ALKPHOS 54 04/16/2016 0930   AST 21 07/16/2016 0922   AST 16 04/16/2016 0930   ALT 22 07/16/2016 0922   ALT 21 04/16/2016 0930   BILITOT 0.70 07/16/2016 0922   BILITOT 0.27 04/16/2016 0930     Impression and Plan: Mr. Poche is 76 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   He is still responding quite nicely. I'm just glad that he is doing so  well. Again, he is responded very nicely to therapy.  We are giving him 4 weeks in between cycles. This I think is helping.  I will plan to get him back in 5 weeks. I really don't think he needs any blood work in between visits.  He certainly can, and see [place] if he does have any problems.  Volanda Napoleon, MD 2/5/201810:47 AM  I

## 2016-07-17 ENCOUNTER — Ambulatory Visit (HOSPITAL_BASED_OUTPATIENT_CLINIC_OR_DEPARTMENT_OTHER): Payer: Medicare Other

## 2016-07-17 VITALS — BP 155/71 | HR 90 | Resp 18

## 2016-07-17 DIAGNOSIS — D4620 Refractory anemia with excess of blasts, unspecified: Secondary | ICD-10-CM

## 2016-07-17 DIAGNOSIS — D4621 Refractory anemia with excess of blasts 1: Secondary | ICD-10-CM | POA: Diagnosis not present

## 2016-07-17 DIAGNOSIS — Z5111 Encounter for antineoplastic chemotherapy: Secondary | ICD-10-CM | POA: Diagnosis not present

## 2016-07-17 MED ORDER — SODIUM CHLORIDE 0.9 % IV SOLN
20.0000 mg/m2 | Freq: Once | INTRAVENOUS | Status: AC
  Administered 2016-07-17: 45 mg via INTRAVENOUS
  Filled 2016-07-17: qty 9

## 2016-07-17 MED ORDER — HEPARIN SOD (PORK) LOCK FLUSH 100 UNIT/ML IV SOLN
500.0000 [IU] | Freq: Once | INTRAVENOUS | Status: AC | PRN
  Administered 2016-07-17: 500 [IU]
  Filled 2016-07-17: qty 5

## 2016-07-17 MED ORDER — PROCHLORPERAZINE MALEATE 10 MG PO TABS
10.0000 mg | ORAL_TABLET | Freq: Once | ORAL | Status: AC
  Administered 2016-07-17: 10 mg via ORAL

## 2016-07-17 MED ORDER — SODIUM CHLORIDE 0.9 % IV SOLN
Freq: Once | INTRAVENOUS | Status: AC
  Administered 2016-07-17: 10:00:00 via INTRAVENOUS

## 2016-07-17 MED ORDER — SODIUM CHLORIDE 0.9% FLUSH
10.0000 mL | INTRAVENOUS | Status: DC | PRN
  Administered 2016-07-17: 10 mL
  Filled 2016-07-17: qty 10

## 2016-07-17 MED ORDER — PROCHLORPERAZINE MALEATE 10 MG PO TABS
ORAL_TABLET | ORAL | Status: AC
  Filled 2016-07-17: qty 1

## 2016-07-17 NOTE — Patient Instructions (Signed)
[facility] Discharge Instructions for Patients Receiving Chemotherapy  Today you received the following chemotherapy agents Dacogen  To help prevent nausea and vomiting after your treatment, we encourage you to take your nausea medication as prescribed by MD.   If you develop nausea and vomiting that is not controlled by your nausea medication, call the clinic.   BELOW ARE SYMPTOMS THAT SHOULD BE REPORTED IMMEDIATELY:  *FEVER GREATER THAN 100.5 F  *CHILLS WITH OR WITHOUT FEVER  NAUSEA AND VOMITING THAT IS NOT CONTROLLED WITH YOUR NAUSEA MEDICATION  *UNUSUAL SHORTNESS OF BREATH  *UNUSUAL BRUISING OR BLEEDING  TENDERNESS IN MOUTH AND THROAT WITH OR WITHOUT PRESENCE OF ULCERS  *URINARY PROBLEMS  *BOWEL PROBLEMS  UNUSUAL RASH Items with * indicate a potential emergency and should be followed up as soon as possible.  Feel free to call the clinic you have any questions or concerns. The clinic phone number is [phone].  Please show the CHEMO ALERT CARD at check-in to the Emergency Department and triage nurse.   

## 2016-07-18 ENCOUNTER — Ambulatory Visit (HOSPITAL_BASED_OUTPATIENT_CLINIC_OR_DEPARTMENT_OTHER): Payer: Medicare Other

## 2016-07-18 VITALS — BP 124/70 | HR 97 | Resp 18

## 2016-07-18 DIAGNOSIS — Z5111 Encounter for antineoplastic chemotherapy: Secondary | ICD-10-CM

## 2016-07-18 DIAGNOSIS — D4621 Refractory anemia with excess of blasts 1: Secondary | ICD-10-CM | POA: Diagnosis not present

## 2016-07-18 DIAGNOSIS — D4620 Refractory anemia with excess of blasts, unspecified: Secondary | ICD-10-CM

## 2016-07-18 MED ORDER — HEPARIN SOD (PORK) LOCK FLUSH 100 UNIT/ML IV SOLN
250.0000 [IU] | Freq: Once | INTRAVENOUS | Status: DC | PRN
  Filled 2016-07-18: qty 5

## 2016-07-18 MED ORDER — PROCHLORPERAZINE MALEATE 10 MG PO TABS
10.0000 mg | ORAL_TABLET | Freq: Once | ORAL | Status: AC
  Administered 2016-07-18: 10 mg via ORAL

## 2016-07-18 MED ORDER — SODIUM CHLORIDE 0.9 % IV SOLN
20.0000 mg/m2 | Freq: Once | INTRAVENOUS | Status: AC
  Administered 2016-07-18: 45 mg via INTRAVENOUS
  Filled 2016-07-18: qty 9

## 2016-07-18 MED ORDER — PROCHLORPERAZINE MALEATE 10 MG PO TABS
ORAL_TABLET | ORAL | Status: AC
  Filled 2016-07-18: qty 1

## 2016-07-18 MED ORDER — SODIUM CHLORIDE 0.9% FLUSH
3.0000 mL | INTRAVENOUS | Status: DC | PRN
  Filled 2016-07-18: qty 10

## 2016-07-18 MED ORDER — ALTEPLASE 2 MG IJ SOLR
2.0000 mg | Freq: Once | INTRAMUSCULAR | Status: DC | PRN
  Filled 2016-07-18: qty 2

## 2016-07-18 MED ORDER — HEPARIN SOD (PORK) LOCK FLUSH 100 UNIT/ML IV SOLN
500.0000 [IU] | Freq: Once | INTRAVENOUS | Status: AC | PRN
  Administered 2016-07-18: 500 [IU]
  Filled 2016-07-18: qty 5

## 2016-07-18 MED ORDER — SODIUM CHLORIDE 0.9 % IV SOLN
Freq: Once | INTRAVENOUS | Status: AC
  Administered 2016-07-18: 11:00:00 via INTRAVENOUS

## 2016-07-18 MED ORDER — SODIUM CHLORIDE 0.9% FLUSH
10.0000 mL | INTRAVENOUS | Status: DC | PRN
  Administered 2016-07-18: 10 mL
  Filled 2016-07-18: qty 10

## 2016-07-18 NOTE — Patient Instructions (Signed)
Decitabine injection for infusion What is this medicine? DECITABINE (dee SYE ta been) is a chemotherapy drug. This medicine reduces the growth of cancer cells. It is used to treat adults with myelodysplastic syndromes. COMMON BRAND NAME(S): Dacogen What should I tell my health care provider before I take this medicine? They need to know if you have any of these conditions: -infection (especially a virus infection such as chickenpox, cold sores, or herpes) -kidney disease -liver disease -an unusual or allergic reaction to decitabine, other medicines, foods, dyes, or preservatives -pregnant or trying to get pregnant -breast-feeding How should I use this medicine? This medicine is for infusion into a vein. It is administered in a hospital or clinic by a doctor or health care professional. Talk to your pediatrician regarding the use of this medicine in children. Special care may be needed. What if I miss a dose? It is important not to miss your dose. Call your doctor or health care professional if you are unable to keep an appointment. What may interact with this medicine? -vaccines Talk to your doctor or health care professional before taking any of these medicines: -aspirin -acetaminophen -ibuprofen -ketoprofen -naproxen What should I watch for while using this medicine? Visit your doctor for checks on your progress. This drug may make you feel generally unwell. This is not uncommon, as chemotherapy can affect healthy cells as well as cancer cells. Report any side effects. Continue your course of treatment even though you feel ill unless your doctor tells you to stop. In some cases, you may be given additional medicines to help with side effects. Follow all directions for their use. Call your doctor or health care professional for advice if you get a fever, chills or sore throat, or other symptoms of a cold or flu. Do not treat yourself. This drug decreases your body's ability to fight  infections. Try to avoid being around people who are sick. This medicine may increase your risk to bruise or bleed. Call your doctor or health care professional if you notice any unusual bleeding. Do not become pregnant while taking this medicine or for at least 1 month after stopping it. Women should inform their doctor if they wish to become pregnant or think they might be pregnant. Men should not father a child while taking this medicine and for at least 2 months after stopping it. There is a potential for serious side effects to an unborn child. Talk to your health care professional or pharmacist for more information. Do not breast-feed an infant while taking this medicine. What side effects may I notice from receiving this medicine? Side effects that you should report to your doctor or health care professional as soon as possible: -low blood counts - this medicine may decrease the number of white blood cells, red blood cells and platelets. You may be at increased risk for infections and bleeding. -signs of infection - fever or chills, cough, sore throat, pain or difficulty passing urine -signs of decreased platelets or bleeding - bruising, pinpoint red spots on the skin, black, tarry stools, blood in the urine -signs of decreased red blood cells - unusual weakness or tiredness, fainting spells, lightheadedness -increased blood sugar Side effects that usually do not require medical attention (report to your doctor or health care professional if they continue or are bothersome): -constipation -diarrhea -headache -loss of appetite -nausea, vomiting -skin rash, itching -stomach pain -water retention -weak or tired Where should I keep my medicine? This drug is given in a hospital  or clinic and will not be stored at home.  2017 Elsevier/Gold Standard (2015-06-30 15:52:57)

## 2016-07-19 ENCOUNTER — Ambulatory Visit (HOSPITAL_BASED_OUTPATIENT_CLINIC_OR_DEPARTMENT_OTHER): Payer: Medicare Other

## 2016-07-19 VITALS — BP 136/61 | HR 84 | Temp 97.4°F | Resp 18

## 2016-07-19 DIAGNOSIS — D4621 Refractory anemia with excess of blasts 1: Secondary | ICD-10-CM | POA: Diagnosis not present

## 2016-07-19 DIAGNOSIS — D4620 Refractory anemia with excess of blasts, unspecified: Secondary | ICD-10-CM

## 2016-07-19 DIAGNOSIS — Z5111 Encounter for antineoplastic chemotherapy: Secondary | ICD-10-CM | POA: Diagnosis not present

## 2016-07-19 MED ORDER — SODIUM CHLORIDE 0.9 % IV SOLN
20.0000 mg/m2 | Freq: Once | INTRAVENOUS | Status: AC
  Administered 2016-07-19: 45 mg via INTRAVENOUS
  Filled 2016-07-19: qty 9

## 2016-07-19 MED ORDER — SODIUM CHLORIDE 0.9 % IV SOLN
Freq: Once | INTRAVENOUS | Status: AC
  Administered 2016-07-19: 11:00:00 via INTRAVENOUS

## 2016-07-19 MED ORDER — PROCHLORPERAZINE MALEATE 10 MG PO TABS
ORAL_TABLET | ORAL | Status: AC
  Filled 2016-07-19: qty 1

## 2016-07-19 MED ORDER — SODIUM CHLORIDE 0.9% FLUSH
10.0000 mL | INTRAVENOUS | Status: DC | PRN
  Administered 2016-07-19: 10 mL
  Filled 2016-07-19: qty 10

## 2016-07-19 MED ORDER — PROCHLORPERAZINE MALEATE 10 MG PO TABS
10.0000 mg | ORAL_TABLET | Freq: Once | ORAL | Status: AC
  Administered 2016-07-19: 10 mg via ORAL

## 2016-07-19 MED ORDER — HEPARIN SOD (PORK) LOCK FLUSH 100 UNIT/ML IV SOLN
500.0000 [IU] | Freq: Once | INTRAVENOUS | Status: AC | PRN
  Administered 2016-07-19: 500 [IU]
  Filled 2016-07-19: qty 5

## 2016-07-20 ENCOUNTER — Ambulatory Visit (HOSPITAL_BASED_OUTPATIENT_CLINIC_OR_DEPARTMENT_OTHER): Payer: Medicare Other

## 2016-07-20 VITALS — BP 107/57 | HR 85 | Temp 97.5°F | Resp 18

## 2016-07-20 DIAGNOSIS — Z5111 Encounter for antineoplastic chemotherapy: Secondary | ICD-10-CM | POA: Diagnosis not present

## 2016-07-20 DIAGNOSIS — D4621 Refractory anemia with excess of blasts 1: Secondary | ICD-10-CM

## 2016-07-20 DIAGNOSIS — D4620 Refractory anemia with excess of blasts, unspecified: Secondary | ICD-10-CM

## 2016-07-20 MED ORDER — PROCHLORPERAZINE MALEATE 10 MG PO TABS
ORAL_TABLET | ORAL | Status: AC
  Filled 2016-07-20: qty 1

## 2016-07-20 MED ORDER — SODIUM CHLORIDE 0.9% FLUSH
10.0000 mL | INTRAVENOUS | Status: DC | PRN
  Administered 2016-07-20: 10 mL
  Filled 2016-07-20: qty 10

## 2016-07-20 MED ORDER — SODIUM CHLORIDE 0.9 % IV SOLN
20.0000 mg/m2 | Freq: Once | INTRAVENOUS | Status: AC
  Administered 2016-07-20: 45 mg via INTRAVENOUS
  Filled 2016-07-20: qty 9

## 2016-07-20 MED ORDER — HEPARIN SOD (PORK) LOCK FLUSH 100 UNIT/ML IV SOLN
500.0000 [IU] | Freq: Once | INTRAVENOUS | Status: AC | PRN
  Administered 2016-07-20: 500 [IU]
  Filled 2016-07-20: qty 5

## 2016-07-20 MED ORDER — PROCHLORPERAZINE MALEATE 10 MG PO TABS
10.0000 mg | ORAL_TABLET | Freq: Once | ORAL | Status: AC
  Administered 2016-07-20: 10 mg via ORAL

## 2016-07-20 MED ORDER — SODIUM CHLORIDE 0.9 % IV SOLN
Freq: Once | INTRAVENOUS | Status: AC
  Administered 2016-07-20: 10:00:00 via INTRAVENOUS

## 2016-07-20 NOTE — Patient Instructions (Signed)
[facility] Discharge Instructions for Patients Receiving Chemotherapy  Today you received the following chemotherapy agents Dacogen  To help prevent nausea and vomiting after your treatment, we encourage you to take your nausea medication as prescribed by MD.   If you develop nausea and vomiting that is not controlled by your nausea medication, call the clinic.   BELOW ARE SYMPTOMS THAT SHOULD BE REPORTED IMMEDIATELY:  *FEVER GREATER THAN 100.5 F  *CHILLS WITH OR WITHOUT FEVER  NAUSEA AND VOMITING THAT IS NOT CONTROLLED WITH YOUR NAUSEA MEDICATION  *UNUSUAL SHORTNESS OF BREATH  *UNUSUAL BRUISING OR BLEEDING  TENDERNESS IN MOUTH AND THROAT WITH OR WITHOUT PRESENCE OF ULCERS  *URINARY PROBLEMS  *BOWEL PROBLEMS  UNUSUAL RASH Items with * indicate a potential emergency and should be followed up as soon as possible.  Feel free to call the clinic you have any questions or concerns. The clinic phone number is [phone].  Please show the CHEMO ALERT CARD at check-in to the Emergency Department and triage nurse.   

## 2016-08-20 ENCOUNTER — Ambulatory Visit: Payer: Medicare Other

## 2016-08-20 ENCOUNTER — Ambulatory Visit (HOSPITAL_BASED_OUTPATIENT_CLINIC_OR_DEPARTMENT_OTHER): Payer: Medicare Other | Admitting: Family

## 2016-08-20 ENCOUNTER — Other Ambulatory Visit (HOSPITAL_BASED_OUTPATIENT_CLINIC_OR_DEPARTMENT_OTHER): Payer: Medicare Other

## 2016-08-20 ENCOUNTER — Ambulatory Visit (HOSPITAL_BASED_OUTPATIENT_CLINIC_OR_DEPARTMENT_OTHER): Payer: Medicare Other

## 2016-08-20 ENCOUNTER — Encounter: Payer: Self-pay | Admitting: Family

## 2016-08-20 VITALS — BP 126/62 | HR 89 | Temp 98.1°F | Resp 16 | Wt 212.0 lb

## 2016-08-20 DIAGNOSIS — D4620 Refractory anemia with excess of blasts, unspecified: Secondary | ICD-10-CM

## 2016-08-20 DIAGNOSIS — D4621 Refractory anemia with excess of blasts 1: Secondary | ICD-10-CM

## 2016-08-20 DIAGNOSIS — D509 Iron deficiency anemia, unspecified: Secondary | ICD-10-CM

## 2016-08-20 DIAGNOSIS — Z5111 Encounter for antineoplastic chemotherapy: Secondary | ICD-10-CM

## 2016-08-20 LAB — CBC WITH DIFFERENTIAL (CANCER CENTER ONLY)
BASO#: 0.1 10*3/uL (ref 0.0–0.2)
BASO%: 1.7 % (ref 0.0–2.0)
EOS%: 0.5 % (ref 0.0–7.0)
Eosinophils Absolute: 0 10*3/uL (ref 0.0–0.5)
HEMATOCRIT: 36.7 % — AB (ref 38.7–49.9)
HGB: 11.9 g/dL — ABNORMAL LOW (ref 13.0–17.1)
LYMPH#: 0.8 10*3/uL — AB (ref 0.9–3.3)
LYMPH%: 13.2 % — ABNORMAL LOW (ref 14.0–48.0)
MCH: 33.9 pg — ABNORMAL HIGH (ref 28.0–33.4)
MCHC: 32.4 g/dL (ref 32.0–35.9)
MCV: 105 fL — ABNORMAL HIGH (ref 82–98)
MONO#: 0.6 10*3/uL (ref 0.1–0.9)
MONO%: 10.2 % (ref 0.0–13.0)
NEUT#: 4.5 10*3/uL (ref 1.5–6.5)
NEUT%: 74.4 % (ref 40.0–80.0)
PLATELETS: 378 10*3/uL (ref 145–400)
RBC: 3.51 10*6/uL — ABNORMAL LOW (ref 4.20–5.70)
RDW: 15.4 % (ref 11.1–15.7)
WBC: 6 10*3/uL (ref 4.0–10.0)

## 2016-08-20 LAB — CMP (CANCER CENTER ONLY)
ALT: 31 U/L (ref 10–47)
AST: 28 U/L (ref 11–38)
Albumin: 3.3 g/dL (ref 3.3–5.5)
Alkaline Phosphatase: 44 U/L (ref 26–84)
BILIRUBIN TOTAL: 0.5 mg/dL (ref 0.20–1.60)
BUN: 21 mg/dL (ref 7–22)
CO2: 26 meq/L (ref 18–33)
Calcium: 8.8 mg/dL (ref 8.0–10.3)
Chloride: 106 mEq/L (ref 98–108)
Creat: 1.4 mg/dl — ABNORMAL HIGH (ref 0.6–1.2)
GLUCOSE: 98 mg/dL (ref 73–118)
POTASSIUM: 4 meq/L (ref 3.3–4.7)
Sodium: 143 mEq/L (ref 128–145)
Total Protein: 6.7 g/dL (ref 6.4–8.1)

## 2016-08-20 LAB — LACTATE DEHYDROGENASE: LDH: 228 U/L (ref 125–245)

## 2016-08-20 LAB — FERRITIN: Ferritin: 2046 ng/ml — ABNORMAL HIGH (ref 22–316)

## 2016-08-20 LAB — IRON AND TIBC
%SAT: 26 % (ref 20–55)
IRON: 69 ug/dL (ref 42–163)
TIBC: 263 ug/dL (ref 202–409)
UIBC: 193 ug/dL (ref 117–376)

## 2016-08-20 LAB — CHCC SATELLITE - SMEAR

## 2016-08-20 LAB — TECHNOLOGIST REVIEW CHCC SATELLITE

## 2016-08-20 MED ORDER — SODIUM CHLORIDE 0.9% FLUSH
10.0000 mL | INTRAVENOUS | Status: DC | PRN
  Administered 2016-08-20: 10 mL
  Filled 2016-08-20: qty 10

## 2016-08-20 MED ORDER — SODIUM CHLORIDE 0.9 % IV SOLN
Freq: Once | INTRAVENOUS | Status: AC
  Administered 2016-08-20: 11:00:00 via INTRAVENOUS

## 2016-08-20 MED ORDER — PROCHLORPERAZINE MALEATE 10 MG PO TABS
10.0000 mg | ORAL_TABLET | Freq: Once | ORAL | Status: AC
  Administered 2016-08-20: 10 mg via ORAL

## 2016-08-20 MED ORDER — SODIUM CHLORIDE 0.9 % IV SOLN
20.0000 mg/m2 | Freq: Once | INTRAVENOUS | Status: AC
  Administered 2016-08-20: 45 mg via INTRAVENOUS
  Filled 2016-08-20: qty 9

## 2016-08-20 MED ORDER — HEPARIN SOD (PORK) LOCK FLUSH 100 UNIT/ML IV SOLN
500.0000 [IU] | Freq: Once | INTRAVENOUS | Status: AC | PRN
  Administered 2016-08-20: 500 [IU]
  Filled 2016-08-20: qty 5

## 2016-08-20 MED ORDER — PROCHLORPERAZINE MALEATE 10 MG PO TABS
ORAL_TABLET | ORAL | Status: AC
  Filled 2016-08-20: qty 1

## 2016-08-20 NOTE — Patient Instructions (Signed)
[facility] Discharge Instructions for Patients Receiving Chemotherapy  Today you received the following chemotherapy agents Dacogen  To help prevent nausea and vomiting after your treatment, we encourage you to take your nausea medication as prescribed by MD.   If you develop nausea and vomiting that is not controlled by your nausea medication, call the clinic.   BELOW ARE SYMPTOMS THAT SHOULD BE REPORTED IMMEDIATELY:  *FEVER GREATER THAN 100.5 F  *CHILLS WITH OR WITHOUT FEVER  NAUSEA AND VOMITING THAT IS NOT CONTROLLED WITH YOUR NAUSEA MEDICATION  *UNUSUAL SHORTNESS OF BREATH  *UNUSUAL BRUISING OR BLEEDING  TENDERNESS IN MOUTH AND THROAT WITH OR WITHOUT PRESENCE OF ULCERS  *URINARY PROBLEMS  *BOWEL PROBLEMS  UNUSUAL RASH Items with * indicate a potential emergency and should be followed up as soon as possible.  Feel free to call the clinic you have any questions or concerns. The clinic phone number is [phone].  Please show the [place] at check-in to the Emergency Department and triage nurse.

## 2016-08-20 NOTE — Progress Notes (Signed)
Hematology and Oncology Follow Up Visit  Anthony [name] [id number] February 11, 1941 76 y.o. 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 He is doing quite well. He still has occasional episodes of fatigue. He takes breaks to rest as needed.  He had normal cytogenetics on his bone marrow biopsy in December. He has been hemodynamically stable for quite a while and hasn't required a blood transfusion recently.  Iron saturation in January was 27% with a ferritin of 2,659.   No fever, chills, n/v, cough, oral sores rash, headache, chest pain, palpitations, night sweats, abdominal pain or changes in bladder habits.  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  No episodes of bleeding or bruising. No lymphadenopathy found on exam.  No swelling or tenderness in his extremities.The numbness and tingling in his toes is unchanged. He denies joint aches or "bone" pain.  He uses his cane when ambulating and has had no falls or syncopal episodes.  His appetite is improving and he is staying well hydrated. He still supplements with boost daily. His weight is unchanged.     Medications:  Allergies as of 08/20/2016   No Known Allergies     Medication List       Accurate as of 08/20/16  9:54 AM. Always use your most recent med list.          bimatoprost 0.01 % Soln Commonly known as:  LUMIGAN Place 1 drop into both eyes at bedtime.   budesonide-formoterol 160-4.5 MCG/ACT inhaler Commonly known as:  SYMBICORT Inhale 2 puffs into the lungs 2 (two) times daily.   buPROPion 150 MG 24 hr tablet Commonly known as:  WELLBUTRIN XL Take 150 mg by mouth daily.     clopidogrel 75 MG tablet Commonly known as:  PLAVIX Take 75 mg by mouth daily.   co-enzyme Q-10 30 MG capsule Take 100 mg by mouth daily.   Cyanocobalamin 2500 MCG Tabs Take 5,000 mcg by mouth daily.   fish oil-omega-3 fatty acids 1000 MG capsule Take 1 g by mouth daily.   glucosamine-chondroitin 500-400 MG tablet Take 1 tablet by mouth daily.   LORazepam 0.5 MG tablet Commonly known as:  ATIVAN Take 1 tablet (0.5 mg total) by mouth every 6 (six) hours as needed (Nausea or vomiting).   magnesium oxide 400 MG tablet Commonly known as:  MAG-OX Take 400 mg by mouth daily.   orphenadrine 100 MG tablet Commonly known as:  NORFLEX Take 1 tablet (100 mg total) by mouth 2 (two) times daily as needed for muscle spasms.   pantoprazole 40 MG tablet Commonly known as:  PROTONIX Take 40 mg by mouth daily.   predniSONE 20 MG tablet Commonly known as:  DELTASONE Take 1 tablet (20 mg total) by mouth daily with breakfast.   PROAIR RESPICLICK 671 (90 Base) MCG/ACT Aepb Generic drug:  Albuterol Sulfate Inhale 2 puffs into the lungs every 6 (six) hours as needed.   prochlorperazine 10 MG tablet Commonly known as:  COMPAZINE Take 1 tablet (10 mg total) by mouth every 6 (six) hours as needed (Nausea or vomiting).   ranitidine 300  MG tablet Commonly known as:  ZANTAC Take 300 mg by mouth at bedtime.   sertraline 100 MG tablet Commonly known as:  ZOLOFT Take 100 mg by mouth daily.   SIMBRINZA 1-0.2 % Susp Generic drug:  Brinzolamide-Brimonidine   simvastatin 40 MG tablet Commonly known as:  ZOCOR Take 40 mg by mouth every other day.   tiotropium 18 MCG inhalation capsule Commonly known as:  SPIRIVA Place 18 mcg into inhaler and inhale daily.   Vitamin D3 5000 units Tabs Take by mouth daily.       Allergies: No Known Allergies  Past Medical History, Surgical history, Social history, and Family History were reviewed and updated.  Review of Systems: All other 10 point  review of systems is negative.   Physical Exam:  weight is 212 lb (96.2 kg). His oral temperature is 98.1 F (36.7 C). His blood pressure is 126/62 and his pulse is 89. His respiration is 16 and oxygen saturation is 96%.   Wt Readings from Last 3 Encounters:  08/20/16 212 lb (96.2 kg)  07/16/16 213 lb (96.6 kg)  06/05/16 206 lb (93.4 kg)    Ocular: Sclerae unicteric, pupils equal, round and reactive to light Ear-nose-throat: Oropharynx clear, dentition fair Lymphatic: No cervical supraclavicular or axillary adenopathy Lungs no rales or rhonchi, good excursion bilaterally Heart regular rate and rhythm, no murmur appreciated Abd soft, nontender, positive bowel sounds, no oliver or spleen tip palpated on exam, no fluid wave MSK no focal spinal tenderness, no joint edema Neuro: non-focal, well-oriented, appropriate affect  Lab Results  Component Value Date   WBC 3.8 (L) 07/16/2016   HGB 11.8 (L) 07/16/2016   HCT 36.5 (L) 07/16/2016   MCV 103 (H) 07/16/2016   PLT 344 07/16/2016   Lab Results  Component Value Date   FERRITIN 2,659 (H) 06/12/2016   IRON 79 06/12/2016   TIBC 288 06/12/2016   UIBC 209 06/12/2016   IRONPCTSAT 27 06/12/2016   Lab Results  Component Value Date   RETICCTPCT 1.7 05/30/2015   RBC 3.53 (L) 07/16/2016   RETICCTABS 39.4 05/30/2015   No results found for: KPAFRELGTCHN, LAMBDASER, KAPLAMBRATIO No results found for: Kandis Cocking [facility] Lab Results  Component Value Date   TOTALPROTELP 7.3 01/01/2014   ALBUMINELP 55.1 (L) 01/01/2014   A1GS 6.9 (H) 01/01/2014   A2GS 8.9 01/01/2014   BETS 7.9 (H) 01/01/2014   BETA2SER 6.7 (H) 01/01/2014   GAMS 14.5 01/01/2014   MSPIKE NOT DET 01/01/2014   SPEI * 01/01/2014     Chemistry      Component Value Date/Time   NA 140 07/16/2016 0922   NA 140 04/16/2016 0930   K 3.7 07/16/2016 0922   K 3.9 04/16/2016 0930   CL 106 07/16/2016 0922   CO2 26 07/16/2016 0922   CO2 24 04/16/2016 0930   BUN 22  07/16/2016 0922   BUN 20.5 04/16/2016 0930   CREATININE 1.4 (H) 07/16/2016 0922   CREATININE 1.2 04/16/2016 0930      Component Value Date/Time   CALCIUM 9.1 07/16/2016 0922   CALCIUM 8.9 04/16/2016 0930   ALKPHOS 40 07/16/2016 0922   ALKPHOS 54 04/16/2016 0930   AST 21 07/16/2016 0922   AST 16 04/16/2016 0930   ALT 22 07/16/2016 0922   ALT 21 04/16/2016 0930   BILITOT 0.70 07/16/2016 0922   BILITOT 0.27 04/16/2016 0930     Impression and Plan: Mr. 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 Her is  doing well and had normal cytogenetics on his last bone marrow biopsy in December. He is doing well and has no complaints at this time.  Iron studies are pending.  We will proceed with cycle 9 of Decitabine as planned.  He has his current treatment/appointment schedule.  He will contact us with any questions or concerns. We can certainly see him sooner if need be.   Eliezer Bottom, NP 3/12/20189:54 AM

## 2016-08-21 ENCOUNTER — Ambulatory Visit (HOSPITAL_BASED_OUTPATIENT_CLINIC_OR_DEPARTMENT_OTHER): Payer: Medicare Other

## 2016-08-21 VITALS — BP 139/66 | HR 89 | Temp 97.4°F | Resp 16

## 2016-08-21 DIAGNOSIS — D4621 Refractory anemia with excess of blasts 1: Secondary | ICD-10-CM | POA: Diagnosis not present

## 2016-08-21 DIAGNOSIS — Z5111 Encounter for antineoplastic chemotherapy: Secondary | ICD-10-CM | POA: Diagnosis not present

## 2016-08-21 DIAGNOSIS — D4620 Refractory anemia with excess of blasts, unspecified: Secondary | ICD-10-CM

## 2016-08-21 MED ORDER — SODIUM CHLORIDE 0.9 % IV SOLN
20.0000 mg/m2 | Freq: Once | INTRAVENOUS | Status: AC
  Administered 2016-08-21: 45 mg via INTRAVENOUS
  Filled 2016-08-21: qty 9

## 2016-08-21 MED ORDER — SODIUM CHLORIDE 0.9% FLUSH
10.0000 mL | INTRAVENOUS | Status: DC | PRN
  Administered 2016-08-21: 10 mL
  Filled 2016-08-21: qty 10

## 2016-08-21 MED ORDER — PROCHLORPERAZINE MALEATE 10 MG PO TABS
ORAL_TABLET | ORAL | Status: AC
  Filled 2016-08-21: qty 1

## 2016-08-21 MED ORDER — SODIUM CHLORIDE 0.9 % IV SOLN
Freq: Once | INTRAVENOUS | Status: AC
  Administered 2016-08-21: 10:00:00 via INTRAVENOUS

## 2016-08-21 MED ORDER — HEPARIN SOD (PORK) LOCK FLUSH 100 UNIT/ML IV SOLN
500.0000 [IU] | Freq: Once | INTRAVENOUS | Status: AC | PRN
  Administered 2016-08-21: 500 [IU]
  Filled 2016-08-21: qty 5

## 2016-08-21 MED ORDER — PROCHLORPERAZINE MALEATE 10 MG PO TABS
10.0000 mg | ORAL_TABLET | Freq: Once | ORAL | Status: AC
  Administered 2016-08-21: 10 mg via ORAL

## 2016-08-22 ENCOUNTER — Ambulatory Visit (HOSPITAL_BASED_OUTPATIENT_CLINIC_OR_DEPARTMENT_OTHER): Payer: Medicare Other

## 2016-08-22 VITALS — BP 136/65 | HR 98 | Temp 97.6°F | Resp 18

## 2016-08-22 DIAGNOSIS — Z5111 Encounter for antineoplastic chemotherapy: Secondary | ICD-10-CM

## 2016-08-22 DIAGNOSIS — D4620 Refractory anemia with excess of blasts, unspecified: Secondary | ICD-10-CM

## 2016-08-22 DIAGNOSIS — D4621 Refractory anemia with excess of blasts 1: Secondary | ICD-10-CM | POA: Diagnosis not present

## 2016-08-22 MED ORDER — SODIUM CHLORIDE 0.9 % IV SOLN
20.0000 mg/m2 | Freq: Once | INTRAVENOUS | Status: AC
  Administered 2016-08-22: 45 mg via INTRAVENOUS
  Filled 2016-08-22: qty 9

## 2016-08-22 MED ORDER — SODIUM CHLORIDE 0.9 % IV SOLN
Freq: Once | INTRAVENOUS | Status: AC
  Administered 2016-08-22: 10:00:00 via INTRAVENOUS

## 2016-08-22 MED ORDER — PROCHLORPERAZINE MALEATE 10 MG PO TABS
10.0000 mg | ORAL_TABLET | Freq: Once | ORAL | Status: AC
  Administered 2016-08-22: 10 mg via ORAL

## 2016-08-22 MED ORDER — HEPARIN SOD (PORK) LOCK FLUSH 100 UNIT/ML IV SOLN
500.0000 [IU] | Freq: Once | INTRAVENOUS | Status: AC | PRN
  Administered 2016-08-22: 500 [IU]
  Filled 2016-08-22: qty 5

## 2016-08-22 MED ORDER — PROCHLORPERAZINE MALEATE 10 MG PO TABS
ORAL_TABLET | ORAL | Status: AC
  Filled 2016-08-22: qty 1

## 2016-08-22 MED ORDER — SODIUM CHLORIDE 0.9% FLUSH
10.0000 mL | INTRAVENOUS | Status: DC | PRN
  Administered 2016-08-22: 10 mL
  Filled 2016-08-22: qty 10

## 2016-08-23 ENCOUNTER — Ambulatory Visit (HOSPITAL_BASED_OUTPATIENT_CLINIC_OR_DEPARTMENT_OTHER): Payer: Medicare Other

## 2016-08-23 VITALS — BP 112/58 | HR 87 | Temp 97.9°F | Resp 18

## 2016-08-23 DIAGNOSIS — D4620 Refractory anemia with excess of blasts, unspecified: Secondary | ICD-10-CM

## 2016-08-23 DIAGNOSIS — Z5111 Encounter for antineoplastic chemotherapy: Secondary | ICD-10-CM | POA: Diagnosis not present

## 2016-08-23 DIAGNOSIS — D4621 Refractory anemia with excess of blasts 1: Secondary | ICD-10-CM

## 2016-08-23 MED ORDER — SODIUM CHLORIDE 0.9 % IV SOLN
Freq: Once | INTRAVENOUS | Status: AC
  Administered 2016-08-23: 10:00:00 via INTRAVENOUS

## 2016-08-23 MED ORDER — PROCHLORPERAZINE MALEATE 10 MG PO TABS
10.0000 mg | ORAL_TABLET | Freq: Once | ORAL | Status: AC
  Administered 2016-08-23: 10 mg via ORAL

## 2016-08-23 MED ORDER — PROCHLORPERAZINE MALEATE 10 MG PO TABS
ORAL_TABLET | ORAL | Status: AC
  Filled 2016-08-23: qty 1

## 2016-08-23 MED ORDER — SODIUM CHLORIDE 0.9 % IV SOLN
20.0000 mg/m2 | Freq: Once | INTRAVENOUS | Status: AC
  Administered 2016-08-23: 45 mg via INTRAVENOUS
  Filled 2016-08-23: qty 9

## 2016-08-23 MED ORDER — SODIUM CHLORIDE 0.9% FLUSH
10.0000 mL | INTRAVENOUS | Status: DC | PRN
  Administered 2016-08-23: 10 mL
  Filled 2016-08-23: qty 10

## 2016-08-23 MED ORDER — HEPARIN SOD (PORK) LOCK FLUSH 100 UNIT/ML IV SOLN
500.0000 [IU] | Freq: Once | INTRAVENOUS | Status: AC | PRN
  Administered 2016-08-23: 500 [IU]
  Filled 2016-08-23: qty 5

## 2016-08-24 ENCOUNTER — Ambulatory Visit (HOSPITAL_BASED_OUTPATIENT_CLINIC_OR_DEPARTMENT_OTHER): Payer: Medicare Other

## 2016-08-24 VITALS — BP 121/71 | HR 85 | Temp 97.7°F | Resp 16

## 2016-08-24 DIAGNOSIS — D4620 Refractory anemia with excess of blasts, unspecified: Secondary | ICD-10-CM

## 2016-08-24 DIAGNOSIS — D4621 Refractory anemia with excess of blasts 1: Secondary | ICD-10-CM | POA: Diagnosis not present

## 2016-08-24 DIAGNOSIS — Z5111 Encounter for antineoplastic chemotherapy: Secondary | ICD-10-CM

## 2016-08-24 MED ORDER — SODIUM CHLORIDE 0.9 % IV SOLN
20.0000 mg/m2 | Freq: Once | INTRAVENOUS | Status: AC
  Administered 2016-08-24: 45 mg via INTRAVENOUS
  Filled 2016-08-24: qty 9

## 2016-08-24 MED ORDER — HEPARIN SOD (PORK) LOCK FLUSH 100 UNIT/ML IV SOLN
500.0000 [IU] | Freq: Once | INTRAVENOUS | Status: DC | PRN
  Filled 2016-08-24: qty 5

## 2016-08-24 MED ORDER — SODIUM CHLORIDE 0.9 % IV SOLN
Freq: Once | INTRAVENOUS | Status: AC
  Administered 2016-08-24: 10:00:00 via INTRAVENOUS

## 2016-08-24 MED ORDER — PROCHLORPERAZINE MALEATE 10 MG PO TABS
ORAL_TABLET | ORAL | Status: AC
  Filled 2016-08-24: qty 1

## 2016-08-24 MED ORDER — SODIUM CHLORIDE 0.9% FLUSH
10.0000 mL | INTRAVENOUS | Status: DC | PRN
  Filled 2016-08-24: qty 10

## 2016-08-24 MED ORDER — PROCHLORPERAZINE MALEATE 10 MG PO TABS
10.0000 mg | ORAL_TABLET | Freq: Once | ORAL | Status: AC
  Administered 2016-08-24: 10 mg via ORAL

## 2016-08-24 NOTE — Patient Instructions (Signed)
Decitabine injection for infusion What is this medicine? DECITABINE (dee SYE ta been) is a chemotherapy drug. This medicine reduces the growth of cancer cells. It is used to treat adults with myelodysplastic syndromes. This medicine may be used for other purposes; ask your health care provider or pharmacist if you have questions. COMMON BRAND NAME(S): Dacogen What should I tell my health care provider before I take this medicine? They need to know if you have any of these conditions: -infection (especially a virus infection such as chickenpox, cold sores, or herpes) -kidney disease -liver disease -an unusual or allergic reaction to decitabine, other medicines, foods, dyes, or preservatives -pregnant or trying to get pregnant -breast-feeding How should I use this medicine? This medicine is for infusion into a vein. It is administered in a hospital or clinic by a doctor or health care professional. Talk to your pediatrician regarding the use of this medicine in children. Special care may be needed. Overdosage: If you think you have taken too much of this medicine contact a poison control center or emergency room at once. NOTE: This medicine is only for you. Do not share this medicine with others. What if I miss a dose? It is important not to miss your dose. Call your doctor or health care professional if you are unable to keep an appointment. What may interact with this medicine? -vaccines Talk to your doctor or health care professional before taking any of these medicines: -aspirin -acetaminophen -ibuprofen -ketoprofen -naproxen This list may not describe all possible interactions. Give your health care provider a list of all the medicines, herbs, non-prescription drugs, or dietary supplements you use. Also tell them if you smoke, drink alcohol, or use illegal drugs. Some items may interact with your medicine. What should I watch for while using this medicine? Visit your doctor for  checks on your progress. This drug may make you feel generally unwell. This is not uncommon, as chemotherapy can affect healthy cells as well as cancer cells. Report any side effects. Continue your course of treatment even though you feel ill unless your doctor tells you to stop. In some cases, you may be given additional medicines to help with side effects. Follow all directions for their use. Call your doctor or health care professional for advice if you get a fever, chills or sore throat, or other symptoms of a cold or flu. Do not treat yourself. This drug decreases your body's ability to fight infections. Try to avoid being around people who are sick. This medicine may increase your risk to bruise or bleed. Call your doctor or health care professional if you notice any unusual bleeding. Do not become pregnant while taking this medicine or for at least 1 month after stopping it. Women should inform their doctor if they wish to become pregnant or think they might be pregnant. Men should not father a child while taking this medicine and for at least 2 months after stopping it. There is a potential for serious side effects to an unborn child. Talk to your health care professional or pharmacist for more information. Do not breast-feed an infant while taking this medicine. What side effects may I notice from receiving this medicine? Side effects that you should report to your doctor or health care professional as soon as possible: -low blood counts - this medicine may decrease the number of white blood cells, red blood cells and platelets. You may be at increased risk for infections and bleeding. -signs of infection - fever or   chills, cough, sore throat, pain or difficulty passing urine -signs of decreased platelets or bleeding - bruising, pinpoint red spots on the skin, black, tarry stools, blood in the urine -signs of decreased red blood cells - unusual weakness or tiredness, fainting spells,  lightheadedness -increased blood sugar Side effects that usually do not require medical attention (report to your doctor or health care professional if they continue or are bothersome): -constipation -diarrhea -headache -loss of appetite -nausea, vomiting -skin rash, itching -stomach pain -water retention -weak or tired This list may not describe all possible side effects. Call your doctor for medical advice about side effects. You may report side effects to FDA at 1-800-FDA-1088. Where should I keep my medicine? This drug is given in a hospital or clinic and will not be stored at home. NOTE: This sheet is a summary. It may not cover all possible information. If you have questions about this medicine, talk to your doctor, pharmacist, or health care provider.  2018 Elsevier/Gold Standard (2015-06-30 15:52:57)  

## 2016-09-10 ENCOUNTER — Other Ambulatory Visit: Payer: Self-pay

## 2016-09-10 DIAGNOSIS — D4620 Refractory anemia with excess of blasts, unspecified: Secondary | ICD-10-CM

## 2016-09-10 DIAGNOSIS — R110 Nausea: Secondary | ICD-10-CM

## 2016-09-10 DIAGNOSIS — F419 Anxiety disorder, unspecified: Secondary | ICD-10-CM

## 2016-09-10 MED ORDER — LORAZEPAM 0.5 MG PO TABS: 0.5000 mg | ORAL_TABLET | Freq: Four times a day (QID) | ORAL | 0 refills | Status: DC | PRN

## 2016-09-24 ENCOUNTER — Ambulatory Visit (HOSPITAL_BASED_OUTPATIENT_CLINIC_OR_DEPARTMENT_OTHER): Payer: Medicare Other | Admitting: Hematology & Oncology

## 2016-09-24 ENCOUNTER — Ambulatory Visit (HOSPITAL_BASED_OUTPATIENT_CLINIC_OR_DEPARTMENT_OTHER): Payer: Medicare Other

## 2016-09-24 ENCOUNTER — Other Ambulatory Visit (HOSPITAL_BASED_OUTPATIENT_CLINIC_OR_DEPARTMENT_OTHER): Payer: Medicare Other

## 2016-09-24 ENCOUNTER — Ambulatory Visit: Payer: Medicare Other

## 2016-09-24 VITALS — BP 124/60 | HR 94 | Temp 97.5°F | Resp 18 | Wt 211.5 lb

## 2016-09-24 DIAGNOSIS — D4620 Refractory anemia with excess of blasts, unspecified: Secondary | ICD-10-CM

## 2016-09-24 DIAGNOSIS — D4621 Refractory anemia with excess of blasts 1: Secondary | ICD-10-CM

## 2016-09-24 DIAGNOSIS — M545 Low back pain: Secondary | ICD-10-CM | POA: Diagnosis not present

## 2016-09-24 DIAGNOSIS — Z5111 Encounter for antineoplastic chemotherapy: Secondary | ICD-10-CM

## 2016-09-24 DIAGNOSIS — D509 Iron deficiency anemia, unspecified: Secondary | ICD-10-CM

## 2016-09-24 LAB — CBC WITH DIFFERENTIAL (CANCER CENTER ONLY)
BASO#: 0.1 10*3/uL (ref 0.0–0.2)
BASO%: 1.3 % (ref 0.0–2.0)
EOS%: 0.3 % (ref 0.0–7.0)
Eosinophils Absolute: 0 10*3/uL (ref 0.0–0.5)
HCT: 37.2 % — ABNORMAL LOW (ref 38.7–49.9)
HGB: 12.5 g/dL — ABNORMAL LOW (ref 13.0–17.1)
LYMPH#: 0.9 10*3/uL (ref 0.9–3.3)
LYMPH%: 11.8 % — AB (ref 14.0–48.0)
MCH: 34.3 pg — ABNORMAL HIGH (ref 28.0–33.4)
MCHC: 33.6 g/dL (ref 32.0–35.9)
MCV: 102 fL — AB (ref 82–98)
MONO#: 0.7 10*3/uL (ref 0.1–0.9)
MONO%: 8.6 % (ref 0.0–13.0)
NEUT#: 6.2 10*3/uL (ref 1.5–6.5)
NEUT%: 78 % (ref 40.0–80.0)
PLATELETS: 336 10*3/uL (ref 145–400)
RBC: 3.64 10*6/uL — ABNORMAL LOW (ref 4.20–5.70)
RDW: 15.2 % (ref 11.1–15.7)
WBC: 7.9 10*3/uL (ref 4.0–10.0)

## 2016-09-24 LAB — CMP (CANCER CENTER ONLY)
ALT: 26 U/L (ref 10–47)
AST: 23 U/L (ref 11–38)
Albumin: 3.4 g/dL (ref 3.3–5.5)
Alkaline Phosphatase: 52 U/L (ref 26–84)
BUN, Bld: 19 mg/dL (ref 7–22)
CHLORIDE: 105 meq/L (ref 98–108)
CO2: 28 mEq/L (ref 18–33)
Calcium: 8.6 mg/dL (ref 8.0–10.3)
Creat: 1.4 mg/dl — ABNORMAL HIGH (ref 0.6–1.2)
Glucose, Bld: 88 mg/dL (ref 73–118)
POTASSIUM: 3.8 meq/L (ref 3.3–4.7)
Sodium: 141 mEq/L (ref 128–145)
Total Bilirubin: 0.5 mg/dl (ref 0.20–1.60)
Total Protein: 6.6 g/dL (ref 6.4–8.1)

## 2016-09-24 LAB — IRON AND TIBC
%SAT: 26 % (ref 20–55)
IRON: 71 ug/dL (ref 42–163)
TIBC: 275 ug/dL (ref 202–409)
UIBC: 204 ug/dL (ref 117–376)

## 2016-09-24 LAB — FERRITIN

## 2016-09-24 LAB — LACTATE DEHYDROGENASE: LDH: 223 U/L (ref 125–245)

## 2016-09-24 LAB — TECHNOLOGIST REVIEW CHCC SATELLITE

## 2016-09-24 LAB — CHCC SATELLITE - SMEAR

## 2016-09-24 MED ORDER — HEPARIN SOD (PORK) LOCK FLUSH 100 UNIT/ML IV SOLN
500.0000 [IU] | Freq: Once | INTRAVENOUS | Status: AC | PRN
  Administered 2016-09-24: 500 [IU]
  Filled 2016-09-24: qty 5

## 2016-09-24 MED ORDER — PROCHLORPERAZINE MALEATE 10 MG PO TABS
10.0000 mg | ORAL_TABLET | Freq: Once | ORAL | Status: AC
  Administered 2016-09-24: 10 mg via ORAL

## 2016-09-24 MED ORDER — SODIUM CHLORIDE 0.9 % IV SOLN
20.0000 mg/m2 | Freq: Once | INTRAVENOUS | Status: AC
  Administered 2016-09-24: 45 mg via INTRAVENOUS
  Filled 2016-09-24: qty 9

## 2016-09-24 MED ORDER — SODIUM CHLORIDE 0.9 % IV SOLN
Freq: Once | INTRAVENOUS | Status: AC
  Administered 2016-09-24: 11:00:00 via INTRAVENOUS

## 2016-09-24 MED ORDER — SODIUM CHLORIDE 0.9% FLUSH
10.0000 mL | INTRAVENOUS | Status: DC | PRN
  Administered 2016-09-24: 10 mL
  Filled 2016-09-24: qty 10

## 2016-09-24 MED ORDER — PROCHLORPERAZINE MALEATE 10 MG PO TABS
ORAL_TABLET | ORAL | Status: AC
  Filled 2016-09-24: qty 1

## 2016-09-24 NOTE — Patient Instructions (Signed)
[facility] Discharge Instructions for Patients Receiving Chemotherapy  Today you received the following chemotherapy agents Dacogen  To help prevent nausea and vomiting after your treatment, we encourage you to take your nausea medication as prescribed by MD.   If you develop nausea and vomiting that is not controlled by your nausea medication, call the clinic.   BELOW ARE SYMPTOMS THAT SHOULD BE REPORTED IMMEDIATELY:  *FEVER GREATER THAN 100.5 F  *CHILLS WITH OR WITHOUT FEVER  NAUSEA AND VOMITING THAT IS NOT CONTROLLED WITH YOUR NAUSEA MEDICATION  *UNUSUAL SHORTNESS OF BREATH  *UNUSUAL BRUISING OR BLEEDING  TENDERNESS IN MOUTH AND THROAT WITH OR WITHOUT PRESENCE OF ULCERS  *URINARY PROBLEMS  *BOWEL PROBLEMS  UNUSUAL RASH Items with * indicate a potential emergency and should be followed up as soon as possible.  Feel free to call the clinic you have any questions or concerns. The clinic phone number is [phone].  Please show the [facility] at check-in to the Emergency Department and triage nurse.

## 2016-09-24 NOTE — Progress Notes (Signed)
Hematology and Oncology Follow Up Visit  Anthony [name] [id number] 02/13/1941 76 y.o. 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 He is doing quite well. He still has some dizziness. I don't think this is anything related to his myelodysplasia. He also has some lower back issues. It sounds like spinal stenosis. He will see Dr. Rolena Infante of [facility] orthopedic surgery in a week or so.  He has done incredibly well with his decitabine. His blood counts have normalized. We have not had to transfuse him for over a year.  He has had no rashes. His appetite is good. He's had no cough. He's had no mouth sores. He's had no change in bowel or bladder habits.  Overall, his performance status is ECOG 1.    Medications:  Allergies as of 09/24/2016   No Known Allergies     Medication List       Accurate as of 09/24/16 11:10 AM. Always use your most recent med list.          bimatoprost 0.01 % Soln Commonly known as:  LUMIGAN Place 1 drop into both eyes at bedtime.   budesonide-formoterol 160-4.5 MCG/ACT inhaler Commonly known as:  SYMBICORT Inhale 2 puffs into the lungs 2 (two) times daily.   buPROPion 150 MG 24 hr tablet Commonly known as:  WELLBUTRIN XL Take 150 mg by mouth daily.   clopidogrel 75 MG tablet Commonly known as:  PLAVIX Take 75 mg by mouth daily.   co-enzyme Q-10 30 MG capsule Take 100 mg by mouth daily.   Cyanocobalamin 2500 MCG Tabs Take 5,000 mcg by mouth daily.   fish oil-omega-3 fatty acids 1000 MG capsule Take 1 g by mouth daily.   glucosamine-chondroitin 500-400 MG tablet Take 1 tablet by mouth daily.     LORazepam 0.5 MG tablet Commonly known as:  ATIVAN Take 1 tablet (0.5 mg total) by mouth every 6 (six) hours as needed (Nausea or vomiting).   magnesium oxide 400 MG tablet Commonly known as:  MAG-OX Take 400 mg by mouth daily.   orphenadrine 100 MG tablet Commonly known as:  NORFLEX Take 1 tablet (100 mg total) by mouth 2 (two) times daily as needed for muscle spasms.   pantoprazole 40 MG tablet Commonly known as:  PROTONIX Take 40 mg by mouth daily.   predniSONE 20 MG tablet Commonly known as:  DELTASONE Take 1 tablet (20 mg total) by mouth daily with breakfast.   PROAIR RESPICLICK 440 (90 Base) MCG/ACT Aepb Generic drug:  Albuterol Sulfate Inhale 2 puffs into the lungs every 6 (six) hours as needed.   prochlorperazine 10 MG tablet Commonly known as:  COMPAZINE Take 1 tablet (10 mg total) by mouth every 6 (six) hours as needed (Nausea or vomiting).   ranitidine 300 MG tablet Commonly known as:  ZANTAC Take 300 mg by mouth at bedtime.   sertraline 100 MG tablet Commonly known as:  ZOLOFT Take 100 mg by mouth daily.   SIMBRINZA 1-0.2 % Susp Generic drug:  Brinzolamide-Brimonidine   simvastatin 40 MG tablet Commonly known as:  ZOCOR Take 40 mg by  mouth every other day.   tiotropium 18 MCG inhalation capsule Commonly known as:  SPIRIVA Place 18 mcg into inhaler and inhale daily.   Vitamin D3 5000 units Tabs Take by mouth daily.       Allergies: No Known Allergies  Past Medical History, Surgical history, Social history, and Family History were reviewed and updated.  Review of Systems: All other 10 point review of systems is negative.   Physical Exam:  weight is 211 lb 8 oz (95.9 kg). His oral temperature is 97.5 F (36.4 C). His blood pressure is 124/60 and his pulse is 94. His respiration is 18 and oxygen saturation is 99%.   Wt Readings from Last 3 Encounters:  09/24/16 211 lb 8 oz (95.9 kg)  08/20/16 212 lb (96.2 kg)  07/16/16 213 lb (96.6 kg)     Well-developed and well-nourished white male in no obvious distress. Head and neck exam shows no ocular or oral lesions. He has no palpable cervical or supraclavicular lymph nodes. Lungs are clear. Cardiac exam regular rate and rhythm with no murmurs, rubs or bruits. Abdomen is soft. He has good bowel sounds. There is no fluid wave. There is no palpable liver or spleen tip. Back exam shows no tenderness over the spine, ribs or hips. Extremities shows no clubbing, cyanosis or edema. Skin exam shows no rashes, ecchymoses or petechia. Neurological exam shows no focal neurological deficits. oriented, appropriate affect  Lab Results  Component Value Date   WBC 7.9 09/24/2016   HGB 12.5 (L) 09/24/2016   HCT 37.2 (L) 09/24/2016   MCV 102 (H) 09/24/2016   PLT 336 09/24/2016   Lab Results  Component Value Date   FERRITIN 2,046 (H) 08/20/2016   IRON 69 08/20/2016   TIBC 263 08/20/2016   UIBC 193 08/20/2016   IRONPCTSAT 26 08/20/2016   Lab Results  Component Value Date   RETICCTPCT 1.7 05/30/2015   RBC 3.64 (L) 09/24/2016   RETICCTABS 39.4 05/30/2015   No results found for: KPAFRELGTCHN, LAMBDASER, KAPLAMBRATIO No results found for: Os[facility] Lab Results  Component Value Date   TOTALPROTELP 7.3 01/01/2014   ALBUMINELP 55.1 (L) 01/01/2014   A1GS 6.9 (H) 01/01/2014   A2GS 8.9 01/01/2014   BETS 7.9 (H) 01/01/2014   BETA2SER 6.7 (H) 01/01/2014   GAMS 14.5 01/01/2014   MSPIKE NOT DET 01/01/2014   SPEI * 01/01/2014     Chemistry      Component Value Date/Time   NA 143 08/20/2016 0945   NA 140 04/16/2016 0930   K 4.0 08/20/2016 0945   K 3.9 04/16/2016 0930   CL 106 08/20/2016 0945   CO2 26 08/20/2016 0945   CO2 24 04/16/2016 0930   BUN 21 08/20/2016 0945   BUN 20.5 04/16/2016 0930   CREATININE 1.4 (H) 08/20/2016 0945   CREATININE 1.2 04/16/2016 0930      Component Value Date/Time   CALCIUM 8.8 08/20/2016 0945   CALCIUM 8.9 04/16/2016 0930   ALKPHOS 44  08/20/2016 0945   ALKPHOS 54 04/16/2016 0930   AST 28 08/20/2016 0945   AST 16 04/16/2016 0930   ALT 31 08/20/2016 0945   ALT 21 04/16/2016 0930   BILITOT 0.50 08/20/2016 0945   BILITOT 0.27 04/16/2016 0930     Impression and Plan: Anthony [name] is 76 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   He is still responding quite nicely. I'm just glad that he is doing so well. Again, he is responded very nicely to  therapy.  I will now give him 5 weeks off in between treatments. I think we can still keep him in remission. He is only getting 5 days of decitabine.  If, for some reason, he actually needs have spinal surgery, I don't think that this will be an issue. To me, is only taking may need some epidural steroids.  We will plan to get him back in 6 more weeks now.   Volanda Napoleon, MD 4/16/201811:10 AM  I

## 2016-09-25 ENCOUNTER — Ambulatory Visit (HOSPITAL_BASED_OUTPATIENT_CLINIC_OR_DEPARTMENT_OTHER): Payer: Medicare Other

## 2016-09-25 DIAGNOSIS — D4621 Refractory anemia with excess of blasts 1: Secondary | ICD-10-CM

## 2016-09-25 DIAGNOSIS — Z5111 Encounter for antineoplastic chemotherapy: Secondary | ICD-10-CM

## 2016-09-25 DIAGNOSIS — D4620 Refractory anemia with excess of blasts, unspecified: Secondary | ICD-10-CM

## 2016-09-25 MED ORDER — SODIUM CHLORIDE 0.9% FLUSH
10.0000 mL | INTRAVENOUS | Status: DC | PRN
  Administered 2016-09-25: 10 mL
  Filled 2016-09-25: qty 10

## 2016-09-25 MED ORDER — PROCHLORPERAZINE MALEATE 10 MG PO TABS
ORAL_TABLET | ORAL | Status: AC
  Filled 2016-09-25: qty 1

## 2016-09-25 MED ORDER — HEPARIN SOD (PORK) LOCK FLUSH 100 UNIT/ML IV SOLN
500.0000 [IU] | Freq: Once | INTRAVENOUS | Status: AC | PRN
  Administered 2016-09-25: 500 [IU]
  Filled 2016-09-25: qty 5

## 2016-09-25 MED ORDER — PROCHLORPERAZINE MALEATE 10 MG PO TABS
10.0000 mg | ORAL_TABLET | Freq: Once | ORAL | Status: AC
  Administered 2016-09-25: 10 mg via ORAL

## 2016-09-25 MED ORDER — SODIUM CHLORIDE 0.9 % IV SOLN
20.0000 mg/m2 | Freq: Once | INTRAVENOUS | Status: AC
  Administered 2016-09-25: 45 mg via INTRAVENOUS
  Filled 2016-09-25: qty 9

## 2016-09-25 MED ORDER — SODIUM CHLORIDE 0.9 % IV SOLN: Freq: Once | INTRAVENOUS | Status: DC

## 2016-09-25 NOTE — Patient Instructions (Signed)
[facility] Discharge Instructions for Patients Receiving Chemotherapy  Today you received the following chemotherapy agents Dacogen  To help prevent nausea and vomiting after your treatment, we encourage you to take your nausea medication as prescribed by MD.   If you develop nausea and vomiting that is not controlled by your nausea medication, call the clinic.   BELOW ARE SYMPTOMS THAT SHOULD BE REPORTED IMMEDIATELY:  *FEVER GREATER THAN 100.5 F  *CHILLS WITH OR WITHOUT FEVER  NAUSEA AND VOMITING THAT IS NOT CONTROLLED WITH YOUR NAUSEA MEDICATION  *UNUSUAL SHORTNESS OF BREATH  *UNUSUAL BRUISING OR BLEEDING  TENDERNESS IN MOUTH AND THROAT WITH OR WITHOUT PRESENCE OF ULCERS  *URINARY PROBLEMS  *BOWEL PROBLEMS  UNUSUAL RASH Items with * indicate a potential emergency and should be followed up as soon as possible.  Feel free to call the clinic you have any questions or concerns. The clinic phone number is [phone].  Please show the [facility] at check-in to the Emergency Department and triage nurse.

## 2016-09-26 ENCOUNTER — Ambulatory Visit (HOSPITAL_BASED_OUTPATIENT_CLINIC_OR_DEPARTMENT_OTHER): Payer: Medicare Other

## 2016-09-26 VITALS — BP 115/64 | HR 90 | Temp 97.5°F | Resp 18

## 2016-09-26 DIAGNOSIS — D4621 Refractory anemia with excess of blasts 1: Secondary | ICD-10-CM | POA: Diagnosis not present

## 2016-09-26 DIAGNOSIS — D4620 Refractory anemia with excess of blasts, unspecified: Secondary | ICD-10-CM

## 2016-09-26 MED ORDER — PROCHLORPERAZINE MALEATE 10 MG PO TABS
10.0000 mg | ORAL_TABLET | Freq: Once | ORAL | Status: AC
  Administered 2016-09-26: 10 mg via ORAL

## 2016-09-26 MED ORDER — SODIUM CHLORIDE 0.9% FLUSH
10.0000 mL | INTRAVENOUS | Status: DC | PRN
  Administered 2016-09-26: 10 mL
  Filled 2016-09-26: qty 10

## 2016-09-26 MED ORDER — PROCHLORPERAZINE MALEATE 10 MG PO TABS
ORAL_TABLET | ORAL | Status: AC
  Filled 2016-09-26: qty 1

## 2016-09-26 MED ORDER — HEPARIN SOD (PORK) LOCK FLUSH 100 UNIT/ML IV SOLN
500.0000 [IU] | Freq: Once | INTRAVENOUS | Status: AC | PRN
  Administered 2016-09-26: 500 [IU]
  Filled 2016-09-26: qty 5

## 2016-09-26 MED ORDER — SODIUM CHLORIDE 0.9 % IV SOLN
20.0000 mg/m2 | Freq: Once | INTRAVENOUS | Status: AC
  Administered 2016-09-26: 45 mg via INTRAVENOUS
  Filled 2016-09-26: qty 9

## 2016-09-26 MED ORDER — SODIUM CHLORIDE 0.9 % IV SOLN
Freq: Once | INTRAVENOUS | Status: AC
  Administered 2016-09-26: 10:00:00 via INTRAVENOUS

## 2016-09-27 ENCOUNTER — Ambulatory Visit (HOSPITAL_BASED_OUTPATIENT_CLINIC_OR_DEPARTMENT_OTHER): Payer: Medicare Other

## 2016-09-27 VITALS — BP 120/62 | HR 88 | Temp 98.0°F | Resp 18

## 2016-09-27 DIAGNOSIS — D4620 Refractory anemia with excess of blasts, unspecified: Secondary | ICD-10-CM

## 2016-09-27 DIAGNOSIS — Z5111 Encounter for antineoplastic chemotherapy: Secondary | ICD-10-CM | POA: Diagnosis not present

## 2016-09-27 DIAGNOSIS — D4621 Refractory anemia with excess of blasts 1: Secondary | ICD-10-CM | POA: Diagnosis not present

## 2016-09-27 MED ORDER — PROCHLORPERAZINE MALEATE 10 MG PO TABS
10.0000 mg | ORAL_TABLET | Freq: Once | ORAL | Status: AC
  Administered 2016-09-27: 10 mg via ORAL

## 2016-09-27 MED ORDER — SODIUM CHLORIDE 0.9 % IV SOLN
Freq: Once | INTRAVENOUS | Status: AC
  Administered 2016-09-27: 10:00:00 via INTRAVENOUS

## 2016-09-27 MED ORDER — SODIUM CHLORIDE 0.9% FLUSH
10.0000 mL | INTRAVENOUS | Status: DC | PRN
  Administered 2016-09-27: 10 mL
  Filled 2016-09-27: qty 10

## 2016-09-27 MED ORDER — PROCHLORPERAZINE MALEATE 10 MG PO TABS
ORAL_TABLET | ORAL | Status: AC
  Filled 2016-09-27: qty 1

## 2016-09-27 MED ORDER — DECITABINE CHEMO INJECTION 50 MG
20.0000 mg/m2 | Freq: Once | INTRAVENOUS | Status: AC
  Administered 2016-09-27: 45 mg via INTRAVENOUS
  Filled 2016-09-27: qty 9

## 2016-09-27 MED ORDER — HEPARIN SOD (PORK) LOCK FLUSH 100 UNIT/ML IV SOLN
500.0000 [IU] | Freq: Once | INTRAVENOUS | Status: AC | PRN
  Administered 2016-09-27: 500 [IU]
  Filled 2016-09-27: qty 5

## 2016-09-27 NOTE — Patient Instructions (Signed)
[facility] Discharge Instructions for Patients Receiving Chemotherapy  Today you received the following chemotherapy agents Decitabine  To help prevent nausea and vomiting after your treatment, we encourage you to take your nausea medication    If you develop nausea and vomiting that is not controlled by your nausea medication, call the clinic.   BELOW ARE SYMPTOMS THAT SHOULD BE REPORTED IMMEDIATELY:  *FEVER GREATER THAN 100.5 F  *CHILLS WITH OR WITHOUT FEVER  NAUSEA AND VOMITING THAT IS NOT CONTROLLED WITH YOUR NAUSEA MEDICATION  *UNUSUAL SHORTNESS OF BREATH  *UNUSUAL BRUISING OR BLEEDING  TENDERNESS IN MOUTH AND THROAT WITH OR WITHOUT PRESENCE OF ULCERS  *URINARY PROBLEMS  *BOWEL PROBLEMS  UNUSUAL RASH Items with * indicate a potential emergency and should be followed up as soon as possible.  Feel free to call the clinic you have any questions or concerns. The clinic phone number is [phone].  Please show the [place] [facility] at check-in to the Emergency Department and triage nurse.

## 2016-09-28 ENCOUNTER — Ambulatory Visit (HOSPITAL_BASED_OUTPATIENT_CLINIC_OR_DEPARTMENT_OTHER): Payer: Medicare Other

## 2016-09-28 VITALS — BP 103/49 | HR 85 | Temp 97.6°F | Resp 18

## 2016-09-28 DIAGNOSIS — D4620 Refractory anemia with excess of blasts, unspecified: Secondary | ICD-10-CM

## 2016-09-28 DIAGNOSIS — D4621 Refractory anemia with excess of blasts 1: Secondary | ICD-10-CM | POA: Diagnosis not present

## 2016-09-28 DIAGNOSIS — Z5111 Encounter for antineoplastic chemotherapy: Secondary | ICD-10-CM

## 2016-09-28 MED ORDER — SODIUM CHLORIDE 0.9% FLUSH
10.0000 mL | INTRAVENOUS | Status: DC | PRN
  Administered 2016-09-28: 10 mL
  Filled 2016-09-28: qty 10

## 2016-09-28 MED ORDER — PROCHLORPERAZINE MALEATE 10 MG PO TABS
10.0000 mg | ORAL_TABLET | Freq: Once | ORAL | Status: AC
  Administered 2016-09-28: 10 mg via ORAL

## 2016-09-28 MED ORDER — HEPARIN SOD (PORK) LOCK FLUSH 100 UNIT/ML IV SOLN
500.0000 [IU] | Freq: Once | INTRAVENOUS | Status: AC | PRN
  Administered 2016-09-28: 500 [IU]
  Filled 2016-09-28: qty 5

## 2016-09-28 MED ORDER — SODIUM CHLORIDE 0.9 % IV SOLN
Freq: Once | INTRAVENOUS | Status: AC
  Administered 2016-09-28: 10:00:00 via INTRAVENOUS

## 2016-09-28 MED ORDER — SODIUM CHLORIDE 0.9 % IV SOLN
20.0000 mg/m2 | Freq: Once | INTRAVENOUS | Status: AC
  Administered 2016-09-28: 45 mg via INTRAVENOUS
  Filled 2016-09-28: qty 9

## 2016-09-28 MED ORDER — PROCHLORPERAZINE MALEATE 10 MG PO TABS
ORAL_TABLET | ORAL | Status: AC
  Filled 2016-09-28: qty 1

## 2016-09-28 NOTE — Patient Instructions (Signed)
[facility] Discharge Instructions for Patients Receiving Chemotherapy  Today you received the following chemotherapy agents Vidaza  To help prevent nausea and vomiting after your treatment, we encourage you to take your nausea medication   If you develop nausea and vomiting that is not controlled by your nausea medication, call the clinic.   BELOW ARE SYMPTOMS THAT SHOULD BE REPORTED IMMEDIATELY:  *FEVER GREATER THAN 100.5 F  *CHILLS WITH OR WITHOUT FEVER  NAUSEA AND VOMITING THAT IS NOT CONTROLLED WITH YOUR NAUSEA MEDICATION  *UNUSUAL SHORTNESS OF BREATH  *UNUSUAL BRUISING OR BLEEDING  TENDERNESS IN MOUTH AND THROAT WITH OR WITHOUT PRESENCE OF ULCERS  *URINARY PROBLEMS  *BOWEL PROBLEMS  UNUSUAL RASH Items with * indicate a potential emergency and should be followed up as soon as possible.  Feel free to call the clinic you have any questions or concerns. The clinic phone number is [phone].  Please show the [facility] C[facility] at check-in to the Emergency Department and triage nurse.   

## 2016-10-29 ENCOUNTER — Other Ambulatory Visit: Payer: Medicare Other

## 2016-10-29 ENCOUNTER — Ambulatory Visit: Payer: Medicare Other

## 2016-10-29 ENCOUNTER — Ambulatory Visit: Payer: Medicare Other | Admitting: Hematology & Oncology

## 2016-10-30 ENCOUNTER — Ambulatory Visit: Payer: Medicare Other | Admitting: Hematology & Oncology

## 2016-10-30 ENCOUNTER — Other Ambulatory Visit: Payer: Medicare Other

## 2016-10-30 ENCOUNTER — Ambulatory Visit: Payer: Medicare Other

## 2016-10-31 ENCOUNTER — Ambulatory Visit: Payer: Medicare Other

## 2016-11-01 ENCOUNTER — Ambulatory Visit: Payer: Medicare Other

## 2016-11-02 ENCOUNTER — Ambulatory Visit: Payer: Medicare Other

## 2016-11-06 ENCOUNTER — Other Ambulatory Visit (HOSPITAL_BASED_OUTPATIENT_CLINIC_OR_DEPARTMENT_OTHER): Payer: Medicare Other

## 2016-11-06 ENCOUNTER — Other Ambulatory Visit: Payer: Self-pay | Admitting: *Deleted

## 2016-11-06 ENCOUNTER — Ambulatory Visit: Payer: Medicare Other

## 2016-11-06 ENCOUNTER — Ambulatory Visit (HOSPITAL_BASED_OUTPATIENT_CLINIC_OR_DEPARTMENT_OTHER): Payer: Medicare Other

## 2016-11-06 ENCOUNTER — Ambulatory Visit (HOSPITAL_BASED_OUTPATIENT_CLINIC_OR_DEPARTMENT_OTHER): Payer: Medicare Other | Admitting: Hematology & Oncology

## 2016-11-06 VITALS — BP 126/58 | HR 84 | Temp 98.0°F | Resp 16 | Wt 214.8 lb

## 2016-11-06 DIAGNOSIS — F419 Anxiety disorder, unspecified: Secondary | ICD-10-CM

## 2016-11-06 DIAGNOSIS — D4620 Refractory anemia with excess of blasts, unspecified: Secondary | ICD-10-CM

## 2016-11-06 DIAGNOSIS — Z5111 Encounter for antineoplastic chemotherapy: Secondary | ICD-10-CM | POA: Diagnosis not present

## 2016-11-06 DIAGNOSIS — D4621 Refractory anemia with excess of blasts 1: Secondary | ICD-10-CM | POA: Diagnosis not present

## 2016-11-06 DIAGNOSIS — D46Z Other myelodysplastic syndromes: Secondary | ICD-10-CM

## 2016-11-06 DIAGNOSIS — D508 Other iron deficiency anemias: Secondary | ICD-10-CM

## 2016-11-06 DIAGNOSIS — R110 Nausea: Secondary | ICD-10-CM

## 2016-11-06 LAB — CBC WITH DIFFERENTIAL (CANCER CENTER ONLY)
BASO#: 0.1 10*3/uL (ref 0.0–0.2)
BASO%: 0.6 % (ref 0.0–2.0)
EOS ABS: 0.1 10*3/uL (ref 0.0–0.5)
EOS%: 0.9 % (ref 0.0–7.0)
HEMATOCRIT: 38.9 % (ref 38.7–49.9)
HEMOGLOBIN: 12.6 g/dL — AB (ref 13.0–17.1)
LYMPH#: 1.1 10*3/uL (ref 0.9–3.3)
LYMPH%: 13.5 % — ABNORMAL LOW (ref 14.0–48.0)
MCH: 33.3 pg (ref 28.0–33.4)
MCHC: 32.4 g/dL (ref 32.0–35.9)
MCV: 103 fL — ABNORMAL HIGH (ref 82–98)
MONO#: 1 10*3/uL — AB (ref 0.1–0.9)
MONO%: 12.1 % (ref 0.0–13.0)
NEUT%: 72.9 % (ref 40.0–80.0)
NEUTROS ABS: 5.9 10*3/uL (ref 1.5–6.5)
Platelets: 285 10*3/uL (ref 145–400)
RBC: 3.78 10*6/uL — AB (ref 4.20–5.70)
RDW: 15.7 % (ref 11.1–15.7)
WBC: 8.1 10*3/uL (ref 4.0–10.0)

## 2016-11-06 LAB — CMP (CANCER CENTER ONLY)
ALBUMIN: 3.2 g/dL — AB (ref 3.3–5.5)
ALT(SGPT): 28 U/L (ref 10–47)
AST: 23 U/L (ref 11–38)
Alkaline Phosphatase: 40 U/L (ref 26–84)
BILIRUBIN TOTAL: 0.5 mg/dL (ref 0.20–1.60)
BUN, Bld: 17 mg/dL (ref 7–22)
CALCIUM: 8.3 mg/dL (ref 8.0–10.3)
CHLORIDE: 107 meq/L (ref 98–108)
CO2: 26 meq/L (ref 18–33)
CREATININE: 1.3 mg/dL — AB (ref 0.6–1.2)
Glucose, Bld: 102 mg/dL (ref 73–118)
Potassium: 3.9 mEq/L (ref 3.3–4.7)
SODIUM: 142 meq/L (ref 128–145)
Total Protein: 6.2 g/dL — ABNORMAL LOW (ref 6.4–8.1)

## 2016-11-06 LAB — IRON AND TIBC
%SAT: 34 % (ref 20–55)
IRON: 95 ug/dL (ref 42–163)
TIBC: 278 ug/dL (ref 202–409)
UIBC: 182 ug/dL (ref 117–376)

## 2016-11-06 LAB — FERRITIN

## 2016-11-06 LAB — TECHNOLOGIST REVIEW CHCC SATELLITE

## 2016-11-06 MED ORDER — HEPARIN SOD (PORK) LOCK FLUSH 100 UNIT/ML IV SOLN
500.0000 [IU] | Freq: Once | INTRAVENOUS | Status: AC | PRN
  Administered 2016-11-06: 500 [IU]
  Filled 2016-11-06: qty 5

## 2016-11-06 MED ORDER — ALTEPLASE 2 MG IJ SOLR
2.0000 mg | Freq: Once | INTRAMUSCULAR | Status: DC | PRN
Start: 2016-11-06 — End: 2016-11-06
  Filled 2016-11-06: qty 2

## 2016-11-06 MED ORDER — DECITABINE CHEMO INJECTION 50 MG
20.0000 mg/m2 | Freq: Once | INTRAVENOUS | Status: AC
  Administered 2016-11-06: 45 mg via INTRAVENOUS
  Filled 2016-11-06: qty 9

## 2016-11-06 MED ORDER — PROCHLORPERAZINE MALEATE 10 MG PO TABS
ORAL_TABLET | ORAL | Status: AC
  Filled 2016-11-06: qty 1

## 2016-11-06 MED ORDER — SODIUM CHLORIDE 0.9% FLUSH
10.0000 mL | INTRAVENOUS | Status: DC | PRN
  Administered 2016-11-06: 10 mL
  Filled 2016-11-06: qty 10

## 2016-11-06 MED ORDER — SODIUM CHLORIDE 0.9 % IV SOLN
Freq: Once | INTRAVENOUS | Status: AC
  Administered 2016-11-06: 10:00:00 via INTRAVENOUS

## 2016-11-06 MED ORDER — LORAZEPAM 0.5 MG PO TABS: 0.5000 mg | ORAL_TABLET | Freq: Four times a day (QID) | ORAL | 0 refills | Status: DC | PRN

## 2016-11-06 MED ORDER — PROCHLORPERAZINE MALEATE 10 MG PO TABS
10.0000 mg | ORAL_TABLET | Freq: Once | ORAL | Status: AC
  Administered 2016-11-06: 10 mg via ORAL

## 2016-11-06 MED FILL — LORazepam 0.5 MG TABS: 0.5 | 23 days supply | Qty: 90 | Fill #0

## 2016-11-06 NOTE — Progress Notes (Signed)
Hematology and Oncology Follow Up Visit  Anthony [name] [id number] [birth year] 76 y.o. 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 He is doing quite well. He really is not using his cane anymore. He has much less dizziness. He is much more stable. He is much more active. He does things outside the house now.  He has done incredibly well with treatment. He has not been transfused with blood since August of last year.  His last iron studies back in April showed a ferritin of 2000. His iron saturation was only 26%.  His appetite is good. He had a good Memorial Day weekend. He had no issues with nausea or vomiting. He's had no cough. He's had no fever.  Overall, his performance status is ECOG 1.    Medications:  Allergies as of 11/06/2016   No Known Allergies     Medication List       Accurate as of 11/06/16  9:18 AM. Always use your most recent med list.          bimatoprost 0.01 % Soln Commonly known as:  LUMIGAN Place 1 drop into both eyes at bedtime.   budesonide-formoterol 160-4.5 MCG/ACT inhaler Commonly known as:  SYMBICORT Inhale 2 puffs into the lungs 2 (two) times daily.   buPROPion 150 MG 24 hr tablet Commonly known as:  WELLBUTRIN XL Take 150 mg by mouth daily.   clopidogrel 75 MG tablet Commonly known as:  PLAVIX Take 75 mg by mouth daily.   co-enzyme Q-10 30 MG capsule Take 100 mg by mouth daily.   Cyanocobalamin 2500 MCG Tabs Take 5,000 mcg by mouth daily.   fish oil-omega-3 fatty acids 1000 MG capsule Take 1 g by mouth daily.   glucosamine-chondroitin 500-400 MG tablet Take 1 tablet by mouth daily.     LORazepam 0.5 MG tablet Commonly known as:  ATIVAN Take 1 tablet (0.5 mg total) by mouth every 6 (six) hours as needed (Nausea or vomiting).   magnesium oxide 400 MG tablet Commonly known as:  MAG-OX Take 400 mg by mouth daily.   orphenadrine 100 MG tablet Commonly known as:  NORFLEX Take 1 tablet (100 mg total) by mouth 2 (two) times daily as needed for muscle spasms.   pantoprazole 40 MG tablet Commonly known as:  PROTONIX Take 40 mg by mouth daily.   predniSONE 20 MG tablet Commonly known as:  DELTASONE Take 1 tablet (20 mg total) by mouth daily with breakfast.   PROAIR RESPICLICK 132 (90 Base) MCG/ACT Aepb Generic drug:  Albuterol Sulfate Inhale 2 puffs into the lungs every 6 (six) hours as needed.   prochlorperazine 10 MG tablet Commonly known as:  COMPAZINE Take 1 tablet (10 mg total) by mouth every 6 (six) hours as needed (Nausea or vomiting).   ranitidine 300 MG tablet Commonly known as:  ZANTAC Take 300 mg by mouth at bedtime.   sertraline 100 MG tablet Commonly known as:  ZOLOFT Take 100 mg by mouth daily.   SIMBRINZA 1-0.2 % Susp Generic drug:  Brinzolamide-Brimonidine   simvastatin 40 MG tablet Commonly known as:  ZOCOR  Take 40 mg by mouth every other day.   tiotropium 18 MCG inhalation capsule Commonly known as:  SPIRIVA Place 18 mcg into inhaler and inhale daily.   triamcinolone cream 0.1 % Commonly known as:  KENALOG APPLY TO RASH/ITCH ON ARMS TWICE DAILY AS NEEDED   Vitamin D3 5000 units Tabs Take by mouth daily.       Allergies: No Known Allergies  Past Medical History, Surgical history, Social history, and Family History were reviewed and updated.  Review of Systems: All other 10 point review of systems is negative.   Physical Exam:  weight is 214 lb 12.8 oz (97.4 kg). His oral temperature is 98 F (36.7 C). His blood pressure is 126/58 (abnormal) and his pulse is 84. His respiration is 16 and oxygen saturation is 99%.   Wt  Readings from Last 3 Encounters:  11/06/16 214 lb 12.8 oz (97.4 kg)  09/24/16 211 lb 8 oz (95.9 kg)  08/20/16 212 lb (96.2 kg)    Well-developed and well-nourished white male in no obvious distress. Head and neck exam shows no ocular or oral lesions. He has no palpable cervical or supraclavicular lymph nodes. Lungs are clear. Cardiac exam regular rate and rhythm with no murmurs, rubs or bruits. Abdomen is soft. He has good bowel sounds. There is no fluid wave. There is no palpable liver or spleen tip. Back exam shows no tenderness over the spine, ribs or hips. Extremities shows no clubbing, cyanosis or edema. Skin exam shows no rashes, ecchymoses or petechia. Neurological exam shows no focal neurological deficits. oriented, appropriate affect  Lab Results  Component Value Date   WBC 8.1 11/06/2016   HGB 12.6 (L) 11/06/2016   HCT 38.9 11/06/2016   MCV 103 (H) 11/06/2016   PLT 285 11/06/2016   Lab Results  Component Value Date   FERRITIN 2,055 (H) 09/24/2016   IRON 71 09/24/2016   TIBC 275 09/24/2016   UIBC 204 09/24/2016   IRONPCTSAT 26 09/24/2016   Lab Results  Component Value Date   RETICCTPCT 1.7 05/30/2015   RBC 3.78 (L) 11/06/2016   RETICCTABS 39.4 05/30/2015   No results found for: KPAFRELGTCHN, LAMBDASER, KAPLAMBRATIO No results found for: [facility] Lab Results  Component Value Date   TOTALPROTELP 7.3 01/01/2014   ALBUMINELP 55.1 (L) 01/01/2014   A1GS 6.9 (H) 01/01/2014   A2GS 8.9 01/01/2014   BETS 7.9 (H) 01/01/2014   BETA2SER 6.7 (H) 01/01/2014   GAMS 14.5 01/01/2014   MSPIKE NOT DET 01/01/2014   SPEI * 01/01/2014     Chemistry      Component Value Date/Time   NA 141 09/24/2016 0953   NA 140 04/16/2016 0930   K 3.8 09/24/2016 0953   K 3.9 04/16/2016 0930   CL 105 09/24/2016 0953   CO2 28 09/24/2016 0953   CO2 24 04/16/2016 0930   BUN 19 09/24/2016 0953   BUN 20.5 04/16/2016 0930   CREATININE 1.4 (H) 09/24/2016 0953   CREATININE 1.2  04/16/2016 0930      Component Value Date/Time   CALCIUM 8.6 09/24/2016 0953   CALCIUM 8.9 04/16/2016 0930   ALKPHOS 52 09/24/2016 0953   ALKPHOS 54 04/16/2016 0930   AST 23 09/24/2016 0953   AST 16 04/16/2016 0930   ALT 26 09/24/2016 0953   ALT 21 04/16/2016 0930   BILITOT 0.50 09/24/2016 0953   BILITOT 0.27 04/16/2016 0930     Impression and Plan: Anthony [name] is 76 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   He is still responding quite nicely. I'm just glad that he is doing so well. Again, he is responded very nicely to therapy.  I do not see need for a bone marrow test on him. As loss his blood counts are being maintained, I don't think that a bone marrow would really help [place] out.  He really enjoys having 5 weeks off between treatments.  We will see him back in 6 weeks.Volanda Napoleon, MD 5/29/20189:18 AM  I

## 2016-11-06 NOTE — Patient Instructions (Signed)
Implanted Port Home Guide An implanted port is a type of central line that is placed under the skin. Central lines are used to provide IV access when treatment or nutrition needs to be given through a person's veins. Implanted ports are used for long-term IV access. An implanted port may be placed because:  You need IV medicine that would be irritating to the small veins in your hands or arms.  You need long-term IV medicines, such as antibiotics.  You need IV nutrition for a long period.  You need frequent blood draws for lab tests.  You need dialysis.  Implanted ports are usually placed in the chest area, but they can also be placed in the upper arm, the abdomen, or the leg. An implanted port has two main parts:  Reservoir. The reservoir is round and will appear as a small, raised area under your skin. The reservoir is the part where a needle is inserted to give medicines or draw blood.  Catheter. The catheter is a thin, flexible tube that extends from the reservoir. The catheter is placed into a large vein. Medicine that is inserted into the reservoir goes into the catheter and then into the vein.  How will I care for my incision site? Do not get the incision site wet. Bathe or shower as directed by your health care provider. How is my port accessed? Special steps must be taken to access the port:  Before the port is accessed, a numbing cream can be placed on the skin. This helps numb the skin over the port site.  Your health care provider uses a sterile technique to access the port. ? Your health care provider must put on a mask and sterile gloves. ? The skin over your port is cleaned carefully with an antiseptic and allowed to dry. ? The port is gently pinched between sterile gloves, and a needle is inserted into the port.  Only "non-coring" port needles should be used to access the port. Once the port is accessed, a blood return should be checked. This helps ensure that the port  is in the vein and is not clogged.  If your port needs to remain accessed for a constant infusion, a clear (transparent) bandage will be placed over the needle site. The bandage and needle will need to be changed every week, or as directed by your health care provider.  Keep the bandage covering the needle clean and dry. Do not get it wet. Follow your health care provider's instructions on how to take a shower or bath while the port is accessed.  If your port does not need to stay accessed, no bandage is needed over the port.  What is flushing? Flushing helps keep the port from getting clogged. Follow your health care provider's instructions on how and when to flush the port. Ports are usually flushed with saline solution or a medicine called heparin. The need for flushing will depend on how the port is used.  If the port is used for intermittent medicines or blood draws, the port will need to be flushed: ? After medicines have been given. ? After blood has been drawn. ? As part of routine maintenance.  If a constant infusion is running, the port may not need to be flushed.  How long will my port stay implanted? The port can stay in for as long as your health care provider thinks it is needed. When it is time for the port to come out, surgery will be   done to remove it. The procedure is similar to the one performed when the port was put in. When should I seek immediate medical care? When you have an implanted port, you should seek immediate medical care if:  You notice a bad smell coming from the incision site.  You have swelling, redness, or drainage at the incision site.  You have more swelling or pain at the port site or the surrounding area.  You have a fever that is not controlled with medicine.  This information is not intended to replace advice given to you by your health care provider. Make sure you discuss any questions you have with your health care provider. Document  Released: 05/28/2005 Document Revised: 11/03/2015 Document Reviewed: 02/02/2013 Elsevier Interactive Patient Education  2017 [employer]  

## 2016-11-06 NOTE — Patient Instructions (Signed)
Decitabine injection for infusion What is this medicine? DECITABINE (dee SYE ta been) is a chemotherapy drug. This medicine reduces the growth of cancer cells. It is used to treat adults with myelodysplastic syndromes. This medicine may be used for other purposes; ask your health care provider or pharmacist if you have questions. COMMON BRAND NAME(S): Dacogen What should I tell my health care provider before I take this medicine? They need to know if you have any of these conditions: -infection (especially a virus infection such as chickenpox, cold sores, or herpes) -kidney disease -liver disease -an unusual or allergic reaction to decitabine, other medicines, foods, dyes, or preservatives -pregnant or trying to get pregnant -breast-feeding How should I use this medicine? This medicine is for infusion into a vein. It is administered in a hospital or clinic by a doctor or health care professional. Talk to your pediatrician regarding the use of this medicine in children. Special care may be needed. Overdosage: If you think you have taken too much of this medicine contact a poison control center or emergency room at once. NOTE: This medicine is only for you. Do not share this medicine with others. What if I miss a dose? It is important not to miss your dose. Call your doctor or health care professional if you are unable to keep an appointment. What may interact with this medicine? -vaccines Talk to your doctor or health care professional before taking any of these medicines: -aspirin -acetaminophen -ibuprofen -ketoprofen -naproxen This list may not describe all possible interactions. Give your health care provider a list of all the medicines, herbs, non-prescription drugs, or dietary supplements you use. Also tell them if you smoke, drink alcohol, or use illegal drugs. Some items may interact with your medicine. What should I watch for while using this medicine? Visit your doctor for  checks on your progress. This drug may make you feel generally unwell. This is not uncommon, as chemotherapy can affect healthy cells as well as cancer cells. Report any side effects. Continue your course of treatment even though you feel ill unless your doctor tells you to stop. In some cases, you may be given additional medicines to help with side effects. Follow all directions for their use. Call your doctor or health care professional for advice if you get a fever, chills or sore throat, or other symptoms of a cold or flu. Do not treat yourself. This drug decreases your body's ability to fight infections. Try to avoid being around people who are sick. This medicine may increase your risk to bruise or bleed. Call your doctor or health care professional if you notice any unusual bleeding. Do not become pregnant while taking this medicine or for at least 1 month after stopping it. Women should inform their doctor if they wish to become pregnant or think they might be pregnant. Men should not father a child while taking this medicine and for at least 2 months after stopping it. There is a potential for serious side effects to an unborn child. Talk to your health care professional or pharmacist for more information. Do not breast-feed an infant while taking this medicine. What side effects may I notice from receiving this medicine? Side effects that you should report to your doctor or health care professional as soon as possible: -low blood counts - this medicine may decrease the number of white blood cells, red blood cells and platelets. You may be at increased risk for infections and bleeding. -signs of infection - fever or   chills, cough, sore throat, pain or difficulty passing urine -signs of decreased platelets or bleeding - bruising, pinpoint red spots on the skin, black, tarry stools, blood in the urine -signs of decreased red blood cells - unusual weakness or tiredness, fainting spells,  lightheadedness -increased blood sugar Side effects that usually do not require medical attention (report to your doctor or health care professional if they continue or are bothersome): -constipation -diarrhea -headache -loss of appetite -nausea, vomiting -skin rash, itching -stomach pain -water retention -weak or tired This list may not describe all possible side effects. Call your doctor for medical advice about side effects. You may report side effects to FDA at 1-800-FDA-1088. Where should I keep my medicine? This drug is given in a hospital or clinic and will not be stored at home. NOTE: This sheet is a summary. It may not cover all possible information. If you have questions about this medicine, talk to your doctor, pharmacist, or health care provider.  2018 Elsevier/Gold Standard (2015-06-30 15:52:57)  

## 2016-11-07 ENCOUNTER — Ambulatory Visit (HOSPITAL_BASED_OUTPATIENT_CLINIC_OR_DEPARTMENT_OTHER): Payer: Medicare Other

## 2016-11-07 VITALS — BP 142/42 | HR 76 | Temp 97.7°F | Resp 20

## 2016-11-07 DIAGNOSIS — D4620 Refractory anemia with excess of blasts, unspecified: Secondary | ICD-10-CM

## 2016-11-07 DIAGNOSIS — Z5111 Encounter for antineoplastic chemotherapy: Secondary | ICD-10-CM

## 2016-11-07 DIAGNOSIS — D4621 Refractory anemia with excess of blasts 1: Secondary | ICD-10-CM | POA: Diagnosis not present

## 2016-11-07 LAB — RETICULOCYTES: Reticulocyte Count: 1.6 % (ref 0.6–2.6)

## 2016-11-07 MED ORDER — SODIUM CHLORIDE 0.9% FLUSH
10.0000 mL | INTRAVENOUS | Status: DC | PRN
  Administered 2016-11-07: 10 mL
  Filled 2016-11-07: qty 10

## 2016-11-07 MED ORDER — HEPARIN SOD (PORK) LOCK FLUSH 100 UNIT/ML IV SOLN
500.0000 [IU] | Freq: Once | INTRAVENOUS | Status: AC | PRN
  Administered 2016-11-07: 500 [IU]
  Filled 2016-11-07: qty 5

## 2016-11-07 MED ORDER — PROCHLORPERAZINE MALEATE 10 MG PO TABS
ORAL_TABLET | ORAL | Status: AC
  Filled 2016-11-07: qty 1

## 2016-11-07 MED ORDER — SODIUM CHLORIDE 0.9 % IV SOLN
Freq: Once | INTRAVENOUS | Status: AC
  Administered 2016-11-07: 11:00:00 via INTRAVENOUS

## 2016-11-07 MED ORDER — PROCHLORPERAZINE MALEATE 10 MG PO TABS
10.0000 mg | ORAL_TABLET | Freq: Once | ORAL | Status: AC
  Administered 2016-11-07: 10 mg via ORAL

## 2016-11-07 MED ORDER — SODIUM CHLORIDE 0.9 % IV SOLN
20.0000 mg/m2 | Freq: Once | INTRAVENOUS | Status: AC
  Administered 2016-11-07: 45 mg via INTRAVENOUS
  Filled 2016-11-07: qty 9

## 2016-11-07 NOTE — Patient Instructions (Signed)
Decitabine injection for infusion What is this medicine? DECITABINE (dee SYE ta been) is a chemotherapy drug. This medicine reduces the growth of cancer cells. It is used to treat adults with myelodysplastic syndromes. This medicine may be used for other purposes; ask your health care provider or pharmacist if you have questions. COMMON BRAND NAME(S): Dacogen What should I tell my health care provider before I take this medicine? They need to know if you have any of these conditions: -infection (especially a virus infection such as chickenpox, cold sores, or herpes) -kidney disease -liver disease -an unusual or allergic reaction to decitabine, other medicines, foods, dyes, or preservatives -pregnant or trying to get pregnant -breast-feeding How should I use this medicine? This medicine is for infusion into a vein. It is administered in a hospital or clinic by a doctor or health care professional. Talk to your pediatrician regarding the use of this medicine in children. Special care may be needed. Overdosage: If you think you have taken too much of this medicine contact a poison control center or emergency room at once. NOTE: This medicine is only for you. Do not share this medicine with others. What if I miss a dose? It is important not to miss your dose. Call your doctor or health care professional if you are unable to keep an appointment. What may interact with this medicine? -vaccines Talk to your doctor or health care professional before taking any of these medicines: -aspirin -acetaminophen -ibuprofen -ketoprofen -naproxen This list may not describe all possible interactions. Give your health care provider a list of all the medicines, herbs, non-prescription drugs, or dietary supplements you use. Also tell them if you smoke, drink alcohol, or use illegal drugs. Some items may interact with your medicine. What should I watch for while using this medicine? Visit your doctor for  checks on your progress. This drug may make you feel generally unwell. This is not uncommon, as chemotherapy can affect healthy cells as well as cancer cells. Report any side effects. Continue your course of treatment even though you feel ill unless your doctor tells you to stop. In some cases, you may be given additional medicines to help with side effects. Follow all directions for their use. Call your doctor or health care professional for advice if you get a fever, chills or sore throat, or other symptoms of a cold or flu. Do not treat yourself. This drug decreases your body's ability to fight infections. Try to avoid being around people who are sick. This medicine may increase your risk to bruise or bleed. Call your doctor or health care professional if you notice any unusual bleeding. Do not become pregnant while taking this medicine or for at least 1 month after stopping it. Women should inform their doctor if they wish to become pregnant or think they might be pregnant. Men should not father a child while taking this medicine and for at least 2 months after stopping it. There is a potential for serious side effects to an unborn child. Talk to your health care professional or pharmacist for more information. Do not breast-feed an infant while taking this medicine. What side effects may I notice from receiving this medicine? Side effects that you should report to your doctor or health care professional as soon as possible: -low blood counts - this medicine may decrease the number of white blood cells, red blood cells and platelets. You may be at increased risk for infections and bleeding. -signs of infection - fever or   chills, cough, sore throat, pain or difficulty passing urine -signs of decreased platelets or bleeding - bruising, pinpoint red spots on the skin, black, tarry stools, blood in the urine -signs of decreased red blood cells - unusual weakness or tiredness, fainting spells,  lightheadedness -increased blood sugar Side effects that usually do not require medical attention (report to your doctor or health care professional if they continue or are bothersome): -constipation -diarrhea -headache -loss of appetite -nausea, vomiting -skin rash, itching -stomach pain -water retention -weak or tired This list may not describe all possible side effects. Call your doctor for medical advice about side effects. You may report side effects to FDA at 1-800-FDA-1088. Where should I keep my medicine? This drug is given in a hospital or clinic and will not be stored at home. NOTE: This sheet is a summary. It may not cover all possible information. If you have questions about this medicine, talk to your doctor, pharmacist, or health care provider.  2018 Elsevier/Gold Standard (2015-06-30 15:52:57)  

## 2016-11-08 ENCOUNTER — Ambulatory Visit (HOSPITAL_BASED_OUTPATIENT_CLINIC_OR_DEPARTMENT_OTHER): Payer: Medicare Other

## 2016-11-08 DIAGNOSIS — D4621 Refractory anemia with excess of blasts 1: Secondary | ICD-10-CM | POA: Diagnosis not present

## 2016-11-08 DIAGNOSIS — Z5111 Encounter for antineoplastic chemotherapy: Secondary | ICD-10-CM | POA: Diagnosis not present

## 2016-11-08 DIAGNOSIS — D4620 Refractory anemia with excess of blasts, unspecified: Secondary | ICD-10-CM

## 2016-11-08 MED ORDER — PROCHLORPERAZINE MALEATE 10 MG PO TABS
ORAL_TABLET | ORAL | Status: AC
  Filled 2016-11-08: qty 1

## 2016-11-08 MED ORDER — SODIUM CHLORIDE 0.9% FLUSH
10.0000 mL | INTRAVENOUS | Status: DC | PRN
  Administered 2016-11-08: 10 mL
  Filled 2016-11-08: qty 10

## 2016-11-08 MED ORDER — SODIUM CHLORIDE 0.9 % IV SOLN
20.0000 mg/m2 | Freq: Once | INTRAVENOUS | Status: AC
  Administered 2016-11-08: 45 mg via INTRAVENOUS
  Filled 2016-11-08: qty 9

## 2016-11-08 MED ORDER — SODIUM CHLORIDE 0.9 % IV SOLN
Freq: Once | INTRAVENOUS | Status: AC
  Administered 2016-11-08: 11:00:00 via INTRAVENOUS

## 2016-11-08 MED ORDER — HEPARIN SOD (PORK) LOCK FLUSH 100 UNIT/ML IV SOLN
500.0000 [IU] | Freq: Once | INTRAVENOUS | Status: AC | PRN
  Administered 2016-11-08: 500 [IU]
  Filled 2016-11-08: qty 5

## 2016-11-08 MED ORDER — PROCHLORPERAZINE MALEATE 10 MG PO TABS
10.0000 mg | ORAL_TABLET | Freq: Once | ORAL | Status: AC
  Administered 2016-11-08: 10 mg via ORAL

## 2016-11-08 NOTE — Patient Instructions (Signed)
Decitabine injection for infusion What is this medicine? DECITABINE (dee SYE ta been) is a chemotherapy drug. This medicine reduces the growth of cancer cells. It is used to treat adults with myelodysplastic syndromes. This medicine may be used for other purposes; ask your health care provider or pharmacist if you have questions. COMMON BRAND NAME(S): Dacogen What should I tell my health care provider before I take this medicine? They need to know if you have any of these conditions: -infection (especially a virus infection such as chickenpox, cold sores, or herpes) -kidney disease -liver disease -an unusual or allergic reaction to decitabine, other medicines, foods, dyes, or preservatives -pregnant or trying to get pregnant -breast-feeding How should I use this medicine? This medicine is for infusion into a vein. It is administered in a hospital or clinic by a doctor or health care professional. Talk to your pediatrician regarding the use of this medicine in children. Special care may be needed. Overdosage: If you think you have taken too much of this medicine contact a poison control center or emergency room at once. NOTE: This medicine is only for you. Do not share this medicine with others. What if I miss a dose? It is important not to miss your dose. Call your doctor or health care professional if you are unable to keep an appointment. What may interact with this medicine? -vaccines Talk to your doctor or health care professional before taking any of these medicines: -aspirin -acetaminophen -ibuprofen -ketoprofen -naproxen This list may not describe all possible interactions. Give your health care provider a list of all the medicines, herbs, non-prescription drugs, or dietary supplements you use. Also tell them if you smoke, drink alcohol, or use illegal drugs. Some items may interact with your medicine. What should I watch for while using this medicine? Visit your doctor for  checks on your progress. This drug may make you feel generally unwell. This is not uncommon, as chemotherapy can affect healthy cells as well as cancer cells. Report any side effects. Continue your course of treatment even though you feel ill unless your doctor tells you to stop. In some cases, you may be given additional medicines to help with side effects. Follow all directions for their use. Call your doctor or health care professional for advice if you get a fever, chills or sore throat, or other symptoms of a cold or flu. Do not treat yourself. This drug decreases your body's ability to fight infections. Try to avoid being around people who are sick. This medicine may increase your risk to bruise or bleed. Call your doctor or health care professional if you notice any unusual bleeding. Do not become pregnant while taking this medicine or for at least 1 month after stopping it. Women should inform their doctor if they wish to become pregnant or think they might be pregnant. Men should not father a child while taking this medicine and for at least 2 months after stopping it. There is a potential for serious side effects to an unborn child. Talk to your health care professional or pharmacist for more information. Do not breast-feed an infant while taking this medicine. What side effects may I notice from receiving this medicine? Side effects that you should report to your doctor or health care professional as soon as possible: -low blood counts - this medicine may decrease the number of white blood cells, red blood cells and platelets. You may be at increased risk for infections and bleeding. -signs of infection - fever or   chills, cough, sore throat, pain or difficulty passing urine -signs of decreased platelets or bleeding - bruising, pinpoint red spots on the skin, black, tarry stools, blood in the urine -signs of decreased red blood cells - unusual weakness or tiredness, fainting spells,  lightheadedness -increased blood sugar Side effects that usually do not require medical attention (report to your doctor or health care professional if they continue or are bothersome): -constipation -diarrhea -headache -loss of appetite -nausea, vomiting -skin rash, itching -stomach pain -water retention -weak or tired This list may not describe all possible side effects. Call your doctor for medical advice about side effects. You may report side effects to FDA at 1-800-FDA-1088. Where should I keep my medicine? This drug is given in a hospital or clinic and will not be stored at home. NOTE: This sheet is a summary. It may not cover all possible information. If you have questions about this medicine, talk to your doctor, pharmacist, or health care provider.  2018 Elsevier/Gold Standard (2015-06-30 15:52:57)  

## 2016-11-09 ENCOUNTER — Ambulatory Visit (HOSPITAL_BASED_OUTPATIENT_CLINIC_OR_DEPARTMENT_OTHER): Payer: Medicare Other

## 2016-11-09 VITALS — BP 120/58 | HR 68 | Temp 95.0°F | Resp 18

## 2016-11-09 DIAGNOSIS — D4621 Refractory anemia with excess of blasts 1: Secondary | ICD-10-CM | POA: Diagnosis not present

## 2016-11-09 DIAGNOSIS — Z5111 Encounter for antineoplastic chemotherapy: Secondary | ICD-10-CM | POA: Diagnosis not present

## 2016-11-09 DIAGNOSIS — D4620 Refractory anemia with excess of blasts, unspecified: Secondary | ICD-10-CM

## 2016-11-09 MED ORDER — SODIUM CHLORIDE 0.9% FLUSH
10.0000 mL | INTRAVENOUS | Status: DC | PRN
  Administered 2016-11-09: 10 mL
  Filled 2016-11-09: qty 10

## 2016-11-09 MED ORDER — PROCHLORPERAZINE MALEATE 10 MG PO TABS
10.0000 mg | ORAL_TABLET | Freq: Once | ORAL | Status: AC
Start: 2016-11-09 — End: 2016-11-09
  Administered 2016-11-09: 10 mg via ORAL

## 2016-11-09 MED ORDER — SODIUM CHLORIDE 0.9 % IV SOLN
Freq: Once | INTRAVENOUS | Status: AC
  Administered 2016-11-09: 10:00:00 via INTRAVENOUS

## 2016-11-09 MED ORDER — SODIUM CHLORIDE 0.9 % IV SOLN
20.0000 mg/m2 | Freq: Once | INTRAVENOUS | Status: AC
  Administered 2016-11-09: 45 mg via INTRAVENOUS
  Filled 2016-11-09: qty 9

## 2016-11-09 MED ORDER — HEPARIN SOD (PORK) LOCK FLUSH 100 UNIT/ML IV SOLN
500.0000 [IU] | Freq: Once | INTRAVENOUS | Status: AC | PRN
  Administered 2016-11-09: 500 [IU]
  Filled 2016-11-09: qty 5

## 2016-11-09 MED ORDER — PROCHLORPERAZINE MALEATE 10 MG PO TABS
ORAL_TABLET | ORAL | Status: AC
  Filled 2016-11-09: qty 1

## 2016-11-09 NOTE — Patient Instructions (Signed)
Decitabine injection for infusion What is this medicine? DECITABINE (dee SYE ta been) is a chemotherapy drug. This medicine reduces the growth of cancer cells. It is used to treat adults with myelodysplastic syndromes. This medicine may be used for other purposes; ask your health care provider or pharmacist if you have questions. COMMON BRAND NAME(S): Dacogen What should I tell my health care provider before I take this medicine? They need to know if you have any of these conditions: -infection (especially a virus infection such as chickenpox, cold sores, or herpes) -kidney disease -liver disease -an unusual or allergic reaction to decitabine, other medicines, foods, dyes, or preservatives -pregnant or trying to get pregnant -breast-feeding How should I use this medicine? This medicine is for infusion into a vein. It is administered in a hospital or clinic by a doctor or health care professional. Talk to your pediatrician regarding the use of this medicine in children. Special care may be needed. Overdosage: If you think you have taken too much of this medicine contact a poison control center or emergency room at once. NOTE: This medicine is only for you. Do not share this medicine with others. What if I miss a dose? It is important not to miss your dose. Call your doctor or health care professional if you are unable to keep an appointment. What may interact with this medicine? -vaccines Talk to your doctor or health care professional before taking any of these medicines: -aspirin -acetaminophen -ibuprofen -ketoprofen -naproxen This list may not describe all possible interactions. Give your health care provider a list of all the medicines, herbs, non-prescription drugs, or dietary supplements you use. Also tell them if you smoke, drink alcohol, or use illegal drugs. Some items may interact with your medicine. What should I watch for while using this medicine? Visit your doctor for  checks on your progress. This drug may make you feel generally unwell. This is not uncommon, as chemotherapy can affect healthy cells as well as cancer cells. Report any side effects. Continue your course of treatment even though you feel ill unless your doctor tells you to stop. In some cases, you may be given additional medicines to help with side effects. Follow all directions for their use. Call your doctor or health care professional for advice if you get a fever, chills or sore throat, or other symptoms of a cold or flu. Do not treat yourself. This drug decreases your body's ability to fight infections. Try to avoid being around people who are sick. This medicine may increase your risk to bruise or bleed. Call your doctor or health care professional if you notice any unusual bleeding. Do not become pregnant while taking this medicine or for at least 1 month after stopping it. Women should inform their doctor if they wish to become pregnant or think they might be pregnant. Men should not father a child while taking this medicine and for at least 2 months after stopping it. There is a potential for serious side effects to an unborn child. Talk to your health care professional or pharmacist for more information. Do not breast-feed an infant while taking this medicine. What side effects may I notice from receiving this medicine? Side effects that you should report to your doctor or health care professional as soon as possible: -low blood counts - this medicine may decrease the number of white blood cells, red blood cells and platelets. You may be at increased risk for infections and bleeding. -signs of infection - fever or   chills, cough, sore throat, pain or difficulty passing urine -signs of decreased platelets or bleeding - bruising, pinpoint red spots on the skin, black, tarry stools, blood in the urine -signs of decreased red blood cells - unusual weakness or tiredness, fainting spells,  lightheadedness -increased blood sugar Side effects that usually do not require medical attention (report to your doctor or health care professional if they continue or are bothersome): -constipation -diarrhea -headache -loss of appetite -nausea, vomiting -skin rash, itching -stomach pain -water retention -weak or tired This list may not describe all possible side effects. Call your doctor for medical advice about side effects. You may report side effects to FDA at 1-800-FDA-1088. Where should I keep my medicine? This drug is given in a hospital or clinic and will not be stored at home. NOTE: This sheet is a summary. It may not cover all possible information. If you have questions about this medicine, talk to your doctor, pharmacist, or health care provider.  2018 Elsevier/Gold Standard (2015-06-30 15:52:57)  

## 2016-11-12 ENCOUNTER — Ambulatory Visit: Payer: Medicare Other

## 2016-12-03 ENCOUNTER — Other Ambulatory Visit: Payer: Medicare Other

## 2016-12-03 ENCOUNTER — Ambulatory Visit: Payer: Medicare Other | Admitting: Hematology & Oncology

## 2016-12-03 ENCOUNTER — Ambulatory Visit: Payer: Medicare Other

## 2016-12-04 ENCOUNTER — Ambulatory Visit: Payer: Medicare Other

## 2016-12-05 ENCOUNTER — Ambulatory Visit: Payer: Medicare Other

## 2016-12-06 ENCOUNTER — Ambulatory Visit: Payer: Medicare Other

## 2016-12-07 ENCOUNTER — Ambulatory Visit: Payer: Medicare Other

## 2016-12-10 ENCOUNTER — Other Ambulatory Visit: Payer: Self-pay | Admitting: *Deleted

## 2016-12-10 DIAGNOSIS — F064 Anxiety disorder due to known physiological condition: Secondary | ICD-10-CM

## 2016-12-10 DIAGNOSIS — D4620 Refractory anemia with excess of blasts, unspecified: Secondary | ICD-10-CM

## 2016-12-10 MED ORDER — PREDNISONE 20 MG PO TABS: 20.0000 mg | ORAL_TABLET | Freq: Every day | ORAL | 6 refills | Status: DC

## 2016-12-11 ENCOUNTER — Ambulatory Visit: Payer: Medicare Other | Admitting: Hematology & Oncology

## 2016-12-11 ENCOUNTER — Other Ambulatory Visit: Payer: Medicare Other

## 2016-12-17 ENCOUNTER — Other Ambulatory Visit (HOSPITAL_BASED_OUTPATIENT_CLINIC_OR_DEPARTMENT_OTHER): Payer: Medicare Other

## 2016-12-17 ENCOUNTER — Ambulatory Visit (HOSPITAL_BASED_OUTPATIENT_CLINIC_OR_DEPARTMENT_OTHER): Payer: Medicare Other

## 2016-12-17 ENCOUNTER — Ambulatory Visit (HOSPITAL_BASED_OUTPATIENT_CLINIC_OR_DEPARTMENT_OTHER): Payer: Medicare Other | Admitting: Hematology & Oncology

## 2016-12-17 VITALS — BP 123/67 | HR 86 | Temp 97.7°F | Resp 16 | Wt 215.0 lb

## 2016-12-17 DIAGNOSIS — D4621 Refractory anemia with excess of blasts 1: Secondary | ICD-10-CM

## 2016-12-17 DIAGNOSIS — D4620 Refractory anemia with excess of blasts, unspecified: Secondary | ICD-10-CM

## 2016-12-17 DIAGNOSIS — D508 Other iron deficiency anemias: Secondary | ICD-10-CM

## 2016-12-17 LAB — CBC WITH DIFFERENTIAL (CANCER CENTER ONLY)
BASO#: 0 10*3/uL (ref 0.0–0.2)
BASO%: 0.5 % (ref 0.0–2.0)
EOS%: 1 % (ref 0.0–7.0)
Eosinophils Absolute: 0.1 10*3/uL (ref 0.0–0.5)
HCT: 37.8 % — ABNORMAL LOW (ref 38.7–49.9)
HGB: 12.7 g/dL — ABNORMAL LOW (ref 13.0–17.1)
LYMPH#: 1 10*3/uL (ref 0.9–3.3)
LYMPH%: 12.7 % — ABNORMAL LOW (ref 14.0–48.0)
MCH: 34 pg — ABNORMAL HIGH (ref 28.0–33.4)
MCHC: 33.6 g/dL (ref 32.0–35.9)
MCV: 101 fL — AB (ref 82–98)
MONO#: 1 10*3/uL — ABNORMAL HIGH (ref 0.1–0.9)
MONO%: 12 % (ref 0.0–13.0)
NEUT#: 5.9 10*3/uL (ref 1.5–6.5)
NEUT%: 73.8 % (ref 40.0–80.0)
PLATELETS: 241 10*3/uL (ref 145–400)
RBC: 3.73 10*6/uL — ABNORMAL LOW (ref 4.20–5.70)
RDW: 16.4 % — AB (ref 11.1–15.7)
WBC: 7.9 10*3/uL (ref 4.0–10.0)

## 2016-12-17 LAB — IRON AND TIBC
%SAT: 35 % (ref 20–55)
Iron: 104 ug/dL (ref 42–163)
TIBC: 295 ug/dL (ref 202–409)
UIBC: 191 ug/dL (ref 117–376)

## 2016-12-17 LAB — CMP (CANCER CENTER ONLY)
ALT: 25 U/L (ref 10–47)
AST: 22 U/L (ref 11–38)
Albumin: 3.4 g/dL (ref 3.3–5.5)
Alkaline Phosphatase: 41 U/L (ref 26–84)
BILIRUBIN TOTAL: 0.6 mg/dL (ref 0.20–1.60)
BUN, Bld: 22 mg/dL (ref 7–22)
CALCIUM: 9.1 mg/dL (ref 8.0–10.3)
CO2: 29 meq/L (ref 18–33)
Chloride: 103 mEq/L (ref 98–108)
Creat: 1.7 mg/dl — ABNORMAL HIGH (ref 0.6–1.2)
Glucose, Bld: 112 mg/dL (ref 73–118)
POTASSIUM: 3.9 meq/L (ref 3.3–4.7)
Sodium: 137 mEq/L (ref 128–145)
Total Protein: 6.8 g/dL (ref 6.4–8.1)

## 2016-12-17 LAB — TECHNOLOGIST REVIEW CHCC SATELLITE

## 2016-12-17 LAB — FERRITIN

## 2016-12-17 MED ORDER — PROCHLORPERAZINE MALEATE 10 MG PO TABS
10.0000 mg | ORAL_TABLET | Freq: Once | ORAL | Status: AC
Start: 2016-12-17 — End: 2016-12-17
  Administered 2016-12-17: 10 mg via ORAL

## 2016-12-17 MED ORDER — HEPARIN SOD (PORK) LOCK FLUSH 100 UNIT/ML IV SOLN
500.0000 [IU] | Freq: Once | INTRAVENOUS | Status: AC | PRN
  Administered 2016-12-17: 500 [IU]
  Filled 2016-12-17: qty 5

## 2016-12-17 MED ORDER — SODIUM CHLORIDE 0.9 % IV SOLN
Freq: Once | INTRAVENOUS | Status: AC
  Administered 2016-12-17: 12:00:00 via INTRAVENOUS

## 2016-12-17 MED ORDER — PROCHLORPERAZINE MALEATE 10 MG PO TABS
ORAL_TABLET | ORAL | Status: AC
  Filled 2016-12-17: qty 1

## 2016-12-17 MED ORDER — SODIUM CHLORIDE 0.9% FLUSH
10.0000 mL | INTRAVENOUS | Status: DC | PRN
  Administered 2016-12-17: 10 mL
  Filled 2016-12-17: qty 10

## 2016-12-17 MED ORDER — SODIUM CHLORIDE 0.9 % IV SOLN
20.0000 mg/m2 | Freq: Once | INTRAVENOUS | Status: AC
  Administered 2016-12-17: 45 mg via INTRAVENOUS
  Filled 2016-12-17: qty 9

## 2016-12-17 NOTE — Progress Notes (Signed)
Hematology and Oncology Follow Up Visit  Anthony [name] [id number] 01/31/1941 76 y.o. 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 He is doing quite well. He really is not using his cane anymore. He has much less dizziness. He is much more stable. He is much more active. He does things outside the house now.  He has done incredibly well with treatment. He has not been transfused with blood since August of last year.  His last iron studies back in May showed a ferritin of 1395. His iron saturation was only 34%.  His appetite is good. He had a good Memorial Day weekend. He had no issues with nausea or vomiting. He's had no cough. He's had no fever.  Overall, his performance status is ECOG 1.    Medications:  Allergies as of 12/17/2016   No Known Allergies     Medication List       Accurate as of 12/17/16 11:17 AM. Always use your most recent med list.          bimatoprost 0.01 % Soln Commonly known as:  LUMIGAN Place 1 drop into both eyes at bedtime.   budesonide-formoterol 160-4.5 MCG/ACT inhaler Commonly known as:  SYMBICORT Inhale 2 puffs into the lungs 2 (two) times daily.   buPROPion 150 MG 24 hr tablet Commonly known as:  WELLBUTRIN XL Take 150 mg by mouth daily.   clopidogrel 75 MG tablet Commonly known as:  PLAVIX Take 75 mg by mouth daily.   co-enzyme Q-10 30 MG capsule Take 100 mg by mouth daily.   Cyanocobalamin 2500 MCG Tabs Take 5,000 mcg by mouth daily.   fish oil-omega-3 fatty acids 1000 MG capsule Take 1 g by mouth daily.   glucosamine-chondroitin 500-400 MG tablet Take 1 tablet by mouth daily.     LORazepam 0.5 MG tablet Commonly known as:  ATIVAN Take 1 tablet (0.5 mg total) by mouth every 6 (six) hours as needed (Nausea or vomiting).   magnesium oxide 400 MG tablet Commonly known as:  MAG-OX Take 400 mg by mouth daily.   orphenadrine 100 MG tablet Commonly known as:  NORFLEX Take 1 tablet (100 mg total) by mouth 2 (two) times daily as needed for muscle spasms.   pantoprazole 40 MG tablet Commonly known as:  PROTONIX Take 40 mg by mouth daily.   predniSONE 20 MG tablet Commonly known as:  DELTASONE Take 1 tablet (20 mg total) by mouth daily with breakfast.   PROAIR RESPICLICK 222 (90 Base) MCG/ACT Aepb Generic drug:  Albuterol Sulfate Inhale 2 puffs into the lungs every 6 (six) hours as needed.   prochlorperazine 10 MG tablet Commonly known as:  COMPAZINE Take 1 tablet (10 mg total) by mouth every 6 (six) hours as needed (Nausea or vomiting).   ranitidine 300 MG tablet Commonly known as:  ZANTAC Take 300 mg by mouth at bedtime.   sertraline 100 MG tablet Commonly known as:  ZOLOFT Take 100 mg by mouth daily.   SIMBRINZA 1-0.2 % Susp Generic drug:  Brinzolamide-Brimonidine   simvastatin 40 MG tablet Commonly known as:  ZOCOR Take  40 mg by mouth every other day.   tiotropium 18 MCG inhalation capsule Commonly known as:  SPIRIVA Place 18 mcg into inhaler and inhale daily.   triamcinolone cream 0.1 % Commonly known as:  KENALOG APPLY TO RASH/ITCH ON ARMS TWICE DAILY AS NEEDED   Vitamin D3 5000 units Tabs Take by mouth daily.       Allergies: No Known Allergies  Past Medical History, Surgical history, Social history, and Family History were reviewed and updated.  Review of Systems: All other 10 point review of systems is negative.   Physical Exam:  weight is 215 lb (97.5 kg). His oral temperature is 97.7 F (36.5 C). His blood pressure is 123/67 and his pulse is 86. His respiration is 16 and oxygen saturation is 97%.   Wt Readings from Last 3  Encounters:  12/17/16 215 lb (97.5 kg)  11/06/16 214 lb 12.8 oz (97.4 kg)  09/24/16 211 lb 8 oz (95.9 kg)    Well-developed and well-nourished white male in no obvious distress. Head and neck exam shows no ocular or oral lesions. He has no palpable cervical or supraclavicular lymph nodes. Lungs are clear. Cardiac exam regular rate and rhythm with no murmurs, rubs or bruits. Abdomen is soft. He has good bowel sounds. There is no fluid wave. There is no palpable liver or spleen tip. Back exam shows no tenderness over the spine, ribs or hips. Extremities shows no clubbing, cyanosis or edema. Skin exam shows no rashes, ecchymoses or petechia. Neurological exam shows no focal neurological deficits. oriented, appropriate affect  Lab Results  Component Value Date   WBC 7.9 12/17/2016   HGB 12.7 (L) 12/17/2016   HCT 37.8 (L) 12/17/2016   MCV 101 (H) 12/17/2016   PLT 241 12/17/2016   Lab Results  Component Value Date   FERRITIN 1,395 (H) 11/06/2016   IRON 95 11/06/2016   TIBC 278 11/06/2016   UIBC 182 11/06/2016   IRONPCTSAT 34 11/06/2016   Lab Results  Component Value Date   RETICCTPCT 1.7 05/30/2015   RBC 3.73 (L) 12/17/2016   RETICCTABS 39.4 05/30/2015   No results found for: KPAFRELGTCHN, LAMBDASER, KAPLAMBRATIO No results found for: Kandis Cocking [facility] Lab Results  Component Value Date   TOTALPROTELP 7.3 01/01/2014   ALBUMINELP 55.1 (L) 01/01/2014   A1GS 6.9 (H) 01/01/2014   A2GS 8.9 01/01/2014   BETS 7.9 (H) 01/01/2014   BETA2SER 6.7 (H) 01/01/2014   GAMS 14.5 01/01/2014   MSPIKE NOT DET 01/01/2014   SPEI * 01/01/2014     Chemistry      Component Value Date/Time   NA 137 12/17/2016 0954   NA 140 04/16/2016 0930   K 3.9 12/17/2016 0954   K 3.9 04/16/2016 0930   CL 103 12/17/2016 0954   CO2 29 12/17/2016 0954   CO2 24 04/16/2016 0930   BUN 22 12/17/2016 0954   BUN 20.5 04/16/2016 0930   CREATININE 1.7 (H) 12/17/2016 0954   CREATININE 1.2 04/16/2016 0930        Component Value Date/Time   CALCIUM 9.1 12/17/2016 0954   CALCIUM 8.9 04/16/2016 0930   ALKPHOS 41 12/17/2016 0954   ALKPHOS 54 04/16/2016 0930   AST 22 12/17/2016 0954   AST 16 04/16/2016 0930   ALT 25 12/17/2016 0954   ALT 21 04/16/2016 0930   BILITOT 0.60 12/17/2016 0954   BILITOT 0.27 04/16/2016 0930     Impression and Plan: Anthony [name] is 76 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  He is still responding quite nicely. I'm just glad that he is doing so well. Again, he is responded very nicely to therapy.  I do not see need for a bone marrow test on him. As loss his blood counts are being maintained, I don't think that a bone marrow would really help [place] out.  He really enjoys having 5 weeks off between treatments.  We will see him back in 6 weeks.Volanda Napoleon, MD 7/9/201811:17 AM  I. Her it was recently feel the lack of respiratory action allergic reaction

## 2016-12-17 NOTE — Patient Instructions (Signed)
Decitabine injection for infusion What is this medicine? DECITABINE (dee SYE ta been) is a chemotherapy drug. This medicine reduces the growth of cancer cells. It is used to treat adults with myelodysplastic syndromes. This medicine may be used for other purposes; ask your health care provider or pharmacist if you have questions. COMMON BRAND NAME(S): Dacogen What should I tell my health care provider before I take this medicine? They need to know if you have any of these conditions: -infection (especially a virus infection such as chickenpox, cold sores, or herpes) -kidney disease -liver disease -an unusual or allergic reaction to decitabine, other medicines, foods, dyes, or preservatives -pregnant or trying to get pregnant -breast-feeding How should I use this medicine? This medicine is for infusion into a vein. It is administered in a hospital or clinic by a doctor or health care professional. Talk to your pediatrician regarding the use of this medicine in children. Special care may be needed. Overdosage: If you think you have taken too much of this medicine contact a poison control center or emergency room at once. NOTE: This medicine is only for you. Do not share this medicine with others. What if I miss a dose? It is important not to miss your dose. Call your doctor or health care professional if you are unable to keep an appointment. What may interact with this medicine? -vaccines Talk to your doctor or health care professional before taking any of these medicines: -aspirin -acetaminophen -ibuprofen -ketoprofen -naproxen This list may not describe all possible interactions. Give your health care provider a list of all the medicines, herbs, non-prescription drugs, or dietary supplements you use. Also tell them if you smoke, drink alcohol, or use illegal drugs. Some items may interact with your medicine. What should I watch for while using this medicine? Visit your doctor for  checks on your progress. This drug may make you feel generally unwell. This is not uncommon, as chemotherapy can affect healthy cells as well as cancer cells. Report any side effects. Continue your course of treatment even though you feel ill unless your doctor tells you to stop. In some cases, you may be given additional medicines to help with side effects. Follow all directions for their use. Call your doctor or health care professional for advice if you get a fever, chills or sore throat, or other symptoms of a cold or flu. Do not treat yourself. This drug decreases your body's ability to fight infections. Try to avoid being around people who are sick. This medicine may increase your risk to bruise or bleed. Call your doctor or health care professional if you notice any unusual bleeding. Do not become pregnant while taking this medicine or for at least 1 month after stopping it. Women should inform their doctor if they wish to become pregnant or think they might be pregnant. Men should not father a child while taking this medicine and for at least 2 months after stopping it. There is a potential for serious side effects to an unborn child. Talk to your health care professional or pharmacist for more information. Do not breast-feed an infant while taking this medicine. What side effects may I notice from receiving this medicine? Side effects that you should report to your doctor or health care professional as soon as possible: -low blood counts - this medicine may decrease the number of white blood cells, red blood cells and platelets. You may be at increased risk for infections and bleeding. -signs of infection - fever or   chills, cough, sore throat, pain or difficulty passing urine -signs of decreased platelets or bleeding - bruising, pinpoint red spots on the skin, black, tarry stools, blood in the urine -signs of decreased red blood cells - unusual weakness or tiredness, fainting spells,  lightheadedness -increased blood sugar Side effects that usually do not require medical attention (report to your doctor or health care professional if they continue or are bothersome): -constipation -diarrhea -headache -loss of appetite -nausea, vomiting -skin rash, itching -stomach pain -water retention -weak or tired This list may not describe all possible side effects. Call your doctor for medical advice about side effects. You may report side effects to FDA at 1-800-FDA-1088. Where should I keep my medicine? This drug is given in a hospital or clinic and will not be stored at home. NOTE: This sheet is a summary. It may not cover all possible information. If you have questions about this medicine, talk to your doctor, pharmacist, or health care provider.  2018 Elsevier/Gold Standard (2015-06-30 15:52:57)  

## 2016-12-18 ENCOUNTER — Ambulatory Visit (HOSPITAL_BASED_OUTPATIENT_CLINIC_OR_DEPARTMENT_OTHER): Payer: Medicare Other

## 2016-12-18 VITALS — BP 125/62 | HR 77 | Temp 98.1°F | Resp 16

## 2016-12-18 DIAGNOSIS — D4621 Refractory anemia with excess of blasts 1: Secondary | ICD-10-CM | POA: Diagnosis not present

## 2016-12-18 DIAGNOSIS — D4620 Refractory anemia with excess of blasts, unspecified: Secondary | ICD-10-CM

## 2016-12-18 LAB — KAPPA/LAMBDA LIGHT CHAINS
IG KAPPA FREE LIGHT CHAIN: 19.9 mg/L — AB (ref 3.3–19.4)
IG LAMBDA FREE LIGHT CHAIN: 17.9 mg/L (ref 5.7–26.3)
Kappa/Lambda FluidC Ratio: 1.11 (ref 0.26–1.65)

## 2016-12-18 LAB — IGG, IGA, IGM
IGG (IMMUNOGLOBIN G), SERUM: 735 mg/dL (ref 700–1600)
IGM (IMMUNOGLOBIN M), SRM: 79 mg/dL (ref 15–143)
IgA, Qn, Serum: 333 mg/dL (ref 61–437)

## 2016-12-18 MED ORDER — SODIUM CHLORIDE 0.9 % IV SOLN
20.0000 mg/m2 | Freq: Once | INTRAVENOUS | Status: AC
  Administered 2016-12-18: 45 mg via INTRAVENOUS
  Filled 2016-12-18: qty 9

## 2016-12-18 MED ORDER — PROCHLORPERAZINE MALEATE 10 MG PO TABS
10.0000 mg | ORAL_TABLET | Freq: Once | ORAL | Status: AC
  Administered 2016-12-18: 10 mg via ORAL

## 2016-12-18 MED ORDER — PROCHLORPERAZINE MALEATE 10 MG PO TABS
ORAL_TABLET | ORAL | Status: AC
  Filled 2016-12-18: qty 1

## 2016-12-18 MED ORDER — SODIUM CHLORIDE 0.9% FLUSH
10.0000 mL | INTRAVENOUS | Status: DC | PRN
  Administered 2016-12-18: 10 mL
  Filled 2016-12-18: qty 10

## 2016-12-18 MED ORDER — HEPARIN SOD (PORK) LOCK FLUSH 100 UNIT/ML IV SOLN
500.0000 [IU] | Freq: Once | INTRAVENOUS | Status: AC | PRN
  Administered 2016-12-18: 500 [IU]
  Filled 2016-12-18: qty 5

## 2016-12-18 MED ORDER — SODIUM CHLORIDE 0.9 % IV SOLN
Freq: Once | INTRAVENOUS | Status: AC
Start: 2016-12-18 — End: 2016-12-18
  Administered 2016-12-18: 11:00:00 via INTRAVENOUS

## 2016-12-18 NOTE — Patient Instructions (Signed)
Decitabine injection for infusion What is this medicine? DECITABINE (dee SYE ta been) is a chemotherapy drug. This medicine reduces the growth of cancer cells. It is used to treat adults with myelodysplastic syndromes. This medicine may be used for other purposes; ask your health care provider or pharmacist if you have questions. COMMON BRAND NAME(S): Dacogen What should I tell my health care provider before I take this medicine? They need to know if you have any of these conditions: -infection (especially a virus infection such as chickenpox, cold sores, or herpes) -kidney disease -liver disease -an unusual or allergic reaction to decitabine, other medicines, foods, dyes, or preservatives -pregnant or trying to get pregnant -breast-feeding How should I use this medicine? This medicine is for infusion into a vein. It is administered in a hospital or clinic by a doctor or health care professional. Talk to your pediatrician regarding the use of this medicine in children. Special care may be needed. Overdosage: If you think you have taken too much of this medicine contact a poison control center or emergency room at once. NOTE: This medicine is only for you. Do not share this medicine with others. What if I miss a dose? It is important not to miss your dose. Call your doctor or health care professional if you are unable to keep an appointment. What may interact with this medicine? -vaccines Talk to your doctor or health care professional before taking any of these medicines: -aspirin -acetaminophen -ibuprofen -ketoprofen -naproxen This list may not describe all possible interactions. Give your health care provider a list of all the medicines, herbs, non-prescription drugs, or dietary supplements you use. Also tell them if you smoke, drink alcohol, or use illegal drugs. Some items may interact with your medicine. What should I watch for while using this medicine? Visit your doctor for  checks on your progress. This drug may make you feel generally unwell. This is not uncommon, as chemotherapy can affect healthy cells as well as cancer cells. Report any side effects. Continue your course of treatment even though you feel ill unless your doctor tells you to stop. In some cases, you may be given additional medicines to help with side effects. Follow all directions for their use. Call your doctor or health care professional for advice if you get a fever, chills or sore throat, or other symptoms of a cold or flu. Do not treat yourself. This drug decreases your body's ability to fight infections. Try to avoid being around people who are sick. This medicine may increase your risk to bruise or bleed. Call your doctor or health care professional if you notice any unusual bleeding. Do not become pregnant while taking this medicine or for at least 1 month after stopping it. Women should inform their doctor if they wish to become pregnant or think they might be pregnant. Men should not father a child while taking this medicine and for at least 2 months after stopping it. There is a potential for serious side effects to an unborn child. Talk to your health care professional or pharmacist for more information. Do not breast-feed an infant while taking this medicine. What side effects may I notice from receiving this medicine? Side effects that you should report to your doctor or health care professional as soon as possible: -low blood counts - this medicine may decrease the number of white blood cells, red blood cells and platelets. You may be at increased risk for infections and bleeding. -signs of infection - fever or   chills, cough, sore throat, pain or difficulty passing urine -signs of decreased platelets or bleeding - bruising, pinpoint red spots on the skin, black, tarry stools, blood in the urine -signs of decreased red blood cells - unusual weakness or tiredness, fainting spells,  lightheadedness -increased blood sugar Side effects that usually do not require medical attention (report to your doctor or health care professional if they continue or are bothersome): -constipation -diarrhea -headache -loss of appetite -nausea, vomiting -skin rash, itching -stomach pain -water retention -weak or tired This list may not describe all possible side effects. Call your doctor for medical advice about side effects. You may report side effects to FDA at 1-800-FDA-1088. Where should I keep my medicine? This drug is given in a hospital or clinic and will not be stored at home. NOTE: This sheet is a summary. It may not cover all possible information. If you have questions about this medicine, talk to your doctor, pharmacist, or health care provider.  2018 Elsevier/Gold Standard (2015-06-30 15:52:57)  

## 2016-12-19 ENCOUNTER — Ambulatory Visit (HOSPITAL_BASED_OUTPATIENT_CLINIC_OR_DEPARTMENT_OTHER): Payer: Medicare Other

## 2016-12-19 VITALS — BP 130/69 | HR 86 | Temp 98.0°F | Resp 16

## 2016-12-19 DIAGNOSIS — D4621 Refractory anemia with excess of blasts 1: Secondary | ICD-10-CM

## 2016-12-19 DIAGNOSIS — D4620 Refractory anemia with excess of blasts, unspecified: Secondary | ICD-10-CM

## 2016-12-19 LAB — PROTEIN ELECTROPHORESIS, SERUM, WITH REFLEX
A/G Ratio: 1.2 (ref 0.7–1.7)
ALPHA 1: 0.2 g/dL (ref 0.0–0.4)
ALPHA 2: 0.9 g/dL (ref 0.4–1.0)
Albumin: 3.5 g/dL (ref 2.9–4.4)
BETA: 1.1 g/dL (ref 0.7–1.3)
GLOBULIN, TOTAL: 3 g/dL (ref 2.2–3.9)
Gamma Globulin: 0.8 g/dL (ref 0.4–1.8)
Total Protein: 6.5 g/dL (ref 6.0–8.5)

## 2016-12-19 MED ORDER — SODIUM CHLORIDE 0.9 % IV SOLN
Freq: Once | INTRAVENOUS | Status: AC
  Administered 2016-12-19: 10:00:00 via INTRAVENOUS

## 2016-12-19 MED ORDER — PROCHLORPERAZINE MALEATE 10 MG PO TABS
10.0000 mg | ORAL_TABLET | Freq: Once | ORAL | Status: AC
  Administered 2016-12-19: 10 mg via ORAL

## 2016-12-19 MED ORDER — PROCHLORPERAZINE MALEATE 10 MG PO TABS
ORAL_TABLET | ORAL | Status: AC
  Filled 2016-12-19: qty 1

## 2016-12-19 MED ORDER — HEPARIN SOD (PORK) LOCK FLUSH 100 UNIT/ML IV SOLN
500.0000 [IU] | Freq: Once | INTRAVENOUS | Status: AC | PRN
  Administered 2016-12-19: 500 [IU]
  Filled 2016-12-19: qty 5

## 2016-12-19 MED ORDER — SODIUM CHLORIDE 0.9% FLUSH
10.0000 mL | INTRAVENOUS | Status: DC | PRN
  Administered 2016-12-19: 10 mL
  Filled 2016-12-19: qty 10

## 2016-12-19 MED ORDER — SODIUM CHLORIDE 0.9 % IV SOLN
20.0000 mg/m2 | Freq: Once | INTRAVENOUS | Status: AC
  Administered 2016-12-19: 45 mg via INTRAVENOUS
  Filled 2016-12-19: qty 9

## 2016-12-19 NOTE — Progress Notes (Signed)
Ok to treat with creatinine of 1.7 per Dr Ennever. dph 

## 2016-12-19 NOTE — Patient Instructions (Signed)
Decitabine injection for infusion What is this medicine? DECITABINE (dee SYE ta been) is a chemotherapy drug. This medicine reduces the growth of cancer cells. It is used to treat adults with myelodysplastic syndromes. This medicine may be used for other purposes; ask your health care provider or pharmacist if you have questions. COMMON BRAND NAME(S): Dacogen What should I tell my health care provider before I take this medicine? They need to know if you have any of these conditions: -infection (especially a virus infection such as chickenpox, cold sores, or herpes) -kidney disease -liver disease -an unusual or allergic reaction to decitabine, other medicines, foods, dyes, or preservatives -pregnant or trying to get pregnant -breast-feeding How should I use this medicine? This medicine is for infusion into a vein. It is administered in a hospital or clinic by a doctor or health care professional. Talk to your pediatrician regarding the use of this medicine in children. Special care may be needed. Overdosage: If you think you have taken too much of this medicine contact a poison control center or emergency room at once. NOTE: This medicine is only for you. Do not share this medicine with others. What if I miss a dose? It is important not to miss your dose. Call your doctor or health care professional if you are unable to keep an appointment. What may interact with this medicine? -vaccines Talk to your doctor or health care professional before taking any of these medicines: -aspirin -acetaminophen -ibuprofen -ketoprofen -naproxen This list may not describe all possible interactions. Give your health care provider a list of all the medicines, herbs, non-prescription drugs, or dietary supplements you use. Also tell them if you smoke, drink alcohol, or use illegal drugs. Some items may interact with your medicine. What should I watch for while using this medicine? Visit your doctor for  checks on your progress. This drug may make you feel generally unwell. This is not uncommon, as chemotherapy can affect healthy cells as well as cancer cells. Report any side effects. Continue your course of treatment even though you feel ill unless your doctor tells you to stop. In some cases, you may be given additional medicines to help with side effects. Follow all directions for their use. Call your doctor or health care professional for advice if you get a fever, chills or sore throat, or other symptoms of a cold or flu. Do not treat yourself. This drug decreases your body's ability to fight infections. Try to avoid being around people who are sick. This medicine may increase your risk to bruise or bleed. Call your doctor or health care professional if you notice any unusual bleeding. Do not become pregnant while taking this medicine or for at least 1 month after stopping it. Women should inform their doctor if they wish to become pregnant or think they might be pregnant. Men should not father a child while taking this medicine and for at least 2 months after stopping it. There is a potential for serious side effects to an unborn child. Talk to your health care professional or pharmacist for more information. Do not breast-feed an infant while taking this medicine. What side effects may I notice from receiving this medicine? Side effects that you should report to your doctor or health care professional as soon as possible: -low blood counts - this medicine may decrease the number of white blood cells, red blood cells and platelets. You may be at increased risk for infections and bleeding. -signs of infection - fever or   chills, cough, sore throat, pain or difficulty passing urine -signs of decreased platelets or bleeding - bruising, pinpoint red spots on the skin, black, tarry stools, blood in the urine -signs of decreased red blood cells - unusual weakness or tiredness, fainting spells,  lightheadedness -increased blood sugar Side effects that usually do not require medical attention (report to your doctor or health care professional if they continue or are bothersome): -constipation -diarrhea -headache -loss of appetite -nausea, vomiting -skin rash, itching -stomach pain -water retention -weak or tired This list may not describe all possible side effects. Call your doctor for medical advice about side effects. You may report side effects to FDA at 1-800-FDA-1088. Where should I keep my medicine? This drug is given in a hospital or clinic and will not be stored at home. NOTE: This sheet is a summary. It may not cover all possible information. If you have questions about this medicine, talk to your doctor, pharmacist, or health care provider.  2018 Elsevier/Gold Standard (2015-06-30 15:52:57)  

## 2016-12-20 ENCOUNTER — Ambulatory Visit (HOSPITAL_BASED_OUTPATIENT_CLINIC_OR_DEPARTMENT_OTHER): Payer: Medicare Other

## 2016-12-20 VITALS — BP 154/72 | HR 99 | Temp 97.9°F | Resp 20

## 2016-12-20 DIAGNOSIS — D4621 Refractory anemia with excess of blasts 1: Secondary | ICD-10-CM | POA: Diagnosis not present

## 2016-12-20 DIAGNOSIS — D4620 Refractory anemia with excess of blasts, unspecified: Secondary | ICD-10-CM

## 2016-12-20 MED ORDER — DECITABINE CHEMO INJECTION 50 MG
20.0000 mg/m2 | Freq: Once | INTRAVENOUS | Status: AC
  Administered 2016-12-20: 45 mg via INTRAVENOUS
  Filled 2016-12-20: qty 9

## 2016-12-20 MED ORDER — SODIUM CHLORIDE 0.9 % IV SOLN
Freq: Once | INTRAVENOUS | Status: AC
  Administered 2016-12-20: 10:00:00 via INTRAVENOUS

## 2016-12-20 MED ORDER — PROCHLORPERAZINE MALEATE 10 MG PO TABS
10.0000 mg | ORAL_TABLET | Freq: Once | ORAL | Status: AC
  Administered 2016-12-20: 10 mg via ORAL

## 2016-12-20 MED ORDER — SODIUM CHLORIDE 0.9% FLUSH
10.0000 mL | INTRAVENOUS | Status: DC | PRN
  Administered 2016-12-20: 10 mL
  Filled 2016-12-20: qty 10

## 2016-12-20 MED ORDER — PROCHLORPERAZINE MALEATE 10 MG PO TABS
ORAL_TABLET | ORAL | Status: AC
  Filled 2016-12-20: qty 1

## 2016-12-20 MED ORDER — HEPARIN SOD (PORK) LOCK FLUSH 100 UNIT/ML IV SOLN
500.0000 [IU] | Freq: Once | INTRAVENOUS | Status: AC | PRN
  Administered 2016-12-20: 500 [IU]
  Filled 2016-12-20: qty 5

## 2016-12-20 NOTE — Patient Instructions (Signed)
Decitabine injection for infusion What is this medicine? DECITABINE (dee SYE ta been) is a chemotherapy drug. This medicine reduces the growth of cancer cells. It is used to treat adults with myelodysplastic syndromes. This medicine may be used for other purposes; ask your health care provider or pharmacist if you have questions. COMMON BRAND NAME(S): Dacogen What should I tell my health care provider before I take this medicine? They need to know if you have any of these conditions: -infection (especially a virus infection such as chickenpox, cold sores, or herpes) -kidney disease -liver disease -an unusual or allergic reaction to decitabine, other medicines, foods, dyes, or preservatives -pregnant or trying to get pregnant -breast-feeding How should I use this medicine? This medicine is for infusion into a vein. It is administered in a hospital or clinic by a doctor or health care professional. Talk to your pediatrician regarding the use of this medicine in children. Special care may be needed. Overdosage: If you think you have taken too much of this medicine contact a poison control center or emergency room at once. NOTE: This medicine is only for you. Do not share this medicine with others. What if I miss a dose? It is important not to miss your dose. Call your doctor or health care professional if you are unable to keep an appointment. What may interact with this medicine? -vaccines Talk to your doctor or health care professional before taking any of these medicines: -aspirin -acetaminophen -ibuprofen -ketoprofen -naproxen This list may not describe all possible interactions. Give your health care provider a list of all the medicines, herbs, non-prescription drugs, or dietary supplements you use. Also tell them if you smoke, drink alcohol, or use illegal drugs. Some items may interact with your medicine. What should I watch for while using this medicine? Visit your doctor for  checks on your progress. This drug may make you feel generally unwell. This is not uncommon, as chemotherapy can affect healthy cells as well as cancer cells. Report any side effects. Continue your course of treatment even though you feel ill unless your doctor tells you to stop. In some cases, you may be given additional medicines to help with side effects. Follow all directions for their use. Call your doctor or health care professional for advice if you get a fever, chills or sore throat, or other symptoms of a cold or flu. Do not treat yourself. This drug decreases your body's ability to fight infections. Try to avoid being around people who are sick. This medicine may increase your risk to bruise or bleed. Call your doctor or health care professional if you notice any unusual bleeding. Do not become pregnant while taking this medicine or for at least 1 month after stopping it. Women should inform their doctor if they wish to become pregnant or think they might be pregnant. Men should not father a child while taking this medicine and for at least 2 months after stopping it. There is a potential for serious side effects to an unborn child. Talk to your health care professional or pharmacist for more information. Do not breast-feed an infant while taking this medicine. What side effects may I notice from receiving this medicine? Side effects that you should report to your doctor or health care professional as soon as possible: -low blood counts - this medicine may decrease the number of white blood cells, red blood cells and platelets. You may be at increased risk for infections and bleeding. -signs of infection - fever or   chills, cough, sore throat, pain or difficulty passing urine -signs of decreased platelets or bleeding - bruising, pinpoint red spots on the skin, black, tarry stools, blood in the urine -signs of decreased red blood cells - unusual weakness or tiredness, fainting spells,  lightheadedness -increased blood sugar Side effects that usually do not require medical attention (report to your doctor or health care professional if they continue or are bothersome): -constipation -diarrhea -headache -loss of appetite -nausea, vomiting -skin rash, itching -stomach pain -water retention -weak or tired This list may not describe all possible side effects. Call your doctor for medical advice about side effects. You may report side effects to FDA at 1-800-FDA-1088. Where should I keep my medicine? This drug is given in a hospital or clinic and will not be stored at home. NOTE: This sheet is a summary. It may not cover all possible information. If you have questions about this medicine, talk to your doctor, pharmacist, or health care provider.  2018 Elsevier/Gold Standard (2015-06-30 15:52:57)  

## 2016-12-21 ENCOUNTER — Ambulatory Visit (HOSPITAL_BASED_OUTPATIENT_CLINIC_OR_DEPARTMENT_OTHER): Payer: Medicare Other

## 2016-12-21 VITALS — BP 125/57 | HR 88 | Temp 97.4°F | Resp 20

## 2016-12-21 DIAGNOSIS — D4621 Refractory anemia with excess of blasts 1: Secondary | ICD-10-CM | POA: Diagnosis not present

## 2016-12-21 DIAGNOSIS — D4620 Refractory anemia with excess of blasts, unspecified: Secondary | ICD-10-CM

## 2016-12-21 MED ORDER — SODIUM CHLORIDE 0.9 % IV SOLN
20.0000 mg/m2 | Freq: Once | INTRAVENOUS | Status: AC
  Administered 2016-12-21: 45 mg via INTRAVENOUS
  Filled 2016-12-21: qty 9

## 2016-12-21 MED ORDER — HEPARIN SOD (PORK) LOCK FLUSH 100 UNIT/ML IV SOLN
500.0000 [IU] | Freq: Once | INTRAVENOUS | Status: AC | PRN
  Administered 2016-12-21: 500 [IU]
  Filled 2016-12-21: qty 5

## 2016-12-21 MED ORDER — PROCHLORPERAZINE MALEATE 10 MG PO TABS
ORAL_TABLET | ORAL | Status: AC
  Filled 2016-12-21: qty 1

## 2016-12-21 MED ORDER — SODIUM CHLORIDE 0.9% FLUSH
10.0000 mL | INTRAVENOUS | Status: DC | PRN
  Administered 2016-12-21: 10 mL
  Filled 2016-12-21: qty 10

## 2016-12-21 MED ORDER — SODIUM CHLORIDE 0.9 % IV SOLN
Freq: Once | INTRAVENOUS | Status: AC
  Administered 2016-12-21: 10:00:00 via INTRAVENOUS

## 2016-12-21 MED ORDER — PROCHLORPERAZINE MALEATE 10 MG PO TABS
10.0000 mg | ORAL_TABLET | Freq: Once | ORAL | Status: AC
  Administered 2016-12-21: 10 mg via ORAL

## 2016-12-21 NOTE — Patient Instructions (Signed)
[facility] Discharge Instructions for Patients Receiving Chemotherapy  Today you received the following chemotherapy agents:  Dacogen  To help prevent nausea and vomiting after your treatment, we encourage you to take your nausea medication as ordered per MD.    If you develop nausea and vomiting that is not controlled by your nausea medication, call the clinic.   BELOW ARE SYMPTOMS THAT SHOULD BE REPORTED IMMEDIATELY:  *FEVER GREATER THAN 100.5 F  *CHILLS WITH OR WITHOUT FEVER  NAUSEA AND VOMITING THAT IS NOT CONTROLLED WITH YOUR NAUSEA MEDICATION  *UNUSUAL SHORTNESS OF BREATH  *UNUSUAL BRUISING OR BLEEDING  TENDERNESS IN MOUTH AND THROAT WITH OR WITHOUT PRESENCE OF ULCERS  *URINARY PROBLEMS  *BOWEL PROBLEMS  UNUSUAL RASH Items with * indicate a potential emergency and should be followed up as soon as possible.  Feel free to call the clinic you have any questions or concerns. The clinic phone number is [phone].  Please show the CHEMO ALERT CARD at check-in to the Emergency Department and triage nurse.   

## 2016-12-28 ENCOUNTER — Telehealth: Payer: Self-pay | Admitting: Interventional Cardiology

## 2016-12-28 NOTE — Telephone Encounter (Signed)
Spoke with pt and he states that PCP, Dr. Olen Pel did a xray of his back in January and he noted some plaque build up on pt's aorta.  Pt currently doing chemo tx every 5 weeks.  States he has been very weak recently.  SOB is unchanged from his norm, denies CP and dizziness.  Pt then told me that he passed out this morning, denies injury.  He said this is the first time it has happened this year but he passed out 5 times last year.  Advised pt that normally with initial occurrence of syncope we normally have pt proceed to ER for eval as we do not have the proper equipment to work him up for possibilities for why he passed out.  Discussed that they normally do a CT.  Advised pt to proceed to ER.  Pt states he would like to call his PCP first and see if they will just order a CT or if they also prefer he go to ER.  Pt wanting sooner appt with Dr. Tamala Julian.  Advised I will call him on Monday and see about moving his appt if he is not in the hospital.  Pt very appreciative for call.  Will route to Dr. Tamala Julian to make him aware.

## 2016-12-28 NOTE — Telephone Encounter (Signed)
Patient calling, patient takes chemo. Patient has been feeling very weak and has passed out a few times. 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   Patient is worried and states that he feels the same way as he did when Dr. Tamala Julian installed the stents. Patient is scheduled for 01-28-17 but is hoping to come in sooner.

## 2016-12-31 NOTE — Telephone Encounter (Signed)
Follow up  ° °Patient returning call back to nurse from today .  °

## 2016-12-31 NOTE — Telephone Encounter (Signed)
Left message to call back  

## 2016-12-31 NOTE — Telephone Encounter (Signed)
Spoke with pt and he did go to [facility] to be seen.  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  He said these episodes occur when he changes positions quickly, usually from bending over to standing and they just last a few seconds. Pt states ER physician recommended he be mindful of position changes, increase fluid intake and f/u with PCP soon.  Asked pt about coming in to see Dr. Tamala Julian sooner.  He said he did not feel like this was necessary at this time and he would keep August appt.  Advised to call sooner if any changes.  Pt very appreciative for call.

## 2016-12-31 NOTE — Telephone Encounter (Signed)
Therapy for his underlying heart condition is also management of the plaque buildup in the aorta. Nothing further needs to be done.  The fainting episodes are a different matter. Why did he pass out 5 times last year? Is a similar to what happened last year?  If this fainting episode is no different than prior, he probably needs no further workup.

## 2017-01-28 ENCOUNTER — Other Ambulatory Visit (HOSPITAL_BASED_OUTPATIENT_CLINIC_OR_DEPARTMENT_OTHER): Payer: Medicare Other

## 2017-01-28 ENCOUNTER — Ambulatory Visit (HOSPITAL_BASED_OUTPATIENT_CLINIC_OR_DEPARTMENT_OTHER): Payer: Medicare Other | Admitting: Hematology & Oncology

## 2017-01-28 ENCOUNTER — Ambulatory Visit: Payer: Medicare Other | Admitting: Interventional Cardiology

## 2017-01-28 ENCOUNTER — Other Ambulatory Visit: Payer: Self-pay | Admitting: *Deleted

## 2017-01-28 ENCOUNTER — Ambulatory Visit (HOSPITAL_BASED_OUTPATIENT_CLINIC_OR_DEPARTMENT_OTHER): Payer: Medicare Other

## 2017-01-28 ENCOUNTER — Ambulatory Visit: Payer: Medicare Other

## 2017-01-28 VITALS — BP 113/46 | HR 87 | Temp 97.9°F | Resp 20 | Wt 217.0 lb

## 2017-01-28 DIAGNOSIS — F419 Anxiety disorder, unspecified: Secondary | ICD-10-CM

## 2017-01-28 DIAGNOSIS — D4621 Refractory anemia with excess of blasts 1: Secondary | ICD-10-CM | POA: Diagnosis not present

## 2017-01-28 DIAGNOSIS — D4620 Refractory anemia with excess of blasts, unspecified: Secondary | ICD-10-CM

## 2017-01-28 DIAGNOSIS — R110 Nausea: Secondary | ICD-10-CM

## 2017-01-28 LAB — CMP (CANCER CENTER ONLY)
ALK PHOS: 43 U/L (ref 26–84)
ALT: 22 U/L (ref 10–47)
AST: 26 U/L (ref 11–38)
Albumin: 3.4 g/dL (ref 3.3–5.5)
BILIRUBIN TOTAL: 0.6 mg/dL (ref 0.20–1.60)
BUN: 18 mg/dL (ref 7–22)
CALCIUM: 8.5 mg/dL (ref 8.0–10.3)
CO2: 29 mEq/L (ref 18–33)
Chloride: 104 mEq/L (ref 98–108)
Creat: 1.6 mg/dl — ABNORMAL HIGH (ref 0.6–1.2)
Glucose, Bld: 105 mg/dL (ref 73–118)
POTASSIUM: 3.8 meq/L (ref 3.3–4.7)
Sodium: 141 mEq/L (ref 128–145)
TOTAL PROTEIN: 6.8 g/dL (ref 6.4–8.1)

## 2017-01-28 LAB — CBC WITH DIFFERENTIAL (CANCER CENTER ONLY)
BASO#: 0 10*3/uL (ref 0.0–0.2)
BASO%: 0.6 % (ref 0.0–2.0)
EOS%: 1 % (ref 0.0–7.0)
Eosinophils Absolute: 0.1 10*3/uL (ref 0.0–0.5)
HEMATOCRIT: 35.8 % — AB (ref 38.7–49.9)
HGB: 12.1 g/dL — ABNORMAL LOW (ref 13.0–17.1)
LYMPH#: 1 10*3/uL (ref 0.9–3.3)
LYMPH%: 15.4 % (ref 14.0–48.0)
MCH: 34.8 pg — ABNORMAL HIGH (ref 28.0–33.4)
MCHC: 33.8 g/dL (ref 32.0–35.9)
MCV: 103 fL — AB (ref 82–98)
MONO#: 0.9 10*3/uL (ref 0.1–0.9)
MONO%: 13.5 % — ABNORMAL HIGH (ref 0.0–13.0)
NEUT%: 69.5 % (ref 40.0–80.0)
NEUTROS ABS: 4.7 10*3/uL (ref 1.5–6.5)
PLATELETS: 271 10*3/uL (ref 145–400)
RBC: 3.48 10*6/uL — AB (ref 4.20–5.70)
RDW: 15.8 % — ABNORMAL HIGH (ref 11.1–15.7)
WBC: 6.8 10*3/uL (ref 4.0–10.0)

## 2017-01-28 LAB — IRON AND TIBC
%SAT: 35 % (ref 20–55)
Iron: 97 ug/dL (ref 42–163)
TIBC: 274 ug/dL (ref 202–409)
UIBC: 177 ug/dL (ref 117–376)

## 2017-01-28 LAB — TECHNOLOGIST REVIEW CHCC SATELLITE

## 2017-01-28 LAB — LACTATE DEHYDROGENASE: LDH: 267 U/L — AB (ref 125–245)

## 2017-01-28 LAB — FERRITIN: Ferritin: 1939 ng/ml — ABNORMAL HIGH (ref 22–316)

## 2017-01-28 MED ORDER — PROCHLORPERAZINE MALEATE 10 MG PO TABS
10.0000 mg | ORAL_TABLET | Freq: Once | ORAL | Status: AC
  Administered 2017-01-28: 10 mg via ORAL

## 2017-01-28 MED ORDER — LORAZEPAM 0.5 MG PO TABS: 0.5000 mg | ORAL_TABLET | Freq: Four times a day (QID) | ORAL | 0 refills | Status: DC | PRN

## 2017-01-28 MED ORDER — PROCHLORPERAZINE MALEATE 10 MG PO TABS
ORAL_TABLET | ORAL | Status: AC
Start: 2017-01-28 — End: 2017-01-28
  Filled 2017-01-28: qty 1

## 2017-01-28 MED ORDER — SODIUM CHLORIDE 0.9 % IV SOLN
Freq: Once | INTRAVENOUS | Status: AC
  Administered 2017-01-28: 10:00:00 via INTRAVENOUS

## 2017-01-28 MED ORDER — SODIUM CHLORIDE 0.9% FLUSH
10.0000 mL | INTRAVENOUS | Status: DC | PRN
  Administered 2017-01-28: 10 mL
  Filled 2017-01-28: qty 10

## 2017-01-28 MED ORDER — HEPARIN SOD (PORK) LOCK FLUSH 100 UNIT/ML IV SOLN
500.0000 [IU] | Freq: Once | INTRAVENOUS | Status: AC | PRN
  Administered 2017-01-28: 500 [IU]
  Filled 2017-01-28: qty 5

## 2017-01-28 MED ORDER — SODIUM CHLORIDE 0.9 % IV SOLN
20.0000 mg/m2 | Freq: Once | INTRAVENOUS | Status: AC
  Administered 2017-01-28: 45 mg via INTRAVENOUS
  Filled 2017-01-28: qty 9

## 2017-01-28 MED FILL — LORazepam 0.5 MG TABS: 0.5 | 23 days supply | Qty: 90 | Fill #0

## 2017-01-28 NOTE — Patient Instructions (Signed)
[facility] Discharge Instructions for Patients Receiving Chemotherapy  Today you received the following chemotherapy agents Decitabine  To help prevent nausea and vomiting after your treatment, we encourage you to take your nausea medication    If you develop nausea and vomiting that is not controlled by your nausea medication, call the clinic.   BELOW ARE SYMPTOMS THAT SHOULD BE REPORTED IMMEDIATELY:  *FEVER GREATER THAN 100.5 F  *CHILLS WITH OR WITHOUT FEVER  NAUSEA AND VOMITING THAT IS NOT CONTROLLED WITH YOUR NAUSEA MEDICATION  *UNUSUAL SHORTNESS OF BREATH  *UNUSUAL BRUISING OR BLEEDING  TENDERNESS IN MOUTH AND THROAT WITH OR WITHOUT PRESENCE OF ULCERS  *URINARY PROBLEMS  *BOWEL PROBLEMS  UNUSUAL RASH Items with * indicate a potential emergency and should be followed up as soon as possible.  Feel free to call the clinic you have any questions or concerns. The clinic phone number is [phone].  Please show the [place] at check-in to the Emergency Department and triage nurse.

## 2017-01-28 NOTE — Patient Instructions (Signed)
Implanted Port Insertion, Care After °This sheet gives you information about how to care for yourself after your procedure. Your health care provider may also give you more specific instructions. If you have problems or questions, contact your health care provider. °What can I expect after the procedure? °After your procedure, it is common to have: °· Discomfort at the port insertion site. °· Bruising on the skin over the port. This should improve over 3-4 days. ° °Follow these instructions at home: °Port care °· After your port is placed, you will get a manufacturer's information card. The card has information about your port. Keep this card with you at all times. °· Take care of the port as told by your health care provider. Ask your health care provider if you or a family member can get training for taking care of the port at home. A home health care nurse may also take care of the port. °· Make sure to remember what type of port you have. °Incision care °· Follow instructions from your health care provider about how to take care of your port insertion site. Make sure you: °? Wash your hands with soap and water before you change your bandage (dressing). If soap and water are not available, use hand sanitizer. °? Change your dressing as told by your health care provider. °? Leave stitches (sutures), skin glue, or adhesive strips in place. These skin closures may need to stay in place for 2 weeks or longer. If adhesive strip edges start to loosen and curl up, you may trim the loose edges. Do not remove adhesive strips completely unless your health care provider tells you to do that. °· Check your port insertion site every day for signs of infection. Check for: °? More redness, swelling, or pain. °? More fluid or blood. °? Warmth. °? Pus or a bad smell. °General instructions °· Do not take baths, swim, or use a hot tub until your health care provider approves. °· Do not lift anything that is heavier than 10 lb (4.5  kg) for a week, or as told by your health care provider. °· Ask your health care provider when it is okay to: °? Return to work or school. °? Resume usual physical activities or sports. °· Do not drive for 24 hours if you were given a medicine to help you relax (sedative). °· Take over-the-counter and prescription medicines only as told by your health care provider. °· Wear a medical alert bracelet in case of an emergency. This will tell any health care providers that you have a port. °· Keep all follow-up visits as told by your health care provider. This is important. °Contact a health care provider if: °· You cannot flush your port with saline as directed, or you cannot draw blood from the port. °· You have a fever or chills. °· You have more redness, swelling, or pain around your port insertion site. °· You have more fluid or blood coming from your port insertion site. °· Your port insertion site feels warm to the touch. °· You have pus or a bad smell coming from the port insertion site. °Get help right away if: °· You have chest pain or shortness of breath. °· You have bleeding from your port that you cannot control. °Summary °· Take care of the port as told by your health care provider. °· Change your dressing as told by your health care provider. °· Keep all follow-up visits as told by your health care provider. °  This information is not intended to replace advice given to you by your health care provider. Make sure you discuss any questions you have with your health care provider. °Document Released: 03/18/2013 Document Revised: 04/18/2016 Document Reviewed: 04/18/2016 °Elsevier Interactive Patient Education © 2017 [employer] ° °

## 2017-01-28 NOTE — Progress Notes (Signed)
Hematology and Oncology Follow Up Visit  Anthony [name] [id number] [birth year] 76 y.o. 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 He is doing quite well. 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 He says these have made a world of difference. He can hear so much better. His wife is very thankful for the hearing aids.  He still has some occasional dizziness. This does not appear to be as bad.  His iron studies have done quite well. His ferritin back in early July was 2000. His iron saturation was only 35%. As such, a lot of the ferritin elevation probably is acute phase reactant.  He has done incredibly well with treatment. He has not been transfused with blood since August of last year.  His appetite is good. He had a good July 4 holiday. He had no issues with nausea or vomiting. He's had no cough. He's had no fever.  Overall, his performance status is ECOG 1.    Medications:  Allergies as of 01/28/2017   No Known Allergies     Medication List       Accurate as of 01/28/17  9:36 AM. Always use your most recent med list.          bimatoprost 0.01 % Soln Commonly known as:  LUMIGAN Place 1 drop into both eyes at bedtime.   budesonide-formoterol 160-4.5 MCG/ACT inhaler Commonly known as:  SYMBICORT Inhale 2 puffs into the lungs 2 (two) times daily.   buPROPion 150 MG 24 hr tablet Commonly known as:  WELLBUTRIN XL Take 150 mg by mouth daily.   clopidogrel 75 MG tablet Commonly known as:  PLAVIX Take 75 mg by mouth daily.   co-enzyme Q-10 30 MG capsule Take 100 mg by mouth daily.   Cyanocobalamin 2500 MCG Tabs Take 5,000 mcg  by mouth daily.   fish oil-omega-3 fatty acids 1000 MG capsule Take 1 g by mouth daily.   glucosamine-chondroitin 500-400 MG tablet Take 1 tablet by mouth daily.   LORazepam 0.5 MG tablet Commonly known as:  ATIVAN Take 1 tablet (0.5 mg total) by mouth every 6 (six) hours as needed (Nausea or vomiting).   losartan 50 MG tablet Commonly known as:  COZAAR   magnesium oxide 400 MG tablet Commonly known as:  MAG-OX Take 400 mg by mouth daily.   orphenadrine 100 MG tablet Commonly known as:  NORFLEX Take 1 tablet (100 mg total) by mouth 2 (two) times daily as needed for muscle spasms.   pantoprazole 40 MG tablet Commonly known as:  PROTONIX Take 40 mg by mouth daily.   predniSONE 20 MG tablet Commonly known as:  DELTASONE Take 1 tablet (20 mg total) by mouth daily with breakfast.   PROAIR RESPICLICK 381 (90 Base) MCG/ACT Aepb Generic drug:  Albuterol Sulfate Inhale 2 puffs into the lungs every 6 (six) hours as needed.   prochlorperazine 10 MG tablet Commonly known as:  COMPAZINE Take 1 tablet (10 mg total) by mouth every 6 (six) hours as needed (Nausea or vomiting).   ranitidine 300 MG tablet Commonly known as:  ZANTAC Take  300 mg by mouth at bedtime.   sertraline 100 MG tablet Commonly known as:  ZOLOFT Take 100 mg by mouth daily.   SIMBRINZA 1-0.2 % Susp Generic drug:  Brinzolamide-Brimonidine   simvastatin 40 MG tablet Commonly known as:  ZOCOR Take 40 mg by mouth every other day.   tamsulosin 0.4 MG Caps capsule Commonly known as:  FLOMAX   tiotropium 18 MCG inhalation capsule Commonly known as:  SPIRIVA Place 18 mcg into inhaler and inhale daily.   triamcinolone cream 0.1 % Commonly known as:  KENALOG APPLY TO RASH/ITCH ON ARMS TWICE DAILY AS NEEDED   Vitamin D3 5000 units Tabs Take by mouth daily.       Allergies: No Known Allergies  Past Medical History, Surgical history, Social history, and Family History were reviewed and updated.  Review  of Systems: All other 10 point review of systems is negative.   Physical Exam:  weight is 217 lb (98.4 kg). His oral temperature is 97.9 F (36.6 C). His blood pressure is 113/46 (abnormal) and his pulse is 87. His respiration is 20 and oxygen saturation is 97%.   Wt Readings from Last 3 Encounters:  01/28/17 217 lb (98.4 kg)  12/17/16 215 lb (97.5 kg)  11/06/16 214 lb 12.8 oz (97.4 kg)    Well-developed and well-nourished white male in no obvious distress. Head and neck exam shows no ocular or oral lesions. He has no palpable cervical or supraclavicular lymph nodes. Lungs are clear. Cardiac exam regular rate and rhythm with no murmurs, rubs or bruits. Abdomen is soft. He has good bowel sounds. There is no fluid wave. There is no palpable liver or spleen tip. Back exam shows no tenderness over the spine, ribs or hips. Extremities shows no clubbing, cyanosis or edema. Skin exam shows no rashes, ecchymoses or petechia. Neurological exam shows no focal neurological deficits. oriented, appropriate affect  Lab Results  Component Value Date   WBC 6.8 01/28/2017   HGB 12.1 (L) 01/28/2017   HCT 35.8 (L) 01/28/2017   MCV 103 (H) 01/28/2017   PLT 271 01/28/2017   Lab Results  Component Value Date   FERRITIN 2,060 (H) 12/17/2016   IRON 104 12/17/2016   TIBC 295 12/17/2016   UIBC 191 12/17/2016   IRONPCTSAT 35 12/17/2016   Lab Results  Component Value Date   RETICCTPCT 1.7 05/30/2015   RBC 3.48 (L) 01/28/2017   RETICCTABS 39.4 05/30/2015   Lab Results  Component Value Date   KAPLAMBRATIO 1.11 12/17/2016   Lab Results  Component Value Date   IGGSERUM 735 12/17/2016   IGMSERUM 79 12/17/2016   Lab Results  Component Value Date   TOTALPROTELP 7.3 01/01/2014   ALBUMINELP 55.1 (L) 01/01/2014   A1GS 6.9 (H) 01/01/2014   A2GS 8.9 01/01/2014   BETS 7.9 (H) 01/01/2014   BETA2SER 6.7 (H) 01/01/2014   GAMS 14.5 01/01/2014   MSPIKE Not Observed 12/17/2016   SPEI * 01/01/2014      Chemistry      Component Value Date/Time   NA 141 01/28/2017 0834   NA 140 04/16/2016 0930   K 3.8 01/28/2017 0834   K 3.9 04/16/2016 0930   CL 104 01/28/2017 0834   CO2 29 01/28/2017 0834   CO2 24 04/16/2016 0930   BUN 18 01/28/2017 0834   BUN 20.5 04/16/2016 0930   CREATININE 1.6 (H) 01/28/2017 0834   CREATININE 1.2 04/16/2016 0930      Component Value Date/Time   CALCIUM 8.5 01/28/2017  0488   CALCIUM 8.9 04/16/2016 0930   ALKPHOS 43 01/28/2017 0834   ALKPHOS 54 04/16/2016 0930   AST 26 01/28/2017 0834   AST 16 04/16/2016 0930   ALT 22 01/28/2017 0834   ALT 21 04/16/2016 0930   BILITOT 0.60 01/28/2017 0834   BILITOT 0.27 04/16/2016 0930     Impression and Plan: Anthony [name] is 76 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   He is still responding quite nicely. I'm just glad that he is doing so well. Again, he is responded very nicely to therapy.  I do not see need for a bone marrow test on him. With his blood counts doing so well, I just don't think that a bone marrow test really would provide much further information.   I will now give him 6 weeks off in between treatments. I really think he would do well with this. Either you will help contribute to his food quality of life.   I'll plan to see back in 7 weeks.   Volanda Napoleon, MD 8/20/20189:36 AM

## 2017-01-29 ENCOUNTER — Ambulatory Visit (HOSPITAL_BASED_OUTPATIENT_CLINIC_OR_DEPARTMENT_OTHER): Payer: Medicare Other

## 2017-01-29 VITALS — BP 117/61 | HR 81 | Temp 98.1°F | Resp 19

## 2017-01-29 DIAGNOSIS — Z5111 Encounter for antineoplastic chemotherapy: Secondary | ICD-10-CM

## 2017-01-29 DIAGNOSIS — D4621 Refractory anemia with excess of blasts 1: Secondary | ICD-10-CM | POA: Diagnosis not present

## 2017-01-29 DIAGNOSIS — D4620 Refractory anemia with excess of blasts, unspecified: Secondary | ICD-10-CM

## 2017-01-29 MED ORDER — SODIUM CHLORIDE 0.9 % IV SOLN
Freq: Once | INTRAVENOUS | Status: AC
  Administered 2017-01-29: 10:00:00 via INTRAVENOUS

## 2017-01-29 MED ORDER — PROCHLORPERAZINE MALEATE 10 MG PO TABS
10.0000 mg | ORAL_TABLET | Freq: Once | ORAL | Status: AC
  Administered 2017-01-29: 10 mg via ORAL

## 2017-01-29 MED ORDER — HEPARIN SOD (PORK) LOCK FLUSH 100 UNIT/ML IV SOLN
500.0000 [IU] | Freq: Once | INTRAVENOUS | Status: AC | PRN
  Administered 2017-01-29: 500 [IU]
  Filled 2017-01-29: qty 5

## 2017-01-29 MED ORDER — SODIUM CHLORIDE 0.9% FLUSH
10.0000 mL | INTRAVENOUS | Status: DC | PRN
  Administered 2017-01-29: 10 mL
  Filled 2017-01-29: qty 10

## 2017-01-29 MED ORDER — PROCHLORPERAZINE MALEATE 10 MG PO TABS
ORAL_TABLET | ORAL | Status: AC
  Filled 2017-01-29: qty 1

## 2017-01-29 MED ORDER — SODIUM CHLORIDE 0.9 % IV SOLN
20.0000 mg/m2 | Freq: Once | INTRAVENOUS | Status: AC
  Administered 2017-01-29: 45 mg via INTRAVENOUS
  Filled 2017-01-29: qty 9

## 2017-01-29 NOTE — Patient Instructions (Signed)
[facility] Discharge Instructions for Patients Receiving Chemotherapy  Today you received the following chemotherapy agents:  Dacogen  To help prevent nausea and vomiting after your treatment, we encourage you to take your nausea medication as ordered per MD.    If you develop nausea and vomiting that is not controlled by your nausea medication, call the clinic.   BELOW ARE SYMPTOMS THAT SHOULD BE REPORTED IMMEDIATELY:  *FEVER GREATER THAN 100.5 F  *CHILLS WITH OR WITHOUT FEVER  NAUSEA AND VOMITING THAT IS NOT CONTROLLED WITH YOUR NAUSEA MEDICATION  *UNUSUAL SHORTNESS OF BREATH  *UNUSUAL BRUISING OR BLEEDING  TENDERNESS IN MOUTH AND THROAT WITH OR WITHOUT PRESENCE OF ULCERS  *URINARY PROBLEMS  *BOWEL PROBLEMS  UNUSUAL RASH Items with * indicate a potential emergency and should be followed up as soon as possible.  Feel free to call the clinic you have any questions or concerns. The clinic phone number is [phone].  Please show the CHEMO ALERT CARD at check-in to the Emergency Department and triage nurse.   

## 2017-01-30 ENCOUNTER — Ambulatory Visit (HOSPITAL_BASED_OUTPATIENT_CLINIC_OR_DEPARTMENT_OTHER): Payer: Medicare Other

## 2017-01-30 VITALS — BP 146/69 | HR 89 | Temp 97.6°F | Resp 18

## 2017-01-30 DIAGNOSIS — Z5111 Encounter for antineoplastic chemotherapy: Secondary | ICD-10-CM | POA: Diagnosis not present

## 2017-01-30 DIAGNOSIS — D4620 Refractory anemia with excess of blasts, unspecified: Secondary | ICD-10-CM | POA: Diagnosis not present

## 2017-01-30 MED ORDER — SODIUM CHLORIDE 0.9 % IV SOLN
Freq: Once | INTRAVENOUS | Status: AC
  Administered 2017-01-30: 10:00:00 via INTRAVENOUS

## 2017-01-30 MED ORDER — PROCHLORPERAZINE MALEATE 10 MG PO TABS
10.0000 mg | ORAL_TABLET | Freq: Once | ORAL | Status: AC
  Administered 2017-01-30: 10 mg via ORAL

## 2017-01-30 MED ORDER — SODIUM CHLORIDE 0.9% FLUSH
10.0000 mL | INTRAVENOUS | Status: DC | PRN
  Administered 2017-01-30: 10 mL
  Filled 2017-01-30: qty 10

## 2017-01-30 MED ORDER — HEPARIN SOD (PORK) LOCK FLUSH 100 UNIT/ML IV SOLN
500.0000 [IU] | Freq: Once | INTRAVENOUS | Status: AC | PRN
  Administered 2017-01-30: 500 [IU]
  Filled 2017-01-30: qty 5

## 2017-01-30 MED ORDER — PROCHLORPERAZINE MALEATE 10 MG PO TABS
ORAL_TABLET | ORAL | Status: AC
  Filled 2017-01-30: qty 1

## 2017-01-30 MED ORDER — SODIUM CHLORIDE 0.9 % IV SOLN
20.0000 mg/m2 | Freq: Once | INTRAVENOUS | Status: AC
  Administered 2017-01-30: 45 mg via INTRAVENOUS
  Filled 2017-01-30: qty 9

## 2017-01-30 NOTE — Patient Instructions (Signed)
Decitabine injection for infusion What is this medicine? DECITABINE (dee SYE ta been) is a chemotherapy drug. This medicine reduces the growth of cancer cells. It is used to treat adults with myelodysplastic syndromes. This medicine may be used for other purposes; ask your health care provider or pharmacist if you have questions. COMMON BRAND NAME(S): Dacogen What should I tell my health care provider before I take this medicine? They need to know if you have any of these conditions: -infection (especially a virus infection such as chickenpox, cold sores, or herpes) -kidney disease -liver disease -an unusual or allergic reaction to decitabine, other medicines, foods, dyes, or preservatives -pregnant or trying to get pregnant -breast-feeding How should I use this medicine? This medicine is for infusion into a vein. It is administered in a hospital or clinic by a doctor or health care professional. Talk to your pediatrician regarding the use of this medicine in children. Special care may be needed. Overdosage: If you think you have taken too much of this medicine contact a poison control center or emergency room at once. NOTE: This medicine is only for you. Do not share this medicine with others. What if I miss a dose? It is important not to miss your dose. Call your doctor or health care professional if you are unable to keep an appointment. What may interact with this medicine? -vaccines Talk to your doctor or health care professional before taking any of these medicines: -aspirin -acetaminophen -ibuprofen -ketoprofen -naproxen This list may not describe all possible interactions. Give your health care provider a list of all the medicines, herbs, non-prescription drugs, or dietary supplements you use. Also tell them if you smoke, drink alcohol, or use illegal drugs. Some items may interact with your medicine. What should I watch for while using this medicine? Visit your doctor for  checks on your progress. This drug may make you feel generally unwell. This is not uncommon, as chemotherapy can affect healthy cells as well as cancer cells. Report any side effects. Continue your course of treatment even though you feel ill unless your doctor tells you to stop. In some cases, you may be given additional medicines to help with side effects. Follow all directions for their use. Call your doctor or health care professional for advice if you get a fever, chills or sore throat, or other symptoms of a cold or flu. Do not treat yourself. This drug decreases your body's ability to fight infections. Try to avoid being around people who are sick. This medicine may increase your risk to bruise or bleed. Call your doctor or health care professional if you notice any unusual bleeding. Do not become pregnant while taking this medicine or for at least 1 month after stopping it. Women should inform their doctor if they wish to become pregnant or think they might be pregnant. Men should not father a child while taking this medicine and for at least 2 months after stopping it. There is a potential for serious side effects to an unborn child. Talk to your health care professional or pharmacist for more information. Do not breast-feed an infant while taking this medicine. What side effects may I notice from receiving this medicine? Side effects that you should report to your doctor or health care professional as soon as possible: -low blood counts - this medicine may decrease the number of white blood cells, red blood cells and platelets. You may be at increased risk for infections and bleeding. -signs of infection - fever or   chills, cough, sore throat, pain or difficulty passing urine -signs of decreased platelets or bleeding - bruising, pinpoint red spots on the skin, black, tarry stools, blood in the urine -signs of decreased red blood cells - unusual weakness or tiredness, fainting spells,  lightheadedness -increased blood sugar Side effects that usually do not require medical attention (report to your doctor or health care professional if they continue or are bothersome): -constipation -diarrhea -headache -loss of appetite -nausea, vomiting -skin rash, itching -stomach pain -water retention -weak or tired This list may not describe all possible side effects. Call your doctor for medical advice about side effects. You may report side effects to FDA at 1-800-FDA-1088. Where should I keep my medicine? This drug is given in a hospital or clinic and will not be stored at home. NOTE: This sheet is a summary. It may not cover all possible information. If you have questions about this medicine, talk to your doctor, pharmacist, or health care provider.  2018 Elsevier/Gold Standard (2015-06-30 15:52:57)  

## 2017-01-31 ENCOUNTER — Ambulatory Visit (HOSPITAL_BASED_OUTPATIENT_CLINIC_OR_DEPARTMENT_OTHER): Payer: Medicare Other

## 2017-01-31 VITALS — BP 133/47 | HR 87 | Temp 97.9°F | Resp 18

## 2017-01-31 DIAGNOSIS — Z5112 Encounter for antineoplastic immunotherapy: Secondary | ICD-10-CM | POA: Diagnosis not present

## 2017-01-31 DIAGNOSIS — D4621 Refractory anemia with excess of blasts 1: Secondary | ICD-10-CM

## 2017-01-31 DIAGNOSIS — D4620 Refractory anemia with excess of blasts, unspecified: Secondary | ICD-10-CM

## 2017-01-31 MED ORDER — SODIUM CHLORIDE 0.9 % IV SOLN
Freq: Once | INTRAVENOUS | Status: AC
  Administered 2017-01-31: 10:00:00 via INTRAVENOUS

## 2017-01-31 MED ORDER — HEPARIN SOD (PORK) LOCK FLUSH 100 UNIT/ML IV SOLN
500.0000 [IU] | Freq: Once | INTRAVENOUS | Status: AC | PRN
  Administered 2017-01-31: 500 [IU]
  Filled 2017-01-31: qty 5

## 2017-01-31 MED ORDER — SODIUM CHLORIDE 0.9 % IV SOLN
20.0000 mg/m2 | Freq: Once | INTRAVENOUS | Status: AC
  Administered 2017-01-31: 45 mg via INTRAVENOUS
  Filled 2017-01-31: qty 9

## 2017-01-31 MED ORDER — PROCHLORPERAZINE MALEATE 10 MG PO TABS
10.0000 mg | ORAL_TABLET | Freq: Once | ORAL | Status: AC
  Administered 2017-01-31: 10 mg via ORAL

## 2017-01-31 MED ORDER — SODIUM CHLORIDE 0.9% FLUSH
10.0000 mL | INTRAVENOUS | Status: DC | PRN
  Administered 2017-01-31: 10 mL
  Filled 2017-01-31: qty 10

## 2017-01-31 MED ORDER — PROCHLORPERAZINE MALEATE 10 MG PO TABS
ORAL_TABLET | ORAL | Status: AC
  Filled 2017-01-31: qty 1

## 2017-01-31 NOTE — Patient Instructions (Signed)
Decitabine injection for infusion What is this medicine? DECITABINE (dee SYE ta been) is a chemotherapy drug. This medicine reduces the growth of cancer cells. It is used to treat adults with myelodysplastic syndromes. This medicine may be used for other purposes; ask your health care provider or pharmacist if you have questions. COMMON BRAND NAME(S): Dacogen What should I tell my health care provider before I take this medicine? They need to know if you have any of these conditions: -infection (especially a virus infection such as chickenpox, cold sores, or herpes) -kidney disease -liver disease -an unusual or allergic reaction to decitabine, other medicines, foods, dyes, or preservatives -pregnant or trying to get pregnant -breast-feeding How should I use this medicine? This medicine is for infusion into a vein. It is administered in a hospital or clinic by a doctor or health care professional. Talk to your pediatrician regarding the use of this medicine in children. Special care may be needed. Overdosage: If you think you have taken too much of this medicine contact a poison control center or emergency room at once. NOTE: This medicine is only for you. Do not share this medicine with others. What if I miss a dose? It is important not to miss your dose. Call your doctor or health care professional if you are unable to keep an appointment. What may interact with this medicine? -vaccines Talk to your doctor or health care professional before taking any of these medicines: -aspirin -acetaminophen -ibuprofen -ketoprofen -naproxen This list may not describe all possible interactions. Give your health care provider a list of all the medicines, herbs, non-prescription drugs, or dietary supplements you use. Also tell them if you smoke, drink alcohol, or use illegal drugs. Some items may interact with your medicine. What should I watch for while using this medicine? Visit your doctor for  checks on your progress. This drug may make you feel generally unwell. This is not uncommon, as chemotherapy can affect healthy cells as well as cancer cells. Report any side effects. Continue your course of treatment even though you feel ill unless your doctor tells you to stop. In some cases, you may be given additional medicines to help with side effects. Follow all directions for their use. Call your doctor or health care professional for advice if you get a fever, chills or sore throat, or other symptoms of a cold or flu. Do not treat yourself. This drug decreases your body's ability to fight infections. Try to avoid being around people who are sick. This medicine may increase your risk to bruise or bleed. Call your doctor or health care professional if you notice any unusual bleeding. Do not become pregnant while taking this medicine or for at least 1 month after stopping it. Women should inform their doctor if they wish to become pregnant or think they might be pregnant. Men should not father a child while taking this medicine and for at least 2 months after stopping it. There is a potential for serious side effects to an unborn child. Talk to your health care professional or pharmacist for more information. Do not breast-feed an infant while taking this medicine. What side effects may I notice from receiving this medicine? Side effects that you should report to your doctor or health care professional as soon as possible: -low blood counts - this medicine may decrease the number of white blood cells, red blood cells and platelets. You may be at increased risk for infections and bleeding. -signs of infection - fever or   chills, cough, sore throat, pain or difficulty passing urine -signs of decreased platelets or bleeding - bruising, pinpoint red spots on the skin, black, tarry stools, blood in the urine -signs of decreased red blood cells - unusual weakness or tiredness, fainting spells,  lightheadedness -increased blood sugar Side effects that usually do not require medical attention (report to your doctor or health care professional if they continue or are bothersome): -constipation -diarrhea -headache -loss of appetite -nausea, vomiting -skin rash, itching -stomach pain -water retention -weak or tired This list may not describe all possible side effects. Call your doctor for medical advice about side effects. You may report side effects to FDA at 1-800-FDA-1088. Where should I keep my medicine? This drug is given in a hospital or clinic and will not be stored at home. NOTE: This sheet is a summary. It may not cover all possible information. If you have questions about this medicine, talk to your doctor, pharmacist, or health care provider.  2018 Elsevier/Gold Standard (2015-06-30 15:52:57)  

## 2017-02-01 ENCOUNTER — Ambulatory Visit (HOSPITAL_BASED_OUTPATIENT_CLINIC_OR_DEPARTMENT_OTHER): Payer: Medicare Other

## 2017-02-01 VITALS — BP 148/66 | HR 86 | Temp 97.6°F | Resp 20

## 2017-02-01 DIAGNOSIS — D4620 Refractory anemia with excess of blasts, unspecified: Secondary | ICD-10-CM

## 2017-02-01 DIAGNOSIS — Z5111 Encounter for antineoplastic chemotherapy: Secondary | ICD-10-CM

## 2017-02-01 DIAGNOSIS — D4621 Refractory anemia with excess of blasts 1: Secondary | ICD-10-CM

## 2017-02-01 MED ORDER — SODIUM CHLORIDE 0.9 % IV SOLN
20.0000 mg/m2 | Freq: Once | INTRAVENOUS | Status: AC
  Administered 2017-02-01: 45 mg via INTRAVENOUS
  Filled 2017-02-01: qty 9

## 2017-02-01 MED ORDER — PROCHLORPERAZINE MALEATE 10 MG PO TABS
10.0000 mg | ORAL_TABLET | Freq: Once | ORAL | Status: AC
Start: 2017-02-01 — End: 2017-02-01
  Administered 2017-02-01: 10 mg via ORAL

## 2017-02-01 MED ORDER — PROCHLORPERAZINE MALEATE 10 MG PO TABS
ORAL_TABLET | ORAL | Status: AC
  Filled 2017-02-01: qty 1

## 2017-02-01 MED ORDER — HEPARIN SOD (PORK) LOCK FLUSH 100 UNIT/ML IV SOLN
500.0000 [IU] | Freq: Once | INTRAVENOUS | Status: AC | PRN
Start: 2017-02-01 — End: 2017-02-01
  Administered 2017-02-01: 500 [IU]
  Filled 2017-02-01: qty 5

## 2017-02-01 MED ORDER — SODIUM CHLORIDE 0.9 % IV SOLN
Freq: Once | INTRAVENOUS | Status: AC
  Administered 2017-02-01: 10:00:00 via INTRAVENOUS

## 2017-02-01 MED ORDER — SODIUM CHLORIDE 0.9% FLUSH
10.0000 mL | INTRAVENOUS | Status: DC | PRN
  Administered 2017-02-01: 10 mL
  Filled 2017-02-01: qty 10

## 2017-02-01 NOTE — Patient Instructions (Signed)
[facility] Discharge Instructions for Patients Receiving Chemotherapy  Today you received the following chemotherapy agents:  Dacogen  To help prevent nausea and vomiting after your treatment, we encourage you to take your nausea medication as ordered per MD.    If you develop nausea and vomiting that is not controlled by your nausea medication, call the clinic.   BELOW ARE SYMPTOMS THAT SHOULD BE REPORTED IMMEDIATELY:  *FEVER GREATER THAN 100.5 F  *CHILLS WITH OR WITHOUT FEVER  NAUSEA AND VOMITING THAT IS NOT CONTROLLED WITH YOUR NAUSEA MEDICATION  *UNUSUAL SHORTNESS OF BREATH  *UNUSUAL BRUISING OR BLEEDING  TENDERNESS IN MOUTH AND THROAT WITH OR WITHOUT PRESENCE OF ULCERS  *URINARY PROBLEMS  *BOWEL PROBLEMS  UNUSUAL RASH Items with * indicate a potential emergency and should be followed up as soon as possible.  Feel free to call the clinic you have any questions or concerns. The clinic phone number is [phone].  Please show the [place] at check-in to the Emergency Department and triage nurse.

## 2017-02-08 ENCOUNTER — Ambulatory Visit: Payer: Medicare Other | Admitting: Interventional Cardiology

## 2017-03-18 ENCOUNTER — Ambulatory Visit: Payer: Medicare Other

## 2017-03-18 ENCOUNTER — Other Ambulatory Visit (HOSPITAL_BASED_OUTPATIENT_CLINIC_OR_DEPARTMENT_OTHER): Payer: Medicare Other

## 2017-03-18 ENCOUNTER — Ambulatory Visit (HOSPITAL_BASED_OUTPATIENT_CLINIC_OR_DEPARTMENT_OTHER): Payer: Medicare Other | Admitting: Hematology & Oncology

## 2017-03-18 ENCOUNTER — Ambulatory Visit (HOSPITAL_BASED_OUTPATIENT_CLINIC_OR_DEPARTMENT_OTHER): Payer: Medicare Other

## 2017-03-18 VITALS — BP 116/59 | HR 89 | Temp 97.9°F | Wt 218.1 lb

## 2017-03-18 DIAGNOSIS — J438 Other emphysema: Secondary | ICD-10-CM

## 2017-03-18 DIAGNOSIS — Z23 Encounter for immunization: Secondary | ICD-10-CM | POA: Diagnosis not present

## 2017-03-18 DIAGNOSIS — D4620 Refractory anemia with excess of blasts, unspecified: Secondary | ICD-10-CM

## 2017-03-18 DIAGNOSIS — D500 Iron deficiency anemia secondary to blood loss (chronic): Secondary | ICD-10-CM

## 2017-03-18 DIAGNOSIS — D4621 Refractory anemia with excess of blasts 1: Secondary | ICD-10-CM

## 2017-03-18 LAB — CBC WITH DIFFERENTIAL (CANCER CENTER ONLY)
BASO#: 0.1 10*3/uL (ref 0.0–0.2)
BASO%: 0.6 % (ref 0.0–2.0)
EOS%: 2.7 % (ref 0.0–7.0)
Eosinophils Absolute: 0.2 10*3/uL (ref 0.0–0.5)
HEMATOCRIT: 36.6 % — AB (ref 38.7–49.9)
HEMOGLOBIN: 11.9 g/dL — AB (ref 13.0–17.1)
LYMPH#: 1 10*3/uL (ref 0.9–3.3)
LYMPH%: 11.6 % — ABNORMAL LOW (ref 14.0–48.0)
MCH: 34.7 pg — ABNORMAL HIGH (ref 28.0–33.4)
MCHC: 32.5 g/dL (ref 32.0–35.9)
MCV: 107 fL — ABNORMAL HIGH (ref 82–98)
MONO#: 1 10*3/uL — ABNORMAL HIGH (ref 0.1–0.9)
MONO%: 11 % (ref 0.0–13.0)
NEUT%: 74.1 % (ref 40.0–80.0)
NEUTROS ABS: 6.5 10*3/uL (ref 1.5–6.5)
Platelets: 213 10*3/uL (ref 145–400)
RBC: 3.43 10*6/uL — AB (ref 4.20–5.70)
RDW: 15.6 % (ref 11.1–15.7)
WBC: 8.7 10*3/uL (ref 4.0–10.0)

## 2017-03-18 LAB — CMP (CANCER CENTER ONLY)
ALBUMIN: 3 g/dL — AB (ref 3.3–5.5)
ALK PHOS: 37 U/L (ref 26–84)
ALT: 23 U/L (ref 10–47)
AST: 23 U/L (ref 11–38)
BILIRUBIN TOTAL: 0.6 mg/dL (ref 0.20–1.60)
BUN, Bld: 21 mg/dL (ref 7–22)
CALCIUM: 8.6 mg/dL (ref 8.0–10.3)
CO2: 27 mEq/L (ref 18–33)
CREATININE: 1.5 mg/dL — AB (ref 0.6–1.2)
Chloride: 106 mEq/L (ref 98–108)
Glucose, Bld: 113 mg/dL (ref 73–118)
Potassium: 3.7 mEq/L (ref 3.3–4.7)
SODIUM: 142 meq/L (ref 128–145)
Total Protein: 6.3 g/dL — ABNORMAL LOW (ref 6.4–8.1)

## 2017-03-18 LAB — IRON AND TIBC
%SAT: 41 % (ref 20–55)
IRON: 115 ug/dL (ref 42–163)
TIBC: 280 ug/dL (ref 202–409)
UIBC: 165 ug/dL (ref 117–376)

## 2017-03-18 LAB — LACTATE DEHYDROGENASE: LDH: 241 U/L (ref 125–245)

## 2017-03-18 LAB — FERRITIN

## 2017-03-18 LAB — TECHNOLOGIST REVIEW CHCC SATELLITE

## 2017-03-18 MED ORDER — PROCHLORPERAZINE MALEATE 10 MG PO TABS
10.0000 mg | ORAL_TABLET | Freq: Once | ORAL | Status: AC
  Administered 2017-03-18: 10 mg via ORAL

## 2017-03-18 MED ORDER — SODIUM CHLORIDE 0.9 % IV SOLN
Freq: Once | INTRAVENOUS | Status: AC
  Administered 2017-03-18: 09:00:00 via INTRAVENOUS

## 2017-03-18 MED ORDER — SODIUM CHLORIDE 0.9 % IV SOLN
20.0000 mg/m2 | Freq: Once | INTRAVENOUS | Status: AC
  Administered 2017-03-18: 45 mg via INTRAVENOUS
  Filled 2017-03-18: qty 9

## 2017-03-18 MED ORDER — IPRATROPIUM-ALBUTEROL 0.5-2.5 (3) MG/3ML IN SOLN
RESPIRATORY_TRACT | Status: AC
  Filled 2017-03-18: qty 3

## 2017-03-18 MED ORDER — IPRATROPIUM-ALBUTEROL 0.5-2.5 (3) MG/3ML IN SOLN
3.0000 mL | Freq: Once | RESPIRATORY_TRACT | Status: AC
  Administered 2017-03-18: 3 mL via RESPIRATORY_TRACT

## 2017-03-18 MED ORDER — SODIUM CHLORIDE 0.9% FLUSH
10.0000 mL | INTRAVENOUS | Status: DC | PRN
  Administered 2017-03-18: 10 mL
  Filled 2017-03-18: qty 10

## 2017-03-18 MED ORDER — INFLUENZA VAC SPLIT QUAD 0.5 ML IM SUSY
0.5000 mL | PREFILLED_SYRINGE | Freq: Once | INTRAMUSCULAR | Status: AC
  Administered 2017-03-18: 0.5 mL via INTRAMUSCULAR

## 2017-03-18 MED ORDER — INFLUENZA VAC SPLIT QUAD 0.5 ML IM SUSY
PREFILLED_SYRINGE | INTRAMUSCULAR | Status: AC
  Filled 2017-03-18: qty 0.5

## 2017-03-18 MED ORDER — PROCHLORPERAZINE MALEATE 10 MG PO TABS
ORAL_TABLET | ORAL | Status: AC
  Filled 2017-03-18: qty 1

## 2017-03-18 MED ORDER — HEPARIN SOD (PORK) LOCK FLUSH 100 UNIT/ML IV SOLN
500.0000 [IU] | Freq: Once | INTRAVENOUS | Status: AC | PRN
  Administered 2017-03-18: 500 [IU]
  Filled 2017-03-18: qty 5

## 2017-03-18 NOTE — Progress Notes (Signed)
Hematology and Oncology Follow Up Visit  Anthony [name] [id number] 1940-09-06 76 y.o. 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 He is having a bad day. He says he just woke up not feeling well. He is wheezing a little bit. He does have underlying COPD. He is on inhalers at home.  We have not had to transfuse him for over a year.  He's had no fever. He's had no nausea or vomiting. There has been no change in bowel or bladder habits. There is been no cough. He has had no hemoptysis.  We have been on every 6 week cycles now. He has done incredibly well.  Overall, his performance status is ECOG 1.    Medications:  Allergies as of 03/18/2017   No Known Allergies     Medication List       Accurate as of 03/18/17  8:57 AM. Always use your most recent med list.          bimatoprost 0.01 % Soln Commonly known as:  LUMIGAN Place 1 drop into both eyes at bedtime.   budesonide-formoterol 160-4.5 MCG/ACT inhaler Commonly known as:  SYMBICORT Inhale 2 puffs into the lungs 2 (two) times daily.   buPROPion 150 MG 24 hr tablet Commonly known as:  WELLBUTRIN XL Take 150 mg by mouth daily.   clopidogrel 75 MG tablet Commonly known as:  PLAVIX Take 75 mg by mouth daily.   co-enzyme Q-10 30 MG capsule Take 100 mg by mouth daily.   Cyanocobalamin 2500 MCG Tabs Take 5,000 mcg by mouth daily.   fish oil-omega-3 fatty acids 1000 MG capsule Take 1 g by mouth daily.   glucosamine-chondroitin 500-400 MG tablet Take 1 tablet by mouth daily.   LORazepam 0.5 MG tablet Commonly known as:  ATIVAN Take 1 tablet (0.5 mg total) by mouth every 6  (six) hours as needed (Nausea or vomiting).   magnesium oxide 400 MG tablet Commonly known as:  MAG-OX Take 400 mg by mouth daily.   orphenadrine 100 MG tablet Commonly known as:  NORFLEX Take 1 tablet (100 mg total) by mouth 2 (two) times daily as needed for muscle spasms.   pantoprazole 40 MG tablet Commonly known as:  PROTONIX Take 40 mg by mouth daily.   predniSONE 20 MG tablet Commonly known as:  DELTASONE Take 1 tablet (20 mg total) by mouth daily with breakfast.   PROAIR RESPICLICK 623 (90 Base) MCG/ACT Aepb Generic drug:  Albuterol Sulfate Inhale 2 puffs into the lungs every 6 (six) hours as needed.   prochlorperazine 10 MG tablet Commonly known as:  COMPAZINE Take 1 tablet (10 mg total) by mouth every 6 (six) hours as needed (Nausea or vomiting).   ranitidine 300 MG tablet Commonly known as:  ZANTAC Take 300 mg by mouth at bedtime.   sertraline 100 MG tablet Commonly known as:  ZOLOFT Take 100 mg by mouth daily.   SIMBRINZA 1-0.2 % Susp Generic drug:  Brinzolamide-Brimonidine   simvastatin 40 MG tablet Commonly known as:  ZOCOR Take 40 mg by mouth every other day.   tamsulosin 0.4 MG Caps  capsule Commonly known as:  FLOMAX   tiotropium 18 MCG inhalation capsule Commonly known as:  SPIRIVA Place 18 mcg into inhaler and inhale daily.   triamcinolone cream 0.1 % Commonly known as:  KENALOG APPLY TO RASH/ITCH ON ARMS TWICE DAILY AS NEEDED   Vitamin D3 5000 units Tabs Take by mouth daily.       Allergies: No Known Allergies  Past Medical History, Surgical history, Social history, and Family History were reviewed and updated.  Review of Systems: As stated in the interim history  Physical Exam:  weight is 218 lb 1.6 oz (98.9 kg). His oral temperature is 97.9 F (36.6 C). His blood pressure is 116/59 (abnormal) and his pulse is 89. His oxygen saturation is 97%.   Wt Readings from Last 3 Encounters:  03/18/17 218 lb 1.6 oz (98.9 kg)  01/28/17 217  lb (98.4 kg)  12/17/16 215 lb (97.5 kg)    Well-developed and well-nourished white male. Head and neck exam shows no ocular or oral lesions. There are no palpable cervical or supraclavicular lymph nodes. Lungs are with good breath sounds bilaterally. He does have some wheezing bilaterally. Cardiac exam regular rate and rhythm with no murmurs, rubs or bruits. Abdomen is soft. He is mildly obese. He has good bowel sounds. There is no fluid wave. There is no palpable liver or spleen tip. Back exam shows no tenderness over the spine, ribs or hips. Extremities shows no clubbing, cyanosis or edema. Skin exam shows some scattered ecchymoses. Neurological exam shows no focal neurological deficits.  Lab Results  Component Value Date   WBC 8.7 03/18/2017   HGB 11.9 (L) 03/18/2017   HCT 36.6 (L) 03/18/2017   MCV 107 (H) 03/18/2017   PLT 213 03/18/2017   Lab Results  Component Value Date   FERRITIN 1,939 (H) 01/28/2017   IRON 97 01/28/2017   TIBC 274 01/28/2017   UIBC 177 01/28/2017   IRONPCTSAT 35 01/28/2017   Lab Results  Component Value Date   RETICCTPCT 1.7 05/30/2015   RBC 3.43 (L) 03/18/2017   RETICCTABS 39.4 05/30/2015   Lab Results  Component Value Date   KAPLAMBRATIO 1.11 12/17/2016   Lab Results  Component Value Date   IGGSERUM 735 12/17/2016   IGMSERUM 79 12/17/2016   Lab Results  Component Value Date   TOTALPROTELP 7.3 01/01/2014   ALBUMINELP 55.1 (L) 01/01/2014   A1GS 6.9 (H) 01/01/2014   A2GS 8.9 01/01/2014   BETS 7.9 (H) 01/01/2014   BETA2SER 6.7 (H) 01/01/2014   GAMS 14.5 01/01/2014   MSPIKE Not Observed 12/17/2016   SPEI * 01/01/2014     Chemistry      Component Value Date/Time   NA 141 01/28/2017 0834   NA 140 04/16/2016 0930   K 3.8 01/28/2017 0834   K 3.9 04/16/2016 0930   CL 104 01/28/2017 0834   CO2 29 01/28/2017 0834   CO2 24 04/16/2016 0930   BUN 18 01/28/2017 0834   BUN 20.5 04/16/2016 0930   CREATININE 1.6 (H) 01/28/2017 0834   CREATININE  1.2 04/16/2016 0930      Component Value Date/Time   CALCIUM 8.5 01/28/2017 0834   CALCIUM 8.9 04/16/2016 0930   ALKPHOS 43 01/28/2017 0834   ALKPHOS 54 04/16/2016 0930   AST 26 01/28/2017 0834   AST 16 04/16/2016 0930   ALT 22 01/28/2017 0834   ALT 21 04/16/2016 0930   BILITOT 0.60 01/28/2017 0834   BILITOT 0.27 04/16/2016 0930  Impression and Plan: Anthony [name] is 76 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   At this point, we will go ahead with treatment. I will check his iron studies. It is possible that the iron studies might be low activity causing some of the fatigue.  We will give him a nebulizer today. He has some wheezing on exam.  We will see him back right after Thanksgiving. I'm glad that he will have the week off of Thanksgiving to enjoy the holiday.   Volanda Napoleon, MD 10/8/20188:57 AM

## 2017-03-18 NOTE — Patient Instructions (Signed)
[facility] Discharge Instructions for Patients Receiving Chemotherapy  Today you received the following chemotherapy agents Dacogen.  To help prevent nausea and vomiting after your treatment, we encourage you to take your nausea medication per your MD instructions.   If you develop nausea and vomiting that is not controlled by your nausea medication, call the clinic.   BELOW ARE SYMPTOMS THAT SHOULD BE REPORTED IMMEDIATELY:  *FEVER GREATER THAN 100.5 F  *CHILLS WITH OR WITHOUT FEVER  NAUSEA AND VOMITING THAT IS NOT CONTROLLED WITH YOUR NAUSEA MEDICATION  *UNUSUAL SHORTNESS OF BREATH  *UNUSUAL BRUISING OR BLEEDING  TENDERNESS IN MOUTH AND THROAT WITH OR WITHOUT PRESENCE OF ULCERS  *URINARY PROBLEMS  *BOWEL PROBLEMS  UNUSUAL RASH Items with * indicate a potential emergency and should be followed up as soon as possible.  Feel free to call the clinic should you have any questions or concerns. The clinic phone number is [phone].  Please show the [place] at check-in to the Emergency Department and triage nurse.

## 2017-03-19 ENCOUNTER — Ambulatory Visit (HOSPITAL_BASED_OUTPATIENT_CLINIC_OR_DEPARTMENT_OTHER): Payer: Medicare Other

## 2017-03-19 DIAGNOSIS — D4621 Refractory anemia with excess of blasts 1: Secondary | ICD-10-CM

## 2017-03-19 DIAGNOSIS — D4620 Refractory anemia with excess of blasts, unspecified: Secondary | ICD-10-CM

## 2017-03-19 MED ORDER — SODIUM CHLORIDE 0.9 % IV SOLN
20.0000 mg/m2 | Freq: Once | INTRAVENOUS | Status: AC
  Administered 2017-03-19: 45 mg via INTRAVENOUS
  Filled 2017-03-19: qty 9

## 2017-03-19 MED ORDER — PROCHLORPERAZINE MALEATE 10 MG PO TABS
ORAL_TABLET | ORAL | Status: AC
Start: 2017-03-19 — End: 2017-03-19
  Filled 2017-03-19: qty 1

## 2017-03-19 MED ORDER — HEPARIN SOD (PORK) LOCK FLUSH 100 UNIT/ML IV SOLN
500.0000 [IU] | Freq: Once | INTRAVENOUS | Status: AC | PRN
  Administered 2017-03-19: 500 [IU]
  Filled 2017-03-19: qty 5

## 2017-03-19 MED ORDER — SODIUM CHLORIDE 0.9% FLUSH
10.0000 mL | INTRAVENOUS | Status: DC | PRN
  Administered 2017-03-19: 10 mL
  Filled 2017-03-19: qty 10

## 2017-03-19 MED ORDER — PROCHLORPERAZINE MALEATE 10 MG PO TABS
10.0000 mg | ORAL_TABLET | Freq: Once | ORAL | Status: AC
  Administered 2017-03-19: 10 mg via ORAL

## 2017-03-19 MED ORDER — SODIUM CHLORIDE 0.9 % IV SOLN
Freq: Once | INTRAVENOUS | Status: AC
  Administered 2017-03-19: 10:00:00 via INTRAVENOUS

## 2017-03-19 MED ORDER — PROCHLORPERAZINE MALEATE 10 MG PO TABS
ORAL_TABLET | ORAL | Status: AC
  Filled 2017-03-19: qty 1

## 2017-03-19 NOTE — Patient Instructions (Signed)
Decitabine injection for infusion What is this medicine? DECITABINE (dee SYE ta been) is a chemotherapy drug. This medicine reduces the growth of cancer cells. It is used to treat adults with myelodysplastic syndromes. This medicine may be used for other purposes; ask your health care provider or pharmacist if you have questions. COMMON BRAND NAME(S): Dacogen What should I tell my health care provider before I take this medicine? They need to know if you have any of these conditions: -infection (especially a virus infection such as chickenpox, cold sores, or herpes) -kidney disease -liver disease -an unusual or allergic reaction to decitabine, other medicines, foods, dyes, or preservatives -pregnant or trying to get pregnant -breast-feeding How should I use this medicine? This medicine is for infusion into a vein. It is administered in a hospital or clinic by a doctor or health care professional. Talk to your pediatrician regarding the use of this medicine in children. Special care may be needed. Overdosage: If you think you have taken too much of this medicine contact a poison control center or emergency room at once. NOTE: This medicine is only for you. Do not share this medicine with others. What if I miss a dose? It is important not to miss your dose. Call your doctor or health care professional if you are unable to keep an appointment. What may interact with this medicine? -vaccines Talk to your doctor or health care professional before taking any of these medicines: -aspirin -acetaminophen -ibuprofen -ketoprofen -naproxen This list may not describe all possible interactions. Give your health care provider a list of all the medicines, herbs, non-prescription drugs, or dietary supplements you use. Also tell them if you smoke, drink alcohol, or use illegal drugs. Some items may interact with your medicine. What should I watch for while using this medicine? Visit your doctor for  checks on your progress. This drug may make you feel generally unwell. This is not uncommon, as chemotherapy can affect healthy cells as well as cancer cells. Report any side effects. Continue your course of treatment even though you feel ill unless your doctor tells you to stop. In some cases, you may be given additional medicines to help with side effects. Follow all directions for their use. Call your doctor or health care professional for advice if you get a fever, chills or sore throat, or other symptoms of a cold or flu. Do not treat yourself. This drug decreases your body's ability to fight infections. Try to avoid being around people who are sick. This medicine may increase your risk to bruise or bleed. Call your doctor or health care professional if you notice any unusual bleeding. Do not become pregnant while taking this medicine or for at least 1 month after stopping it. Women should inform their doctor if they wish to become pregnant or think they might be pregnant. Men should not father a child while taking this medicine and for at least 2 months after stopping it. There is a potential for serious side effects to an unborn child. Talk to your health care professional or pharmacist for more information. Do not breast-feed an infant while taking this medicine. What side effects may I notice from receiving this medicine? Side effects that you should report to your doctor or health care professional as soon as possible: -low blood counts - this medicine may decrease the number of white blood cells, red blood cells and platelets. You may be at increased risk for infections and bleeding. -signs of infection - fever or   chills, cough, sore throat, pain or difficulty passing urine -signs of decreased platelets or bleeding - bruising, pinpoint red spots on the skin, black, tarry stools, blood in the urine -signs of decreased red blood cells - unusual weakness or tiredness, fainting spells,  lightheadedness -increased blood sugar Side effects that usually do not require medical attention (report to your doctor or health care professional if they continue or are bothersome): -constipation -diarrhea -headache -loss of appetite -nausea, vomiting -skin rash, itching -stomach pain -water retention -weak or tired This list may not describe all possible side effects. Call your doctor for medical advice about side effects. You may report side effects to FDA at 1-800-FDA-1088. Where should I keep my medicine? This drug is given in a hospital or clinic and will not be stored at home. NOTE: This sheet is a summary. It may not cover all possible information. If you have questions about this medicine, talk to your doctor, pharmacist, or health care provider.  2018 Elsevier/Gold Standard (2015-06-30 15:52:57)  

## 2017-03-20 ENCOUNTER — Ambulatory Visit (HOSPITAL_BASED_OUTPATIENT_CLINIC_OR_DEPARTMENT_OTHER): Payer: Medicare Other

## 2017-03-20 VITALS — BP 152/63 | HR 85 | Temp 97.8°F | Resp 20

## 2017-03-20 DIAGNOSIS — D4620 Refractory anemia with excess of blasts, unspecified: Secondary | ICD-10-CM

## 2017-03-20 DIAGNOSIS — D4621 Refractory anemia with excess of blasts 1: Secondary | ICD-10-CM

## 2017-03-20 MED ORDER — HEPARIN SOD (PORK) LOCK FLUSH 100 UNIT/ML IV SOLN
500.0000 [IU] | Freq: Once | INTRAVENOUS | Status: AC | PRN
  Administered 2017-03-20: 500 [IU]
  Filled 2017-03-20: qty 5

## 2017-03-20 MED ORDER — SODIUM CHLORIDE 0.9 % IV SOLN
Freq: Once | INTRAVENOUS | Status: AC
  Administered 2017-03-20: 10:00:00 via INTRAVENOUS

## 2017-03-20 MED ORDER — PROCHLORPERAZINE MALEATE 10 MG PO TABS
10.0000 mg | ORAL_TABLET | Freq: Once | ORAL | Status: AC
  Administered 2017-03-20: 10 mg via ORAL

## 2017-03-20 MED ORDER — SODIUM CHLORIDE 0.9 % IV SOLN
20.0000 mg/m2 | Freq: Once | INTRAVENOUS | Status: AC
  Administered 2017-03-20: 45 mg via INTRAVENOUS
  Filled 2017-03-20: qty 9

## 2017-03-20 MED ORDER — PROCHLORPERAZINE MALEATE 10 MG PO TABS
ORAL_TABLET | ORAL | Status: AC
  Filled 2017-03-20: qty 1

## 2017-03-20 MED ORDER — SODIUM CHLORIDE 0.9% FLUSH
10.0000 mL | INTRAVENOUS | Status: DC | PRN
  Administered 2017-03-20: 10 mL
  Filled 2017-03-20: qty 10

## 2017-03-20 NOTE — Patient Instructions (Signed)
Decitabine injection for infusion What is this medicine? DECITABINE (dee SYE ta been) is a chemotherapy drug. This medicine reduces the growth of cancer cells. It is used to treat adults with myelodysplastic syndromes. This medicine may be used for other purposes; ask your health care provider or pharmacist if you have questions. COMMON BRAND NAME(S): Dacogen What should I tell my health care provider before I take this medicine? They need to know if you have any of these conditions: -infection (especially a virus infection such as chickenpox, cold sores, or herpes) -kidney disease -liver disease -an unusual or allergic reaction to decitabine, other medicines, foods, dyes, or preservatives -pregnant or trying to get pregnant -breast-feeding How should I use this medicine? This medicine is for infusion into a vein. It is administered in a hospital or clinic by a doctor or health care professional. Talk to your pediatrician regarding the use of this medicine in children. Special care may be needed. Overdosage: If you think you have taken too much of this medicine contact a poison control center or emergency room at once. NOTE: This medicine is only for you. Do not share this medicine with others. What if I miss a dose? It is important not to miss your dose. Call your doctor or health care professional if you are unable to keep an appointment. What may interact with this medicine? -vaccines Talk to your doctor or health care professional before taking any of these medicines: -aspirin -acetaminophen -ibuprofen -ketoprofen -naproxen This list may not describe all possible interactions. Give your health care provider a list of all the medicines, herbs, non-prescription drugs, or dietary supplements you use. Also tell them if you smoke, drink alcohol, or use illegal drugs. Some items may interact with your medicine. What should I watch for while using this medicine? Visit your doctor for  checks on your progress. This drug may make you feel generally unwell. This is not uncommon, as chemotherapy can affect healthy cells as well as cancer cells. Report any side effects. Continue your course of treatment even though you feel ill unless your doctor tells you to stop. In some cases, you may be given additional medicines to help with side effects. Follow all directions for their use. Call your doctor or health care professional for advice if you get a fever, chills or sore throat, or other symptoms of a cold or flu. Do not treat yourself. This drug decreases your body's ability to fight infections. Try to avoid being around people who are sick. This medicine may increase your risk to bruise or bleed. Call your doctor or health care professional if you notice any unusual bleeding. Do not become pregnant while taking this medicine or for at least 1 month after stopping it. Women should inform their doctor if they wish to become pregnant or think they might be pregnant. Men should not father a child while taking this medicine and for at least 2 months after stopping it. There is a potential for serious side effects to an unborn child. Talk to your health care professional or pharmacist for more information. Do not breast-feed an infant while taking this medicine. What side effects may I notice from receiving this medicine? Side effects that you should report to your doctor or health care professional as soon as possible: -low blood counts - this medicine may decrease the number of white blood cells, red blood cells and platelets. You may be at increased risk for infections and bleeding. -signs of infection - fever or   chills, cough, sore throat, pain or difficulty passing urine -signs of decreased platelets or bleeding - bruising, pinpoint red spots on the skin, black, tarry stools, blood in the urine -signs of decreased red blood cells - unusual weakness or tiredness, fainting spells,  lightheadedness -increased blood sugar Side effects that usually do not require medical attention (report to your doctor or health care professional if they continue or are bothersome): -constipation -diarrhea -headache -loss of appetite -nausea, vomiting -skin rash, itching -stomach pain -water retention -weak or tired This list may not describe all possible side effects. Call your doctor for medical advice about side effects. You may report side effects to FDA at 1-800-FDA-1088. Where should I keep my medicine? This drug is given in a hospital or clinic and will not be stored at home. NOTE: This sheet is a summary. It may not cover all possible information. If you have questions about this medicine, talk to your doctor, pharmacist, or health care provider.  2018 Elsevier/Gold Standard (2015-06-30 15:52:57)  

## 2017-03-21 ENCOUNTER — Ambulatory Visit (HOSPITAL_BASED_OUTPATIENT_CLINIC_OR_DEPARTMENT_OTHER): Payer: Medicare Other

## 2017-03-21 VITALS — BP 123/62 | HR 92 | Temp 98.1°F | Resp 18

## 2017-03-21 DIAGNOSIS — D4621 Refractory anemia with excess of blasts 1: Secondary | ICD-10-CM | POA: Diagnosis not present

## 2017-03-21 DIAGNOSIS — D4620 Refractory anemia with excess of blasts, unspecified: Secondary | ICD-10-CM

## 2017-03-21 MED ORDER — SODIUM CHLORIDE 0.9 % IV SOLN
Freq: Once | INTRAVENOUS | Status: AC
  Administered 2017-03-21: 10:00:00 via INTRAVENOUS

## 2017-03-21 MED ORDER — PROCHLORPERAZINE MALEATE 10 MG PO TABS
ORAL_TABLET | ORAL | Status: AC
  Filled 2017-03-21: qty 1

## 2017-03-21 MED ORDER — SODIUM CHLORIDE 0.9 % IV SOLN
20.0000 mg/m2 | Freq: Once | INTRAVENOUS | Status: AC
  Administered 2017-03-21: 45 mg via INTRAVENOUS
  Filled 2017-03-21: qty 9

## 2017-03-21 MED ORDER — HEPARIN SOD (PORK) LOCK FLUSH 100 UNIT/ML IV SOLN
500.0000 [IU] | Freq: Once | INTRAVENOUS | Status: AC | PRN
  Administered 2017-03-21: 500 [IU]
  Filled 2017-03-21: qty 5

## 2017-03-21 MED ORDER — SODIUM CHLORIDE 0.9% FLUSH
10.0000 mL | INTRAVENOUS | Status: DC | PRN
  Administered 2017-03-21: 10 mL
  Filled 2017-03-21: qty 10

## 2017-03-21 MED ORDER — PROCHLORPERAZINE MALEATE 10 MG PO TABS
10.0000 mg | ORAL_TABLET | Freq: Once | ORAL | Status: AC
  Administered 2017-03-21: 10 mg via ORAL

## 2017-03-21 NOTE — Patient Instructions (Signed)
Decitabine injection for infusion What is this medicine? DECITABINE (dee SYE ta been) is a chemotherapy drug. This medicine reduces the growth of cancer cells. It is used to treat adults with myelodysplastic syndromes. This medicine may be used for other purposes; ask your health care provider or pharmacist if you have questions. COMMON BRAND NAME(S): Dacogen What should I tell my health care provider before I take this medicine? They need to know if you have any of these conditions: -infection (especially a virus infection such as chickenpox, cold sores, or herpes) -kidney disease -liver disease -an unusual or allergic reaction to decitabine, other medicines, foods, dyes, or preservatives -pregnant or trying to get pregnant -breast-feeding How should I use this medicine? This medicine is for infusion into a vein. It is administered in a hospital or clinic by a doctor or health care professional. Talk to your pediatrician regarding the use of this medicine in children. Special care may be needed. Overdosage: If you think you have taken too much of this medicine contact a poison control center or emergency room at once. NOTE: This medicine is only for you. Do not share this medicine with others. What if I miss a dose? It is important not to miss your dose. Call your doctor or health care professional if you are unable to keep an appointment. What may interact with this medicine? -vaccines Talk to your doctor or health care professional before taking any of these medicines: -aspirin -acetaminophen -ibuprofen -ketoprofen -naproxen This list may not describe all possible interactions. Give your health care provider a list of all the medicines, herbs, non-prescription drugs, or dietary supplements you use. Also tell them if you smoke, drink alcohol, or use illegal drugs. Some items may interact with your medicine. What should I watch for while using this medicine? Visit your doctor for  checks on your progress. This drug may make you feel generally unwell. This is not uncommon, as chemotherapy can affect healthy cells as well as cancer cells. Report any side effects. Continue your course of treatment even though you feel ill unless your doctor tells you to stop. In some cases, you may be given additional medicines to help with side effects. Follow all directions for their use. Call your doctor or health care professional for advice if you get a fever, chills or sore throat, or other symptoms of a cold or flu. Do not treat yourself. This drug decreases your body's ability to fight infections. Try to avoid being around people who are sick. This medicine may increase your risk to bruise or bleed. Call your doctor or health care professional if you notice any unusual bleeding. Do not become pregnant while taking this medicine or for at least 1 month after stopping it. Women should inform their doctor if they wish to become pregnant or think they might be pregnant. Men should not father a child while taking this medicine and for at least 2 months after stopping it. There is a potential for serious side effects to an unborn child. Talk to your health care professional or pharmacist for more information. Do not breast-feed an infant while taking this medicine. What side effects may I notice from receiving this medicine? Side effects that you should report to your doctor or health care professional as soon as possible: -low blood counts - this medicine may decrease the number of white blood cells, red blood cells and platelets. You may be at increased risk for infections and bleeding. -signs of infection - fever or   chills, cough, sore throat, pain or difficulty passing urine -signs of decreased platelets or bleeding - bruising, pinpoint red spots on the skin, black, tarry stools, blood in the urine -signs of decreased red blood cells - unusual weakness or tiredness, fainting spells,  lightheadedness -increased blood sugar Side effects that usually do not require medical attention (report to your doctor or health care professional if they continue or are bothersome): -constipation -diarrhea -headache -loss of appetite -nausea, vomiting -skin rash, itching -stomach pain -water retention -weak or tired This list may not describe all possible side effects. Call your doctor for medical advice about side effects. You may report side effects to FDA at 1-800-FDA-1088. Where should I keep my medicine? This drug is given in a hospital or clinic and will not be stored at home. NOTE: This sheet is a summary. It may not cover all possible information. If you have questions about this medicine, talk to your doctor, pharmacist, or health care provider.  2018 Elsevier/Gold Standard (2015-06-30 15:52:57)  

## 2017-03-22 ENCOUNTER — Ambulatory Visit (HOSPITAL_BASED_OUTPATIENT_CLINIC_OR_DEPARTMENT_OTHER): Payer: Medicare Other

## 2017-03-22 VITALS — BP 150/67 | HR 92 | Temp 97.7°F | Resp 19

## 2017-03-22 DIAGNOSIS — D4620 Refractory anemia with excess of blasts, unspecified: Secondary | ICD-10-CM

## 2017-03-22 DIAGNOSIS — D4621 Refractory anemia with excess of blasts 1: Secondary | ICD-10-CM | POA: Diagnosis not present

## 2017-03-22 MED ORDER — SODIUM CHLORIDE 0.9 % IV SOLN
Freq: Once | INTRAVENOUS | Status: AC
  Administered 2017-03-22: 10:00:00 via INTRAVENOUS

## 2017-03-22 MED ORDER — SODIUM CHLORIDE 0.9% FLUSH
10.0000 mL | INTRAVENOUS | Status: DC | PRN
  Administered 2017-03-22: 10 mL
  Filled 2017-03-22: qty 10

## 2017-03-22 MED ORDER — PROCHLORPERAZINE MALEATE 10 MG PO TABS
ORAL_TABLET | ORAL | Status: AC
  Filled 2017-03-22: qty 1

## 2017-03-22 MED ORDER — SODIUM CHLORIDE 0.9 % IV SOLN
20.0000 mg/m2 | Freq: Once | INTRAVENOUS | Status: AC
  Administered 2017-03-22: 45 mg via INTRAVENOUS
  Filled 2017-03-22: qty 9

## 2017-03-22 MED ORDER — PROCHLORPERAZINE MALEATE 10 MG PO TABS
10.0000 mg | ORAL_TABLET | Freq: Once | ORAL | Status: AC
  Administered 2017-03-22: 10 mg via ORAL

## 2017-03-22 MED ORDER — HEPARIN SOD (PORK) LOCK FLUSH 100 UNIT/ML IV SOLN
500.0000 [IU] | Freq: Once | INTRAVENOUS | Status: AC | PRN
  Administered 2017-03-22: 500 [IU]
  Filled 2017-03-22: qty 5

## 2017-03-22 NOTE — Patient Instructions (Signed)
Decitabine injection for infusion What is this medicine? DECITABINE (dee SYE ta been) is a chemotherapy drug. This medicine reduces the growth of cancer cells. It is used to treat adults with myelodysplastic syndromes. This medicine may be used for other purposes; ask your health care provider or pharmacist if you have questions. COMMON BRAND NAME(S): Dacogen What should I tell my health care provider before I take this medicine? They need to know if you have any of these conditions: -infection (especially a virus infection such as chickenpox, cold sores, or herpes) -kidney disease -liver disease -an unusual or allergic reaction to decitabine, other medicines, foods, dyes, or preservatives -pregnant or trying to get pregnant -breast-feeding How should I use this medicine? This medicine is for infusion into a vein. It is administered in a hospital or clinic by a doctor or health care professional. Talk to your pediatrician regarding the use of this medicine in children. Special care may be needed. Overdosage: If you think you have taken too much of this medicine contact a poison control center or emergency room at once. NOTE: This medicine is only for you. Do not share this medicine with others. What if I miss a dose? It is important not to miss your dose. Call your doctor or health care professional if you are unable to keep an appointment. What may interact with this medicine? -vaccines Talk to your doctor or health care professional before taking any of these medicines: -aspirin -acetaminophen -ibuprofen -ketoprofen -naproxen This list may not describe all possible interactions. Give your health care provider a list of all the medicines, herbs, non-prescription drugs, or dietary supplements you use. Also tell them if you smoke, drink alcohol, or use illegal drugs. Some items may interact with your medicine. What should I watch for while using this medicine? Visit your doctor for  checks on your progress. This drug may make you feel generally unwell. This is not uncommon, as chemotherapy can affect healthy cells as well as cancer cells. Report any side effects. Continue your course of treatment even though you feel ill unless your doctor tells you to stop. In some cases, you may be given additional medicines to help with side effects. Follow all directions for their use. Call your doctor or health care professional for advice if you get a fever, chills or sore throat, or other symptoms of a cold or flu. Do not treat yourself. This drug decreases your body's ability to fight infections. Try to avoid being around people who are sick. This medicine may increase your risk to bruise or bleed. Call your doctor or health care professional if you notice any unusual bleeding. Do not become pregnant while taking this medicine or for at least 1 month after stopping it. Women should inform their doctor if they wish to become pregnant or think they might be pregnant. Men should not father a child while taking this medicine and for at least 2 months after stopping it. There is a potential for serious side effects to an unborn child. Talk to your health care professional or pharmacist for more information. Do not breast-feed an infant while taking this medicine. What side effects may I notice from receiving this medicine? Side effects that you should report to your doctor or health care professional as soon as possible: -low blood counts - this medicine may decrease the number of white blood cells, red blood cells and platelets. You may be at increased risk for infections and bleeding. -signs of infection - fever or   chills, cough, sore throat, pain or difficulty passing urine -signs of decreased platelets or bleeding - bruising, pinpoint red spots on the skin, black, tarry stools, blood in the urine -signs of decreased red blood cells - unusual weakness or tiredness, fainting spells,  lightheadedness -increased blood sugar Side effects that usually do not require medical attention (report to your doctor or health care professional if they continue or are bothersome): -constipation -diarrhea -headache -loss of appetite -nausea, vomiting -skin rash, itching -stomach pain -water retention -weak or tired This list may not describe all possible side effects. Call your doctor for medical advice about side effects. You may report side effects to FDA at 1-800-FDA-1088. Where should I keep my medicine? This drug is given in a hospital or clinic and will not be stored at home. NOTE: This sheet is a summary. It may not cover all possible information. If you have questions about this medicine, talk to your doctor, pharmacist, or health care provider.  2018 Elsevier/Gold Standard (2015-06-30 15:52:57)  

## 2017-05-06 ENCOUNTER — Other Ambulatory Visit: Payer: Self-pay

## 2017-05-06 ENCOUNTER — Ambulatory Visit: Payer: Medicare Other

## 2017-05-06 ENCOUNTER — Ambulatory Visit (HOSPITAL_BASED_OUTPATIENT_CLINIC_OR_DEPARTMENT_OTHER): Payer: Medicare Other | Admitting: Family

## 2017-05-06 ENCOUNTER — Other Ambulatory Visit: Payer: Self-pay | Admitting: Family

## 2017-05-06 ENCOUNTER — Encounter: Payer: Self-pay | Admitting: Family

## 2017-05-06 ENCOUNTER — Other Ambulatory Visit (HOSPITAL_BASED_OUTPATIENT_CLINIC_OR_DEPARTMENT_OTHER): Payer: Medicare Other

## 2017-05-06 ENCOUNTER — Ambulatory Visit (HOSPITAL_BASED_OUTPATIENT_CLINIC_OR_DEPARTMENT_OTHER): Payer: Medicare Other

## 2017-05-06 DIAGNOSIS — Z5111 Encounter for antineoplastic chemotherapy: Secondary | ICD-10-CM

## 2017-05-06 DIAGNOSIS — D4621 Refractory anemia with excess of blasts 1: Secondary | ICD-10-CM

## 2017-05-06 DIAGNOSIS — R110 Nausea: Secondary | ICD-10-CM

## 2017-05-06 DIAGNOSIS — D500 Iron deficiency anemia secondary to blood loss (chronic): Secondary | ICD-10-CM

## 2017-05-06 DIAGNOSIS — D4620 Refractory anemia with excess of blasts, unspecified: Secondary | ICD-10-CM

## 2017-05-06 DIAGNOSIS — J438 Other emphysema: Secondary | ICD-10-CM

## 2017-05-06 DIAGNOSIS — F419 Anxiety disorder, unspecified: Secondary | ICD-10-CM

## 2017-05-06 LAB — FERRITIN

## 2017-05-06 LAB — CBC WITH DIFFERENTIAL (CANCER CENTER ONLY)
BASO#: 0 10*3/uL (ref 0.0–0.2)
BASO%: 0.3 % (ref 0.0–2.0)
EOS%: 1.5 % (ref 0.0–7.0)
Eosinophils Absolute: 0.1 10*3/uL (ref 0.0–0.5)
HEMATOCRIT: 35.8 % — AB (ref 38.7–49.9)
HEMOGLOBIN: 11.5 g/dL — AB (ref 13.0–17.1)
LYMPH#: 1 10*3/uL (ref 0.9–3.3)
LYMPH%: 10.1 % — ABNORMAL LOW (ref 14.0–48.0)
MCH: 34.1 pg — ABNORMAL HIGH (ref 28.0–33.4)
MCHC: 32.1 g/dL (ref 32.0–35.9)
MCV: 106 fL — ABNORMAL HIGH (ref 82–98)
MONO#: 1.3 10*3/uL — ABNORMAL HIGH (ref 0.1–0.9)
MONO%: 13.7 % — ABNORMAL HIGH (ref 0.0–13.0)
NEUT%: 74.4 % (ref 40.0–80.0)
NEUTROS ABS: 7.1 10*3/uL — AB (ref 1.5–6.5)
Platelets: 213 10*3/uL (ref 145–400)
RBC: 3.37 10*6/uL — AB (ref 4.20–5.70)
RDW: 16.1 % — ABNORMAL HIGH (ref 11.1–15.7)
WBC: 9.6 10*3/uL (ref 4.0–10.0)

## 2017-05-06 LAB — IRON AND TIBC
%SAT: 34 % (ref 20–55)
IRON: 92 ug/dL (ref 42–163)
TIBC: 269 ug/dL (ref 202–409)
UIBC: 177 ug/dL (ref 117–376)

## 2017-05-06 LAB — CMP (CANCER CENTER ONLY)
ALK PHOS: 46 U/L (ref 26–84)
ALT: 25 U/L (ref 10–47)
AST: 21 U/L (ref 11–38)
Albumin: 3.2 g/dL — ABNORMAL LOW (ref 3.3–5.5)
BILIRUBIN TOTAL: 0.5 mg/dL (ref 0.20–1.60)
BUN, Bld: 27 mg/dL — ABNORMAL HIGH (ref 7–22)
CO2: 28 meq/L (ref 18–33)
Calcium: 8.8 mg/dL (ref 8.0–10.3)
Chloride: 105 mEq/L (ref 98–108)
Creat: 1.4 mg/dl — ABNORMAL HIGH (ref 0.6–1.2)
GLUCOSE: 113 mg/dL (ref 73–118)
POTASSIUM: 3.8 meq/L (ref 3.3–4.7)
Sodium: 141 mEq/L (ref 128–145)
Total Protein: 6.4 g/dL (ref 6.4–8.1)

## 2017-05-06 LAB — LACTATE DEHYDROGENASE: LDH: 248 U/L — ABNORMAL HIGH (ref 125–245)

## 2017-05-06 LAB — RETICULOCYTES: RETICULOCYTE COUNT: 1.9 % (ref 0.6–2.6)

## 2017-05-06 LAB — TECHNOLOGIST REVIEW CHCC SATELLITE

## 2017-05-06 MED ORDER — SODIUM CHLORIDE 0.9 % IV SOLN
Freq: Once | INTRAVENOUS | Status: AC
  Administered 2017-05-06: 09:00:00 via INTRAVENOUS

## 2017-05-06 MED ORDER — HEPARIN SOD (PORK) LOCK FLUSH 100 UNIT/ML IV SOLN
500.0000 [IU] | Freq: Once | INTRAVENOUS | Status: AC | PRN
  Administered 2017-05-06: 500 [IU]
  Filled 2017-05-06: qty 5

## 2017-05-06 MED ORDER — SODIUM CHLORIDE 0.9 % IV SOLN
20.0000 mg/m2 | Freq: Once | INTRAVENOUS | Status: AC
  Administered 2017-05-06: 45 mg via INTRAVENOUS
  Filled 2017-05-06: qty 9

## 2017-05-06 MED ORDER — SODIUM CHLORIDE 0.9% FLUSH
10.0000 mL | INTRAVENOUS | Status: DC | PRN
  Administered 2017-05-06: 10 mL
  Filled 2017-05-06: qty 10

## 2017-05-06 MED ORDER — PROCHLORPERAZINE MALEATE 10 MG PO TABS
ORAL_TABLET | ORAL | Status: AC
  Filled 2017-05-06: qty 1

## 2017-05-06 MED ORDER — PROCHLORPERAZINE MALEATE 10 MG PO TABS
10.0000 mg | ORAL_TABLET | Freq: Once | ORAL | Status: AC
  Administered 2017-05-06: 10 mg via ORAL

## 2017-05-06 MED ORDER — LORAZEPAM 0.5 MG PO TABS: 0.5000 mg | ORAL_TABLET | Freq: Four times a day (QID) | ORAL | 0 refills | Status: DC | PRN

## 2017-05-06 MED FILL — LORazepam 0.5 MG TABS: 0.5 | 23 days supply | Qty: 90 | Fill #0

## 2017-05-06 NOTE — Progress Notes (Signed)
Hematology and Oncology Follow Up Visit  Anthony [name] [id number] 1940/09/27 76 y.o. 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 He is feeling a little fatigued at times.  He has some nausea at times associated with GERD.  No fever, chills, cough, rash, dizziness, chest pain, palpitations, abdominal pain or changes in bowel or bladder habits.  He states that he has occasional diarrhea but that this has been mild and tolerable.  No episodes of bleeding. He does bruise easily on Plavix.  No lymphadenopathy.  No swelling or numbness in his extremities. He has chronic back pain due to arthritis. This is unchanged.  He has tingling in his toes that is unchanged.  He has had no recent falls or syncopal episodes. He uses a cane when ambulating for support.  He has maintained a good appetite and is staying well hydrated. His weight is stable.   ECOG Performance Status: 1 - Symptomatic but completely ambulatory  Medications:  Allergies as of 05/06/2017   No Known Allergies     Medication List        Accurate as of 05/06/17  9:21 AM. Always use your most recent med list.          bimatoprost 0.01 % Soln Commonly known as:  LUMIGAN Place 1 drop into both eyes at bedtime.   budesonide-formoterol 160-4.5 MCG/ACT inhaler Commonly known as:  SYMBICORT Inhale 2 puffs into the lungs 2 (two) times daily.   buPROPion 150 MG 24 hr tablet Commonly known as:  WELLBUTRIN XL Take 150 mg by mouth daily.   clopidogrel 75 MG tablet Commonly known as:  PLAVIX Take 75 mg by mouth daily.   co-enzyme Q-10 30 MG  capsule Take 100 mg by mouth daily.   Cyanocobalamin 2500 MCG Tabs Take 5,000 mcg by mouth daily.   fish oil-omega-3 fatty acids 1000 MG capsule Take 1 g by mouth daily.   glucosamine-chondroitin 500-400 MG tablet Take 1 tablet by mouth daily.   LORazepam 0.5 MG tablet Commonly known as:  ATIVAN Take 1 tablet (0.5 mg total) by mouth every 6 (six) hours as needed (Nausea or vomiting).   magnesium oxide 400 MG tablet Commonly known as:  MAG-OX Take 400 mg by mouth daily.   orphenadrine 100 MG tablet Commonly known as:  NORFLEX Take 1 tablet (100 mg total) by mouth 2 (two) times daily as needed for muscle spasms.   pantoprazole 40 MG tablet Commonly known as:  PROTONIX Take 40 mg by mouth daily.   predniSONE 20 MG tablet Commonly known as:  DELTASONE Take 1 tablet (20 mg total) by mouth daily with breakfast.   PROAIR RESPICLICK 086 (90 Base) MCG/ACT Aepb Generic drug:  Albuterol Sulfate Inhale 2 puffs into the lungs every 6 (six) hours as needed.   prochlorperazine 10 MG tablet Commonly known as:  COMPAZINE Take 1 tablet (10 mg total) by mouth every 6 (six) hours as needed (Nausea or vomiting).   ranitidine 300 MG tablet Commonly known as:  ZANTAC Take 300 mg by mouth at bedtime.   sertraline 100 MG tablet  Commonly known as:  ZOLOFT Take 100 mg by mouth daily.   SIMBRINZA 1-0.2 % Susp Generic drug:  Brinzolamide-Brimonidine   simvastatin 40 MG tablet Commonly known as:  ZOCOR Take 40 mg by mouth every other day.   tamsulosin 0.4 MG Caps capsule Commonly known as:  FLOMAX   tiotropium 18 MCG inhalation capsule Commonly known as:  SPIRIVA Place 18 mcg into inhaler and inhale daily.   triamcinolone cream 0.1 % Commonly known as:  KENALOG APPLY TO RASH/ITCH ON ARMS TWICE DAILY AS NEEDED   Vitamin D3 5000 units Tabs Take by mouth daily.       Allergies: No Known Allergies  Past Medical History, Surgical history, Social history, and Family History were  reviewed and updated.  Review of Systems: All other 10 point review of systems is negative.   Physical Exam:  vitals were not taken for this visit.   Wt Readings from Last 3 Encounters:  03/18/17 218 lb 1.6 oz (98.9 kg)  01/28/17 217 lb (98.4 kg)  12/17/16 215 lb (97.5 kg)    Ocular: Sclerae unicteric, pupils equal, round and reactive to light Ear-nose-throat: Oropharynx clear, dentition fair Lymphatic: No cervical, supraclavicular or axillary adenopathy Lungs no rales or rhonchi, good excursion bilaterally Heart regular rate and rhythm, no murmur appreciated Abd soft, nontender, positive bowel sounds, no liver or spleen tip palpated on exam, no fluid wave  MSK no focal spinal tenderness, no joint edema Neuro: non-focal, well-oriented, appropriate affect Breasts: Deferred   Lab Results  Component Value Date   WBC 9.6 05/06/2017   HGB 11.5 (L) 05/06/2017   HCT 35.8 (L) 05/06/2017   MCV 106 (H) 05/06/2017   PLT 213 05/06/2017   Lab Results  Component Value Date   FERRITIN 1,821 (H) 03/18/2017   IRON 115 03/18/2017   TIBC 280 03/18/2017   UIBC 165 03/18/2017   IRONPCTSAT 41 03/18/2017   Lab Results  Component Value Date   RETICCTPCT 1.7 05/30/2015   RBC 3.37 (L) 05/06/2017   RETICCTABS 39.4 05/30/2015   Lab Results  Component Value Date   KAPLAMBRATIO 1.11 12/17/2016   Lab Results  Component Value Date   IGGSERUM 735 12/17/2016   IGMSERUM 79 12/17/2016   Lab Results  Component Value Date   TOTALPROTELP 7.3 01/01/2014   ALBUMINELP 55.1 (L) 01/01/2014   A1GS 6.9 (H) 01/01/2014   A2GS 8.9 01/01/2014   BETS 7.9 (H) 01/01/2014   BETA2SER 6.7 (H) 01/01/2014   GAMS 14.5 01/01/2014   MSPIKE Not Observed 12/17/2016   SPEI * 01/01/2014     Chemistry      Component Value Date/Time   NA 142 03/18/2017 0805   NA 140 04/16/2016 0930   K 3.7 03/18/2017 0805   K 3.9 04/16/2016 0930   CL 106 03/18/2017 0805   CO2 27 03/18/2017 0805   CO2 24 04/16/2016 0930    BUN 21 03/18/2017 0805   BUN 20.5 04/16/2016 0930   CREATININE 1.5 (H) 03/18/2017 0805   CREATININE 1.2 04/16/2016 0930      Component Value Date/Time   CALCIUM 8.6 03/18/2017 0805   CALCIUM 8.9 04/16/2016 0930   ALKPHOS 37 03/18/2017 0805   ALKPHOS 54 04/16/2016 0930   AST 23 03/18/2017 0805   AST 16 04/16/2016 0930   ALT 23 03/18/2017 0805   ALT 21 04/16/2016 0930   BILITOT 0.60 03/18/2017 0805   BILITOT 0.27 04/16/2016 0930      Impression and Plan: Mr. Terrien is a  very pleasant 76 yo caucasian gentleman with myelodysplasia and refractory anemia. He is feeling better and has no complaints at this time other than some fatigue.  We will proceed with Decitabine day 1 of cycle 15.  He has his current treatment and appointment schedule and will be here all week for infusion with follow-up again on 06/24/17.  Both he and his sweet wife are good to contact our office with any questions or concerns. We can certainly see him sooner if need be.   Eliezer Bottom, NP 11/26/20189:21 AM

## 2017-05-07 ENCOUNTER — Ambulatory Visit (HOSPITAL_BASED_OUTPATIENT_CLINIC_OR_DEPARTMENT_OTHER): Payer: Medicare Other

## 2017-05-07 ENCOUNTER — Other Ambulatory Visit: Payer: Self-pay

## 2017-05-07 VITALS — BP 121/61 | HR 92 | Temp 97.6°F | Resp 18

## 2017-05-07 DIAGNOSIS — Z5111 Encounter for antineoplastic chemotherapy: Secondary | ICD-10-CM | POA: Diagnosis not present

## 2017-05-07 DIAGNOSIS — D4621 Refractory anemia with excess of blasts 1: Secondary | ICD-10-CM

## 2017-05-07 DIAGNOSIS — D4620 Refractory anemia with excess of blasts, unspecified: Secondary | ICD-10-CM

## 2017-05-07 MED ORDER — HEPARIN SOD (PORK) LOCK FLUSH 100 UNIT/ML IV SOLN
500.0000 [IU] | Freq: Once | INTRAVENOUS | Status: AC | PRN
  Administered 2017-05-07: 500 [IU]
  Filled 2017-05-07: qty 5

## 2017-05-07 MED ORDER — SODIUM CHLORIDE 0.9 % IV SOLN
Freq: Once | INTRAVENOUS | Status: AC
  Administered 2017-05-07: 11:00:00 via INTRAVENOUS

## 2017-05-07 MED ORDER — PROCHLORPERAZINE MALEATE 10 MG PO TABS
10.0000 mg | ORAL_TABLET | Freq: Once | ORAL | Status: AC
  Administered 2017-05-07: 10 mg via ORAL

## 2017-05-07 MED ORDER — SODIUM CHLORIDE 0.9 % IV SOLN
20.0000 mg/m2 | Freq: Once | INTRAVENOUS | Status: AC
  Administered 2017-05-07: 45 mg via INTRAVENOUS
  Filled 2017-05-07: qty 9

## 2017-05-07 MED ORDER — PROCHLORPERAZINE MALEATE 10 MG PO TABS
ORAL_TABLET | ORAL | Status: AC
  Filled 2017-05-07: qty 1

## 2017-05-07 MED ORDER — SODIUM CHLORIDE 0.9% FLUSH
10.0000 mL | INTRAVENOUS | Status: DC | PRN
Start: 2017-05-07 — End: 2017-05-07
  Administered 2017-05-07: 10 mL
  Filled 2017-05-07: qty 10

## 2017-05-08 ENCOUNTER — Ambulatory Visit (HOSPITAL_BASED_OUTPATIENT_CLINIC_OR_DEPARTMENT_OTHER): Payer: Medicare Other

## 2017-05-08 ENCOUNTER — Other Ambulatory Visit: Payer: Self-pay | Admitting: *Deleted

## 2017-05-08 VITALS — BP 118/73 | HR 90 | Temp 98.0°F | Resp 18

## 2017-05-08 DIAGNOSIS — D4620 Refractory anemia with excess of blasts, unspecified: Secondary | ICD-10-CM

## 2017-05-08 DIAGNOSIS — D4621 Refractory anemia with excess of blasts 1: Secondary | ICD-10-CM

## 2017-05-08 DIAGNOSIS — Z5111 Encounter for antineoplastic chemotherapy: Secondary | ICD-10-CM | POA: Diagnosis not present

## 2017-05-08 MED ORDER — HEPARIN SOD (PORK) LOCK FLUSH 100 UNIT/ML IV SOLN
500.0000 [IU] | Freq: Once | INTRAVENOUS | Status: AC | PRN
  Administered 2017-05-08: 500 [IU]
  Filled 2017-05-08: qty 5

## 2017-05-08 MED ORDER — PROCHLORPERAZINE MALEATE 10 MG PO TABS
10.0000 mg | ORAL_TABLET | Freq: Once | ORAL | Status: AC
  Administered 2017-05-08: 10 mg via ORAL

## 2017-05-08 MED ORDER — SODIUM CHLORIDE 0.9 % IV SOLN
Freq: Once | INTRAVENOUS | Status: AC
  Administered 2017-05-08: 11:00:00 via INTRAVENOUS

## 2017-05-08 MED ORDER — PROCHLORPERAZINE MALEATE 10 MG PO TABS
ORAL_TABLET | ORAL | Status: AC
  Filled 2017-05-08: qty 1

## 2017-05-08 MED ORDER — SODIUM CHLORIDE 0.9% FLUSH
10.0000 mL | INTRAVENOUS | Status: DC | PRN
  Administered 2017-05-08: 10 mL
  Filled 2017-05-08: qty 10

## 2017-05-08 MED ORDER — SODIUM CHLORIDE 0.9 % IV SOLN
20.0000 mg/m2 | Freq: Once | INTRAVENOUS | Status: AC
  Administered 2017-05-08: 45 mg via INTRAVENOUS
  Filled 2017-05-08: qty 9

## 2017-05-08 MED ORDER — PROCHLORPERAZINE MALEATE 10 MG PO TABS: 10.0000 mg | ORAL_TABLET | Freq: Four times a day (QID) | ORAL | 3 refills | Status: DC | PRN

## 2017-05-08 MED FILL — PROCHLORPERAZINE 10 MG TAB: 10 | 7 days supply | Qty: 30 | Fill #0

## 2017-05-08 NOTE — Patient Instructions (Signed)
Decitabine injection for infusion What is this medicine? DECITABINE (dee SYE ta been) is a chemotherapy drug. This medicine reduces the growth of cancer cells. It is used to treat adults with myelodysplastic syndromes. This medicine may be used for other purposes; ask your health care provider or pharmacist if you have questions. COMMON BRAND NAME(S): Dacogen What should I tell my health care provider before I take this medicine? They need to know if you have any of these conditions: -infection (especially a virus infection such as chickenpox, cold sores, or herpes) -kidney disease -liver disease -an unusual or allergic reaction to decitabine, other medicines, foods, dyes, or preservatives -pregnant or trying to get pregnant -breast-feeding How should I use this medicine? This medicine is for infusion into a vein. It is administered in a hospital or clinic by a doctor or health care professional. Talk to your pediatrician regarding the use of this medicine in children. Special care may be needed. Overdosage: If you think you have taken too much of this medicine contact a poison control center or emergency room at once. NOTE: This medicine is only for you. Do not share this medicine with others. What if I miss a dose? It is important not to miss your dose. Call your doctor or health care professional if you are unable to keep an appointment. What may interact with this medicine? -vaccines Talk to your doctor or health care professional before taking any of these medicines: -aspirin -acetaminophen -ibuprofen -ketoprofen -naproxen This list may not describe all possible interactions. Give your health care provider a list of all the medicines, herbs, non-prescription drugs, or dietary supplements you use. Also tell them if you smoke, drink alcohol, or use illegal drugs. Some items may interact with your medicine. What should I watch for while using this medicine? Visit your doctor for  checks on your progress. This drug may make you feel generally unwell. This is not uncommon, as chemotherapy can affect healthy cells as well as cancer cells. Report any side effects. Continue your course of treatment even though you feel ill unless your doctor tells you to stop. In some cases, you may be given additional medicines to help with side effects. Follow all directions for their use. Call your doctor or health care professional for advice if you get a fever, chills or sore throat, or other symptoms of a cold or flu. Do not treat yourself. This drug decreases your body's ability to fight infections. Try to avoid being around people who are sick. This medicine may increase your risk to bruise or bleed. Call your doctor or health care professional if you notice any unusual bleeding. Do not become pregnant while taking this medicine or for at least 1 month after stopping it. Women should inform their doctor if they wish to become pregnant or think they might be pregnant. Men should not father a child while taking this medicine and for at least 2 months after stopping it. There is a potential for serious side effects to an unborn child. Talk to your health care professional or pharmacist for more information. Do not breast-feed an infant while taking this medicine. What side effects may I notice from receiving this medicine? Side effects that you should report to your doctor or health care professional as soon as possible: -low blood counts - this medicine may decrease the number of white blood cells, red blood cells and platelets. You may be at increased risk for infections and bleeding. -signs of infection - fever or   chills, cough, sore throat, pain or difficulty passing urine -signs of decreased platelets or bleeding - bruising, pinpoint red spots on the skin, black, tarry stools, blood in the urine -signs of decreased red blood cells - unusual weakness or tiredness, fainting spells,  lightheadedness -increased blood sugar Side effects that usually do not require medical attention (report to your doctor or health care professional if they continue or are bothersome): -constipation -diarrhea -headache -loss of appetite -nausea, vomiting -skin rash, itching -stomach pain -water retention -weak or tired This list may not describe all possible side effects. Call your doctor for medical advice about side effects. You may report side effects to FDA at 1-800-FDA-1088. Where should I keep my medicine? This drug is given in a hospital or clinic and will not be stored at home. NOTE: This sheet is a summary. It may not cover all possible information. If you have questions about this medicine, talk to your doctor, pharmacist, or health care provider.  2018 Elsevier/Gold Standard (2015-06-30 15:52:57)  

## 2017-05-09 ENCOUNTER — Ambulatory Visit (HOSPITAL_BASED_OUTPATIENT_CLINIC_OR_DEPARTMENT_OTHER): Payer: Medicare Other

## 2017-05-09 VITALS — BP 126/64 | HR 80 | Resp 18

## 2017-05-09 DIAGNOSIS — Z5111 Encounter for antineoplastic chemotherapy: Secondary | ICD-10-CM

## 2017-05-09 DIAGNOSIS — D4620 Refractory anemia with excess of blasts, unspecified: Secondary | ICD-10-CM

## 2017-05-09 DIAGNOSIS — D4621 Refractory anemia with excess of blasts 1: Secondary | ICD-10-CM

## 2017-05-09 MED ORDER — SODIUM CHLORIDE 0.9% FLUSH
10.0000 mL | INTRAVENOUS | Status: DC | PRN
  Administered 2017-05-09: 10 mL
  Filled 2017-05-09: qty 10

## 2017-05-09 MED ORDER — HEPARIN SOD (PORK) LOCK FLUSH 100 UNIT/ML IV SOLN
500.0000 [IU] | Freq: Once | INTRAVENOUS | Status: AC | PRN
  Administered 2017-05-09: 500 [IU]
  Filled 2017-05-09: qty 5

## 2017-05-09 MED ORDER — SODIUM CHLORIDE 0.9 % IV SOLN
Freq: Once | INTRAVENOUS | Status: AC
  Administered 2017-05-09: 11:00:00 via INTRAVENOUS

## 2017-05-09 MED ORDER — PROCHLORPERAZINE MALEATE 10 MG PO TABS: 10.0000 mg | ORAL_TABLET | Freq: Once | ORAL | Status: DC

## 2017-05-09 MED ORDER — SODIUM CHLORIDE 0.9 % IV SOLN
20.0000 mg/m2 | Freq: Once | INTRAVENOUS | Status: AC
  Administered 2017-05-09: 45 mg via INTRAVENOUS
  Filled 2017-05-09: qty 9

## 2017-05-09 NOTE — Patient Instructions (Signed)
Decitabine injection for infusion What is this medicine? DECITABINE (dee SYE ta been) is a chemotherapy drug. This medicine reduces the growth of cancer cells. It is used to treat adults with myelodysplastic syndromes. This medicine may be used for other purposes; ask your health care provider or pharmacist if you have questions. COMMON BRAND NAME(S): Dacogen What should I tell my health care provider before I take this medicine? They need to know if you have any of these conditions: -infection (especially a virus infection such as chickenpox, cold sores, or herpes) -kidney disease -liver disease -an unusual or allergic reaction to decitabine, other medicines, foods, dyes, or preservatives -pregnant or trying to get pregnant -breast-feeding How should I use this medicine? This medicine is for infusion into a vein. It is administered in a hospital or clinic by a doctor or health care professional. Talk to your pediatrician regarding the use of this medicine in children. Special care may be needed. Overdosage: If you think you have taken too much of this medicine contact a poison control center or emergency room at once. NOTE: This medicine is only for you. Do not share this medicine with others. What if I miss a dose? It is important not to miss your dose. Call your doctor or health care professional if you are unable to keep an appointment. What may interact with this medicine? -vaccines Talk to your doctor or health care professional before taking any of these medicines: -aspirin -acetaminophen -ibuprofen -ketoprofen -naproxen This list may not describe all possible interactions. Give your health care provider a list of all the medicines, herbs, non-prescription drugs, or dietary supplements you use. Also tell them if you smoke, drink alcohol, or use illegal drugs. Some items may interact with your medicine. What should I watch for while using this medicine? Visit your doctor for  checks on your progress. This drug may make you feel generally unwell. This is not uncommon, as chemotherapy can affect healthy cells as well as cancer cells. Report any side effects. Continue your course of treatment even though you feel ill unless your doctor tells you to stop. In some cases, you may be given additional medicines to help with side effects. Follow all directions for their use. Call your doctor or health care professional for advice if you get a fever, chills or sore throat, or other symptoms of a cold or flu. Do not treat yourself. This drug decreases your body's ability to fight infections. Try to avoid being around people who are sick. This medicine may increase your risk to bruise or bleed. Call your doctor or health care professional if you notice any unusual bleeding. Do not become pregnant while taking this medicine or for at least 1 month after stopping it. Women should inform their doctor if they wish to become pregnant or think they might be pregnant. Men should not father a child while taking this medicine and for at least 2 months after stopping it. There is a potential for serious side effects to an unborn child. Talk to your health care professional or pharmacist for more information. Do not breast-feed an infant while taking this medicine. What side effects may I notice from receiving this medicine? Side effects that you should report to your doctor or health care professional as soon as possible: -low blood counts - this medicine may decrease the number of white blood cells, red blood cells and platelets. You may be at increased risk for infections and bleeding. -signs of infection - fever or   chills, cough, sore throat, pain or difficulty passing urine -signs of decreased platelets or bleeding - bruising, pinpoint red spots on the skin, black, tarry stools, blood in the urine -signs of decreased red blood cells - unusual weakness or tiredness, fainting spells,  lightheadedness -increased blood sugar Side effects that usually do not require medical attention (report to your doctor or health care professional if they continue or are bothersome): -constipation -diarrhea -headache -loss of appetite -nausea, vomiting -skin rash, itching -stomach pain -water retention -weak or tired This list may not describe all possible side effects. Call your doctor for medical advice about side effects. You may report side effects to FDA at 1-800-FDA-1088. Where should I keep my medicine? This drug is given in a hospital or clinic and will not be stored at home. NOTE: This sheet is a summary. It may not cover all possible information. If you have questions about this medicine, talk to your doctor, pharmacist, or health care provider.  2018 Elsevier/Gold Standard (2015-06-30 15:52:57)  

## 2017-05-10 ENCOUNTER — Ambulatory Visit (HOSPITAL_BASED_OUTPATIENT_CLINIC_OR_DEPARTMENT_OTHER): Payer: Medicare Other

## 2017-05-10 DIAGNOSIS — Z5111 Encounter for antineoplastic chemotherapy: Secondary | ICD-10-CM

## 2017-05-10 DIAGNOSIS — D4620 Refractory anemia with excess of blasts, unspecified: Secondary | ICD-10-CM

## 2017-05-10 DIAGNOSIS — D4621 Refractory anemia with excess of blasts 1: Secondary | ICD-10-CM

## 2017-05-10 MED ORDER — SODIUM CHLORIDE 0.9 % IV SOLN
Freq: Once | INTRAVENOUS | Status: AC
  Administered 2017-05-10: 11:00:00 via INTRAVENOUS

## 2017-05-10 MED ORDER — HEPARIN SOD (PORK) LOCK FLUSH 100 UNIT/ML IV SOLN
500.0000 [IU] | Freq: Once | INTRAVENOUS | Status: AC | PRN
  Administered 2017-05-10: 500 [IU]
  Filled 2017-05-10: qty 5

## 2017-05-10 MED ORDER — SODIUM CHLORIDE 0.9% FLUSH
10.0000 mL | INTRAVENOUS | Status: DC | PRN
  Administered 2017-05-10: 10 mL
  Filled 2017-05-10: qty 10

## 2017-05-10 MED ORDER — SODIUM CHLORIDE 0.9 % IV SOLN
20.0000 mg/m2 | Freq: Once | INTRAVENOUS | Status: AC
  Administered 2017-05-10: 45 mg via INTRAVENOUS
  Filled 2017-05-10: qty 9

## 2017-05-10 MED ORDER — PROCHLORPERAZINE MALEATE 10 MG PO TABS: 10.0000 mg | ORAL_TABLET | Freq: Once | ORAL | Status: DC

## 2017-05-10 NOTE — Patient Instructions (Signed)
Decitabine injection for infusion What is this medicine? DECITABINE (dee SYE ta been) is a chemotherapy drug. This medicine reduces the growth of cancer cells. It is used to treat adults with myelodysplastic syndromes. This medicine may be used for other purposes; ask your health care provider or pharmacist if you have questions. COMMON BRAND NAME(S): Dacogen What should I tell my health care provider before I take this medicine? They need to know if you have any of these conditions: -infection (especially a virus infection such as chickenpox, cold sores, or herpes) -kidney disease -liver disease -an unusual or allergic reaction to decitabine, other medicines, foods, dyes, or preservatives -pregnant or trying to get pregnant -breast-feeding How should I use this medicine? This medicine is for infusion into a vein. It is administered in a hospital or clinic by a doctor or health care professional. Talk to your pediatrician regarding the use of this medicine in children. Special care may be needed. Overdosage: If you think you have taken too much of this medicine contact a poison control center or emergency room at once. NOTE: This medicine is only for you. Do not share this medicine with others. What if I miss a dose? It is important not to miss your dose. Call your doctor or health care professional if you are unable to keep an appointment. What may interact with this medicine? -vaccines Talk to your doctor or health care professional before taking any of these medicines: -aspirin -acetaminophen -ibuprofen -ketoprofen -naproxen This list may not describe all possible interactions. Give your health care provider a list of all the medicines, herbs, non-prescription drugs, or dietary supplements you use. Also tell them if you smoke, drink alcohol, or use illegal drugs. Some items may interact with your medicine. What should I watch for while using this medicine? Visit your doctor for  checks on your progress. This drug may make you feel generally unwell. This is not uncommon, as chemotherapy can affect healthy cells as well as cancer cells. Report any side effects. Continue your course of treatment even though you feel ill unless your doctor tells you to stop. In some cases, you may be given additional medicines to help with side effects. Follow all directions for their use. Call your doctor or health care professional for advice if you get a fever, chills or sore throat, or other symptoms of a cold or flu. Do not treat yourself. This drug decreases your body's ability to fight infections. Try to avoid being around people who are sick. This medicine may increase your risk to bruise or bleed. Call your doctor or health care professional if you notice any unusual bleeding. Do not become pregnant while taking this medicine or for at least 1 month after stopping it. Women should inform their doctor if they wish to become pregnant or think they might be pregnant. Men should not father a child while taking this medicine and for at least 2 months after stopping it. There is a potential for serious side effects to an unborn child. Talk to your health care professional or pharmacist for more information. Do not breast-feed an infant while taking this medicine. What side effects may I notice from receiving this medicine? Side effects that you should report to your doctor or health care professional as soon as possible: -low blood counts - this medicine may decrease the number of white blood cells, red blood cells and platelets. You may be at increased risk for infections and bleeding. -signs of infection - fever or   chills, cough, sore throat, pain or difficulty passing urine -signs of decreased platelets or bleeding - bruising, pinpoint red spots on the skin, black, tarry stools, blood in the urine -signs of decreased red blood cells - unusual weakness or tiredness, fainting spells,  lightheadedness -increased blood sugar Side effects that usually do not require medical attention (report to your doctor or health care professional if they continue or are bothersome): -constipation -diarrhea -headache -loss of appetite -nausea, vomiting -skin rash, itching -stomach pain -water retention -weak or tired This list may not describe all possible side effects. Call your doctor for medical advice about side effects. You may report side effects to FDA at 1-800-FDA-1088. Where should I keep my medicine? This drug is given in a hospital or clinic and will not be stored at home. NOTE: This sheet is a summary. It may not cover all possible information. If you have questions about this medicine, talk to your doctor, pharmacist, or health care provider.  2018 Elsevier/Gold Standard (2015-06-30 15:52:57)  

## 2017-06-24 ENCOUNTER — Inpatient Hospital Stay: Payer: Medicare Other

## 2017-06-24 ENCOUNTER — Inpatient Hospital Stay: Payer: Medicare Other | Attending: Family | Admitting: Hematology & Oncology

## 2017-06-24 ENCOUNTER — Encounter: Payer: Self-pay | Admitting: Hematology & Oncology

## 2017-06-24 ENCOUNTER — Other Ambulatory Visit: Payer: Self-pay

## 2017-06-24 VITALS — BP 119/61 | HR 93 | Temp 98.2°F | Resp 18 | Wt 220.5 lb

## 2017-06-24 DIAGNOSIS — D4620 Refractory anemia with excess of blasts, unspecified: Secondary | ICD-10-CM

## 2017-06-24 DIAGNOSIS — D500 Iron deficiency anemia secondary to blood loss (chronic): Secondary | ICD-10-CM

## 2017-06-24 DIAGNOSIS — D4621 Refractory anemia with excess of blasts 1: Secondary | ICD-10-CM | POA: Insufficient documentation

## 2017-06-24 DIAGNOSIS — Z79899 Other long term (current) drug therapy: Secondary | ICD-10-CM | POA: Insufficient documentation

## 2017-06-24 LAB — CBC WITH DIFFERENTIAL (CANCER CENTER ONLY)
BASOS ABS: 0 10*3/uL (ref 0.0–0.1)
Basophils Relative: 0 %
EOS PCT: 0 %
Eosinophils Absolute: 0 10*3/uL (ref 0.0–0.5)
HCT: 38.5 % — ABNORMAL LOW (ref 38.7–49.9)
Hemoglobin: 12.1 g/dL — ABNORMAL LOW (ref 13.0–17.1)
LYMPHS PCT: 5 %
Lymphs Abs: 0.4 10*3/uL — ABNORMAL LOW (ref 0.9–3.3)
MCH: 33.2 pg (ref 28.0–33.4)
MCHC: 31.4 g/dL — AB (ref 32.0–35.9)
MCV: 105.5 fL — ABNORMAL HIGH (ref 82.0–98.0)
MONO ABS: 0.6 10*3/uL (ref 0.1–0.9)
Monocytes Relative: 6 %
Neutro Abs: 7.9 10*3/uL — ABNORMAL HIGH (ref 1.5–6.5)
Neutrophils Relative %: 89 %
PLATELETS: 218 10*3/uL (ref 140–400)
RBC: 3.65 MIL/uL — ABNORMAL LOW (ref 4.20–5.70)
RDW: 15.4 % (ref 11.1–15.7)
WBC Count: 8.9 10*3/uL (ref 4.0–10.3)

## 2017-06-24 LAB — CMP (CANCER CENTER ONLY)
ALT: 33 U/L (ref 0–55)
AST: 20 U/L (ref 5–34)
Albumin: 3.6 g/dL (ref 3.5–5.0)
Alkaline Phosphatase: 45 U/L (ref 26–84)
Anion gap: 12 (ref 5–15)
BUN: 23 mg/dL — AB (ref 7–22)
CALCIUM: 9.4 mg/dL (ref 8.0–10.3)
CO2: 28 mmol/L (ref 18–33)
Chloride: 103 mmol/L (ref 98–108)
Creatinine: 1.2 mg/dL (ref 0.70–1.30)
GLUCOSE: 143 mg/dL — AB (ref 70–118)
POTASSIUM: 4.5 mmol/L (ref 3.3–4.7)
SODIUM: 143 mmol/L (ref 128–145)
TOTAL PROTEIN: 6.7 g/dL (ref 6.4–8.1)
Total Bilirubin: 0.5 mg/dL (ref 0.2–1.2)

## 2017-06-24 LAB — RETICULOCYTES
RBC.: 3.68 MIL/uL — ABNORMAL LOW (ref 4.20–5.82)
RETIC CT PCT: 1.9 % — AB (ref 0.8–1.8)
Retic Count, Absolute: 69.9 10*3/uL (ref 34.8–93.9)

## 2017-06-24 MED ORDER — SODIUM CHLORIDE 0.9% FLUSH
10.0000 mL | INTRAVENOUS | Status: DC | PRN
  Administered 2017-06-24: 10 mL
  Filled 2017-06-24: qty 10

## 2017-06-24 MED ORDER — HEPARIN SOD (PORK) LOCK FLUSH 100 UNIT/ML IV SOLN
500.0000 [IU] | Freq: Once | INTRAVENOUS | Status: AC | PRN
  Administered 2017-06-24: 500 [IU]
  Filled 2017-06-24: qty 5

## 2017-06-24 MED ORDER — SODIUM CHLORIDE 0.9 % IV SOLN
Freq: Once | INTRAVENOUS | Status: AC
  Administered 2017-06-24: 14:00:00 via INTRAVENOUS

## 2017-06-24 MED ORDER — SODIUM CHLORIDE 0.9 % IV SOLN
20.0000 mg/m2 | Freq: Once | INTRAVENOUS | Status: AC
  Administered 2017-06-24: 45 mg via INTRAVENOUS
  Filled 2017-06-24: qty 9

## 2017-06-24 MED ORDER — PROCHLORPERAZINE MALEATE 10 MG PO TABS: 10.0000 mg | ORAL_TABLET | Freq: Once | ORAL | Status: DC

## 2017-06-24 MED ORDER — SODIUM CHLORIDE 0.9 % IJ SOLN
10.0000 mL | Freq: Once | INTRAMUSCULAR | Status: AC
  Administered 2017-06-24: 10 mL
  Filled 2017-06-24: qty 10

## 2017-06-24 NOTE — Progress Notes (Signed)
Hematology and Oncology Follow Up Visit  Anthony [name] [id number] 02-03-1941 77 y.o. 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   He is a little bit down because of the performance by the [employer] basketball team this past weekend.  It was horrendous.  He otherwise has done very well.  He got through all the holidays without any difficulty.  He has had no problems with fever.  He has had no cough or shortness of breath.  He has had no fatigue.  He has had no dizziness.  There is been no problems with rashes.  He has had no leg swelling.  His iron studies back in November showed a ferritin of 1472.  His iron saturation was only 34%.  We have not had to transfuse him now probably for over a year.  He has had no change in medications.  His appetite has been quite good.  His wife had her birthday a week ago.  They basically stayed home.  ECOG Performance Status: 1 - Symptomatic but completely ambulatory  Medications:  Allergies as of 06/24/2017   No Known Allergies     Medication List        Accurate as of 06/24/17  2:29 PM. Always use your most recent med list.          bimatoprost 0.01 % Soln Commonly known as:  LUMIGAN Place 1 drop into both eyes at bedtime.   budesonide-formoterol 160-4.5 MCG/ACT inhaler Commonly known as:  SYMBICORT Inhale 2 puffs into the lungs 2 (two) times daily.   buPROPion 150 MG 24 hr tablet Commonly known as:  WELLBUTRIN XL Take 150 mg by mouth daily.   clopidogrel 75 MG tablet Commonly known as:  PLAVIX Take 75 mg by mouth daily.   co-enzyme Q-10 30 MG  capsule Take 100 mg by mouth daily.   Cyanocobalamin 2500 MCG Tabs Take 5,000 mcg by mouth daily.   fish oil-omega-3 fatty acids 1000 MG capsule Take 1 g by mouth daily.   glucosamine-chondroitin 500-400 MG tablet Take 1 tablet by mouth daily.   LORazepam 0.5 MG tablet Commonly known as:  ATIVAN Take 1 tablet (0.5 mg total) by mouth every 6 (six) hours as needed (Nausea or vomiting).   magnesium oxide 400 MG tablet Commonly known as:  MAG-OX Take 400 mg by mouth daily.   orphenadrine 100 MG tablet Commonly known as:  NORFLEX Take 1 tablet (100 mg total) by mouth 2 (two) times daily as needed for muscle spasms.   pantoprazole 40 MG tablet Commonly known as:  PROTONIX Take 40 mg by mouth daily.   predniSONE 20 MG tablet Commonly known as:  DELTASONE Take 1 tablet (20 mg total) by mouth daily with breakfast.   PROAIR RESPICLICK 169 (90 Base) MCG/ACT Aepb Generic drug:  Albuterol Sulfate Inhale 2 puffs into the lungs every 6 (six) hours as needed.   prochlorperazine 10 MG tablet Commonly known as:  COMPAZINE Take 1 tablet (10 mg total) by mouth every 6 (six) hours as needed (Nausea or vomiting).   prochlorperazine 10 MG  tablet Commonly known as:  COMPAZINE Take 1 tablet (10 mg total) by mouth every 6 (six) hours as needed for nausea or vomiting.   ranitidine 300 MG tablet Commonly known as:  ZANTAC Take 300 mg by mouth at bedtime.   sertraline 100 MG tablet Commonly known as:  ZOLOFT Take 100 mg by mouth daily.   SIMBRINZA 1-0.2 % Susp Generic drug:  Brinzolamide-Brimonidine   simvastatin 40 MG tablet Commonly known as:  ZOCOR Take 40 mg by mouth every other day.   tamsulosin 0.4 MG Caps capsule Commonly known as:  FLOMAX   tiotropium 18 MCG inhalation capsule Commonly known as:  SPIRIVA Place 18 mcg into inhaler and inhale daily.   triamcinolone cream 0.1 % Commonly known as:  KENALOG APPLY TO RASH/ITCH ON ARMS TWICE DAILY AS NEEDED   Vitamin D3 5000  units Tabs Take by mouth daily.       Allergies: No Known Allergies  Past Medical History, Surgical history, Social history, and Family History were reviewed and updated.  Review of Systems: Review of Systems  Constitutional: Negative.   HENT: Negative.   Eyes: Negative.   Respiratory: Negative.   Cardiovascular: Negative.   Gastrointestinal: Negative.   Genitourinary: Negative.   Musculoskeletal: Negative.   Skin: Negative.   Neurological: Negative.   Endo/Heme/Allergies: Negative.   Psychiatric/Behavioral: Negative.       Physical Exam:  weight is 220 lb 8 oz (100 kg). His oral temperature is 98.2 F (36.8 C). His blood pressure is 119/61 and his pulse is 93. His respiration is 18 and oxygen saturation is 98%.   Wt Readings from Last 3 Encounters:  06/24/17 220 lb 8 oz (100 kg)  03/18/17 218 lb 1.6 oz (98.9 kg)  01/28/17 217 lb (98.4 kg)    Physical Exam  Constitutional: He is oriented to person, place, and time.  HENT:  Head: Normocephalic and atraumatic.  Mouth/Throat: Oropharynx is clear and moist.  Eyes: EOM are normal. Pupils are equal, round, and reactive to light.  Neck: Normal range of motion.  Cardiovascular: Normal rate, regular rhythm and normal heart sounds.  Pulmonary/Chest: Effort normal and breath sounds normal.  Abdominal: Soft. Bowel sounds are normal.  Musculoskeletal: Normal range of motion. He exhibits no edema, tenderness or deformity.  Lymphadenopathy:    He has no cervical adenopathy.  Neurological: He is alert and oriented to person, place, and time.  Skin: Skin is warm and dry. No rash noted. No erythema.  Psychiatric: He has a normal mood and affect. His behavior is normal. Judgment and thought content normal.  Vitals reviewed.   Lab Results  Component Value Date   WBC 9.6 05/06/2017   HGB 11.5 (L) 05/06/2017   HCT 38.5 (L) 06/24/2017   MCV 105.5 (H) 06/24/2017   PLT 213 05/06/2017   Lab Results  Component Value Date    FERRITIN 1,472 (H) 05/06/2017   IRON 92 05/06/2017   TIBC 269 05/06/2017   UIBC 177 05/06/2017   IRONPCTSAT 34 05/06/2017   Lab Results  Component Value Date   RETICCTPCT 1.7 05/30/2015   RBC 3.65 (L) 06/24/2017   RETICCTABS 39.4 05/30/2015   Lab Results  Component Value Date   KAPLAMBRATIO 1.11 12/17/2016   Lab Results  Component Value Date   IGGSERUM 735 12/17/2016   IGMSERUM 79 12/17/2016   Lab Results  Component Value Date   TOTALPROTELP 7.3 01/01/2014   ALBUMINELP 55.1 (L) 01/01/2014   A1GS 6.9 (H) 01/01/2014   A2GS  8.9 01/01/2014   BETS 7.9 (H) 01/01/2014   BETA2SER 6.7 (H) 01/01/2014   GAMS 14.5 01/01/2014   MSPIKE Not Observed 12/17/2016   SPEI * 01/01/2014     Chemistry      Component Value Date/Time   NA 143 06/24/2017 1300   NA 141 05/06/2017 0901   NA 140 04/16/2016 0930   K 4.5 06/24/2017 1300   K 3.8 05/06/2017 0901   K 3.9 04/16/2016 0930   CL 103 06/24/2017 1300   CL 105 05/06/2017 0901   CO2 28 06/24/2017 1300   CO2 28 05/06/2017 0901   CO2 24 04/16/2016 0930   BUN 23 (H) 06/24/2017 1300   BUN 27 (H) 05/06/2017 0901   BUN 20.5 04/16/2016 0930   CREATININE 1.4 (H) 05/06/2017 0901   CREATININE 1.2 04/16/2016 0930      Component Value Date/Time   CALCIUM 9.4 06/24/2017 1300   CALCIUM 8.8 05/06/2017 0901   CALCIUM 8.9 04/16/2016 0930   ALKPHOS 45 06/24/2017 1300   ALKPHOS 46 05/06/2017 0901   ALKPHOS 54 04/16/2016 0930   AST 20 06/24/2017 1300   AST 16 04/16/2016 0930   ALT 33 06/24/2017 1300   ALT 25 05/06/2017 0901   ALT 21 04/16/2016 0930   BILITOT 0.5 06/24/2017 1300   BILITOT 0.27 04/16/2016 0930      Impression and Plan: Mr. Serano is a very pleasant 77 yo caucasian gentleman with myelodysplasia and refractory anemia.   I must say, that he is done incredibly well.  We now are on our third year.  So far, there is been no indication that he is progressing.  I do not see a need for a bone marrow test right now.  His last  pulmonary test was actually a year ago in December.  I think that as long as we do not see much change in his blood counts, I just do not see that a bone marrow test really is going to help [place].  We will plan to get him back in another 6 weeks.  I think that if his blood counts stabilize over the next few months, we might make his treatments every 2 months.  Volanda Napoleon, MD 1/14/20192:29 PM

## 2017-06-24 NOTE — Patient Instructions (Signed)
Implanted Port Home Guide An implanted port is a type of central line that is placed under the skin. Central lines are used to provide IV access when treatment or nutrition needs to be given through a person's veins. Implanted ports are used for long-term IV access. An implanted port may be placed because:  You need IV medicine that would be irritating to the small veins in your hands or arms.  You need long-term IV medicines, such as antibiotics.  You need IV nutrition for a long period.  You need frequent blood draws for lab tests.  You need dialysis.  Implanted ports are usually placed in the chest area, but they can also be placed in the upper arm, the abdomen, or the leg. An implanted port has two main parts:  Reservoir. The reservoir is round and will appear as a small, raised area under your skin. The reservoir is the part where a needle is inserted to give medicines or draw blood.  Catheter. The catheter is a thin, flexible tube that extends from the reservoir. The catheter is placed into a large vein. Medicine that is inserted into the reservoir goes into the catheter and then into the vein.  How will I care for my incision site? Do not get the incision site wet. Bathe or shower as directed by your health care provider. How is my port accessed? Special steps must be taken to access the port:  Before the port is accessed, a numbing cream can be placed on the skin. This helps numb the skin over the port site.  Your health care provider uses a sterile technique to access the port. ? Your health care provider must put on a mask and sterile gloves. ? The skin over your port is cleaned carefully with an antiseptic and allowed to dry. ? The port is gently pinched between sterile gloves, and a needle is inserted into the port.  Only "non-coring" port needles should be used to access the port. Once the port is accessed, a blood return should be checked. This helps ensure that the port  is in the vein and is not clogged.  If your port needs to remain accessed for a constant infusion, a clear (transparent) bandage will be placed over the needle site. The bandage and needle will need to be changed every week, or as directed by your health care provider.  Keep the bandage covering the needle clean and dry. Do not get it wet. Follow your health care provider's instructions on how to take a shower or bath while the port is accessed.  If your port does not need to stay accessed, no bandage is needed over the port.  What is flushing? Flushing helps keep the port from getting clogged. Follow your health care provider's instructions on how and when to flush the port. Ports are usually flushed with saline solution or a medicine called heparin. The need for flushing will depend on how the port is used.  If the port is used for intermittent medicines or blood draws, the port will need to be flushed: ? After medicines have been given. ? After blood has been drawn. ? As part of routine maintenance.  If a constant infusion is running, the port may not need to be flushed.  How long will my port stay implanted? The port can stay in for as long as your health care provider thinks it is needed. When it is time for the port to come out, surgery will be   done to remove it. The procedure is similar to the one performed when the port was put in. When should I seek immediate medical care? When you have an implanted port, you should seek immediate medical care if:  You notice a bad smell coming from the incision site.  You have swelling, redness, or drainage at the incision site.  You have more swelling or pain at the port site or the surrounding area.  You have a fever that is not controlled with medicine.  This information is not intended to replace advice given to you by your health care provider. Make sure you discuss any questions you have with your health care provider. Document  Released: 05/28/2005 Document Revised: 11/03/2015 Document Reviewed: 02/02/2013 Elsevier Interactive Patient Education  2017 [employer]  

## 2017-06-24 NOTE — Patient Instructions (Signed)
[facility] Discharge Instructions for Patients Receiving Chemotherapy  Today you received the following chemotherapy agents Dacogen To help prevent nausea and vomiting after your treatment, we encourage you to take your nausea medication as prescribed.   If you develop nausea and vomiting that is not controlled by your nausea medication, call the clinic.   BELOW ARE SYMPTOMS THAT SHOULD BE REPORTED IMMEDIATELY:  *FEVER GREATER THAN 100.5 F  *CHILLS WITH OR WITHOUT FEVER  NAUSEA AND VOMITING THAT IS NOT CONTROLLED WITH YOUR NAUSEA MEDICATION  *UNUSUAL SHORTNESS OF BREATH  *UNUSUAL BRUISING OR BLEEDING  TENDERNESS IN MOUTH AND THROAT WITH OR WITHOUT PRESENCE OF ULCERS  *URINARY PROBLEMS  *BOWEL PROBLEMS  UNUSUAL RASH Items with * indicate a potential emergency and should be followed up as soon as possible.  Feel free to call the clinic should you have any questions or concerns. The clinic phone number is [phone].  Please show the CHEMO ALERT CARD at check-in to the Emergency Department and triage nurse.   

## 2017-06-25 ENCOUNTER — Other Ambulatory Visit: Payer: Self-pay

## 2017-06-25 ENCOUNTER — Other Ambulatory Visit: Payer: Self-pay | Admitting: Family

## 2017-06-25 ENCOUNTER — Other Ambulatory Visit: Payer: Self-pay | Admitting: Oncology

## 2017-06-25 ENCOUNTER — Inpatient Hospital Stay: Payer: Medicare Other

## 2017-06-25 VITALS — BP 136/56 | HR 76 | Temp 97.9°F | Resp 18

## 2017-06-25 DIAGNOSIS — F064 Anxiety disorder due to known physiological condition: Secondary | ICD-10-CM

## 2017-06-25 DIAGNOSIS — D4620 Refractory anemia with excess of blasts, unspecified: Secondary | ICD-10-CM

## 2017-06-25 DIAGNOSIS — D4621 Refractory anemia with excess of blasts 1: Secondary | ICD-10-CM | POA: Diagnosis not present

## 2017-06-25 LAB — IRON AND TIBC
Iron: 57 ug/dL (ref 42–163)
SATURATION RATIOS: 19 % — AB (ref 42–163)
TIBC: 300 ug/dL (ref 202–409)
UIBC: 244 ug/dL

## 2017-06-25 LAB — LACTATE DEHYDROGENASE: LDH: 236 U/L (ref 125–245)

## 2017-06-25 LAB — FERRITIN: FERRITIN: 1485 ng/mL — AB (ref 22–316)

## 2017-06-25 MED ORDER — HEPARIN SOD (PORK) LOCK FLUSH 100 UNIT/ML IV SOLN
500.0000 [IU] | Freq: Once | INTRAVENOUS | Status: AC | PRN
  Administered 2017-06-25: 500 [IU]
  Filled 2017-06-25: qty 5

## 2017-06-25 MED ORDER — FERUMOXYTOL INJECTION 510 MG/17 ML
510.0000 mg | Freq: Once | INTRAVENOUS | Status: AC
  Administered 2017-06-25: 510 mg via INTRAVENOUS
  Filled 2017-06-25: qty 17

## 2017-06-25 MED ORDER — SODIUM CHLORIDE 0.9 % IV SOLN
20.0000 mg/m2 | Freq: Once | INTRAVENOUS | Status: AC
  Administered 2017-06-25: 45 mg via INTRAVENOUS
  Filled 2017-06-25: qty 9

## 2017-06-25 MED ORDER — SODIUM CHLORIDE 0.9% FLUSH
10.0000 mL | INTRAVENOUS | Status: DC | PRN
  Administered 2017-06-25: 10 mL
  Filled 2017-06-25: qty 10

## 2017-06-25 MED ORDER — PREDNISONE 20 MG PO TABS: 20.0000 mg | ORAL_TABLET | Freq: Every day | ORAL | 6 refills | Status: DC

## 2017-06-25 MED ORDER — PROCHLORPERAZINE MALEATE 10 MG PO TABS: 10.0000 mg | ORAL_TABLET | Freq: Once | ORAL | Status: DC

## 2017-06-25 MED ORDER — SODIUM CHLORIDE 0.9 % IV SOLN
Freq: Once | INTRAVENOUS | Status: AC
  Administered 2017-06-25: 11:00:00 via INTRAVENOUS

## 2017-06-25 MED FILL — predniSONE 20 MG TABS: 20 | 30 days supply | Qty: 30 | Fill #0 | Status: TO

## 2017-06-25 NOTE — Patient Instructions (Signed)
[facility] Discharge Instructions for Patients Receiving Chemotherapy  Today you received the following chemotherapy agents Dacogen To help prevent nausea and vomiting after your treatment, we encourage you to take your nausea medication as prescribed. If you develop nausea and vomiting that is not controlled by your nausea medication, call the clinic.   BELOW ARE SYMPTOMS THAT SHOULD BE REPORTED IMMEDIATELY:  *FEVER GREATER THAN 100.5 F  *CHILLS WITH OR WITHOUT FEVER  NAUSEA AND VOMITING THAT IS NOT CONTROLLED WITH YOUR NAUSEA MEDICATION  *UNUSUAL SHORTNESS OF BREATH  *UNUSUAL BRUISING OR BLEEDING  TENDERNESS IN MOUTH AND THROAT WITH OR WITHOUT PRESENCE OF ULCERS  *URINARY PROBLEMS  *BOWEL PROBLEMS  UNUSUAL RASH Items with * indicate a potential emergency and should be followed up as soon as possible.  Feel free to call the clinic should you have any questions or concerns. The clinic phone number is [phone].  Please show the CHEMO ALERT CARD at check-in to the Emergency Department and triage nurse.   Ferumoxytol injection (Feraheme) What is this medicine? FERUMOXYTOL is an iron complex. Iron is used to make healthy red blood cells, which carry oxygen and nutrients throughout the body. This medicine is used to treat iron deficiency anemia in people with chronic kidney disease. This medicine may be used for other purposes; ask your health care provider or pharmacist if you have questions. COMMON BRAND NAME(S): Feraheme What should I tell my health care provider before I take this medicine? They need to know if you have any of these conditions: -anemia not caused by low iron levels -high levels of iron in the blood -magnetic resonance imaging (MRI) test scheduled -an unusual or allergic reaction to iron, other medicines, foods, dyes, or preservatives -pregnant or trying to get pregnant -breast-feeding How should I use this medicine? This medicine is  for injection into a vein. It is given by a health care professional in a hospital or clinic setting. Talk to your pediatrician regarding the use of this medicine in children. Special care may be needed. Overdosage: If you think you have taken too much of this medicine contact a poison control center or emergency room at once. NOTE: This medicine is only for you. Do not share this medicine with others. What if I miss a dose? It is important not to miss your dose. Call your doctor or health care professional if you are unable to keep an appointment. What may interact with this medicine? This medicine may interact with the following medications: -other iron products This list may not describe all possible interactions. Give your health care provider a list of all the medicines, herbs, non-prescription drugs, or dietary supplements you use. Also tell them if you smoke, drink alcohol, or use illegal drugs. Some items may interact with your medicine. What should I watch for while using this medicine? Visit your doctor or healthcare professional regularly. Tell your doctor or healthcare professional if your symptoms do not start to get better or if they get worse. You may need blood work done while you are taking this medicine. You may need to follow a special diet. Talk to your doctor. Foods that contain iron include: whole grains/cereals, dried fruits, beans, or peas, leafy green vegetables, and organ meats (liver, kidney). What side effects may I notice from receiving this medicine? Side effects that you should report to your doctor or health care professional as soon as possible: -allergic reactions like skin rash, itching or hives, swelling of the face, lips,   or tongue -breathing problems -changes in blood pressure -feeling faint or lightheaded, falls -fever or chills -flushing, sweating, or hot feelings -swelling of the ankles or feet Side effects that usually do not require medical attention  (report to your doctor or health care professional if they continue or are bothersome): -diarrhea -headache -nausea, vomiting -stomach pain This list may not describe all possible side effects. Call your doctor for medical advice about side effects. You may report side effects to FDA at 1-800-FDA-1088. Where should I keep my medicine? This drug is given in a hospital or clinic and will not be stored at home. NOTE: This sheet is a summary. It may not cover all possible information. If you have questions about this medicine, talk to your doctor, pharmacist, or health care provider.  2018 Elsevier/Gold Standard (2015-06-30 12:41:49)    

## 2017-06-26 ENCOUNTER — Other Ambulatory Visit: Payer: Self-pay | Admitting: *Deleted

## 2017-06-26 ENCOUNTER — Other Ambulatory Visit: Payer: Self-pay

## 2017-06-26 ENCOUNTER — Inpatient Hospital Stay: Payer: Medicare Other

## 2017-06-26 VITALS — BP 125/63 | HR 80 | Temp 97.7°F | Resp 17

## 2017-06-26 DIAGNOSIS — D4621 Refractory anemia with excess of blasts 1: Secondary | ICD-10-CM | POA: Diagnosis not present

## 2017-06-26 DIAGNOSIS — F419 Anxiety disorder, unspecified: Secondary | ICD-10-CM

## 2017-06-26 DIAGNOSIS — D4620 Refractory anemia with excess of blasts, unspecified: Secondary | ICD-10-CM

## 2017-06-26 DIAGNOSIS — R110 Nausea: Secondary | ICD-10-CM

## 2017-06-26 MED ORDER — SODIUM CHLORIDE 0.9 % IV SOLN
20.0000 mg/m2 | Freq: Once | INTRAVENOUS | Status: AC
  Administered 2017-06-26: 45 mg via INTRAVENOUS
  Filled 2017-06-26: qty 9

## 2017-06-26 MED ORDER — PROCHLORPERAZINE MALEATE 10 MG PO TABS: 10.0000 mg | ORAL_TABLET | Freq: Once | ORAL | Status: DC

## 2017-06-26 MED ORDER — LORAZEPAM 0.5 MG PO TABS: 0.5000 mg | ORAL_TABLET | Freq: Four times a day (QID) | ORAL | 0 refills | Status: DC | PRN

## 2017-06-26 MED ORDER — HEPARIN SOD (PORK) LOCK FLUSH 100 UNIT/ML IV SOLN
500.0000 [IU] | Freq: Once | INTRAVENOUS | Status: AC | PRN
  Administered 2017-06-26: 500 [IU]
  Filled 2017-06-26: qty 5

## 2017-06-26 MED ORDER — SODIUM CHLORIDE 0.9 % IV SOLN
Freq: Once | INTRAVENOUS | Status: AC
  Administered 2017-06-26: 11:00:00 via INTRAVENOUS

## 2017-06-26 MED ORDER — SODIUM CHLORIDE 0.9% FLUSH
10.0000 mL | INTRAVENOUS | Status: DC | PRN
  Administered 2017-06-26: 10 mL
  Filled 2017-06-26: qty 10

## 2017-06-26 MED FILL — LORazepam 0.5 MG TABS: 0.5 | 23 days supply | Qty: 90 | Fill #0

## 2017-06-26 NOTE — Patient Instructions (Signed)
Decitabine injection for infusion What is this medicine? DECITABINE (dee SYE ta been) is a chemotherapy drug. This medicine reduces the growth of cancer cells. It is used to treat adults with myelodysplastic syndromes. This medicine may be used for other purposes; ask your health care provider or pharmacist if you have questions. COMMON BRAND NAME(S): Dacogen What should I tell my health care provider before I take this medicine? They need to know if you have any of these conditions: -infection (especially a virus infection such as chickenpox, cold sores, or herpes) -kidney disease -liver disease -an unusual or allergic reaction to decitabine, other medicines, foods, dyes, or preservatives -pregnant or trying to get pregnant -breast-feeding How should I use this medicine? This medicine is for infusion into a vein. It is administered in a hospital or clinic by a doctor or health care professional. Talk to your pediatrician regarding the use of this medicine in children. Special care may be needed. Overdosage: If you think you have taken too much of this medicine contact a poison control center or emergency room at once. NOTE: This medicine is only for you. Do not share this medicine with others. What if I miss a dose? It is important not to miss your dose. Call your doctor or health care professional if you are unable to keep an appointment. What may interact with this medicine? -vaccines Talk to your doctor or health care professional before taking any of these medicines: -aspirin -acetaminophen -ibuprofen -ketoprofen -naproxen This list may not describe all possible interactions. Give your health care provider a list of all the medicines, herbs, non-prescription drugs, or dietary supplements you use. Also tell them if you smoke, drink alcohol, or use illegal drugs. Some items may interact with your medicine. What should I watch for while using this medicine? Visit your doctor for  checks on your progress. This drug may make you feel generally unwell. This is not uncommon, as chemotherapy can affect healthy cells as well as cancer cells. Report any side effects. Continue your course of treatment even though you feel ill unless your doctor tells you to stop. In some cases, you may be given additional medicines to help with side effects. Follow all directions for their use. Call your doctor or health care professional for advice if you get a fever, chills or sore throat, or other symptoms of a cold or flu. Do not treat yourself. This drug decreases your body's ability to fight infections. Try to avoid being around people who are sick. This medicine may increase your risk to bruise or bleed. Call your doctor or health care professional if you notice any unusual bleeding. Do not become pregnant while taking this medicine or for at least 1 month after stopping it. Women should inform their doctor if they wish to become pregnant or think they might be pregnant. Men should not father a child while taking this medicine and for at least 2 months after stopping it. There is a potential for serious side effects to an unborn child. Talk to your health care professional or pharmacist for more information. Do not breast-feed an infant while taking this medicine. What side effects may I notice from receiving this medicine? Side effects that you should report to your doctor or health care professional as soon as possible: -low blood counts - this medicine may decrease the number of white blood cells, red blood cells and platelets. You may be at increased risk for infections and bleeding. -signs of infection - fever or   chills, cough, sore throat, pain or difficulty passing urine -signs of decreased platelets or bleeding - bruising, pinpoint red spots on the skin, black, tarry stools, blood in the urine -signs of decreased red blood cells - unusual weakness or tiredness, fainting spells,  lightheadedness -increased blood sugar Side effects that usually do not require medical attention (report to your doctor or health care professional if they continue or are bothersome): -constipation -diarrhea -headache -loss of appetite -nausea, vomiting -skin rash, itching -stomach pain -water retention -weak or tired This list may not describe all possible side effects. Call your doctor for medical advice about side effects. You may report side effects to FDA at 1-800-FDA-1088. Where should I keep my medicine? This drug is given in a hospital or clinic and will not be stored at home. NOTE: This sheet is a summary. It may not cover all possible information. If you have questions about this medicine, talk to your doctor, pharmacist, or health care provider.  2018 Elsevier/Gold Standard (2015-06-30 15:52:57)  

## 2017-06-27 ENCOUNTER — Inpatient Hospital Stay: Payer: Medicare Other

## 2017-06-27 DIAGNOSIS — D4620 Refractory anemia with excess of blasts, unspecified: Secondary | ICD-10-CM

## 2017-06-27 DIAGNOSIS — D4621 Refractory anemia with excess of blasts 1: Secondary | ICD-10-CM | POA: Diagnosis not present

## 2017-06-27 MED ORDER — SODIUM CHLORIDE 0.9% FLUSH
10.0000 mL | INTRAVENOUS | Status: DC | PRN
  Administered 2017-06-27: 10 mL
  Filled 2017-06-27: qty 10

## 2017-06-27 MED ORDER — SODIUM CHLORIDE 0.9 % IV SOLN
20.0000 mg/m2 | Freq: Once | INTRAVENOUS | Status: AC
  Administered 2017-06-27: 45 mg via INTRAVENOUS
  Filled 2017-06-27: qty 9

## 2017-06-27 MED ORDER — SODIUM CHLORIDE 0.9 % IV SOLN
Freq: Once | INTRAVENOUS | Status: AC
  Administered 2017-06-27: 10:00:00 via INTRAVENOUS

## 2017-06-27 MED ORDER — PROCHLORPERAZINE MALEATE 10 MG PO TABS: 10.0000 mg | ORAL_TABLET | Freq: Once | ORAL | Status: DC

## 2017-06-27 MED ORDER — HEPARIN SOD (PORK) LOCK FLUSH 100 UNIT/ML IV SOLN
500.0000 [IU] | Freq: Once | INTRAVENOUS | Status: AC | PRN
  Administered 2017-06-27: 500 [IU]
  Filled 2017-06-27: qty 5

## 2017-06-27 NOTE — Patient Instructions (Signed)
Decitabine injection for infusion What is this medicine? DECITABINE (dee SYE ta been) is a chemotherapy drug. This medicine reduces the growth of cancer cells. It is used to treat adults with myelodysplastic syndromes. This medicine may be used for other purposes; ask your health care provider or pharmacist if you have questions. COMMON BRAND NAME(S): Dacogen What should I tell my health care provider before I take this medicine? They need to know if you have any of these conditions: -infection (especially a virus infection such as chickenpox, cold sores, or herpes) -kidney disease -liver disease -an unusual or allergic reaction to decitabine, other medicines, foods, dyes, or preservatives -pregnant or trying to get pregnant -breast-feeding How should I use this medicine? This medicine is for infusion into a vein. It is administered in a hospital or clinic by a doctor or health care professional. Talk to your pediatrician regarding the use of this medicine in children. Special care may be needed. Overdosage: If you think you have taken too much of this medicine contact a poison control center or emergency room at once. NOTE: This medicine is only for you. Do not share this medicine with others. What if I miss a dose? It is important not to miss your dose. Call your doctor or health care professional if you are unable to keep an appointment. What may interact with this medicine? -vaccines Talk to your doctor or health care professional before taking any of these medicines: -aspirin -acetaminophen -ibuprofen -ketoprofen -naproxen This list may not describe all possible interactions. Give your health care provider a list of all the medicines, herbs, non-prescription drugs, or dietary supplements you use. Also tell them if you smoke, drink alcohol, or use illegal drugs. Some items may interact with your medicine. What should I watch for while using this medicine? Visit your doctor for  checks on your progress. This drug may make you feel generally unwell. This is not uncommon, as chemotherapy can affect healthy cells as well as cancer cells. Report any side effects. Continue your course of treatment even though you feel ill unless your doctor tells you to stop. In some cases, you may be given additional medicines to help with side effects. Follow all directions for their use. Call your doctor or health care professional for advice if you get a fever, chills or sore throat, or other symptoms of a cold or flu. Do not treat yourself. This drug decreases your body's ability to fight infections. Try to avoid being around people who are sick. This medicine may increase your risk to bruise or bleed. Call your doctor or health care professional if you notice any unusual bleeding. Do not become pregnant while taking this medicine or for at least 1 month after stopping it. Women should inform their doctor if they wish to become pregnant or think they might be pregnant. Men should not father a child while taking this medicine and for at least 2 months after stopping it. There is a potential for serious side effects to an unborn child. Talk to your health care professional or pharmacist for more information. Do not breast-feed an infant while taking this medicine. What side effects may I notice from receiving this medicine? Side effects that you should report to your doctor or health care professional as soon as possible: -low blood counts - this medicine may decrease the number of white blood cells, red blood cells and platelets. You may be at increased risk for infections and bleeding. -signs of infection - fever or   chills, cough, sore throat, pain or difficulty passing urine -signs of decreased platelets or bleeding - bruising, pinpoint red spots on the skin, black, tarry stools, blood in the urine -signs of decreased red blood cells - unusual weakness or tiredness, fainting spells,  lightheadedness -increased blood sugar Side effects that usually do not require medical attention (report to your doctor or health care professional if they continue or are bothersome): -constipation -diarrhea -headache -loss of appetite -nausea, vomiting -skin rash, itching -stomach pain -water retention -weak or tired This list may not describe all possible side effects. Call your doctor for medical advice about side effects. You may report side effects to FDA at 1-800-FDA-1088. Where should I keep my medicine? This drug is given in a hospital or clinic and will not be stored at home. NOTE: This sheet is a summary. It may not cover all possible information. If you have questions about this medicine, talk to your doctor, pharmacist, or health care provider.  2018 Elsevier/Gold Standard (2015-06-30 15:52:57)  

## 2017-06-28 ENCOUNTER — Inpatient Hospital Stay: Payer: Medicare Other

## 2017-06-28 VITALS — BP 132/65 | HR 84 | Temp 97.6°F

## 2017-06-28 DIAGNOSIS — D4621 Refractory anemia with excess of blasts 1: Secondary | ICD-10-CM | POA: Diagnosis not present

## 2017-06-28 DIAGNOSIS — D4620 Refractory anemia with excess of blasts, unspecified: Secondary | ICD-10-CM

## 2017-06-28 MED ORDER — SODIUM CHLORIDE 0.9 % IV SOLN
20.0000 mg/m2 | Freq: Once | INTRAVENOUS | Status: AC
  Administered 2017-06-28: 45 mg via INTRAVENOUS
  Filled 2017-06-28: qty 9

## 2017-06-28 MED ORDER — SODIUM CHLORIDE 0.9 % IV SOLN
Freq: Once | INTRAVENOUS | Status: AC
  Administered 2017-06-28: 10:00:00 via INTRAVENOUS

## 2017-06-28 MED ORDER — SODIUM CHLORIDE 0.9% FLUSH
10.0000 mL | INTRAVENOUS | Status: DC | PRN
  Administered 2017-06-28: 10 mL
  Filled 2017-06-28: qty 10

## 2017-06-28 MED ORDER — HEPARIN SOD (PORK) LOCK FLUSH 100 UNIT/ML IV SOLN
500.0000 [IU] | Freq: Once | INTRAVENOUS | Status: AC | PRN
  Administered 2017-06-28: 500 [IU]
  Filled 2017-06-28: qty 5

## 2017-06-28 NOTE — Progress Notes (Signed)
Pt took Compazine 10 mg PO at home.

## 2017-07-03 ENCOUNTER — Telehealth: Payer: Self-pay

## 2017-07-03 NOTE — Telephone Encounter (Signed)
NT REFERRAL TO SCHEDULING

## 2017-08-01 ENCOUNTER — Encounter: Payer: Self-pay | Admitting: Interventional Cardiology

## 2017-08-05 ENCOUNTER — Ambulatory Visit: Payer: Medicare Other | Admitting: Hematology & Oncology

## 2017-08-05 ENCOUNTER — Other Ambulatory Visit: Payer: Medicare Other

## 2017-08-05 ENCOUNTER — Ambulatory Visit: Payer: Medicare Other

## 2017-08-06 ENCOUNTER — Ambulatory Visit: Payer: Medicare Other

## 2017-08-07 ENCOUNTER — Ambulatory Visit: Payer: Medicare Other

## 2017-08-08 ENCOUNTER — Ambulatory Visit: Payer: Medicare Other | Admitting: Interventional Cardiology

## 2017-08-08 ENCOUNTER — Ambulatory Visit: Payer: Medicare Other

## 2017-08-09 ENCOUNTER — Ambulatory Visit: Payer: Medicare Other

## 2017-08-12 ENCOUNTER — Encounter: Payer: Self-pay | Admitting: Hematology & Oncology

## 2017-08-12 ENCOUNTER — Inpatient Hospital Stay: Payer: Medicare Other

## 2017-08-12 ENCOUNTER — Other Ambulatory Visit: Payer: Self-pay

## 2017-08-12 ENCOUNTER — Inpatient Hospital Stay: Payer: Medicare Other | Attending: Family | Admitting: Hematology & Oncology

## 2017-08-12 VITALS — BP 120/70 | HR 78 | Temp 98.0°F | Wt 212.1 lb

## 2017-08-12 DIAGNOSIS — D4621 Refractory anemia with excess of blasts 1: Secondary | ICD-10-CM | POA: Insufficient documentation

## 2017-08-12 DIAGNOSIS — Z79899 Other long term (current) drug therapy: Secondary | ICD-10-CM | POA: Diagnosis not present

## 2017-08-12 DIAGNOSIS — D508 Other iron deficiency anemias: Secondary | ICD-10-CM

## 2017-08-12 DIAGNOSIS — D4620 Refractory anemia with excess of blasts, unspecified: Secondary | ICD-10-CM

## 2017-08-12 LAB — CBC WITH DIFFERENTIAL (CANCER CENTER ONLY)
BASOS PCT: 0 %
Basophils Absolute: 0 10*3/uL (ref 0.0–0.1)
Eosinophils Absolute: 0.1 10*3/uL (ref 0.0–0.5)
Eosinophils Relative: 1 %
HCT: 40.1 % (ref 38.7–49.9)
HEMOGLOBIN: 12.8 g/dL — AB (ref 13.0–17.1)
LYMPHS ABS: 0.7 10*3/uL — AB (ref 0.9–3.3)
Lymphocytes Relative: 6 %
MCH: 33.2 pg (ref 28.0–33.4)
MCHC: 31.9 g/dL — AB (ref 32.0–35.9)
MCV: 103.9 fL — ABNORMAL HIGH (ref 82.0–98.0)
MONOS PCT: 8 %
Monocytes Absolute: 1 10*3/uL — ABNORMAL HIGH (ref 0.1–0.9)
NEUTROS PCT: 85 %
Neutro Abs: 11.3 10*3/uL — ABNORMAL HIGH (ref 1.5–6.5)
Platelet Count: 198 10*3/uL (ref 145–400)
RBC: 3.86 MIL/uL — ABNORMAL LOW (ref 4.20–5.70)
RDW: 15.3 % (ref 11.1–15.7)
WBC Count: 13.1 10*3/uL — ABNORMAL HIGH (ref 4.0–10.0)

## 2017-08-12 LAB — IRON AND TIBC
Iron: 82 ug/dL (ref 42–163)
Saturation Ratios: 29 % — ABNORMAL LOW (ref 42–163)
TIBC: 279 ug/dL (ref 202–409)
UIBC: 197 ug/dL

## 2017-08-12 LAB — CMP (CANCER CENTER ONLY)
ALT: 22 U/L (ref 10–47)
ANION GAP: 6 (ref 5–15)
AST: 22 U/L (ref 11–38)
Albumin: 3.6 g/dL (ref 3.5–5.0)
Alkaline Phosphatase: 49 U/L (ref 26–84)
BUN: 26 mg/dL — ABNORMAL HIGH (ref 7–22)
CHLORIDE: 104 mmol/L (ref 98–108)
CO2: 30 mmol/L (ref 18–33)
Calcium: 9.1 mg/dL (ref 8.0–10.3)
Creatinine: 1.7 mg/dL — ABNORMAL HIGH (ref 0.60–1.20)
GLUCOSE: 103 mg/dL (ref 73–118)
POTASSIUM: 4.1 mmol/L (ref 3.3–4.7)
Sodium: 140 mmol/L (ref 128–145)
Total Bilirubin: 0.6 mg/dL (ref 0.2–1.6)
Total Protein: 6.8 g/dL (ref 6.4–8.1)

## 2017-08-12 LAB — SAVE SMEAR

## 2017-08-12 LAB — FERRITIN: FERRITIN: 1978 ng/mL — AB (ref 22–316)

## 2017-08-12 MED ORDER — SODIUM CHLORIDE 0.9 % IV SOLN
20.0000 mg/m2 | Freq: Once | INTRAVENOUS | Status: AC
  Administered 2017-08-12: 45 mg via INTRAVENOUS
  Filled 2017-08-12: qty 9

## 2017-08-12 MED ORDER — PROCHLORPERAZINE MALEATE 10 MG PO TABS
10.0000 mg | ORAL_TABLET | Freq: Once | ORAL | Status: DC
Start: 2017-08-12 — End: 2017-08-12

## 2017-08-12 MED ORDER — SODIUM CHLORIDE 0.9 % IV SOLN
Freq: Once | INTRAVENOUS | Status: AC
  Administered 2017-08-12: 11:00:00 via INTRAVENOUS

## 2017-08-12 MED ORDER — HEPARIN SOD (PORK) LOCK FLUSH 100 UNIT/ML IV SOLN
500.0000 [IU] | Freq: Once | INTRAVENOUS | Status: AC | PRN
  Administered 2017-08-12: 500 [IU]
  Filled 2017-08-12: qty 5

## 2017-08-12 MED ORDER — SODIUM CHLORIDE 0.9% FLUSH
10.0000 mL | INTRAVENOUS | Status: DC | PRN
  Administered 2017-08-12: 10 mL
  Filled 2017-08-12: qty 10

## 2017-08-12 NOTE — Patient Instructions (Signed)
Implanted Port Insertion, Care After °This sheet gives you information about how to care for yourself after your procedure. Your health care provider may also give you more specific instructions. If you have problems or questions, contact your health care provider. °What can I expect after the procedure? °After your procedure, it is common to have: °· Discomfort at the port insertion site. °· Bruising on the skin over the port. This should improve over 3-4 days. ° °Follow these instructions at home: °Port care °· After your port is placed, you will get a manufacturer's information card. The card has information about your port. Keep this card with you at all times. °· Take care of the port as told by your health care provider. Ask your health care provider if you or a family member can get training for taking care of the port at home. A home health care nurse may also take care of the port. °· Make sure to remember what type of port you have. °Incision care °· Follow instructions from your health care provider about how to take care of your port insertion site. Make sure you: °? Wash your hands with soap and water before you change your bandage (dressing). If soap and water are not available, use hand sanitizer. °? Change your dressing as told by your health care provider. °? Leave stitches (sutures), skin glue, or adhesive strips in place. These skin closures may need to stay in place for 2 weeks or longer. If adhesive strip edges start to loosen and curl up, you may trim the loose edges. Do not remove adhesive strips completely unless your health care provider tells you to do that. °· Check your port insertion site every day for signs of infection. Check for: °? More redness, swelling, or pain. °? More fluid or blood. °? Warmth. °? Pus or a bad smell. °General instructions °· Do not take baths, swim, or use a hot tub until your health care provider approves. °· Do not lift anything that is heavier than 10 lb (4.5  kg) for a week, or as told by your health care provider. °· Ask your health care provider when it is okay to: °? Return to work or school. °? Resume usual physical activities or sports. °· Do not drive for 24 hours if you were given a medicine to help you relax (sedative). °· Take over-the-counter and prescription medicines only as told by your health care provider. °· Wear a medical alert bracelet in case of an emergency. This will tell any health care providers that you have a port. °· Keep all follow-up visits as told by your health care provider. This is important. °Contact a health care provider if: °· You cannot flush your port with saline as directed, or you cannot draw blood from the port. °· You have a fever or chills. °· You have more redness, swelling, or pain around your port insertion site. °· You have more fluid or blood coming from your port insertion site. °· Your port insertion site feels warm to the touch. °· You have pus or a bad smell coming from the port insertion site. °Get help right away if: °· You have chest pain or shortness of breath. °· You have bleeding from your port that you cannot control. °Summary °· Take care of the port as told by your health care provider. °· Change your dressing as told by your health care provider. °· Keep all follow-up visits as told by your health care provider. °  This information is not intended to replace advice given to you by your health care provider. Make sure you discuss any questions you have with your health care provider. °Document Released: 03/18/2013 Document Revised: 04/18/2016 Document Reviewed: 04/18/2016 °Elsevier Interactive Patient Education © 2017 [employer] ° °

## 2017-08-12 NOTE — Progress Notes (Signed)
Ok to treat with creatinine 1.7 per Dr. Marin Olp.

## 2017-08-12 NOTE — Progress Notes (Signed)
Hematology and Oncology Follow Up Visit  IOANNIS SCHUH [id number] May 31, 1941 77 y.o. 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 Hilbert is here today with his wife for follow-up and treatment.   He is feeling pretty good.  He had a fairly decent weekend.  There is been no problems with nausea or vomiting.  He still has a little bit of dizziness.  He still has little bit of on steadiness when he walks.  He does use a cane.  He has had no fever.  He has had no bleeding.  There is been no change in bowel or bladder habits.  Hange in medications.  His appetite has been quite good.  His iron studies at 7 doing pretty well.  His ferritin is 1978.  His iron saturation is only 29%.  Overall, his performance status is ECOG 1.  Medications:  Allergies as of 08/12/2017   No Known Allergies     Medication List        Accurate as of 08/12/17 11:03 AM. Always use your most recent med list.          bimatoprost 0.01 % Soln Commonly known as:  LUMIGAN Place 1 drop into both eyes at bedtime.   budesonide-formoterol 160-4.5 MCG/ACT inhaler Commonly known as:  SYMBICORT Inhale 2 puffs into the lungs 2 (two) times daily.   buPROPion 150 MG 24 hr tablet Commonly known as:  WELLBUTRIN XL Take 150 mg by mouth daily.   clopidogrel 75 MG tablet Commonly known as:  PLAVIX Take 75 mg by mouth daily.   co-enzyme Q-10 30 MG capsule Take 100 mg by mouth daily.   Cyanocobalamin 2500 MCG Tabs Take 5,000 mcg by mouth daily.   fish oil-omega-3 fatty acids 1000 MG capsule Take 1 g by mouth daily.   glucosamine-chondroitin 500-400 MG tablet Take 1 tablet by mouth daily.   LORazepam  0.5 MG tablet Commonly known as:  ATIVAN Take 1 tablet (0.5 mg total) by mouth every 6 (six) hours as needed (Nausea or vomiting).   magnesium oxide 400 MG tablet Commonly known as:  MAG-OX Take 400 mg by mouth daily.   orphenadrine 100 MG tablet Commonly known as:  NORFLEX Take 1 tablet (100 mg total) by mouth 2 (two) times daily as needed for muscle spasms.   pantoprazole 40 MG tablet Commonly known as:  PROTONIX Take 40 mg by mouth daily.   predniSONE 20 MG tablet Commonly known as:  DELTASONE Take 1 tablet (20 mg total) by mouth daily with breakfast.   PROAIR RESPICLICK 409 (90 Base) MCG/ACT Aepb Generic drug:  Albuterol Sulfate Inhale 2 puffs into the lungs every 6 (six) hours as needed.   prochlorperazine 10 MG tablet Commonly known as:  COMPAZINE Take 1 tablet (10 mg total) by mouth every 6 (six) hours as needed (Nausea or vomiting).   prochlorperazine 10 MG tablet Commonly known as:  COMPAZINE Take 1 tablet (10 mg total) by mouth every 6 (six) hours as needed for nausea or vomiting.   ranitidine 300 MG tablet Commonly known as:  ZANTAC Take 300 mg by mouth at bedtime.   sertraline 100 MG  tablet Commonly known as:  ZOLOFT Take 100 mg by mouth daily.   SIMBRINZA 1-0.2 % Susp Generic drug:  Brinzolamide-Brimonidine   simvastatin 40 MG tablet Commonly known as:  ZOCOR Take 40 mg by mouth every other day.   tamsulosin 0.4 MG Caps capsule Commonly known as:  FLOMAX   tiotropium 18 MCG inhalation capsule Commonly known as:  SPIRIVA Place 18 mcg into inhaler and inhale daily.   triamcinolone cream 0.1 % Commonly known as:  KENALOG APPLY TO RASH/ITCH ON ARMS TWICE DAILY AS NEEDED   Vitamin D3 5000 units Tabs Take by mouth daily.       Allergies: No Known Allergies  Past Medical History, Surgical history, Social history, and Family History were reviewed and updated.  Review of Systems: Review of Systems  Constitutional: Negative.   HENT: Negative.    Eyes: Negative.   Respiratory: Negative.   Cardiovascular: Negative.   Gastrointestinal: Negative.   Genitourinary: Negative.   Musculoskeletal: Negative.   Skin: Negative.   Neurological: Negative.   Endo/Heme/Allergies: Negative.   Psychiatric/Behavioral: Negative.       Physical Exam:  weight is 212 lb 1.6 oz (96.2 kg). His oral temperature is 98 F (36.7 C). His blood pressure is 120/70 and his pulse is 78. His oxygen saturation is 99%.   Wt Readings from Last 3 Encounters:  08/12/17 212 lb 1.6 oz (96.2 kg)  06/24/17 220 lb 8 oz (100 kg)  03/18/17 218 lb 1.6 oz (98.9 kg)    Physical Exam  Constitutional: He is oriented to person, place, and time.  HENT:  Head: Normocephalic and atraumatic.  Mouth/Throat: Oropharynx is clear and moist.  Eyes: EOM are normal. Pupils are equal, round, and reactive to light.  Neck: Normal range of motion.  Cardiovascular: Normal rate, regular rhythm and normal heart sounds.  Pulmonary/Chest: Effort normal and breath sounds normal.  Abdominal: Soft. Bowel sounds are normal.  Musculoskeletal: Normal range of motion. He exhibits no edema, tenderness or deformity.  Lymphadenopathy:    He has no cervical adenopathy.  Neurological: He is alert and oriented to person, place, and time.  Skin: Skin is warm and dry. No rash noted. No erythema.  Psychiatric: He has a normal mood and affect. His behavior is normal. Judgment and thought content normal.  Vitals reviewed.   Lab Results  Component Value Date   WBC 13.1 (H) 08/12/2017   HGB 11.5 (L) 05/06/2017   HCT 40.1 08/12/2017   MCV 103.9 (H) 08/12/2017   PLT 198 08/12/2017   Lab Results  Component Value Date   FERRITIN 1,485 (H) 06/24/2017   IRON 57 06/24/2017   TIBC 300 06/24/2017   UIBC 244 06/24/2017   IRONPCTSAT 19 (L) 06/24/2017   Lab Results  Component Value Date   RETICCTPCT 1.9 (H) 06/24/2017   RBC 3.86 (L) 08/12/2017   RETICCTABS 39.4 05/30/2015   Lab Results    Component Value Date   KAPLAMBRATIO 1.11 12/17/2016   Lab Results  Component Value Date   IGGSERUM 735 12/17/2016   IGMSERUM 79 12/17/2016   Lab Results  Component Value Date   TOTALPROTELP 7.3 01/01/2014   ALBUMINELP 55.1 (L) 01/01/2014   A1GS 6.9 (H) 01/01/2014   A2GS 8.9 01/01/2014   BETS 7.9 (H) 01/01/2014   BETA2SER 6.7 (H) 01/01/2014   GAMS 14.5 01/01/2014   MSPIKE Not Observed 12/17/2016   SPEI * 01/01/2014     Chemistry      Component Value Date/Time   NA  143 06/24/2017 1300   NA 141 05/06/2017 0901   NA 140 04/16/2016 0930   K 4.5 06/24/2017 1300   K 3.8 05/06/2017 0901   K 3.9 04/16/2016 0930   CL 103 06/24/2017 1300   CL 105 05/06/2017 0901   CO2 28 06/24/2017 1300   CO2 28 05/06/2017 0901   CO2 24 04/16/2016 0930   BUN 23 (H) 06/24/2017 1300   BUN 27 (H) 05/06/2017 0901   BUN 20.5 04/16/2016 0930   CREATININE 1.20 06/24/2017 1300   CREATININE 1.4 (H) 05/06/2017 0901   CREATININE 1.2 04/16/2016 0930      Component Value Date/Time   CALCIUM 9.4 06/24/2017 1300   CALCIUM 8.8 05/06/2017 0901   CALCIUM 8.9 04/16/2016 0930   ALKPHOS 45 06/24/2017 1300   ALKPHOS 46 05/06/2017 0901   ALKPHOS 54 04/16/2016 0930   AST 20 06/24/2017 1300   AST 16 04/16/2016 0930   ALT 33 06/24/2017 1300   ALT 25 05/06/2017 0901   ALT 21 04/16/2016 0930   BILITOT 0.5 06/24/2017 1300   BILITOT 0.27 04/16/2016 0930      Impression and Plan: Mr. Arganbright is a very pleasant 77 yo caucasian gentleman with myelodysplasia and refractory anemia.   We are going to have to set him up with a bone marrow test.  His last one was over a year ago.  I think would be helpful to see what things are looking like.  We will do the bone marrow biopsy in April.  I will do it on April 16.  I will plan to get him back to see [place] in 6 weeks.    Volanda Napoleon, MD 3/4/201911:03 AM

## 2017-08-12 NOTE — Patient Instructions (Signed)
[facility] Discharge Instructions for Patients Receiving Chemotherapy  Today you received the following chemotherapy agents Dacogen To help prevent nausea and vomiting after your treatment, we encourage you to take your nausea medication as prescribed.   If you develop nausea and vomiting that is not controlled by your nausea medication, call the clinic.   BELOW ARE SYMPTOMS THAT SHOULD BE REPORTED IMMEDIATELY:  *FEVER GREATER THAN 100.5 F  *CHILLS WITH OR WITHOUT FEVER  NAUSEA AND VOMITING THAT IS NOT CONTROLLED WITH YOUR NAUSEA MEDICATION  *UNUSUAL SHORTNESS OF BREATH  *UNUSUAL BRUISING OR BLEEDING  TENDERNESS IN MOUTH AND THROAT WITH OR WITHOUT PRESENCE OF ULCERS  *URINARY PROBLEMS  *BOWEL PROBLEMS  UNUSUAL RASH Items with * indicate a potential emergency and should be followed up as soon as possible.  Feel free to call the clinic should you have any questions or concerns. The clinic phone number is [phone].  Please show the CHEMO ALERT CARD at check-in to the Emergency Department and triage nurse.   

## 2017-08-13 ENCOUNTER — Inpatient Hospital Stay: Payer: Medicare Other

## 2017-08-13 DIAGNOSIS — D4621 Refractory anemia with excess of blasts 1: Secondary | ICD-10-CM | POA: Diagnosis not present

## 2017-08-13 DIAGNOSIS — D4620 Refractory anemia with excess of blasts, unspecified: Secondary | ICD-10-CM

## 2017-08-13 MED ORDER — SODIUM CHLORIDE 0.9% FLUSH
10.0000 mL | INTRAVENOUS | Status: DC | PRN
  Administered 2017-08-13: 10 mL
  Filled 2017-08-13: qty 10

## 2017-08-13 MED ORDER — DECITABINE CHEMO INJECTION 50 MG
20.0000 mg/m2 | Freq: Once | INTRAVENOUS | Status: AC
  Administered 2017-08-13: 45 mg via INTRAVENOUS
  Filled 2017-08-13: qty 9

## 2017-08-13 MED ORDER — SODIUM CHLORIDE 0.9 % IV SOLN
Freq: Once | INTRAVENOUS | Status: AC
  Administered 2017-08-13: 11:00:00 via INTRAVENOUS

## 2017-08-13 MED ORDER — HEPARIN SOD (PORK) LOCK FLUSH 100 UNIT/ML IV SOLN
500.0000 [IU] | Freq: Once | INTRAVENOUS | Status: AC | PRN
  Administered 2017-08-13: 500 [IU]
  Filled 2017-08-13: qty 5

## 2017-08-13 NOTE — Patient Instructions (Signed)
Decitabine injection for infusion What is this medicine? DECITABINE (dee SYE ta been) is a chemotherapy drug. This medicine reduces the growth of cancer cells. It is used to treat adults with myelodysplastic syndromes. This medicine may be used for other purposes; ask your health care provider or pharmacist if you have questions. COMMON BRAND NAME(S): Dacogen What should I tell my health care provider before I take this medicine? They need to know if you have any of these conditions: -infection (especially a virus infection such as chickenpox, cold sores, or herpes) -kidney disease -liver disease -an unusual or allergic reaction to decitabine, other medicines, foods, dyes, or preservatives -pregnant or trying to get pregnant -breast-feeding How should I use this medicine? This medicine is for infusion into a vein. It is administered in a hospital or clinic by a doctor or health care professional. Talk to your pediatrician regarding the use of this medicine in children. Special care may be needed. Overdosage: If you think you have taken too much of this medicine contact a poison control center or emergency room at once. NOTE: This medicine is only for you. Do not share this medicine with others. What if I miss a dose? It is important not to miss your dose. Call your doctor or health care professional if you are unable to keep an appointment. What may interact with this medicine? -vaccines Talk to your doctor or health care professional before taking any of these medicines: -aspirin -acetaminophen -ibuprofen -ketoprofen -naproxen This list may not describe all possible interactions. Give your health care provider a list of all the medicines, herbs, non-prescription drugs, or dietary supplements you use. Also tell them if you smoke, drink alcohol, or use illegal drugs. Some items may interact with your medicine. What should I watch for while using this medicine? Visit your doctor for  checks on your progress. This drug may make you feel generally unwell. This is not uncommon, as chemotherapy can affect healthy cells as well as cancer cells. Report any side effects. Continue your course of treatment even though you feel ill unless your doctor tells you to stop. In some cases, you may be given additional medicines to help with side effects. Follow all directions for their use. Call your doctor or health care professional for advice if you get a fever, chills or sore throat, or other symptoms of a cold or flu. Do not treat yourself. This drug decreases your body's ability to fight infections. Try to avoid being around people who are sick. This medicine may increase your risk to bruise or bleed. Call your doctor or health care professional if you notice any unusual bleeding. Do not become pregnant while taking this medicine or for at least 1 month after stopping it. Women should inform their doctor if they wish to become pregnant or think they might be pregnant. Men should not father a child while taking this medicine and for at least 2 months after stopping it. There is a potential for serious side effects to an unborn child. Talk to your health care professional or pharmacist for more information. Do not breast-feed an infant while taking this medicine. What side effects may I notice from receiving this medicine? Side effects that you should report to your doctor or health care professional as soon as possible: -low blood counts - this medicine may decrease the number of white blood cells, red blood cells and platelets. You may be at increased risk for infections and bleeding. -signs of infection - fever or   chills, cough, sore throat, pain or difficulty passing urine -signs of decreased platelets or bleeding - bruising, pinpoint red spots on the skin, black, tarry stools, blood in the urine -signs of decreased red blood cells - unusual weakness or tiredness, fainting spells,  lightheadedness -increased blood sugar Side effects that usually do not require medical attention (report to your doctor or health care professional if they continue or are bothersome): -constipation -diarrhea -headache -loss of appetite -nausea, vomiting -skin rash, itching -stomach pain -water retention -weak or tired This list may not describe all possible side effects. Call your doctor for medical advice about side effects. You may report side effects to FDA at 1-800-FDA-1088. Where should I keep my medicine? This drug is given in a hospital or clinic and will not be stored at home. NOTE: This sheet is a summary. It may not cover all possible information. If you have questions about this medicine, talk to your doctor, pharmacist, or health care provider.  2018 Elsevier/Gold Standard (2015-06-30 15:52:57)  

## 2017-08-14 ENCOUNTER — Other Ambulatory Visit: Payer: Self-pay

## 2017-08-14 ENCOUNTER — Inpatient Hospital Stay: Payer: Medicare Other

## 2017-08-14 VITALS — BP 131/72 | HR 85 | Temp 97.4°F | Resp 20

## 2017-08-14 DIAGNOSIS — D4620 Refractory anemia with excess of blasts, unspecified: Secondary | ICD-10-CM

## 2017-08-14 DIAGNOSIS — D4621 Refractory anemia with excess of blasts 1: Secondary | ICD-10-CM | POA: Diagnosis not present

## 2017-08-14 MED ORDER — SODIUM CHLORIDE 0.9% FLUSH
10.0000 mL | INTRAVENOUS | Status: DC | PRN
  Administered 2017-08-14: 10 mL
  Filled 2017-08-14: qty 10

## 2017-08-14 MED ORDER — HEPARIN SOD (PORK) LOCK FLUSH 100 UNIT/ML IV SOLN
500.0000 [IU] | Freq: Once | INTRAVENOUS | Status: AC | PRN
  Administered 2017-08-14: 500 [IU]
  Filled 2017-08-14: qty 5

## 2017-08-14 MED ORDER — PROCHLORPERAZINE MALEATE 10 MG PO TABS: 10.0000 mg | ORAL_TABLET | Freq: Once | ORAL | Status: DC

## 2017-08-14 MED ORDER — SODIUM CHLORIDE 0.9 % IV SOLN
20.0000 mg/m2 | Freq: Once | INTRAVENOUS | Status: AC
  Administered 2017-08-14: 45 mg via INTRAVENOUS
  Filled 2017-08-14: qty 9

## 2017-08-14 MED ORDER — SODIUM CHLORIDE 0.9 % IV SOLN
Freq: Once | INTRAVENOUS | Status: AC
  Administered 2017-08-14: 10:00:00 via INTRAVENOUS

## 2017-08-14 NOTE — Patient Instructions (Signed)
[facility] Discharge Instructions for Patients Receiving Chemotherapy  Today you received the following chemotherapy agents:  Nivolumab  To help prevent nausea and vomiting after your treatment, we encourage you to take your nausea medication as prescribed.   If you develop nausea and vomiting that is not controlled by your nausea medication, call the clinic.   BELOW ARE SYMPTOMS THAT SHOULD BE REPORTED IMMEDIATELY:  *FEVER GREATER THAN 100.5 F  *CHILLS WITH OR WITHOUT FEVER  NAUSEA AND VOMITING THAT IS NOT CONTROLLED WITH YOUR NAUSEA MEDICATION  *UNUSUAL SHORTNESS OF BREATH  *UNUSUAL BRUISING OR BLEEDING  TENDERNESS IN MOUTH AND THROAT WITH OR WITHOUT PRESENCE OF ULCERS  *URINARY PROBLEMS  *BOWEL PROBLEMS  UNUSUAL RASH Items with * indicate a potential emergency and should be followed up as soon as possible.  Feel free to call the clinic should you have any questions or concerns. The clinic phone number is [phone].  Please show the CHEMO ALERT CARD at check-in to the Emergency Department and triage nurse.   

## 2017-08-15 ENCOUNTER — Inpatient Hospital Stay: Payer: Medicare Other

## 2017-08-15 VITALS — BP 119/60 | HR 79 | Temp 97.5°F

## 2017-08-15 DIAGNOSIS — D4621 Refractory anemia with excess of blasts 1: Secondary | ICD-10-CM | POA: Diagnosis not present

## 2017-08-15 DIAGNOSIS — D4620 Refractory anemia with excess of blasts, unspecified: Secondary | ICD-10-CM

## 2017-08-15 MED ORDER — SODIUM CHLORIDE 0.9 % IV SOLN
Freq: Once | INTRAVENOUS | Status: AC
  Administered 2017-08-15: 10:00:00 via INTRAVENOUS

## 2017-08-15 MED ORDER — SODIUM CHLORIDE 0.9% FLUSH
10.0000 mL | INTRAVENOUS | Status: DC | PRN
  Administered 2017-08-15: 10 mL
  Filled 2017-08-15: qty 10

## 2017-08-15 MED ORDER — HEPARIN SOD (PORK) LOCK FLUSH 100 UNIT/ML IV SOLN
500.0000 [IU] | Freq: Once | INTRAVENOUS | Status: AC | PRN
  Administered 2017-08-15: 500 [IU]
  Filled 2017-08-15: qty 5

## 2017-08-15 MED ORDER — DECITABINE CHEMO INJECTION 50 MG
20.0000 mg/m2 | Freq: Once | INTRAVENOUS | Status: AC
  Administered 2017-08-15: 45 mg via INTRAVENOUS
  Filled 2017-08-15: qty 9

## 2017-08-15 NOTE — Patient Instructions (Signed)
Decitabine injection for infusion What is this medicine? DECITABINE (dee SYE ta been) is a chemotherapy drug. This medicine reduces the growth of cancer cells. It is used to treat adults with myelodysplastic syndromes. This medicine may be used for other purposes; ask your health care provider or pharmacist if you have questions. COMMON BRAND NAME(S): Dacogen What should I tell my health care provider before I take this medicine? They need to know if you have any of these conditions: -infection (especially a virus infection such as chickenpox, cold sores, or herpes) -kidney disease -liver disease -an unusual or allergic reaction to decitabine, other medicines, foods, dyes, or preservatives -pregnant or trying to get pregnant -breast-feeding How should I use this medicine? This medicine is for infusion into a vein. It is administered in a hospital or clinic by a doctor or health care professional. Talk to your pediatrician regarding the use of this medicine in children. Special care may be needed. Overdosage: If you think you have taken too much of this medicine contact a poison control center or emergency room at once. NOTE: This medicine is only for you. Do not share this medicine with others. What if I miss a dose? It is important not to miss your dose. Call your doctor or health care professional if you are unable to keep an appointment. What may interact with this medicine? -vaccines Talk to your doctor or health care professional before taking any of these medicines: -aspirin -acetaminophen -ibuprofen -ketoprofen -naproxen This list may not describe all possible interactions. Give your health care provider a list of all the medicines, herbs, non-prescription drugs, or dietary supplements you use. Also tell them if you smoke, drink alcohol, or use illegal drugs. Some items may interact with your medicine. What should I watch for while using this medicine? Visit your doctor for  checks on your progress. This drug may make you feel generally unwell. This is not uncommon, as chemotherapy can affect healthy cells as well as cancer cells. Report any side effects. Continue your course of treatment even though you feel ill unless your doctor tells you to stop. In some cases, you may be given additional medicines to help with side effects. Follow all directions for their use. Call your doctor or health care professional for advice if you get a fever, chills or sore throat, or other symptoms of a cold or flu. Do not treat yourself. This drug decreases your body's ability to fight infections. Try to avoid being around people who are sick. This medicine may increase your risk to bruise or bleed. Call your doctor or health care professional if you notice any unusual bleeding. Do not become pregnant while taking this medicine or for at least 1 month after stopping it. Women should inform their doctor if they wish to become pregnant or think they might be pregnant. Men should not father a child while taking this medicine and for at least 2 months after stopping it. There is a potential for serious side effects to an unborn child. Talk to your health care professional or pharmacist for more information. Do not breast-feed an infant while taking this medicine. What side effects may I notice from receiving this medicine? Side effects that you should report to your doctor or health care professional as soon as possible: -low blood counts - this medicine may decrease the number of white blood cells, red blood cells and platelets. You may be at increased risk for infections and bleeding. -signs of infection - fever or   chills, cough, sore throat, pain or difficulty passing urine -signs of decreased platelets or bleeding - bruising, pinpoint red spots on the skin, black, tarry stools, blood in the urine -signs of decreased red blood cells - unusual weakness or tiredness, fainting spells,  lightheadedness -increased blood sugar Side effects that usually do not require medical attention (report to your doctor or health care professional if they continue or are bothersome): -constipation -diarrhea -headache -loss of appetite -nausea, vomiting -skin rash, itching -stomach pain -water retention -weak or tired This list may not describe all possible side effects. Call your doctor for medical advice about side effects. You may report side effects to FDA at 1-800-FDA-1088. Where should I keep my medicine? This drug is given in a hospital or clinic and will not be stored at home. NOTE: This sheet is a summary. It may not cover all possible information. If you have questions about this medicine, talk to your doctor, pharmacist, or health care provider.  2018 Elsevier/Gold Standard (2015-06-30 15:52:57)  

## 2017-08-15 NOTE — Progress Notes (Signed)
Pt took Compazine PTA at 0900. dph

## 2017-08-16 ENCOUNTER — Other Ambulatory Visit: Payer: Self-pay

## 2017-08-16 ENCOUNTER — Inpatient Hospital Stay: Payer: Medicare Other

## 2017-08-16 VITALS — BP 120/46 | HR 79 | Temp 97.6°F | Resp 20

## 2017-08-16 DIAGNOSIS — D4620 Refractory anemia with excess of blasts, unspecified: Secondary | ICD-10-CM

## 2017-08-16 DIAGNOSIS — D4621 Refractory anemia with excess of blasts 1: Secondary | ICD-10-CM | POA: Diagnosis not present

## 2017-08-16 MED ORDER — SODIUM CHLORIDE 0.9% FLUSH
10.0000 mL | INTRAVENOUS | Status: DC | PRN
  Administered 2017-08-16: 10 mL
  Filled 2017-08-16: qty 10

## 2017-08-16 MED ORDER — HEPARIN SOD (PORK) LOCK FLUSH 100 UNIT/ML IV SOLN
500.0000 [IU] | Freq: Once | INTRAVENOUS | Status: AC | PRN
  Administered 2017-08-16: 500 [IU]
  Filled 2017-08-16: qty 5

## 2017-08-16 MED ORDER — SODIUM CHLORIDE 0.9 % IV SOLN
20.0000 mg/m2 | Freq: Once | INTRAVENOUS | Status: AC
  Administered 2017-08-16: 45 mg via INTRAVENOUS
  Filled 2017-08-16: qty 9

## 2017-08-16 MED ORDER — PROCHLORPERAZINE MALEATE 10 MG PO TABS: 10.0000 mg | ORAL_TABLET | Freq: Once | ORAL | Status: DC

## 2017-08-16 MED ORDER — SODIUM CHLORIDE 0.9 % IV SOLN
Freq: Once | INTRAVENOUS | Status: AC
  Administered 2017-08-16: 10:00:00 via INTRAVENOUS

## 2017-08-16 NOTE — Patient Instructions (Signed)
Decitabine injection for infusion What is this medicine? DECITABINE (dee SYE ta been) is a chemotherapy drug. This medicine reduces the growth of cancer cells. It is used to treat adults with myelodysplastic syndromes. This medicine may be used for other purposes; ask your health care provider or pharmacist if you have questions. COMMON BRAND NAME(S): Dacogen What should I tell my health care provider before I take this medicine? They need to know if you have any of these conditions: -infection (especially a virus infection such as chickenpox, cold sores, or herpes) -kidney disease -liver disease -an unusual or allergic reaction to decitabine, other medicines, foods, dyes, or preservatives -pregnant or trying to get pregnant -breast-feeding How should I use this medicine? This medicine is for infusion into a vein. It is administered in a hospital or clinic by a doctor or health care professional. Talk to your pediatrician regarding the use of this medicine in children. Special care may be needed. Overdosage: If you think you have taken too much of this medicine contact a poison control center or emergency room at once. NOTE: This medicine is only for you. Do not share this medicine with others. What if I miss a dose? It is important not to miss your dose. Call your doctor or health care professional if you are unable to keep an appointment. What may interact with this medicine? -vaccines Talk to your doctor or health care professional before taking any of these medicines: -aspirin -acetaminophen -ibuprofen -ketoprofen -naproxen This list may not describe all possible interactions. Give your health care provider a list of all the medicines, herbs, non-prescription drugs, or dietary supplements you use. Also tell them if you smoke, drink alcohol, or use illegal drugs. Some items may interact with your medicine. What should I watch for while using this medicine? Visit your doctor for  checks on your progress. This drug may make you feel generally unwell. This is not uncommon, as chemotherapy can affect healthy cells as well as cancer cells. Report any side effects. Continue your course of treatment even though you feel ill unless your doctor tells you to stop. In some cases, you may be given additional medicines to help with side effects. Follow all directions for their use. Call your doctor or health care professional for advice if you get a fever, chills or sore throat, or other symptoms of a cold or flu. Do not treat yourself. This drug decreases your body's ability to fight infections. Try to avoid being around people who are sick. This medicine may increase your risk to bruise or bleed. Call your doctor or health care professional if you notice any unusual bleeding. Do not become pregnant while taking this medicine or for at least 1 month after stopping it. Women should inform their doctor if they wish to become pregnant or think they might be pregnant. Men should not father a child while taking this medicine and for at least 2 months after stopping it. There is a potential for serious side effects to an unborn child. Talk to your health care professional or pharmacist for more information. Do not breast-feed an infant while taking this medicine. What side effects may I notice from receiving this medicine? Side effects that you should report to your doctor or health care professional as soon as possible: -low blood counts - this medicine may decrease the number of white blood cells, red blood cells and platelets. You may be at increased risk for infections and bleeding. -signs of infection - fever or   chills, cough, sore throat, pain or difficulty passing urine -signs of decreased platelets or bleeding - bruising, pinpoint red spots on the skin, black, tarry stools, blood in the urine -signs of decreased red blood cells - unusual weakness or tiredness, fainting spells,  lightheadedness -increased blood sugar Side effects that usually do not require medical attention (report to your doctor or health care professional if they continue or are bothersome): -constipation -diarrhea -headache -loss of appetite -nausea, vomiting -skin rash, itching -stomach pain -water retention -weak or tired This list may not describe all possible side effects. Call your doctor for medical advice about side effects. You may report side effects to FDA at 1-800-FDA-1088. Where should I keep my medicine? This drug is given in a hospital or clinic and will not be stored at home. NOTE: This sheet is a summary. It may not cover all possible information. If you have questions about this medicine, talk to your doctor, pharmacist, or health care provider.  2018 Elsevier/Gold Standard (2015-06-30 15:52:57)  

## 2017-08-22 ENCOUNTER — Ambulatory Visit: Payer: Medicare Other | Admitting: Cardiology

## 2017-09-23 ENCOUNTER — Telehealth: Payer: Self-pay | Admitting: *Deleted

## 2017-09-23 NOTE — Telephone Encounter (Signed)
Patient's wife notifying the office that patient has injured his back and will reschedule his bone marrow biopsy planned for tomorrow.   They will follow up with his planned treatment next week, and reschedule the bone marrow for a later time.  Dr Marin Olp is aware.

## 2017-09-24 ENCOUNTER — Inpatient Hospital Stay: Payer: Medicare Other | Admitting: Hematology & Oncology

## 2017-09-30 ENCOUNTER — Inpatient Hospital Stay: Payer: Medicare Other

## 2017-09-30 ENCOUNTER — Inpatient Hospital Stay: Payer: Medicare Other | Admitting: Hematology & Oncology

## 2017-10-01 ENCOUNTER — Ambulatory Visit: Payer: Medicare Other

## 2017-10-02 ENCOUNTER — Ambulatory Visit: Payer: Medicare Other

## 2017-10-03 ENCOUNTER — Ambulatory Visit: Payer: Medicare Other

## 2017-10-04 ENCOUNTER — Ambulatory Visit: Payer: Medicare Other

## 2017-11-11 ENCOUNTER — Inpatient Hospital Stay: Payer: Medicare Other | Attending: Family

## 2017-11-11 ENCOUNTER — Telehealth: Payer: Self-pay | Admitting: Hematology & Oncology

## 2017-11-11 ENCOUNTER — Inpatient Hospital Stay: Payer: Medicare Other

## 2017-11-11 ENCOUNTER — Inpatient Hospital Stay (HOSPITAL_BASED_OUTPATIENT_CLINIC_OR_DEPARTMENT_OTHER): Payer: Medicare Other | Admitting: Hematology & Oncology

## 2017-11-11 ENCOUNTER — Encounter: Payer: Self-pay | Admitting: Hematology & Oncology

## 2017-11-11 ENCOUNTER — Other Ambulatory Visit: Payer: Self-pay

## 2017-11-11 ENCOUNTER — Other Ambulatory Visit: Payer: Self-pay | Admitting: *Deleted

## 2017-11-11 VITALS — BP 132/67 | HR 94 | Temp 98.1°F | Resp 20 | Wt 206.0 lb

## 2017-11-11 DIAGNOSIS — R110 Nausea: Secondary | ICD-10-CM

## 2017-11-11 DIAGNOSIS — F419 Anxiety disorder, unspecified: Secondary | ICD-10-CM

## 2017-11-11 DIAGNOSIS — D500 Iron deficiency anemia secondary to blood loss (chronic): Secondary | ICD-10-CM

## 2017-11-11 DIAGNOSIS — D4621 Refractory anemia with excess of blasts 1: Secondary | ICD-10-CM | POA: Diagnosis present

## 2017-11-11 DIAGNOSIS — D4620 Refractory anemia with excess of blasts, unspecified: Secondary | ICD-10-CM

## 2017-11-11 DIAGNOSIS — S32010G Wedge compression fracture of first lumbar vertebra, subsequent encounter for fracture with delayed healing: Secondary | ICD-10-CM

## 2017-11-11 DIAGNOSIS — D46Z Other myelodysplastic syndromes: Secondary | ICD-10-CM

## 2017-11-11 DIAGNOSIS — Z79899 Other long term (current) drug therapy: Secondary | ICD-10-CM | POA: Diagnosis not present

## 2017-11-11 DIAGNOSIS — D508 Other iron deficiency anemias: Secondary | ICD-10-CM

## 2017-11-11 LAB — IRON AND TIBC
IRON: 115 ug/dL (ref 42–163)
Saturation Ratios: 42 % (ref 42–163)
TIBC: 271 ug/dL (ref 202–409)
UIBC: 156 ug/dL

## 2017-11-11 LAB — CBC WITH DIFFERENTIAL (CANCER CENTER ONLY)
BASOS PCT: 0 %
Basophils Absolute: 0 10*3/uL (ref 0.0–0.1)
Eosinophils Absolute: 0.1 10*3/uL (ref 0.0–0.5)
Eosinophils Relative: 1 %
HEMATOCRIT: 36.1 % — AB (ref 38.7–49.9)
HEMOGLOBIN: 11.4 g/dL — AB (ref 13.0–17.1)
LYMPHS ABS: 1.1 10*3/uL (ref 0.9–3.3)
LYMPHS PCT: 12 %
MCH: 33.3 pg (ref 28.0–33.4)
MCHC: 31.6 g/dL — ABNORMAL LOW (ref 32.0–35.9)
MCV: 105.6 fL — AB (ref 82.0–98.0)
MONO ABS: 1.1 10*3/uL — AB (ref 0.1–0.9)
MONOS PCT: 12 %
NEUTROS ABS: 7.1 10*3/uL — AB (ref 1.5–6.5)
NEUTROS PCT: 75 %
Platelet Count: 187 10*3/uL (ref 145–400)
RBC: 3.42 MIL/uL — ABNORMAL LOW (ref 4.20–5.70)
RDW: 17.8 % — AB (ref 11.1–15.7)
WBC Count: 9.4 10*3/uL (ref 4.0–10.0)

## 2017-11-11 LAB — CMP (CANCER CENTER ONLY)
ALBUMIN: 3.2 g/dL — AB (ref 3.5–5.0)
ALT: 21 U/L (ref 10–47)
ANION GAP: 9 (ref 5–15)
AST: 21 U/L (ref 11–38)
Alkaline Phosphatase: 81 U/L (ref 26–84)
BILIRUBIN TOTAL: 0.6 mg/dL (ref 0.2–1.6)
BUN: 25 mg/dL — ABNORMAL HIGH (ref 7–22)
CALCIUM: 8.9 mg/dL (ref 8.0–10.3)
CHLORIDE: 107 mmol/L (ref 98–108)
CO2: 27 mmol/L (ref 18–33)
CREATININE: 1.3 mg/dL — AB (ref 0.60–1.20)
Glucose, Bld: 123 mg/dL — ABNORMAL HIGH (ref 73–118)
Potassium: 4 mmol/L (ref 3.3–4.7)
Sodium: 143 mmol/L (ref 128–145)
Total Protein: 6.5 g/dL (ref 6.4–8.1)

## 2017-11-11 LAB — FERRITIN: FERRITIN: 1619 ng/mL — AB (ref 22–316)

## 2017-11-11 LAB — LACTATE DEHYDROGENASE: LDH: 200 U/L (ref 125–245)

## 2017-11-11 LAB — RETICULOCYTES
RBC.: 3.48 MIL/uL — ABNORMAL LOW (ref 4.20–5.82)
RETIC COUNT ABSOLUTE: 66.1 10*3/uL (ref 34.8–93.9)
Retic Ct Pct: 1.9 % — ABNORMAL HIGH (ref 0.8–1.8)

## 2017-11-11 MED ORDER — SODIUM CHLORIDE 0.9% FLUSH
10.0000 mL | INTRAVENOUS | Status: DC | PRN
  Administered 2017-11-11: 10 mL
  Filled 2017-11-11: qty 10

## 2017-11-11 MED ORDER — PROCHLORPERAZINE MALEATE 10 MG PO TABS: 10.0000 mg | ORAL_TABLET | Freq: Four times a day (QID) | ORAL | 3 refills | Status: DC | PRN

## 2017-11-11 MED ORDER — HEPARIN SOD (PORK) LOCK FLUSH 100 UNIT/ML IV SOLN
500.0000 [IU] | Freq: Once | INTRAVENOUS | Status: AC | PRN
  Administered 2017-11-11: 500 [IU]
  Filled 2017-11-11: qty 5

## 2017-11-11 MED ORDER — DECITABINE CHEMO INJECTION 50 MG
20.0000 mg/m2 | Freq: Once | INTRAVENOUS | Status: AC
  Administered 2017-11-11: 45 mg via INTRAVENOUS
  Filled 2017-11-11: qty 9

## 2017-11-11 MED ORDER — LORAZEPAM 0.5 MG PO TABS: 0.5000 mg | ORAL_TABLET | Freq: Four times a day (QID) | ORAL | 0 refills | Status: DC | PRN

## 2017-11-11 MED ORDER — SODIUM CHLORIDE 0.9 % IV SOLN
Freq: Once | INTRAVENOUS | Status: AC
  Administered 2017-11-11: 11:00:00 via INTRAVENOUS

## 2017-11-11 MED FILL — LORazepam 0.5 MG TABS: 0.5 | 23 days supply | Qty: 90 | Fill #0

## 2017-11-11 MED FILL — PROCHLORPERAZINE 10 MG TAB: 10 | 15 days supply | Qty: 60 | Fill #0

## 2017-11-11 NOTE — Patient Instructions (Signed)
Decitabine injection for infusion What is this medicine? DECITABINE (dee SYE ta been) is a chemotherapy drug. This medicine reduces the growth of cancer cells. It is used to treat adults with myelodysplastic syndromes. This medicine may be used for other purposes; ask your health care provider or pharmacist if you have questions. COMMON BRAND NAME(S): Dacogen What should I tell my health care provider before I take this medicine? They need to know if you have any of these conditions: -infection (especially a virus infection such as chickenpox, cold sores, or herpes) -kidney disease -liver disease -an unusual or allergic reaction to decitabine, other medicines, foods, dyes, or preservatives -pregnant or trying to get pregnant -breast-feeding How should I use this medicine? This medicine is for infusion into a vein. It is administered in a hospital or clinic by a doctor or health care professional. Talk to your pediatrician regarding the use of this medicine in children. Special care may be needed. Overdosage: If you think you have taken too much of this medicine contact a poison control center or emergency room at once. NOTE: This medicine is only for you. Do not share this medicine with others. What if I miss a dose? It is important not to miss your dose. Call your doctor or health care professional if you are unable to keep an appointment. What may interact with this medicine? -vaccines Talk to your doctor or health care professional before taking any of these medicines: -aspirin -acetaminophen -ibuprofen -ketoprofen -naproxen This list may not describe all possible interactions. Give your health care provider a list of all the medicines, herbs, non-prescription drugs, or dietary supplements you use. Also tell them if you smoke, drink alcohol, or use illegal drugs. Some items may interact with your medicine. What should I watch for while using this medicine? Visit your doctor for  checks on your progress. This drug may make you feel generally unwell. This is not uncommon, as chemotherapy can affect healthy cells as well as cancer cells. Report any side effects. Continue your course of treatment even though you feel ill unless your doctor tells you to stop. In some cases, you may be given additional medicines to help with side effects. Follow all directions for their use. Call your doctor or health care professional for advice if you get a fever, chills or sore throat, or other symptoms of a cold or flu. Do not treat yourself. This drug decreases your body's ability to fight infections. Try to avoid being around people who are sick. This medicine may increase your risk to bruise or bleed. Call your doctor or health care professional if you notice any unusual bleeding. Do not become pregnant while taking this medicine or for at least 1 month after stopping it. Women should inform their doctor if they wish to become pregnant or think they might be pregnant. Men should not father a child while taking this medicine and for at least 2 months after stopping it. There is a potential for serious side effects to an unborn child. Talk to your health care professional or pharmacist for more information. Do not breast-feed an infant while taking this medicine. What side effects may I notice from receiving this medicine? Side effects that you should report to your doctor or health care professional as soon as possible: -low blood counts - this medicine may decrease the number of white blood cells, red blood cells and platelets. You may be at increased risk for infections and bleeding. -signs of infection - fever or   chills, cough, sore throat, pain or difficulty passing urine -signs of decreased platelets or bleeding - bruising, pinpoint red spots on the skin, black, tarry stools, blood in the urine -signs of decreased red blood cells - unusual weakness or tiredness, fainting spells,  lightheadedness -increased blood sugar Side effects that usually do not require medical attention (report to your doctor or health care professional if they continue or are bothersome): -constipation -diarrhea -headache -loss of appetite -nausea, vomiting -skin rash, itching -stomach pain -water retention -weak or tired This list may not describe all possible side effects. Call your doctor for medical advice about side effects. You may report side effects to FDA at 1-800-FDA-1088. Where should I keep my medicine? This drug is given in a hospital or clinic and will not be stored at home. NOTE: This sheet is a summary. It may not cover all possible information. If you have questions about this medicine, talk to your doctor, pharmacist, or health care provider.  2018 Elsevier/Gold Standard (2015-06-30 15:52:57)  

## 2017-11-11 NOTE — Progress Notes (Signed)
Hematology and Oncology Follow Up Visit  Anthony [name] [id number] 1941-03-07 77 y.o. 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 is here today with his wife for follow-up and treatment.   Unfortunately, he has had a lot of problems since we last saw him.  He apparently turned his lumbar over in a ditch.  He sustained for compression fractures in his lower back.  He did not need surgery.  He is now getting some physical therapy.  This was so severe that he was really not able to make it in to the office for several weeks because he could not sit and walk.  We had to postpone his bone marrow test.  I do not think this was that big of a deal.  He feels well.  He has had no problems with nausea or vomiting.  He has had no change in bowel or bladder habits.  He has had no leg swelling.  He has had no fever.  His last iron studies back in March showed a ferritin of 2000 with iron saturation of only 29%.  Medications:  Allergies as of 11/11/2017   No Known Allergies     Medication List        Accurate as of 11/11/17  9:44 AM. Always use your most recent med list.          bimatoprost 0.01 % Soln Commonly known as:  LUMIGAN Place 1 drop into both eyes at bedtime.   budesonide-formoterol 160-4.5 MCG/ACT inhaler Commonly known as:  SYMBICORT Inhale 2 puffs into the lungs 2 (two) times daily.   buPROPion 150 MG 24 hr tablet Commonly known as:  WELLBUTRIN XL Take 150 mg by mouth daily.   clopidogrel 75 MG tablet Commonly known as:  PLAVIX Take 75 mg by mouth daily.   co-enzyme Q-10 30 MG capsule Take 100 mg by mouth daily.   Cyanocobalamin 2500 MCG Tabs Take 5,000  mcg by mouth daily.   fish oil-omega-3 fatty acids 1000 MG capsule Take 1 g by mouth daily.   glucosamine-chondroitin 500-400 MG tablet Take 1 tablet by mouth daily.   LORazepam 0.5 MG tablet Commonly known as:  ATIVAN Take 1 tablet (0.5 mg total) by mouth every 6 (six) hours as needed (Nausea or vomiting).   magnesium oxide 400 MG tablet Commonly known as:  MAG-OX Take 400 mg by mouth daily.   Menthol (Topical Analgesic) 2.5 % Gel Apply topically.   orphenadrine 100 MG tablet Commonly known as:  NORFLEX Take 1 tablet (100 mg total) by mouth 2 (two) times daily as needed for muscle spasms.   pantoprazole 40 MG tablet Commonly known as:  PROTONIX Take 40 mg by mouth daily.   predniSONE 20 MG tablet Commonly known as:  DELTASONE Take 1 tablet (20 mg total) by mouth daily with breakfast.   PROAIR RESPICLICK 932 (90 Base) MCG/ACT Aepb Generic drug:  Albuterol Sulfate Inhale 2 puffs into the lungs every 6 (six) hours as needed.   prochlorperazine 10 MG tablet Commonly known as:  COMPAZINE Take 1 tablet (10 mg total) by mouth every 6 (six) hours as needed (Nausea or vomiting).  ranitidine 300 MG tablet Commonly known as:  ZANTAC Take 300 mg by mouth at bedtime.   sertraline 100 MG tablet Commonly known as:  ZOLOFT Take 100 mg by mouth daily.   SIMBRINZA 1-0.2 % Susp Generic drug:  Brinzolamide-Brimonidine   simvastatin 40 MG tablet Commonly known as:  ZOCOR Take 40 mg by mouth every other day.   tamsulosin 0.4 MG Caps capsule Commonly known as:  FLOMAX   tiotropium 18 MCG inhalation capsule Commonly known as:  SPIRIVA Place 18 mcg into inhaler and inhale daily.   triamcinolone cream 0.1 % Commonly known as:  KENALOG APPLY TO RASH/ITCH ON ARMS TWICE DAILY AS NEEDED   Vitamin D3 5000 units Tabs Take by mouth daily.       Allergies: No Known Allergies  Past Medical History, Surgical history, Social history, and Family History were reviewed and  updated.  Review of Systems: Review of Systems  Constitutional: Negative.   HENT: Negative.   Eyes: Negative.   Respiratory: Negative.   Cardiovascular: Negative.   Gastrointestinal: Negative.   Genitourinary: Negative.   Musculoskeletal: Negative.   Skin: Negative.   Neurological: Negative.   Endo/Heme/Allergies: Negative.   Psychiatric/Behavioral: Negative.       Physical Exam:  weight is 206 lb (93.4 kg). His oral temperature is 98.1 F (36.7 C). His blood pressure is 132/67 and his pulse is 94. His respiration is 20 and oxygen saturation is 97%.   Wt Readings from Last 3 Encounters:  11/11/17 206 lb (93.4 kg)  08/12/17 212 lb 1.6 oz (96.2 kg)  06/24/17 220 lb 8 oz (100 kg)    Physical Exam  Constitutional: He is oriented to person, place, and time.  HENT:  Head: Normocephalic and atraumatic.  Mouth/Throat: Oropharynx is clear and moist.  Eyes: Pupils are equal, round, and reactive to light. EOM are normal.  Neck: Normal range of motion.  Cardiovascular: Normal rate, regular rhythm and normal heart sounds.  Pulmonary/Chest: Effort normal and breath sounds normal.  Abdominal: Soft. Bowel sounds are normal.  Musculoskeletal: Normal range of motion. He exhibits no edema, tenderness or deformity.  Lymphadenopathy:    He has no cervical adenopathy.  Neurological: He is alert and oriented to person, place, and time.  Skin: Skin is warm and dry. No rash noted. No erythema.  Psychiatric: He has a normal mood and affect. His behavior is normal. Judgment and thought content normal.  Vitals reviewed.   Lab Results  Component Value Date   WBC 13.1 (H) 08/12/2017   HGB 12.8 (L) 08/12/2017   HCT 40.1 08/12/2017   MCV 103.9 (H) 08/12/2017   PLT 198 08/12/2017   Lab Results  Component Value Date   FERRITIN 1,978 (H) 08/12/2017   IRON 82 08/12/2017   TIBC 279 08/12/2017   UIBC 197 08/12/2017   IRONPCTSAT 29 (L) 08/12/2017   Lab Results  Component Value Date    RETICCTPCT 1.9 (H) 06/24/2017   RBC 3.86 (L) 08/12/2017   RETICCTABS 39.4 05/30/2015   Lab Results  Component Value Date   KAPLAMBRATIO 1.11 12/17/2016   Lab Results  Component Value Date   IGGSERUM 735 12/17/2016   IGMSERUM 79 12/17/2016   Lab Results  Component Value Date   TOTALPROTELP 7.3 01/01/2014   ALBUMINELP 55.1 (L) 01/01/2014   A1GS 6.9 (H) 01/01/2014   A2GS 8.9 01/01/2014   BETS 7.9 (H) 01/01/2014   BETA2SER 6.7 (H) 01/01/2014   GAMS 14.5 01/01/2014   MSPIKE Not Observed 12/17/2016  SPEI * 01/01/2014     Chemistry      Component Value Date/Time   NA 140 08/12/2017 1030   NA 141 05/06/2017 0901   NA 140 04/16/2016 0930   K 4.1 08/12/2017 1030   K 3.8 05/06/2017 0901   K 3.9 04/16/2016 0930   CL 104 08/12/2017 1030   CL 105 05/06/2017 0901   CO2 30 08/12/2017 1030   CO2 28 05/06/2017 0901   CO2 24 04/16/2016 0930   BUN 26 (H) 08/12/2017 1030   BUN 27 (H) 05/06/2017 0901   BUN 20.5 04/16/2016 0930   CREATININE 1.70 (H) 08/12/2017 1030   CREATININE 1.4 (H) 05/06/2017 0901   CREATININE 1.2 04/16/2016 0930      Component Value Date/Time   CALCIUM 9.1 08/12/2017 1030   CALCIUM 8.8 05/06/2017 0901   CALCIUM 8.9 04/16/2016 0930   ALKPHOS 49 08/12/2017 1030   ALKPHOS 46 05/06/2017 0901   ALKPHOS 54 04/16/2016 0930   AST 22 08/12/2017 1030   AST 16 04/16/2016 0930   ALT 22 08/12/2017 1030   ALT 25 05/06/2017 0901   ALT 21 04/16/2016 0930   BILITOT 0.6 08/12/2017 1030   BILITOT 0.27 04/16/2016 0930      Impression and Plan: Mr. Behnken is a very pleasant 77 yo caucasian gentleman with myelodysplasia and refractory anemia.   For right now, I think we go ahead and just treat him.  His blood counts are holding pretty stable.  I think we can probably actually go 8 weeks in between treatments now.  I think he is done quite well.  I feel bad that he had the compression fractures.  I know this is such a difficulty for him.  We will see him back in 8  weeks.   Volanda Napoleon, MD 6/3/20199:44 AM

## 2017-11-11 NOTE — Telephone Encounter (Signed)
cxled paientts appts the week of 7/29 per pt wife. She stated that he will come back 8/5 to get 8 full weeks off of treatment. Gave pt wife new appts for the week of 8/5 to 8/9

## 2017-11-11 NOTE — Patient Instructions (Signed)
Implanted Port Home Guide An implanted port is a type of central line that is placed under the skin. Central lines are used to provide IV access when treatment or nutrition needs to be given through a person's veins. Implanted ports are used for long-term IV access. An implanted port may be placed because:  You need IV medicine that would be irritating to the small veins in your hands or arms.  You need long-term IV medicines, such as antibiotics.  You need IV nutrition for a long period.  You need frequent blood draws for lab tests.  You need dialysis.  Implanted ports are usually placed in the chest area, but they can also be placed in the upper arm, the abdomen, or the leg. An implanted port has two main parts:  Reservoir. The reservoir is round and will appear as a small, raised area under your skin. The reservoir is the part where a needle is inserted to give medicines or draw blood.  Catheter. The catheter is a thin, flexible tube that extends from the reservoir. The catheter is placed into a large vein. Medicine that is inserted into the reservoir goes into the catheter and then into the vein.  How will I care for my incision site? Do not get the incision site wet. Bathe or shower as directed by your health care provider. How is my port accessed? Special steps must be taken to access the port:  Before the port is accessed, a numbing cream can be placed on the skin. This helps numb the skin over the port site.  Your health care provider uses a sterile technique to access the port. ? Your health care provider must put on a mask and sterile gloves. ? The skin over your port is cleaned carefully with an antiseptic and allowed to dry. ? The port is gently pinched between sterile gloves, and a needle is inserted into the port.  Only "non-coring" port needles should be used to access the port. Once the port is accessed, a blood return should be checked. This helps ensure that the port  is in the vein and is not clogged.  If your port needs to remain accessed for a constant infusion, a clear (transparent) bandage will be placed over the needle site. The bandage and needle will need to be changed every week, or as directed by your health care provider.  Keep the bandage covering the needle clean and dry. Do not get it wet. Follow your health care provider's instructions on how to take a shower or bath while the port is accessed.  If your port does not need to stay accessed, no bandage is needed over the port.  What is flushing? Flushing helps keep the port from getting clogged. Follow your health care provider's instructions on how and when to flush the port. Ports are usually flushed with saline solution or a medicine called heparin. The need for flushing will depend on how the port is used.  If the port is used for intermittent medicines or blood draws, the port will need to be flushed: ? After medicines have been given. ? After blood has been drawn. ? As part of routine maintenance.  If a constant infusion is running, the port may not need to be flushed.  How long will my port stay implanted? The port can stay in for as long as your health care provider thinks it is needed. When it is time for the port to come out, surgery will be   done to remove it. The procedure is similar to the one performed when the port was put in. When should I seek immediate medical care? When you have an implanted port, you should seek immediate medical care if:  You notice a bad smell coming from the incision site.  You have swelling, redness, or drainage at the incision site.  You have more swelling or pain at the port site or the surrounding area.  You have a fever that is not controlled with medicine.  This information is not intended to replace advice given to you by your health care provider. Make sure you discuss any questions you have with your health care provider. Document  Released: 05/28/2005 Document Revised: 11/03/2015 Document Reviewed: 02/02/2013 Elsevier Interactive Patient Education  2017 [employer]  

## 2017-11-12 ENCOUNTER — Inpatient Hospital Stay: Payer: Medicare Other

## 2017-11-12 VITALS — BP 152/68 | HR 91 | Temp 98.0°F | Resp 20

## 2017-11-12 DIAGNOSIS — D4621 Refractory anemia with excess of blasts 1: Secondary | ICD-10-CM | POA: Diagnosis not present

## 2017-11-12 DIAGNOSIS — D4620 Refractory anemia with excess of blasts, unspecified: Secondary | ICD-10-CM

## 2017-11-12 MED ORDER — SODIUM CHLORIDE 0.9 % IV SOLN
Freq: Once | INTRAVENOUS | Status: AC
  Administered 2017-11-12: 11:00:00 via INTRAVENOUS

## 2017-11-12 MED ORDER — PROCHLORPERAZINE MALEATE 10 MG PO TABS: 10.0000 mg | ORAL_TABLET | Freq: Once | ORAL | Status: DC

## 2017-11-12 MED ORDER — SODIUM CHLORIDE 0.9% FLUSH
10.0000 mL | INTRAVENOUS | Status: DC | PRN
  Administered 2017-11-12: 10 mL
  Filled 2017-11-12: qty 10

## 2017-11-12 MED ORDER — SODIUM CHLORIDE 0.9 % IV SOLN
20.0000 mg/m2 | Freq: Once | INTRAVENOUS | Status: AC
  Administered 2017-11-12: 45 mg via INTRAVENOUS
  Filled 2017-11-12: qty 9

## 2017-11-12 MED ORDER — HEPARIN SOD (PORK) LOCK FLUSH 100 UNIT/ML IV SOLN
500.0000 [IU] | Freq: Once | INTRAVENOUS | Status: AC | PRN
  Administered 2017-11-12: 500 [IU]
  Filled 2017-11-12: qty 5

## 2017-11-13 ENCOUNTER — Inpatient Hospital Stay: Payer: Medicare Other

## 2017-11-13 DIAGNOSIS — D4621 Refractory anemia with excess of blasts 1: Secondary | ICD-10-CM | POA: Diagnosis not present

## 2017-11-13 DIAGNOSIS — D4620 Refractory anemia with excess of blasts, unspecified: Secondary | ICD-10-CM

## 2017-11-13 MED ORDER — HEPARIN SOD (PORK) LOCK FLUSH 100 UNIT/ML IV SOLN
500.0000 [IU] | Freq: Once | INTRAVENOUS | Status: AC | PRN
  Administered 2017-11-13: 500 [IU]
  Filled 2017-11-13: qty 5

## 2017-11-13 MED ORDER — SODIUM CHLORIDE 0.9% FLUSH
10.0000 mL | INTRAVENOUS | Status: DC | PRN
  Administered 2017-11-13: 10 mL
  Filled 2017-11-13: qty 10

## 2017-11-13 MED ORDER — SODIUM CHLORIDE 0.9 % IV SOLN
Freq: Once | INTRAVENOUS | Status: AC
  Administered 2017-11-13: 11:00:00 via INTRAVENOUS

## 2017-11-13 MED ORDER — SODIUM CHLORIDE 0.9 % IV SOLN
20.0000 mg/m2 | Freq: Once | INTRAVENOUS | Status: AC
  Administered 2017-11-13: 45 mg via INTRAVENOUS
  Filled 2017-11-13: qty 9

## 2017-11-14 ENCOUNTER — Inpatient Hospital Stay: Payer: Medicare Other

## 2017-11-14 VITALS — BP 134/70 | HR 87 | Temp 97.9°F | Resp 18

## 2017-11-14 DIAGNOSIS — D4620 Refractory anemia with excess of blasts, unspecified: Secondary | ICD-10-CM

## 2017-11-14 DIAGNOSIS — D4621 Refractory anemia with excess of blasts 1: Secondary | ICD-10-CM | POA: Diagnosis not present

## 2017-11-14 MED ORDER — SODIUM CHLORIDE 0.9 % IV SOLN
20.0000 mg/m2 | Freq: Once | INTRAVENOUS | Status: AC
  Administered 2017-11-14: 45 mg via INTRAVENOUS
  Filled 2017-11-14: qty 9

## 2017-11-14 MED ORDER — SODIUM CHLORIDE 0.9% FLUSH
10.0000 mL | INTRAVENOUS | Status: DC | PRN
  Administered 2017-11-14: 10 mL
  Filled 2017-11-14: qty 10

## 2017-11-14 MED ORDER — HEPARIN SOD (PORK) LOCK FLUSH 100 UNIT/ML IV SOLN
500.0000 [IU] | Freq: Once | INTRAVENOUS | Status: AC | PRN
  Administered 2017-11-14: 500 [IU]
  Filled 2017-11-14: qty 5

## 2017-11-14 MED ORDER — SODIUM CHLORIDE 0.9% FLUSH
3.0000 mL | INTRAVENOUS | Status: DC | PRN
  Filled 2017-11-14: qty 10

## 2017-11-14 MED ORDER — SODIUM CHLORIDE 0.9 % IV SOLN
Freq: Once | INTRAVENOUS | Status: AC
  Administered 2017-11-14: 11:00:00 via INTRAVENOUS

## 2017-11-14 NOTE — Patient Instructions (Signed)
Decitabine injection for infusion What is this medicine? DECITABINE (dee SYE ta been) is a chemotherapy drug. This medicine reduces the growth of cancer cells. It is used to treat adults with myelodysplastic syndromes. This medicine may be used for other purposes; ask your health care provider or pharmacist if you have questions. COMMON BRAND NAME(S): Dacogen What should I tell my health care provider before I take this medicine? They need to know if you have any of these conditions: -infection (especially a virus infection such as chickenpox, cold sores, or herpes) -kidney disease -liver disease -an unusual or allergic reaction to decitabine, other medicines, foods, dyes, or preservatives -pregnant or trying to get pregnant -breast-feeding How should I use this medicine? This medicine is for infusion into a vein. It is administered in a hospital or clinic by a doctor or health care professional. Talk to your pediatrician regarding the use of this medicine in children. Special care may be needed. Overdosage: If you think you have taken too much of this medicine contact a poison control center or emergency room at once. NOTE: This medicine is only for you. Do not share this medicine with others. What if I miss a dose? It is important not to miss your dose. Call your doctor or health care professional if you are unable to keep an appointment. What may interact with this medicine? -vaccines Talk to your doctor or health care professional before taking any of these medicines: -aspirin -acetaminophen -ibuprofen -ketoprofen -naproxen This list may not describe all possible interactions. Give your health care provider a list of all the medicines, herbs, non-prescription drugs, or dietary supplements you use. Also tell them if you smoke, drink alcohol, or use illegal drugs. Some items may interact with your medicine. What should I watch for while using this medicine? Visit your doctor for  checks on your progress. This drug may make you feel generally unwell. This is not uncommon, as chemotherapy can affect healthy cells as well as cancer cells. Report any side effects. Continue your course of treatment even though you feel ill unless your doctor tells you to stop. In some cases, you may be given additional medicines to help with side effects. Follow all directions for their use. Call your doctor or health care professional for advice if you get a fever, chills or sore throat, or other symptoms of a cold or flu. Do not treat yourself. This drug decreases your body's ability to fight infections. Try to avoid being around people who are sick. This medicine may increase your risk to bruise or bleed. Call your doctor or health care professional if you notice any unusual bleeding. Do not become pregnant while taking this medicine or for at least 1 month after stopping it. Women should inform their doctor if they wish to become pregnant or think they might be pregnant. Men should not father a child while taking this medicine and for at least 2 months after stopping it. There is a potential for serious side effects to an unborn child. Talk to your health care professional or pharmacist for more information. Do not breast-feed an infant while taking this medicine. What side effects may I notice from receiving this medicine? Side effects that you should report to your doctor or health care professional as soon as possible: -low blood counts - this medicine may decrease the number of white blood cells, red blood cells and platelets. You may be at increased risk for infections and bleeding. -signs of infection - fever or   chills, cough, sore throat, pain or difficulty passing urine -signs of decreased platelets or bleeding - bruising, pinpoint red spots on the skin, black, tarry stools, blood in the urine -signs of decreased red blood cells - unusual weakness or tiredness, fainting spells,  lightheadedness -increased blood sugar Side effects that usually do not require medical attention (report to your doctor or health care professional if they continue or are bothersome): -constipation -diarrhea -headache -loss of appetite -nausea, vomiting -skin rash, itching -stomach pain -water retention -weak or tired This list may not describe all possible side effects. Call your doctor for medical advice about side effects. You may report side effects to FDA at 1-800-FDA-1088. Where should I keep my medicine? This drug is given in a hospital or clinic and will not be stored at home. NOTE: This sheet is a summary. It may not cover all possible information. If you have questions about this medicine, talk to your doctor, pharmacist, or health care provider.  2018 Elsevier/Gold Standard (2015-06-30 15:52:57)  

## 2017-11-15 ENCOUNTER — Inpatient Hospital Stay: Payer: Medicare Other

## 2017-11-15 DIAGNOSIS — D4620 Refractory anemia with excess of blasts, unspecified: Secondary | ICD-10-CM

## 2017-11-15 DIAGNOSIS — D4621 Refractory anemia with excess of blasts 1: Secondary | ICD-10-CM | POA: Diagnosis not present

## 2017-11-15 MED ORDER — SODIUM CHLORIDE 0.9% FLUSH
10.0000 mL | INTRAVENOUS | Status: DC | PRN
Start: 2017-11-15 — End: 2017-11-15
  Administered 2017-11-15: 10 mL
  Filled 2017-11-15: qty 10

## 2017-11-15 MED ORDER — SODIUM CHLORIDE 0.9 % IV SOLN
Freq: Once | INTRAVENOUS | Status: AC
  Administered 2017-11-15: 11:00:00 via INTRAVENOUS

## 2017-11-15 MED ORDER — DECITABINE CHEMO INJECTION 50 MG
20.0000 mg/m2 | Freq: Once | INTRAVENOUS | Status: AC
  Administered 2017-11-15: 45 mg via INTRAVENOUS
  Filled 2017-11-15: qty 9

## 2017-11-15 MED ORDER — HEPARIN SOD (PORK) LOCK FLUSH 100 UNIT/ML IV SOLN
500.0000 [IU] | Freq: Once | INTRAVENOUS | Status: AC | PRN
  Administered 2017-11-15: 500 [IU]
  Filled 2017-11-15: qty 5

## 2017-11-15 NOTE — Patient Instructions (Signed)
Decitabine injection for infusion What is this medicine? DECITABINE (dee SYE ta been) is a chemotherapy drug. This medicine reduces the growth of cancer cells. It is used to treat adults with myelodysplastic syndromes. This medicine may be used for other purposes; ask your health care provider or pharmacist if you have questions. COMMON BRAND NAME(S): Dacogen What should I tell my health care provider before I take this medicine? They need to know if you have any of these conditions: -infection (especially a virus infection such as chickenpox, cold sores, or herpes) -kidney disease -liver disease -an unusual or allergic reaction to decitabine, other medicines, foods, dyes, or preservatives -pregnant or trying to get pregnant -breast-feeding How should I use this medicine? This medicine is for infusion into a vein. It is administered in a hospital or clinic by a doctor or health care professional. Talk to your pediatrician regarding the use of this medicine in children. Special care may be needed. Overdosage: If you think you have taken too much of this medicine contact a poison control center or emergency room at once. NOTE: This medicine is only for you. Do not share this medicine with others. What if I miss a dose? It is important not to miss your dose. Call your doctor or health care professional if you are unable to keep an appointment. What may interact with this medicine? -vaccines Talk to your doctor or health care professional before taking any of these medicines: -aspirin -acetaminophen -ibuprofen -ketoprofen -naproxen This list may not describe all possible interactions. Give your health care provider a list of all the medicines, herbs, non-prescription drugs, or dietary supplements you use. Also tell them if you smoke, drink alcohol, or use illegal drugs. Some items may interact with your medicine. What should I watch for while using this medicine? Visit your doctor for  checks on your progress. This drug may make you feel generally unwell. This is not uncommon, as chemotherapy can affect healthy cells as well as cancer cells. Report any side effects. Continue your course of treatment even though you feel ill unless your doctor tells you to stop. In some cases, you may be given additional medicines to help with side effects. Follow all directions for their use. Call your doctor or health care professional for advice if you get a fever, chills or sore throat, or other symptoms of a cold or flu. Do not treat yourself. This drug decreases your body's ability to fight infections. Try to avoid being around people who are sick. This medicine may increase your risk to bruise or bleed. Call your doctor or health care professional if you notice any unusual bleeding. Do not become pregnant while taking this medicine or for at least 1 month after stopping it. Women should inform their doctor if they wish to become pregnant or think they might be pregnant. Men should not father a child while taking this medicine and for at least 2 months after stopping it. There is a potential for serious side effects to an unborn child. Talk to your health care professional or pharmacist for more information. Do not breast-feed an infant while taking this medicine. What side effects may I notice from receiving this medicine? Side effects that you should report to your doctor or health care professional as soon as possible: -low blood counts - this medicine may decrease the number of white blood cells, red blood cells and platelets. You may be at increased risk for infections and bleeding. -signs of infection - fever or   chills, cough, sore throat, pain or difficulty passing urine -signs of decreased platelets or bleeding - bruising, pinpoint red spots on the skin, black, tarry stools, blood in the urine -signs of decreased red blood cells - unusual weakness or tiredness, fainting spells,  lightheadedness -increased blood sugar Side effects that usually do not require medical attention (report to your doctor or health care professional if they continue or are bothersome): -constipation -diarrhea -headache -loss of appetite -nausea, vomiting -skin rash, itching -stomach pain -water retention -weak or tired This list may not describe all possible side effects. Call your doctor for medical advice about side effects. You may report side effects to FDA at 1-800-FDA-1088. Where should I keep my medicine? This drug is given in a hospital or clinic and will not be stored at home. NOTE: This sheet is a summary. It may not cover all possible information. If you have questions about this medicine, talk to your doctor, pharmacist, or health care provider.  2018 Elsevier/Gold Standard (2015-06-30 15:52:57)  

## 2017-12-04 ENCOUNTER — Ambulatory Visit: Payer: Medicare Other | Admitting: Interventional Cardiology

## 2018-01-06 ENCOUNTER — Ambulatory Visit: Payer: Medicare Other | Admitting: Hematology & Oncology

## 2018-01-06 ENCOUNTER — Other Ambulatory Visit: Payer: Medicare Other

## 2018-01-06 ENCOUNTER — Ambulatory Visit: Payer: Medicare Other

## 2018-01-07 ENCOUNTER — Ambulatory Visit: Payer: Medicare Other

## 2018-01-08 ENCOUNTER — Ambulatory Visit: Payer: Medicare Other

## 2018-01-09 ENCOUNTER — Ambulatory Visit: Payer: Medicare Other

## 2018-01-10 ENCOUNTER — Ambulatory Visit: Payer: Medicare Other

## 2018-01-13 ENCOUNTER — Inpatient Hospital Stay: Payer: Medicare Other | Attending: Family | Admitting: Hematology & Oncology

## 2018-01-13 ENCOUNTER — Encounter: Payer: Self-pay | Admitting: Hematology & Oncology

## 2018-01-13 ENCOUNTER — Other Ambulatory Visit: Payer: Self-pay | Admitting: Family

## 2018-01-13 ENCOUNTER — Telehealth: Payer: Self-pay | Admitting: Hematology & Oncology

## 2018-01-13 ENCOUNTER — Other Ambulatory Visit: Payer: Self-pay

## 2018-01-13 ENCOUNTER — Inpatient Hospital Stay: Payer: Medicare Other

## 2018-01-13 VITALS — BP 121/66 | HR 90 | Temp 98.1°F | Resp 20 | Wt 205.0 lb

## 2018-01-13 DIAGNOSIS — Z5111 Encounter for antineoplastic chemotherapy: Secondary | ICD-10-CM | POA: Insufficient documentation

## 2018-01-13 DIAGNOSIS — D46Z Other myelodysplastic syndromes: Secondary | ICD-10-CM

## 2018-01-13 DIAGNOSIS — D4621 Refractory anemia with excess of blasts 1: Secondary | ICD-10-CM

## 2018-01-13 DIAGNOSIS — D4620 Refractory anemia with excess of blasts, unspecified: Secondary | ICD-10-CM

## 2018-01-13 DIAGNOSIS — F419 Anxiety disorder, unspecified: Secondary | ICD-10-CM

## 2018-01-13 DIAGNOSIS — D500 Iron deficiency anemia secondary to blood loss (chronic): Secondary | ICD-10-CM

## 2018-01-13 DIAGNOSIS — Z9221 Personal history of antineoplastic chemotherapy: Secondary | ICD-10-CM | POA: Diagnosis not present

## 2018-01-13 DIAGNOSIS — S32010G Wedge compression fracture of first lumbar vertebra, subsequent encounter for fracture with delayed healing: Secondary | ICD-10-CM

## 2018-01-13 DIAGNOSIS — R110 Nausea: Secondary | ICD-10-CM

## 2018-01-13 LAB — CMP (CANCER CENTER ONLY)
ALBUMIN: 3.4 g/dL — AB (ref 3.5–5.0)
ALT: 24 U/L (ref 10–47)
AST: 22 U/L (ref 11–38)
Alkaline Phosphatase: 97 U/L — ABNORMAL HIGH (ref 26–84)
Anion gap: 11 (ref 5–15)
BILIRUBIN TOTAL: 0.6 mg/dL (ref 0.2–1.6)
BUN: 29 mg/dL — ABNORMAL HIGH (ref 7–22)
CHLORIDE: 103 mmol/L (ref 98–108)
CO2: 29 mmol/L (ref 18–33)
CREATININE: 1.5 mg/dL — AB (ref 0.60–1.20)
Calcium: 9.4 mg/dL (ref 8.0–10.3)
Glucose, Bld: 118 mg/dL (ref 73–118)
POTASSIUM: 4.4 mmol/L (ref 3.3–4.7)
Sodium: 143 mmol/L (ref 128–145)
Total Protein: 6.5 g/dL (ref 6.4–8.1)

## 2018-01-13 LAB — IRON AND TIBC
IRON: 96 ug/dL (ref 42–163)
Saturation Ratios: 36 % — ABNORMAL LOW (ref 42–163)
TIBC: 266 ug/dL (ref 202–409)
UIBC: 170 ug/dL

## 2018-01-13 LAB — RETICULOCYTES
RBC.: 3.11 MIL/uL — ABNORMAL LOW (ref 4.20–5.82)
RETIC COUNT ABSOLUTE: 43.5 10*3/uL (ref 34.8–93.9)
RETIC CT PCT: 1.4 % (ref 0.8–1.8)

## 2018-01-13 LAB — CBC WITH DIFFERENTIAL (CANCER CENTER ONLY)
BASOS ABS: 0 10*3/uL (ref 0.0–0.1)
BASOS PCT: 0 %
Eosinophils Absolute: 0 10*3/uL (ref 0.0–0.5)
Eosinophils Relative: 1 %
HEMATOCRIT: 32.7 % — AB (ref 38.7–49.9)
Hemoglobin: 10.5 g/dL — ABNORMAL LOW (ref 13.0–17.1)
Lymphocytes Relative: 19 %
Lymphs Abs: 0.9 10*3/uL (ref 0.9–3.3)
MCH: 34.5 pg — AB (ref 28.0–33.4)
MCHC: 32.1 g/dL (ref 32.0–35.9)
MCV: 107.6 fL — ABNORMAL HIGH (ref 82.0–98.0)
MONO ABS: 0.5 10*3/uL (ref 0.1–0.9)
Monocytes Relative: 11 %
NEUTROS ABS: 3.2 10*3/uL (ref 1.5–6.5)
Neutrophils Relative %: 69 %
Platelet Count: 155 10*3/uL (ref 145–400)
RBC: 3.04 MIL/uL — AB (ref 4.20–5.70)
RDW: 16.9 % — AB (ref 11.1–15.7)
WBC Count: 4.6 10*3/uL (ref 4.0–10.0)

## 2018-01-13 LAB — FERRITIN: FERRITIN: 1925 ng/mL — AB (ref 24–336)

## 2018-01-13 LAB — LACTATE DEHYDROGENASE: LDH: 225 U/L — AB (ref 98–192)

## 2018-01-13 MED ORDER — HEPARIN SOD (PORK) LOCK FLUSH 100 UNIT/ML IV SOLN
500.0000 [IU] | Freq: Once | INTRAVENOUS | Status: AC | PRN
  Administered 2018-01-13: 500 [IU]
  Filled 2018-01-13: qty 5

## 2018-01-13 MED ORDER — SODIUM CHLORIDE 0.9 % IV SOLN
Freq: Once | INTRAVENOUS | Status: AC
  Administered 2018-01-13: 11:00:00 via INTRAVENOUS
  Filled 2018-01-13: qty 250

## 2018-01-13 MED ORDER — PROCHLORPERAZINE MALEATE 10 MG PO TABS: 10.0000 mg | ORAL_TABLET | Freq: Once | ORAL | Status: DC

## 2018-01-13 MED ORDER — LORAZEPAM 0.5 MG PO TABS: 0.5000 mg | ORAL_TABLET | Freq: Four times a day (QID) | ORAL | 0 refills | Status: DC | PRN

## 2018-01-13 MED ORDER — SODIUM CHLORIDE 0.9 % IV SOLN
20.0000 mg/m2 | Freq: Once | INTRAVENOUS | Status: AC
  Administered 2018-01-13: 45 mg via INTRAVENOUS
  Filled 2018-01-13: qty 9

## 2018-01-13 MED ORDER — SODIUM CHLORIDE 0.9% FLUSH
10.0000 mL | INTRAVENOUS | Status: DC | PRN
  Administered 2018-01-13: 10 mL
  Filled 2018-01-13: qty 10

## 2018-01-13 MED FILL — LORazepam 0.5 MG TABS: 0.5 | 23 days supply | Qty: 90 | Fill #0

## 2018-01-13 NOTE — Patient Instructions (Signed)
Decitabine injection for infusion What is this medicine? DECITABINE (dee SYE ta been) is a chemotherapy drug. This medicine reduces the growth of cancer cells. It is used to treat adults with myelodysplastic syndromes. This medicine may be used for other purposes; ask your health care provider or pharmacist if you have questions. COMMON BRAND NAME(S): Dacogen What should I tell my health care provider before I take this medicine? They need to know if you have any of these conditions: -infection (especially a virus infection such as chickenpox, cold sores, or herpes) -kidney disease -liver disease -an unusual or allergic reaction to decitabine, other medicines, foods, dyes, or preservatives -pregnant or trying to get pregnant -breast-feeding How should I use this medicine? This medicine is for infusion into a vein. It is administered in a hospital or clinic by a doctor or health care professional. Talk to your pediatrician regarding the use of this medicine in children. Special care may be needed. Overdosage: If you think you have taken too much of this medicine contact a poison control center or emergency room at once. NOTE: This medicine is only for you. Do not share this medicine with others. What if I miss a dose? It is important not to miss your dose. Call your doctor or health care professional if you are unable to keep an appointment. What may interact with this medicine? -vaccines Talk to your doctor or health care professional before taking any of these medicines: -aspirin -acetaminophen -ibuprofen -ketoprofen -naproxen This list may not describe all possible interactions. Give your health care provider a list of all the medicines, herbs, non-prescription drugs, or dietary supplements you use. Also tell them if you smoke, drink alcohol, or use illegal drugs. Some items may interact with your medicine. What should I watch for while using this medicine? Visit your doctor for  checks on your progress. This drug may make you feel generally unwell. This is not uncommon, as chemotherapy can affect healthy cells as well as cancer cells. Report any side effects. Continue your course of treatment even though you feel ill unless your doctor tells you to stop. In some cases, you may be given additional medicines to help with side effects. Follow all directions for their use. Call your doctor or health care professional for advice if you get a fever, chills or sore throat, or other symptoms of a cold or flu. Do not treat yourself. This drug decreases your body's ability to fight infections. Try to avoid being around people who are sick. This medicine may increase your risk to bruise or bleed. Call your doctor or health care professional if you notice any unusual bleeding. Do not become pregnant while taking this medicine or for at least 1 month after stopping it. Women should inform their doctor if they wish to become pregnant or think they might be pregnant. Men should not father a child while taking this medicine and for at least 2 months after stopping it. There is a potential for serious side effects to an unborn child. Talk to your health care professional or pharmacist for more information. Do not breast-feed an infant while taking this medicine. What side effects may I notice from receiving this medicine? Side effects that you should report to your doctor or health care professional as soon as possible: -low blood counts - this medicine may decrease the number of white blood cells, red blood cells and platelets. You may be at increased risk for infections and bleeding. -signs of infection - fever or   chills, cough, sore throat, pain or difficulty passing urine -signs of decreased platelets or bleeding - bruising, pinpoint red spots on the skin, black, tarry stools, blood in the urine -signs of decreased red blood cells - unusual weakness or tiredness, fainting spells,  lightheadedness -increased blood sugar Side effects that usually do not require medical attention (report to your doctor or health care professional if they continue or are bothersome): -constipation -diarrhea -headache -loss of appetite -nausea, vomiting -skin rash, itching -stomach pain -water retention -weak or tired This list may not describe all possible side effects. Call your doctor for medical advice about side effects. You may report side effects to FDA at 1-800-FDA-1088. Where should I keep my medicine? This drug is given in a hospital or clinic and will not be stored at home. NOTE: This sheet is a summary. It may not cover all possible information. If you have questions about this medicine, talk to your doctor, pharmacist, or health care provider.  2018 Elsevier/Gold Standard (2015-06-30 15:52:57)  

## 2018-01-13 NOTE — Progress Notes (Signed)
Hematology and Oncology Follow Up Visit  ELVEN LABOY [id number] [birth year] 77 y.o. 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 Justen is here today with his wife for follow-up and treatment.   He is still having problems with his lower back.  He sustained compression fractures in his back when his lawnmower went into a ditch.  He has seen orthopedics.  He is seeing a pain specialist.  He has had some injections.  These really have not helped all that much.  Hopefully, something else can be done for him so that his quality of life will improve.  Otherwise, he has been doing okay with the decitabine.  However, I have noticed that his hemoglobin has been dropped Marlou Sa slowly down.  I am not sure why.  However I think that we are going to have to consider a bone marrow test on him if he continues to show decrease in his hemoglobin.  He is had no fever.  His appetite is okay.  Is had no nausea or vomiting.  There is no issue with bowels or bladder.  He gets around with a rolling walker.   In June when we last saw him, his ferritin was 1620 with iron saturation of 42%.  Overall, his performance status is ECOG 2.   Medications:  Allergies as of 01/13/2018   No Known Allergies     Medication List        Accurate as of 01/13/18 10:34 AM. Always use your most recent med list.          bimatoprost 0.01 % Soln Commonly known as:  LUMIGAN Place 1 drop into both eyes at bedtime.   budesonide-formoterol 160-4.5 MCG/ACT inhaler Commonly known as:  SYMBICORT Inhale 2 puffs into the lungs 2 (two) times daily.   buPROPion 150 MG 24 hr tablet Commonly known as:  WELLBUTRIN XL Take  150 mg by mouth daily.   CITRACAL PO Take 2,000 Units by mouth 2 (two) times daily.   clopidogrel 75 MG tablet Commonly known as:  PLAVIX Take 75 mg by mouth daily.   co-enzyme Q-10 30 MG capsule Take 100 mg by mouth daily.   Cyanocobalamin 2500 MCG Tabs Take 5,000 mcg by mouth daily.   fish oil-omega-3 fatty acids 1000 MG capsule Take 1 g by mouth daily.   glucosamine-chondroitin 500-400 MG tablet Take 1 tablet by mouth daily.   HYDROcodone-acetaminophen 7.5-325 MG tablet Commonly known as:  NORCO TAKE 1 TABLET BY MOUTH AS NEEDED EVERY 6 HOURS FOR 7 DAYS   LORazepam 0.5 MG tablet Commonly known as:  ATIVAN Take 1 tablet (0.5 mg total) by mouth every 6 (six) hours as needed (Nausea or vomiting).   magnesium oxide 400 MG tablet Commonly known as:  MAG-OX Take 400 mg by mouth daily.   Menthol (Topical Analgesic) 2.5 % Gel Apply topically.   NEURONTIN 300 MG capsule Generic drug:  gabapentin Take 300 mg by mouth daily.   orphenadrine 100 MG tablet Commonly known as:  NORFLEX Take 1 tablet (100 mg total) by mouth 2 (two) times daily as needed for muscle spasms.   pantoprazole 40 MG tablet Commonly  known as:  PROTONIX Take 40 mg by mouth daily.   predniSONE 20 MG tablet Commonly known as:  DELTASONE Take 1 tablet (20 mg total) by mouth daily with breakfast.   PROAIR RESPICLICK 973 (90 Base) MCG/ACT Aepb Generic drug:  Albuterol Sulfate Inhale 2 puffs into the lungs every 6 (six) hours as needed.   prochlorperazine 10 MG tablet Commonly known as:  COMPAZINE Take 1 tablet (10 mg total) by mouth every 6 (six) hours as needed (Nausea or vomiting).   ranitidine 300 MG tablet Commonly known as:  ZANTAC Take 300 mg by mouth at bedtime.   sertraline 100 MG tablet Commonly known as:  ZOLOFT Take 100 mg by mouth daily.   SIMBRINZA 1-0.2 % Susp Generic drug:  Brinzolamide-Brimonidine   simvastatin 40 MG tablet Commonly known as:  ZOCOR Take 40 mg by mouth  every other day.   tamsulosin 0.4 MG Caps capsule Commonly known as:  FLOMAX   tiotropium 18 MCG inhalation capsule Commonly known as:  SPIRIVA Place 18 mcg into inhaler and inhale daily.   triamcinolone cream 0.1 % Commonly known as:  KENALOG APPLY TO RASH/ITCH ON ARMS TWICE DAILY AS NEEDED   Vitamin D3 5000 units Tabs Take by mouth daily.       Allergies: No Known Allergies  Past Medical History, Surgical history, Social history, and Family History were reviewed and updated.  Review of Systems: Review of Systems  Constitutional: Negative.   HENT: Negative.   Eyes: Negative.   Respiratory: Negative.   Cardiovascular: Negative.   Gastrointestinal: Negative.   Genitourinary: Negative.   Musculoskeletal: Negative.   Skin: Negative.   Neurological: Negative.   Endo/Heme/Allergies: Negative.   Psychiatric/Behavioral: Negative.       Physical Exam:  weight is 205 lb (93 kg). His oral temperature is 98.1 F (36.7 C). His blood pressure is 121/66 and his pulse is 90. His respiration is 20 and oxygen saturation is 99%.   Wt Readings from Last 3 Encounters:  01/13/18 205 lb (93 kg)  11/11/17 206 lb (93.4 kg)  08/12/17 212 lb 1.6 oz (96.2 kg)    Physical Exam  Constitutional: He is oriented to person, place, and time.  HENT:  Head: Normocephalic and atraumatic.  Mouth/Throat: Oropharynx is clear and moist.  Eyes: Pupils are equal, round, and reactive to light. EOM are normal.  Neck: Normal range of motion.  Cardiovascular: Normal rate, regular rhythm and normal heart sounds.  Pulmonary/Chest: Effort normal and breath sounds normal.  Abdominal: Soft. Bowel sounds are normal.  Musculoskeletal: Normal range of motion. He exhibits no edema, tenderness or deformity.  Lymphadenopathy:    He has no cervical adenopathy.  Neurological: He is alert and oriented to person, place, and time.  Skin: Skin is warm and dry. No rash noted. No erythema.  Psychiatric: He has a  normal mood and affect. His behavior is normal. Judgment and thought content normal.  Vitals reviewed.   Lab Results  Component Value Date   WBC 4.6 01/13/2018   HGB 10.5 (L) 01/13/2018   HCT 32.7 (L) 01/13/2018   MCV 107.6 (H) 01/13/2018   PLT 155 01/13/2018   Lab Results  Component Value Date   FERRITIN 1,619 (H) 11/11/2017   IRON 115 11/11/2017   TIBC 271 11/11/2017   UIBC 156 11/11/2017   IRONPCTSAT 42 11/11/2017   Lab Results  Component Value Date   RETICCTPCT 1.9 (H) 11/11/2017   RBC 3.04 (L) 01/13/2018   RETICCTABS 39.4 05/30/2015  Lab Results  Component Value Date   KAPLAMBRATIO 1.11 12/17/2016   Lab Results  Component Value Date   IGGSERUM 735 12/17/2016   IGMSERUM 79 12/17/2016   Lab Results  Component Value Date   TOTALPROTELP 7.3 01/01/2014   ALBUMINELP 55.1 (L) 01/01/2014   A1GS 6.9 (H) 01/01/2014   A2GS 8.9 01/01/2014   BETS 7.9 (H) 01/01/2014   BETA2SER 6.7 (H) 01/01/2014   GAMS 14.5 01/01/2014   MSPIKE Not Observed 12/17/2016   SPEI * 01/01/2014     Chemistry      Component Value Date/Time   NA 143 01/13/2018 0920   NA 141 05/06/2017 0901   NA 140 04/16/2016 0930   K 4.4 01/13/2018 0920   K 3.8 05/06/2017 0901   K 3.9 04/16/2016 0930   CL 103 01/13/2018 0920   CL 105 05/06/2017 0901   CO2 29 01/13/2018 0920   CO2 28 05/06/2017 0901   CO2 24 04/16/2016 0930   BUN 29 (H) 01/13/2018 0920   BUN 27 (H) 05/06/2017 0901   BUN 20.5 04/16/2016 0930   CREATININE 1.50 (H) 01/13/2018 0920   CREATININE 1.4 (H) 05/06/2017 0901   CREATININE 1.2 04/16/2016 0930      Component Value Date/Time   CALCIUM 9.4 01/13/2018 0920   CALCIUM 8.8 05/06/2017 0901   CALCIUM 8.9 04/16/2016 0930   ALKPHOS 97 (H) 01/13/2018 0920   ALKPHOS 46 05/06/2017 0901   ALKPHOS 54 04/16/2016 0930   AST 22 01/13/2018 0920   AST 16 04/16/2016 0930   ALT 24 01/13/2018 0920   ALT 25 05/06/2017 0901   ALT 21 04/16/2016 0930   BILITOT 0.6 01/13/2018 0920   BILITOT 0.27  04/16/2016 0930      Impression and Plan: Mr. Bob is a very pleasant 77 yo caucasian gentleman with myelodysplasia and refractory anemia.   We will go ahead with his 19th cycle of decitabine.  I would like to see him back in 1 month just for lab work.  I want to make sure that his hemoglobin does not continue to drop.  If it does, then we will have to do another bone marrow test on him to assess his marrow status.     Volanda Napoleon, MD 8/5/201910:34 AM

## 2018-01-13 NOTE — Patient Instructions (Signed)
Implanted Port Home Guide An implanted port is a type of central line that is placed under the skin. Central lines are used to provide IV access when treatment or nutrition needs to be given through a person's veins. Implanted ports are used for long-term IV access. An implanted port may be placed because:  You need IV medicine that would be irritating to the small veins in your hands or arms.  You need long-term IV medicines, such as antibiotics.  You need IV nutrition for a long period.  You need frequent blood draws for lab tests.  You need dialysis.  Implanted ports are usually placed in the chest area, but they can also be placed in the upper arm, the abdomen, or the leg. An implanted port has two main parts:  Reservoir. The reservoir is round and will appear as a small, raised area under your skin. The reservoir is the part where a needle is inserted to give medicines or draw blood.  Catheter. The catheter is a thin, flexible tube that extends from the reservoir. The catheter is placed into a large vein. Medicine that is inserted into the reservoir goes into the catheter and then into the vein.  How will I care for my incision site? Do not get the incision site wet. Bathe or shower as directed by your health care provider. How is my port accessed? Special steps must be taken to access the port:  Before the port is accessed, a numbing cream can be placed on the skin. This helps numb the skin over the port site.  Your health care provider uses a sterile technique to access the port. ? Your health care provider must put on a mask and sterile gloves. ? The skin over your port is cleaned carefully with an antiseptic and allowed to dry. ? The port is gently pinched between sterile gloves, and a needle is inserted into the port.  Only "non-coring" port needles should be used to access the port. Once the port is accessed, a blood return should be checked. This helps ensure that the port  is in the vein and is not clogged.  If your port needs to remain accessed for a constant infusion, a clear (transparent) bandage will be placed over the needle site. The bandage and needle will need to be changed every week, or as directed by your health care provider.  Keep the bandage covering the needle clean and dry. Do not get it wet. Follow your health care provider's instructions on how to take a shower or bath while the port is accessed.  If your port does not need to stay accessed, no bandage is needed over the port.  What is flushing? Flushing helps keep the port from getting clogged. Follow your health care provider's instructions on how and when to flush the port. Ports are usually flushed with saline solution or a medicine called heparin. The need for flushing will depend on how the port is used.  If the port is used for intermittent medicines or blood draws, the port will need to be flushed: ? After medicines have been given. ? After blood has been drawn. ? As part of routine maintenance.  If a constant infusion is running, the port may not need to be flushed.  How long will my port stay implanted? The port can stay in for as long as your health care provider thinks it is needed. When it is time for the port to come out, surgery will be   done to remove it. The procedure is similar to the one performed when the port was put in. When should I seek immediate medical care? When you have an implanted port, you should seek immediate medical care if:  You notice a bad smell coming from the incision site.  You have swelling, redness, or drainage at the incision site.  You have more swelling or pain at the port site or the surrounding area.  You have a fever that is not controlled with medicine.  This information is not intended to replace advice given to you by your health care provider. Make sure you discuss any questions you have with your health care provider. Document  Released: 05/28/2005 Document Revised: 11/03/2015 Document Reviewed: 02/02/2013 Elsevier Interactive Patient Education  2017 [employer]  

## 2018-01-13 NOTE — Telephone Encounter (Signed)
Appointments scheduled patient to pick up copy of schedule at next appointment per 8/5 los

## 2018-01-14 ENCOUNTER — Inpatient Hospital Stay: Payer: Medicare Other

## 2018-01-14 VITALS — BP 149/68 | HR 96 | Temp 97.9°F | Resp 17

## 2018-01-14 DIAGNOSIS — Z5111 Encounter for antineoplastic chemotherapy: Secondary | ICD-10-CM | POA: Diagnosis not present

## 2018-01-14 DIAGNOSIS — D4620 Refractory anemia with excess of blasts, unspecified: Secondary | ICD-10-CM

## 2018-01-14 LAB — VITAMIN D 25 HYDROXY (VIT D DEFICIENCY, FRACTURES): Vit D, 25-Hydroxy: 45 ng/mL (ref 30.0–100.0)

## 2018-01-14 MED ORDER — SODIUM CHLORIDE 0.9% FLUSH
10.0000 mL | INTRAVENOUS | Status: DC | PRN
  Administered 2018-01-14: 10 mL
  Filled 2018-01-14: qty 10

## 2018-01-14 MED ORDER — HEPARIN SOD (PORK) LOCK FLUSH 100 UNIT/ML IV SOLN
500.0000 [IU] | Freq: Once | INTRAVENOUS | Status: AC | PRN
  Administered 2018-01-14: 500 [IU]
  Filled 2018-01-14: qty 5

## 2018-01-14 MED ORDER — SODIUM CHLORIDE 0.9 % IV SOLN
20.0000 mg/m2 | Freq: Once | INTRAVENOUS | Status: AC
  Administered 2018-01-14: 45 mg via INTRAVENOUS
  Filled 2018-01-14: qty 9

## 2018-01-14 MED ORDER — SODIUM CHLORIDE 0.9 % IV SOLN
Freq: Once | INTRAVENOUS | Status: AC
  Administered 2018-01-14: 10:00:00 via INTRAVENOUS
  Filled 2018-01-14: qty 250

## 2018-01-14 NOTE — Patient Instructions (Signed)
Dacarbazine, DTIC injection What is this medicine? DACARBAZINE (da KAR ba zeen) is a chemotherapy drug. This medicine is used to treat skin cancer. It is also used with other medicines to treat Hodgkin's disease. This medicine may be used for other purposes; ask your health care provider or pharmacist if you have questions. COMMON BRAND NAME(S): DTIC-Dome What should I tell my health care provider before I take this medicine? They need to know if you have any of these conditions: -infection (especially virus infection such as chickenpox, cold sores, or herpes) -kidney disease -liver disease -low blood counts like low platelets, red blood cells, white blood cells -recent radiation therapy -an unusual or allergic reaction to dacarbazine, other chemotherapy agents, other medicines, foods, dyes, or preservatives -pregnant or trying to get pregnant -breast-feeding How should I use this medicine? This drug is given as an injection or infusion into a vein. It is administered in a hospital or clinic by a specially trained health care professional. Talk to your pediatrician regarding the use of this medicine in children. While this drug may be prescribed for selected conditions, precautions do apply. Overdosage: If you think you have taken too much of this medicine contact a poison control center or emergency room at once. NOTE: This medicine is only for you. Do not share this medicine with others. What if I miss a dose? It is important not to miss your dose. Call your doctor or health care professional if you are unable to keep an appointment. What may interact with this medicine? -medicines to increase blood counts like filgrastim, pegfilgrastim, sargramostim -vaccines This list may not describe all possible interactions. Give your health care provider a list of all the medicines, herbs, non-prescription drugs, or dietary supplements you use. Also tell them if you smoke, drink alcohol, or use  illegal drugs. Some items may interact with your medicine. What should I watch for while using this medicine? Your condition will be monitored carefully while you are receiving this medicine. You will need important blood work done while you are taking this medicine. This drug may make you feel generally unwell. This is not uncommon, as chemotherapy can affect healthy cells as well as cancer cells. Report any side effects. Continue your course of treatment even though you feel ill unless your doctor tells you to stop. Call your doctor or health care professional for advice if you get a fever, chills or sore throat, or other symptoms of a cold or flu. Do not treat yourself. This drug decreases your body's ability to fight infections. Try to avoid being around people who are sick. This medicine may increase your risk to bruise or bleed. Call your doctor or health care professional if you notice any unusual bleeding. Talk to your doctor about your risk of cancer. You may be more at risk for certain types of cancers if you take this medicine. Do not become pregnant while taking this medicine. Women should inform their doctor if they wish to become pregnant or think they might be pregnant. There is a potential for serious side effects to an unborn child. Talk to your health care professional or pharmacist for more information. Do not breast-feed an infant while taking this medicine. What side effects may I notice from receiving this medicine? Side effects that you should report to your doctor or health care professional as soon as possible: -allergic reactions like skin rash, itching or hives, swelling of the face, lips, or tongue -low blood counts - this medicine may  decrease the number of white blood cells, red blood cells and platelets. You may be at increased risk for infections and bleeding. -signs of infection - fever or chills, cough, sore throat, pain or difficulty passing urine -signs of decreased  platelets or bleeding - bruising, pinpoint red spots on the skin, black, tarry stools, blood in the urine -signs of decreased red blood cells - unusually weak or tired, fainting spells, lightheadedness -breathing problems -muscle pains -pain at site where injected -trouble passing urine or change in the amount of urine -vomiting -yellowing of the eyes or skin Side effects that usually do not require medical attention (report to your doctor or health care professional if they continue or are bothersome): -diarrhea -hair loss -loss of appetite -nausea -skin more sensitive to sun or ultraviolet light -stomach upset This list may not describe all possible side effects. Call your doctor for medical advice about side effects. You may report side effects to FDA at 1-800-FDA-1088. Where should I keep my medicine? This drug is given in a hospital or clinic and will not be stored at home. NOTE: This sheet is a summary. It may not cover all possible information. If you have questions about this medicine, talk to your doctor, pharmacist, or health care provider.  2018 Elsevier/Gold Standard (2015-07-29 15:17:39)

## 2018-01-15 ENCOUNTER — Inpatient Hospital Stay: Payer: Medicare Other

## 2018-01-15 VITALS — BP 111/59 | HR 65 | Temp 98.1°F | Resp 17

## 2018-01-15 DIAGNOSIS — D4620 Refractory anemia with excess of blasts, unspecified: Secondary | ICD-10-CM

## 2018-01-15 DIAGNOSIS — Z5111 Encounter for antineoplastic chemotherapy: Secondary | ICD-10-CM | POA: Diagnosis not present

## 2018-01-15 MED ORDER — SODIUM CHLORIDE 0.9 % IV SOLN
Freq: Once | INTRAVENOUS | Status: AC
  Administered 2018-01-15: 10:00:00 via INTRAVENOUS
  Filled 2018-01-15: qty 250

## 2018-01-15 MED ORDER — SODIUM CHLORIDE 0.9% FLUSH
10.0000 mL | INTRAVENOUS | Status: DC | PRN
  Administered 2018-01-15: 10 mL
  Filled 2018-01-15: qty 10

## 2018-01-15 MED ORDER — DECITABINE CHEMO INJECTION 50 MG
20.0000 mg/m2 | Freq: Once | INTRAVENOUS | Status: AC
  Administered 2018-01-15: 45 mg via INTRAVENOUS
  Filled 2018-01-15: qty 9

## 2018-01-15 MED ORDER — HEPARIN SOD (PORK) LOCK FLUSH 100 UNIT/ML IV SOLN
500.0000 [IU] | Freq: Once | INTRAVENOUS | Status: AC | PRN
  Administered 2018-01-15: 500 [IU]
  Filled 2018-01-15: qty 5

## 2018-01-15 NOTE — Patient Instructions (Signed)
Decitabine injection for infusion What is this medicine? DECITABINE (dee SYE ta been) is a chemotherapy drug. This medicine reduces the growth of cancer cells. It is used to treat adults with myelodysplastic syndromes. This medicine may be used for other purposes; ask your health care provider or pharmacist if you have questions. COMMON BRAND NAME(S): Dacogen What should I tell my health care provider before I take this medicine? They need to know if you have any of these conditions: -infection (especially a virus infection such as chickenpox, cold sores, or herpes) -kidney disease -liver disease -an unusual or allergic reaction to decitabine, other medicines, foods, dyes, or preservatives -pregnant or trying to get pregnant -breast-feeding How should I use this medicine? This medicine is for infusion into a vein. It is administered in a hospital or clinic by a doctor or health care professional. Talk to your pediatrician regarding the use of this medicine in children. Special care may be needed. Overdosage: If you think you have taken too much of this medicine contact a poison control center or emergency room at once. NOTE: This medicine is only for you. Do not share this medicine with others. What if I miss a dose? It is important not to miss your dose. Call your doctor or health care professional if you are unable to keep an appointment. What may interact with this medicine? -vaccines Talk to your doctor or health care professional before taking any of these medicines: -aspirin -acetaminophen -ibuprofen -ketoprofen -naproxen This list may not describe all possible interactions. Give your health care provider a list of all the medicines, herbs, non-prescription drugs, or dietary supplements you use. Also tell them if you smoke, drink alcohol, or use illegal drugs. Some items may interact with your medicine. What should I watch for while using this medicine? Visit your doctor for  checks on your progress. This drug may make you feel generally unwell. This is not uncommon, as chemotherapy can affect healthy cells as well as cancer cells. Report any side effects. Continue your course of treatment even though you feel ill unless your doctor tells you to stop. In some cases, you may be given additional medicines to help with side effects. Follow all directions for their use. Call your doctor or health care professional for advice if you get a fever, chills or sore throat, or other symptoms of a cold or flu. Do not treat yourself. This drug decreases your body's ability to fight infections. Try to avoid being around people who are sick. This medicine may increase your risk to bruise or bleed. Call your doctor or health care professional if you notice any unusual bleeding. Do not become pregnant while taking this medicine or for at least 1 month after stopping it. Women should inform their doctor if they wish to become pregnant or think they might be pregnant. Men should not father a child while taking this medicine and for at least 2 months after stopping it. There is a potential for serious side effects to an unborn child. Talk to your health care professional or pharmacist for more information. Do not breast-feed an infant while taking this medicine. What side effects may I notice from receiving this medicine? Side effects that you should report to your doctor or health care professional as soon as possible: -low blood counts - this medicine may decrease the number of white blood cells, red blood cells and platelets. You may be at increased risk for infections and bleeding. -signs of infection - fever or   chills, cough, sore throat, pain or difficulty passing urine -signs of decreased platelets or bleeding - bruising, pinpoint red spots on the skin, black, tarry stools, blood in the urine -signs of decreased red blood cells - unusual weakness or tiredness, fainting spells,  lightheadedness -increased blood sugar Side effects that usually do not require medical attention (report to your doctor or health care professional if they continue or are bothersome): -constipation -diarrhea -headache -loss of appetite -nausea, vomiting -skin rash, itching -stomach pain -water retention -weak or tired This list may not describe all possible side effects. Call your doctor for medical advice about side effects. You may report side effects to FDA at 1-800-FDA-1088. Where should I keep my medicine? This drug is given in a hospital or clinic and will not be stored at home. NOTE: This sheet is a summary. It may not cover all possible information. If you have questions about this medicine, talk to your doctor, pharmacist, or health care provider.  2018 Elsevier/Gold Standard (2015-06-30 15:52:57)  

## 2018-01-16 ENCOUNTER — Inpatient Hospital Stay: Payer: Medicare Other

## 2018-01-16 VITALS — BP 136/64 | HR 68 | Temp 98.1°F | Resp 19

## 2018-01-16 DIAGNOSIS — D4620 Refractory anemia with excess of blasts, unspecified: Secondary | ICD-10-CM

## 2018-01-16 DIAGNOSIS — Z5111 Encounter for antineoplastic chemotherapy: Secondary | ICD-10-CM | POA: Diagnosis not present

## 2018-01-16 MED ORDER — SODIUM CHLORIDE 0.9% FLUSH
10.0000 mL | INTRAVENOUS | Status: DC | PRN
  Administered 2018-01-16: 10 mL
  Filled 2018-01-16: qty 10

## 2018-01-16 MED ORDER — SODIUM CHLORIDE 0.9 % IV SOLN
20.0000 mg/m2 | Freq: Once | INTRAVENOUS | Status: AC
  Administered 2018-01-16: 45 mg via INTRAVENOUS
  Filled 2018-01-16: qty 9

## 2018-01-16 MED ORDER — SODIUM CHLORIDE 0.9 % IV SOLN
Freq: Once | INTRAVENOUS | Status: AC
  Administered 2018-01-16: 10:00:00 via INTRAVENOUS
  Filled 2018-01-16: qty 250

## 2018-01-16 MED ORDER — HEPARIN SOD (PORK) LOCK FLUSH 100 UNIT/ML IV SOLN
500.0000 [IU] | Freq: Once | INTRAVENOUS | Status: AC | PRN
  Administered 2018-01-16: 500 [IU]
  Filled 2018-01-16: qty 5

## 2018-01-16 NOTE — Patient Instructions (Signed)
Decitabine injection for infusion What is this medicine? DECITABINE (dee SYE ta been) is a chemotherapy drug. This medicine reduces the growth of cancer cells. It is used to treat adults with myelodysplastic syndromes. This medicine may be used for other purposes; ask your health care provider or pharmacist if you have questions. COMMON BRAND NAME(S): Dacogen What should I tell my health care provider before I take this medicine? They need to know if you have any of these conditions: -infection (especially a virus infection such as chickenpox, cold sores, or herpes) -kidney disease -liver disease -an unusual or allergic reaction to decitabine, other medicines, foods, dyes, or preservatives -pregnant or trying to get pregnant -breast-feeding How should I use this medicine? This medicine is for infusion into a vein. It is administered in a hospital or clinic by a doctor or health care professional. Talk to your pediatrician regarding the use of this medicine in children. Special care may be needed. Overdosage: If you think you have taken too much of this medicine contact a poison control center or emergency room at once. NOTE: This medicine is only for you. Do not share this medicine with others. What if I miss a dose? It is important not to miss your dose. Call your doctor or health care professional if you are unable to keep an appointment. What may interact with this medicine? -vaccines Talk to your doctor or health care professional before taking any of these medicines: -aspirin -acetaminophen -ibuprofen -ketoprofen -naproxen This list may not describe all possible interactions. Give your health care provider a list of all the medicines, herbs, non-prescription drugs, or dietary supplements you use. Also tell them if you smoke, drink alcohol, or use illegal drugs. Some items may interact with your medicine. What should I watch for while using this medicine? Visit your doctor for  checks on your progress. This drug may make you feel generally unwell. This is not uncommon, as chemotherapy can affect healthy cells as well as cancer cells. Report any side effects. Continue your course of treatment even though you feel ill unless your doctor tells you to stop. In some cases, you may be given additional medicines to help with side effects. Follow all directions for their use. Call your doctor or health care professional for advice if you get a fever, chills or sore throat, or other symptoms of a cold or flu. Do not treat yourself. This drug decreases your body's ability to fight infections. Try to avoid being around people who are sick. This medicine may increase your risk to bruise or bleed. Call your doctor or health care professional if you notice any unusual bleeding. Do not become pregnant while taking this medicine or for at least 1 month after stopping it. Women should inform their doctor if they wish to become pregnant or think they might be pregnant. Men should not father a child while taking this medicine and for at least 2 months after stopping it. There is a potential for serious side effects to an unborn child. Talk to your health care professional or pharmacist for more information. Do not breast-feed an infant while taking this medicine. What side effects may I notice from receiving this medicine? Side effects that you should report to your doctor or health care professional as soon as possible: -low blood counts - this medicine may decrease the number of white blood cells, red blood cells and platelets. You may be at increased risk for infections and bleeding. -signs of infection - fever or   chills, cough, sore throat, pain or difficulty passing urine -signs of decreased platelets or bleeding - bruising, pinpoint red spots on the skin, black, tarry stools, blood in the urine -signs of decreased red blood cells - unusual weakness or tiredness, fainting spells,  lightheadedness -increased blood sugar Side effects that usually do not require medical attention (report to your doctor or health care professional if they continue or are bothersome): -constipation -diarrhea -headache -loss of appetite -nausea, vomiting -skin rash, itching -stomach pain -water retention -weak or tired This list may not describe all possible side effects. Call your doctor for medical advice about side effects. You may report side effects to FDA at 1-800-FDA-1088. Where should I keep my medicine? This drug is given in a hospital or clinic and will not be stored at home. NOTE: This sheet is a summary. It may not cover all possible information. If you have questions about this medicine, talk to your doctor, pharmacist, or health care provider.  2018 Elsevier/Gold Standard (2015-06-30 15:52:57)  

## 2018-01-17 ENCOUNTER — Inpatient Hospital Stay: Payer: Medicare Other

## 2018-01-17 VITALS — BP 137/61 | HR 98 | Temp 97.7°F | Resp 17

## 2018-01-17 DIAGNOSIS — D4620 Refractory anemia with excess of blasts, unspecified: Secondary | ICD-10-CM

## 2018-01-17 DIAGNOSIS — Z5111 Encounter for antineoplastic chemotherapy: Secondary | ICD-10-CM | POA: Diagnosis not present

## 2018-01-17 MED ORDER — SODIUM CHLORIDE 0.9 % IV SOLN
Freq: Once | INTRAVENOUS | Status: AC
  Administered 2018-01-17: 10:00:00 via INTRAVENOUS
  Filled 2018-01-17: qty 250

## 2018-01-17 MED ORDER — SODIUM CHLORIDE 0.9 % IV SOLN
20.0000 mg/m2 | Freq: Once | INTRAVENOUS | Status: AC
  Administered 2018-01-17: 45 mg via INTRAVENOUS
  Filled 2018-01-17: qty 9

## 2018-01-17 MED ORDER — SODIUM CHLORIDE 0.9% FLUSH
10.0000 mL | INTRAVENOUS | Status: DC | PRN
  Administered 2018-01-17: 10 mL
  Filled 2018-01-17: qty 10

## 2018-01-17 MED ORDER — HEPARIN SOD (PORK) LOCK FLUSH 100 UNIT/ML IV SOLN
500.0000 [IU] | Freq: Once | INTRAVENOUS | Status: AC | PRN
  Administered 2018-01-17: 500 [IU]
  Filled 2018-01-17: qty 5

## 2018-02-05 ENCOUNTER — Encounter: Payer: Self-pay | Admitting: Pulmonary Disease

## 2018-02-05 ENCOUNTER — Ambulatory Visit: Payer: Medicare Other | Admitting: Pulmonary Disease

## 2018-02-05 ENCOUNTER — Ambulatory Visit (INDEPENDENT_AMBULATORY_CARE_PROVIDER_SITE_OTHER)
Admission: RE | Admit: 2018-02-05 | Discharge: 2018-02-05 | Disposition: A | Discharge status: Home / Self Care | Payer: Medicare Other | Source: Ambulatory Visit | Attending: Pulmonary Disease | Admitting: Pulmonary Disease

## 2018-02-05 ENCOUNTER — Other Ambulatory Visit (INDEPENDENT_AMBULATORY_CARE_PROVIDER_SITE_OTHER): Payer: Medicare Other

## 2018-02-05 VITALS — BP 108/64 | HR 113 | Ht 69.0 in | Wt 197.4 lb

## 2018-02-05 DIAGNOSIS — J411 Mucopurulent chronic bronchitis: Secondary | ICD-10-CM

## 2018-02-05 DIAGNOSIS — R0600 Dyspnea, unspecified: Secondary | ICD-10-CM

## 2018-02-05 DIAGNOSIS — R5381 Other malaise: Secondary | ICD-10-CM

## 2018-02-05 LAB — BRAIN NATRIURETIC PEPTIDE: PRO B NATRI PEPTIDE: 58 pg/mL (ref 0.0–100.0)

## 2018-02-05 MED ORDER — ALBUTEROL SULFATE (2.5 MG/3ML) 0.083% IN NEBU: 2.5000 mg | INHALATION_SOLUTION | Freq: Four times a day (QID) | RESPIRATORY_TRACT | 12 refills | Status: DC | PRN

## 2018-02-05 NOTE — Progress Notes (Signed)
Subjective:    Patient ID: Anthony [name], male    DOB: [birth year], 77 y.o.   MRN: [id number]  Synopsis: COPD formerly followed by Dr. Gwenette Greet Quit smoking around 1998 after smoking 2 ppd for about 40 years. No improvement with the addition of arcapta No different on stiolto breo 100 added to spiriva 06/16/14 >> no better. Symbicort trial 09/2014 >> +response  HPI Chief Complaint  Patient presents with  . Follow-up    breathing like a choo-choo train, back injury in april, breathing has gotten progressively worse   Worsening dyspnea: > started in April and has ben going downhill since then > he had a "back accident" then and has been feeling poorly since then > just walking to the bathroom in his house is difficulty > no associated chest tightness or pain > there is some associated wheezing > He feels chest congestion and feels like there is mucus in his chest > he continues to take symbicort 2 puffs twice  > he has used albuterol a lot lately because of the dyspnea> he says that he has used it more in the last 4 month  He denies any bronchitis or pneumonia.  H has noticed some leg swelling lately, he has been to the hospital for this.  He says that he has been on prednisone for years for his chemotherapy treatment.  He has known coronary artery disease.    Past Medical History:  Diagnosis Date  . Chronic kidney disease 2015  . Coronary atherosclerosis of native coronary artery   . Depressive disorder   . HTN (hypertension)   . Hyperlipidemia   . Iron deficiency anemia, unspecified 01/15/2014  . LPRD (laryngopharyngeal reflux disease)   . 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 Negative for chills and fever.  HENT: Negative for postnasal drip, sinus pressure and sinus pain.   Respiratory: Positive for shortness of breath and wheezing. Negative for cough and chest tightness.   Cardiovascular: Positive for  leg swelling. Negative for chest pain and palpitations.       Objective:   Physical Exam  Vitals:   02/05/18 1430  BP: 108/64  Pulse: (!) 113  SpO2: 94%  Weight: 197 lb 6.4 oz (89.5 kg)  Height: 5\' 9"  (1.753 m)    RA  Gen: chronically ill appearing, in wheelchair HENT: OP clear, TM's clear, neck supple PULM: Poor air movement B, normal percussion CV: RRR, no mgr, trace edema GI: BS+, soft, nontender Derm: no cyanosis or rash Psyche: normal mood and affect   CBC    Component Value Date/Time   WBC 4.6 01/13/2018 0920   WBC 3.6 (L) 06/05/2016 0645   RBC 3.04 (L) 01/13/2018 0920   RBC 3.11 (L) 01/13/2018 0920   HGB 10.5 (L) 01/13/2018 0920   HGB 11.5 (L) 05/06/2017 0901   HCT 32.7 (L) 01/13/2018 0920   HCT 35.8 (L) 05/06/2017 0901   PLT 155 01/13/2018 0920   PLT 213 05/06/2017 0901   MCV 107.6 (H) 01/13/2018 0920   MCV 106 (H) 05/06/2017 0901   MCH 34.5 (H) 01/13/2018 0920   MCHC 32.1 01/13/2018 0920   RDW 16.9 (H) 01/13/2018 0920   RDW 16.1 (H) 05/06/2017 0901   LYMPHSABS 0.9 01/13/2018 0920   LYMPHSABS 1.0 05/06/2017 0901   MONOABS 0.5 01/13/2018 0920   EOSABS 0.0 01/13/2018 0920   EOSABS 0.1 05/06/2017 0901   BASOSABS 0.0 01/13/2018 0920  BASOSABS 0.0 05/06/2017 0901    Imaging: CT 2011:  No significant emphysematous changes.   PFT Arlyce Harman 05/2012:  FEV1 1.67 (46%), ratio 60, decreased FVC either due to restriction or airtrapping.   Arlyce Harman 09/2014:  FEV1 1.73 (49%), ratio 60.  Cardiac Myocardial perfusion imaging study 09/2013:  Low risk study  Records from his visit with hematology reviewed where he is being treated for myelodysplasia.       Assessment & Plan:   No diagnosis found.  Discussion: Mr. Ickes comes to see me today for the first time in about 3 years for evaluation of shortness of breath.  Deconditioning, severe airflow obstruction and anemia are among many of the known causes for his current shortness of breath.  It is not clear to  me if there is another underlying lung problem so I think it is important for [place] to check a chest x-ray and lung function test to make sure were not missing something else.  He does not appear to be grossly volume overloaded on exam though he has had problems with leg swelling this year.  Plan: Worsening shortness of breath: We will check a lung function test We will check a chest x-ray We will check a blood test called a proBNP to see if you have any evidence of heart failure We will arrange for a home oxygen assessment  Physical deconditioning: If the blood work and other tests we do are normal then I think it may be a good idea for you to exercise more frequently  History of cardiac disease: If my work-up shows no evidence of worsening lung disease then you may need to have an echocardiogram and go back and see cardiology again to make sure your heart is not a cause of your shortness of breath  Severe COPD: Continue Symbicort Continue Spiriva Use albuterol nebulized as needed for chest tightness wheezing or shortness of breath:  We will see you back in 2 weeks to go over these results    Current Outpatient Medications:  .  Albuterol Sulfate (PROAIR RESPICLICK) 716 (90 BASE) MCG/ACT AEPB, Inhale 2 puffs into the lungs every 6 (six) hours as needed., Disp: , Rfl:  .  bimatoprost (LUMIGAN) 0.01 % SOLN, Place 1 drop into both eyes at bedtime., Disp: , Rfl:  .  budesonide-formoterol (SYMBICORT) 160-4.5 MCG/ACT inhaler, Inhale 2 puffs into the lungs 2 (two) times daily., Disp: 3 Inhaler, Rfl: 3 .  Calcium Citrate (CITRACAL PO), Take 1,000 Units by mouth 2 (two) times daily. , Disp: , Rfl:  .  Cholecalciferol (VITAMIN D3) 5000 UNITS TABS, Take by mouth daily., Disp: , Rfl:  .  clopidogrel (PLAVIX) 75 MG tablet, Take 75 mg by mouth daily. , Disp: , Rfl:  .  co-enzyme Q-10 30 MG capsule, Take 100 mg by mouth daily., Disp: , Rfl:  .  Cyanocobalamin (VITAMIN B-12) 2500 MCG TABS, Take 5,000  mcg by mouth daily., Disp: 90 tablet, Rfl: 9 .  fish oil-omega-3 fatty acids 1000 MG capsule, Take 1 g by mouth daily., Disp: , Rfl:  .  glucosamine-chondroitin 500-400 MG tablet, Take 1 tablet by mouth daily., Disp: , Rfl:  .  LORazepam (ATIVAN) 0.5 MG tablet, Take 1 tablet (0.5 mg total) by mouth every 6 (six) hours as needed (Nausea or vomiting)., Disp: 90 tablet, Rfl: 0 .  magnesium oxide (MAG-OX) 400 MG tablet, Take 400 mg by mouth daily., Disp: , Rfl:  .  Menthol, Topical Analgesic, 2.5 % GEL, Apply topically.,  Disp: , Rfl:  .  pantoprazole (PROTONIX) 40 MG tablet, Take 40 mg by mouth daily., Disp: , Rfl:  .  predniSONE (DELTASONE) 20 MG tablet, Take 1 tablet (20 mg total) by mouth daily with breakfast., Disp: 30 tablet, Rfl: 6 .  prochlorperazine (COMPAZINE) 10 MG tablet, Take 1 tablet (10 mg total) by mouth every 6 (six) hours as needed (Nausea or vomiting)., Disp: 60 tablet, Rfl: 3 .  sertraline (ZOLOFT) 100 MG tablet, Take 100 mg by mouth daily., Disp: , Rfl: 1 .  SIMBRINZA 1-0.2 % SUSP, , Disp: , Rfl:  .  simvastatin (ZOCOR) 40 MG tablet, Take 40 mg by mouth every other day. , Disp: , Rfl:  .  tamsulosin (FLOMAX) 0.4 MG CAPS capsule, , Disp: , Rfl:  .  tiotropium (SPIRIVA) 18 MCG inhalation capsule, Place 18 mcg into inhaler and inhale daily., Disp: , Rfl:  .  triamcinolone cream (KENALOG) 0.1 %, APPLY TO RASH/ITCH ON ARMS TWICE DAILY AS NEEDED, Disp: , Rfl: 2 .  buPROPion (WELLBUTRIN XL) 150 MG 24 hr tablet, Take 150 mg by mouth daily., Disp: , Rfl:  .  gabapentin (NEURONTIN) 300 MG capsule, Take 300 mg by mouth daily., Disp: , Rfl:  .  HYDROcodone-acetaminophen (NORCO) 7.5-325 MG tablet, TAKE 1 TABLET BY MOUTH AS NEEDED EVERY 6 HOURS FOR 7 DAYS, Disp: , Rfl: 0 .  ranitidine (ZANTAC) 300 MG tablet, Take 300 mg by mouth at bedtime. , Disp: , Rfl:  No current facility-administered medications for this visit.   Facility-Administered Medications Ordered in Other Visits:  .  sodium  chloride flush (NS) 0.9 % injection 10 mL, 10 mL, Intravenous, PRN, Volanda Napoleon, MD, 10 mL at 04/16/16 [phone]

## 2018-02-05 NOTE — Patient Instructions (Signed)
Worsening shortness of breath: We will check a lung function test We will check a chest x-ray We will check a blood test called a proBNP to see if you have any evidence of heart failure We will arrange for a home oxygen assessment  Physical deconditioning: If the blood work and other tests we do are normal then I think it may be a good idea for you to exercise more frequently  History of cardiac disease: If my work-up shows no evidence of worsening lung disease then you may need to have an echocardiogram and go back and see cardiology again to make sure your heart is not a cause of your shortness of breath  Severe COPD: Continue Symbicort Continue Spiriva Use albuterol nebulized as needed for chest tightness wheezing or shortness of breath:  We will see you back in 2 weeks to go over these results

## 2018-02-07 ENCOUNTER — Telehealth: Payer: Self-pay | Admitting: Pulmonary Disease

## 2018-02-07 NOTE — Telephone Encounter (Signed)
Called and left message for Corene Cornea, [facility] to call back.

## 2018-02-11 ENCOUNTER — Telehealth: Payer: Self-pay | Admitting: Pulmonary Disease

## 2018-02-11 NOTE — Telephone Encounter (Signed)
Attempted to call pharmacy back, twice. The first call I was on hold so long it hung up on me. The second call after the 6 minute mark I left a message to call us back.

## 2018-02-11 NOTE — Telephone Encounter (Signed)
Spoke to patient's wife and she would like to keep it all in one day. Nothing further is needed at this time.

## 2018-02-11 NOTE — Telephone Encounter (Signed)
Per Corene Cornea at [facility] he has to be qualified in office first. Appt for 03/06/2018, would you like he seen sooner to qualify?  BQ please advise.

## 2018-02-11 NOTE — Telephone Encounter (Signed)
OK, as long as he is willing to wait that long. The alternative would be to do a 6 min walk in the office now if he wants to come back in, but I doubt they will want to do that because his limited mobility was the whole reason why I wanted to try to do it in his home.

## 2018-02-11 NOTE — Telephone Encounter (Signed)
  Ask [facility]'s if any other company will do it in home, if not then Schedule 6 min walk prior to that visit

## 2018-02-11 NOTE — Telephone Encounter (Signed)
I was trying to have someone walk him in his home to see if his O2 saturation drops there to qualify him.  Is that possible?

## 2018-02-11 NOTE — Telephone Encounter (Signed)
Followed up with P[facility] and [employer] could potentially send someone out but they don't just do a qualifying walk, they would do a PFT and assessment for billing purposes. [facility] said through a home health nurse they might be able to go to the home. While the [facility] were working on this the wife had called and wanted to talk about qualifying. I went ahead and scheduled patient for a qualifying walk on 03/06/18 at 10:30 prior to PFT and OV.    Dr. Lake Bells what do you prefer to do?

## 2018-02-11 NOTE — Telephone Encounter (Signed)
Patient's wife at [phone] would like to be called after we have spoken to pharmacy.

## 2018-02-11 NOTE — Telephone Encounter (Signed)
Jason is calling back [phone] Ext#[phone] 

## 2018-02-11 NOTE — Telephone Encounter (Signed)
Called and spoke with Corene Cornea, he stated that the patient does not have o2 with [facility]. They cannot test the patient on POC without him getting o2 from them. Patient received a hospital bed from [facility] back in May but no o2. Corene Cornea also stated that they cannot go out to the patients home to test him for a POC he would need to come to the store to have that done.  Called patient to see where he got his o2 from as Corene Cornea stated that he sees where the patient is currently on o2 and I was unable to reach him. LMTCB.

## 2018-02-11 NOTE — Telephone Encounter (Signed)
See phone note from 9.3.19  Called and spoke with patients wife (DPR) advised her of note from earlier with Corene Cornea [facility]). Patient was not walked in the office visit therefore we were unable to qualify him for o2 and the home care company will not be able to provide him with o2 use. Patient will be back in the office on 9.26.19 and we can qualify him then. Will route this to BJT and BQ as an FYI.

## 2018-02-11 NOTE — Telephone Encounter (Signed)
Called Anthony [name] unable to reach left message to give [place] a call back.

## 2018-02-11 NOTE — Telephone Encounter (Signed)
See other note, closing this message due to multiple notes open.

## 2018-02-12 ENCOUNTER — Telehealth: Payer: Self-pay

## 2018-02-12 ENCOUNTER — Telehealth: Payer: Self-pay | Admitting: Pulmonary Disease

## 2018-02-12 MED ORDER — ALBUTEROL SULFATE (2.5 MG/3ML) 0.083% IN NEBU: 2.5000 mg | INHALATION_SOLUTION | Freq: Four times a day (QID) | RESPIRATORY_TRACT | 12 refills | Status: AC | PRN

## 2018-02-12 NOTE — Telephone Encounter (Signed)
Spoke with [employer], tech voiced that we just need to send over a new script with correct quantity. New script sent. Nothing further needed at this time.

## 2018-02-12 NOTE — Telephone Encounter (Signed)
Called wife and gave update, informed her that prescription has been sent and pharmacy was aware of everything. Voiced understanding. Nothing further needed at this time.

## 2018-02-12 NOTE — Telephone Encounter (Signed)
Phoned pharmacy to be sure they received prescription. Per pharmacy tech script was received and they would be calling her shortly to collect her co-pay. Wife phoned and made aware. Voiced understanding. Nothing further needed at this time.

## 2018-02-12 NOTE — Telephone Encounter (Signed)
Patient wife called back and stated [facility] stated they still needed updated quantity information regarding albuterol neb script. Spoke with pharmacists Sonia Baller, per Sonia Baller the new updated order that was faxed had not yet been logged into system so she was going to take a verbal order to get the patient his medication. Will hold in triage until able to call pharmacy back to be sure medication is being shipped out.

## 2018-02-12 NOTE — Telephone Encounter (Signed)
Per [place], patient has already been informed of everything from previous message. Will close this encounter.

## 2018-02-14 ENCOUNTER — Other Ambulatory Visit: Payer: Self-pay | Admitting: Family

## 2018-02-14 DIAGNOSIS — M810 Age-related osteoporosis without current pathological fracture: Secondary | ICD-10-CM | POA: Insufficient documentation

## 2018-02-14 DIAGNOSIS — M4850XA Collapsed vertebra, not elsewhere classified, site unspecified, initial encounter for fracture: Secondary | ICD-10-CM

## 2018-02-14 DIAGNOSIS — M8000XG Age-related osteoporosis with current pathological fracture, unspecified site, subsequent encounter for fracture with delayed healing: Secondary | ICD-10-CM

## 2018-02-17 ENCOUNTER — Other Ambulatory Visit: Payer: Self-pay | Admitting: *Deleted

## 2018-02-17 ENCOUNTER — Inpatient Hospital Stay (HOSPITAL_BASED_OUTPATIENT_CLINIC_OR_DEPARTMENT_OTHER): Payer: Medicare Other | Admitting: Family

## 2018-02-17 ENCOUNTER — Encounter: Payer: Self-pay | Admitting: Family

## 2018-02-17 ENCOUNTER — Inpatient Hospital Stay: Payer: Medicare Other

## 2018-02-17 ENCOUNTER — Other Ambulatory Visit: Payer: Self-pay

## 2018-02-17 ENCOUNTER — Inpatient Hospital Stay: Payer: Medicare Other | Attending: Family

## 2018-02-17 VITALS — BP 96/48 | HR 103 | Temp 97.3°F | Wt 195.8 lb

## 2018-02-17 DIAGNOSIS — D631 Anemia in chronic kidney disease: Secondary | ICD-10-CM

## 2018-02-17 DIAGNOSIS — D469 Myelodysplastic syndrome, unspecified: Secondary | ICD-10-CM | POA: Diagnosis not present

## 2018-02-17 DIAGNOSIS — Z79899 Other long term (current) drug therapy: Secondary | ICD-10-CM | POA: Insufficient documentation

## 2018-02-17 DIAGNOSIS — D4620 Refractory anemia with excess of blasts, unspecified: Secondary | ICD-10-CM

## 2018-02-17 DIAGNOSIS — M4850XA Collapsed vertebra, not elsewhere classified, site unspecified, initial encounter for fracture: Secondary | ICD-10-CM

## 2018-02-17 DIAGNOSIS — D72819 Decreased white blood cell count, unspecified: Secondary | ICD-10-CM

## 2018-02-17 DIAGNOSIS — D4621 Refractory anemia with excess of blasts 1: Secondary | ICD-10-CM

## 2018-02-17 DIAGNOSIS — M8000XG Age-related osteoporosis with current pathological fracture, unspecified site, subsequent encounter for fracture with delayed healing: Secondary | ICD-10-CM

## 2018-02-17 DIAGNOSIS — D649 Anemia, unspecified: Secondary | ICD-10-CM

## 2018-02-17 DIAGNOSIS — D46Z Other myelodysplastic syndromes: Secondary | ICD-10-CM

## 2018-02-17 DIAGNOSIS — Z23 Encounter for immunization: Secondary | ICD-10-CM | POA: Diagnosis not present

## 2018-02-17 DIAGNOSIS — D508 Other iron deficiency anemias: Secondary | ICD-10-CM

## 2018-02-17 DIAGNOSIS — D630 Anemia in neoplastic disease: Secondary | ICD-10-CM

## 2018-02-17 DIAGNOSIS — D500 Iron deficiency anemia secondary to blood loss (chronic): Secondary | ICD-10-CM

## 2018-02-17 LAB — RETICULOCYTES
RBC.: 2.6 MIL/uL — ABNORMAL LOW (ref 4.20–5.82)
Retic Count, Absolute: 72.8 10*3/uL (ref 34.8–93.9)
Retic Ct Pct: 2.8 % — ABNORMAL HIGH (ref 0.8–1.8)

## 2018-02-17 LAB — SAVE SMEAR

## 2018-02-17 LAB — CBC WITH DIFFERENTIAL (CANCER CENTER ONLY)
Basophils Absolute: 0 10*3/uL (ref 0.0–0.1)
Basophils Relative: 1 %
EOS ABS: 0 10*3/uL (ref 0.0–0.5)
Eosinophils Relative: 1 %
HEMATOCRIT: 28.4 % — AB (ref 38.7–49.9)
HEMOGLOBIN: 8.9 g/dL — AB (ref 13.0–17.1)
LYMPHS ABS: 0.4 10*3/uL — AB (ref 0.9–3.3)
Lymphocytes Relative: 35 %
MCH: 34.2 pg — AB (ref 28.0–33.4)
MCHC: 31.3 g/dL — ABNORMAL LOW (ref 32.0–35.9)
MCV: 109.2 fL — ABNORMAL HIGH (ref 82.0–98.0)
MONO ABS: 0 10*3/uL — AB (ref 0.1–0.9)
MONOS PCT: 3 %
NEUTROS ABS: 0.7 10*3/uL — AB (ref 1.5–6.5)
NEUTROS PCT: 60 %
Platelet Count: 263 10*3/uL (ref 145–400)
RBC: 2.6 MIL/uL — ABNORMAL LOW (ref 4.20–5.70)
RDW: 16.1 % — AB (ref 11.1–15.7)
WBC Count: 1.1 10*3/uL — ABNORMAL LOW (ref 4.0–10.0)

## 2018-02-17 LAB — SAMPLE TO BLOOD BANK

## 2018-02-17 LAB — CMP (CANCER CENTER ONLY)
ALT: 31 U/L (ref 10–47)
AST: 24 U/L (ref 11–38)
Albumin: 3.4 g/dL — ABNORMAL LOW (ref 3.5–5.0)
Alkaline Phosphatase: 114 U/L — ABNORMAL HIGH (ref 26–84)
Anion gap: 5 (ref 5–15)
BUN: 28 mg/dL — ABNORMAL HIGH (ref 7–22)
CO2: 26 mmol/L (ref 18–33)
Calcium: 9.3 mg/dL (ref 8.0–10.3)
Chloride: 110 mmol/L — ABNORMAL HIGH (ref 98–108)
Creatinine: 1.8 mg/dL — ABNORMAL HIGH (ref 0.60–1.20)
Glucose, Bld: 121 mg/dL — ABNORMAL HIGH (ref 73–118)
Potassium: 4.4 mmol/L (ref 3.3–4.7)
Sodium: 141 mmol/L (ref 128–145)
Total Bilirubin: 0.6 mg/dL (ref 0.2–1.6)
Total Protein: 6.6 g/dL (ref 6.4–8.1)

## 2018-02-17 LAB — ABO/RH: ABO/RH(D): O POS

## 2018-02-17 LAB — PREPARE RBC (CROSSMATCH)

## 2018-02-17 MED ORDER — DARBEPOETIN ALFA 500 MCG/ML IJ SOSY: 400.0000 ug | PREFILLED_SYRINGE | Freq: Once | INTRAMUSCULAR | Status: DC

## 2018-02-17 MED ORDER — SODIUM CHLORIDE 0.9 % IV SOLN
Freq: Once | INTRAVENOUS | Status: AC
  Administered 2018-02-17: 12:00:00 via INTRAVENOUS
  Filled 2018-02-17: qty 250

## 2018-02-17 MED ORDER — DARBEPOETIN ALFA 200 MCG/ML IJ SOLN: 400.0000 ug | Freq: Once | INTRAMUSCULAR | Status: DC

## 2018-02-17 MED ORDER — DARBEPOETIN ALFA 200 MCG/0.4ML IJ SOSY
PREFILLED_SYRINGE | INTRAMUSCULAR | Status: AC
  Filled 2018-02-17: qty 0.8

## 2018-02-17 MED ORDER — DARBEPOETIN ALFA 200 MCG/0.4ML IJ SOSY
400.0000 ug | PREFILLED_SYRINGE | Freq: Once | INTRAMUSCULAR | Status: AC
  Administered 2018-02-17: 400 ug via SUBCUTANEOUS

## 2018-02-17 MED ORDER — ZOLEDRONIC ACID 4 MG/100ML IV SOLN
4.0000 mg | Freq: Once | INTRAVENOUS | Status: AC
  Administered 2018-02-17: 4 mg via INTRAVENOUS
  Filled 2018-02-17: qty 100

## 2018-02-17 NOTE — Progress Notes (Signed)
Hematology and Oncology Follow Up Visit  DERAK SCHURMAN [id number] 1941/02/10 77 y.o. 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 Steck is here today with his sweet wife for follow-up. He is not feeling well. He is quite pale today with Hgb of 8.9, down from 10.5 last month. His WBC count is also down to 1.2, platelets 263.  He is symptomatic with fatigue, weakness, SOB with exertion, dizziness and decreased appetite. He comes in today in a wheelchair.  He has not noted any blood loss. His skin is thin and he does bruise easily.  No fever, chills, n/v, cough, rash, chest pain, palpitations, abdominal pain or changes in bowel or bladder habits.  He is still having lower back pain from his compression fractures. He will start Zometa today.  No swelling, numbness or tingling in his extremities at this time.  No lymphadenopathy noted on exam.  He is staying well hydrated. His weight is stable.   ECOG Performance Status: 2 - Symptomatic, <50% confined to bed  Medications:  Allergies as of 02/17/2018   No Known Allergies     Medication List        Accurate as of 02/17/18 10:57 AM. Always use your most recent med list.          bimatoprost 0.01 % Soln Commonly known as:  LUMIGAN Place 1 drop into both eyes at bedtime.   budesonide-formoterol 160-4.5 MCG/ACT inhaler Commonly known as:  SYMBICORT Inhale 2 puffs into the lungs 2 (two) times daily.   buPROPion 150 MG 24 hr tablet Commonly known as:  WELLBUTRIN XL Take 150 mg by mouth daily.   CITRACAL PO Take 1,000 Units by mouth 2 (two) times daily.   clopidogrel 75 MG tablet Commonly known  as:  PLAVIX Take 75 mg by mouth daily.   co-enzyme Q-10 30 MG capsule Take 100 mg by mouth daily.   Cyanocobalamin 2500 MCG Tabs Take 5,000 mcg by mouth daily.   fish oil-omega-3 fatty acids 1000 MG capsule Take 1 g by mouth daily.   glucosamine-chondroitin 500-400 MG tablet Take 1 tablet by mouth daily.   HYDROcodone-acetaminophen 7.5-325 MG tablet Commonly known as:  NORCO TAKE 1 TABLET BY MOUTH AS NEEDED EVERY 6 HOURS FOR 7 DAYS   LORazepam 0.5 MG tablet Commonly known as:  ATIVAN Take 1 tablet (0.5 mg total) by mouth every 6 (six) hours as needed (Nausea or vomiting).   magnesium oxide 400 MG tablet Commonly known as:  MAG-OX Take 400 mg by mouth daily.   Menthol (Topical Analgesic) 2.5 % Gel Apply topically.   NEURONTIN 300 MG capsule Generic drug:  gabapentin Take 300 mg by mouth daily.   pantoprazole 40 MG tablet Commonly known as:  PROTONIX Take 40 mg by mouth daily.   predniSONE 20 MG tablet Commonly known as:  DELTASONE Take 1 tablet (20 mg total) by mouth daily with breakfast.   PROAIR RESPICLICK 149 (90 Base) MCG/ACT Aepb Generic drug:  Albuterol Sulfate Inhale 2 puffs into the lungs every 6 (six) hours as needed.   albuterol (2.5 MG/3ML) 0.083% nebulizer solution Commonly known  as:  PROVENTIL Take 3 mLs (2.5 mg total) by nebulization every 6 (six) hours as needed for wheezing or shortness of breath.   prochlorperazine 10 MG tablet Commonly known as:  COMPAZINE Take 1 tablet (10 mg total) by mouth every 6 (six) hours as needed (Nausea or vomiting).   ranitidine 300 MG tablet Commonly known as:  ZANTAC Take 300 mg by mouth at bedtime.   sertraline 100 MG tablet Commonly known as:  ZOLOFT Take 100 mg by mouth daily.   SIMBRINZA 1-0.2 % Susp Generic drug:  Brinzolamide-Brimonidine   simvastatin 40 MG tablet Commonly known as:  ZOCOR Take 40 mg by mouth every other day.   tamsulosin 0.4 MG Caps capsule Commonly known as:  FLOMAX     tiotropium 18 MCG inhalation capsule Commonly known as:  SPIRIVA Place 18 mcg into inhaler and inhale daily.   triamcinolone cream 0.1 % Commonly known as:  KENALOG APPLY TO RASH/ITCH ON ARMS TWICE DAILY AS NEEDED   Vitamin D3 5000 units Tabs Take by mouth daily.       Allergies: No Known Allergies  Past Medical History, Surgical history, Social history, and Family History were reviewed and updated.  Review of Systems: All other 10 point review of systems is negative.   Physical Exam:  vitals were not taken for this visit.   Wt Readings from Last 3 Encounters:  02/05/18 197 lb 6.4 oz (89.5 kg)  01/13/18 205 lb (93 kg)  11/11/17 206 lb (93.4 kg)    Ocular: Sclerae unicteric, pupils equal, round and reactive to light Ear-nose-throat: Oropharynx clear, dentition fair Lymphatic: No cervical, supraclavicular or axillary adenopathy Lungs no rales or rhonchi, good excursion bilaterally Heart regular rate and rhythm, no murmur appreciated Abd soft, nontender, positive bowel sounds, no liver or spleen tip palpated on exam, no fluid wave  MSK no focal spinal tenderness, no joint edema Neuro: non-focal, well-oriented, appropriate affect Breasts: Deferred   Lab Results  Component Value Date   WBC 4.6 01/13/2018   HGB 10.5 (L) 01/13/2018   HCT 32.7 (L) 01/13/2018   MCV 107.6 (H) 01/13/2018   PLT 155 01/13/2018   Lab Results  Component Value Date   FERRITIN 1,925 (H) 01/13/2018   IRON 96 01/13/2018   TIBC 266 01/13/2018   UIBC 170 01/13/2018   IRONPCTSAT 36 (L) 01/13/2018   Lab Results  Component Value Date   RETICCTPCT 1.4 01/13/2018   RBC 3.04 (L) 01/13/2018   RBC 3.11 (L) 01/13/2018   RETICCTABS 39.4 05/30/2015   Lab Results  Component Value Date   KAPLAMBRATIO 1.11 12/17/2016   Lab Results  Component Value Date   IGGSERUM 735 12/17/2016   IGMSERUM 79 12/17/2016   Lab Results  Component Value Date   TOTALPROTELP 7.3 01/01/2014   ALBUMINELP 55.1 (L)  01/01/2014   A1GS 6.9 (H) 01/01/2014   A2GS 8.9 01/01/2014   BETS 7.9 (H) 01/01/2014   BETA2SER 6.7 (H) 01/01/2014   GAMS 14.5 01/01/2014   MSPIKE Not Observed 12/17/2016   SPEI * 01/01/2014     Chemistry      Component Value Date/Time   NA 143 01/13/2018 0920   NA 141 05/06/2017 0901   NA 140 04/16/2016 0930   K 4.4 01/13/2018 0920   K 3.8 05/06/2017 0901   K 3.9 04/16/2016 0930   CL 103 01/13/2018 0920   CL 105 05/06/2017 0901   CO2 29 01/13/2018 0920   CO2 28 05/06/2017 0901   CO2 24  04/16/2016 0930   BUN 29 (H) 01/13/2018 0920   BUN 27 (H) 05/06/2017 0901   BUN 20.5 04/16/2016 0930   CREATININE 1.50 (H) 01/13/2018 0920   CREATININE 1.4 (H) 05/06/2017 0901   CREATININE 1.2 04/16/2016 0930      Component Value Date/Time   CALCIUM 9.4 01/13/2018 0920   CALCIUM 8.8 05/06/2017 0901   CALCIUM 8.9 04/16/2016 0930   ALKPHOS 97 (H) 01/13/2018 0920   ALKPHOS 46 05/06/2017 0901   ALKPHOS 54 04/16/2016 0930   AST 22 01/13/2018 0920   AST 16 04/16/2016 0930   ALT 24 01/13/2018 0920   ALT 25 05/06/2017 0901   ALT 21 04/16/2016 0930   BILITOT 0.6 01/13/2018 0920   BILITOT 0.27 04/16/2016 0930      Impression and Plan: Mr. Bellerose is a very pleasant 77 yo caucasian gentleman with myelodysplasia and refractory anemia. His counts today have dropped significantly since his last visit and he is quite symptomatic as mentioned above. I spoke with Dr. Marin Olp and we will get him scheduled for bone marrow biopsy.  We will also give him 2 units of blood on Thursday.  He was given Aranesp today as well as Zometa.  We will plan to see him back in the next couple of weeks once we have his bone marrow biopsy results.  He is in agreement with the plan and will contact our office with any questions or concerns. We can certainly  see him sooner if need be.   Laverna Peace, NP 9/9/201910:57 AM

## 2018-02-17 NOTE — Patient Instructions (Signed)
Zoledronic Acid injection (Hypercalcemia, Oncology) What is this medicine? ZOLEDRONIC ACID (ZOE le dron ik AS id) lowers the amount of calcium loss from bone. It is used to treat too much calcium in your blood from cancer. It is also used to prevent complications of cancer that has spread to the bone. This medicine may be used for other purposes; ask your health care provider or pharmacist if you have questions. COMMON BRAND NAME(S): Zometa What should I tell my health care provider before I take this medicine? They need to know if you have any of these conditions: -aspirin-sensitive asthma -cancer, especially if you are receiving medicines used to treat cancer -dental disease or wear dentures -infection -kidney disease -receiving corticosteroids like dexamethasone or prednisone -an unusual or allergic reaction to zoledronic acid, other medicines, foods, dyes, or preservatives -pregnant or trying to get pregnant -breast-feeding How should I use this medicine? This medicine is for infusion into a vein. It is given by a health care professional in a hospital or clinic setting. Talk to your pediatrician regarding the use of this medicine in children. Special care may be needed. Overdosage: If you think you have taken too much of this medicine contact a poison control center or emergency room at once. NOTE: This medicine is only for you. Do not share this medicine with others. What if I miss a dose? It is important not to miss your dose. Call your doctor or health care professional if you are unable to keep an appointment. What may interact with this medicine? -certain antibiotics given by injection -NSAIDs, medicines for pain and inflammation, like ibuprofen or naproxen -some diuretics like bumetanide, furosemide -teriparatide -thalidomide This list may not describe all possible interactions. Give your health care provider a list of all the medicines, herbs, non-prescription drugs, or  dietary supplements you use. Also tell them if you smoke, drink alcohol, or use illegal drugs. Some items may interact with your medicine. What should I watch for while using this medicine? Visit your doctor or health care professional for regular checkups. It may be some time before you see the benefit from this medicine. Do not stop taking your medicine unless your doctor tells you to. Your doctor may order blood tests or other tests to see how you are doing. Women should inform their doctor if they wish to become pregnant or think they might be pregnant. There is a potential for serious side effects to an unborn child. Talk to your health care professional or pharmacist for more information. You should make sure that you get enough calcium and vitamin D while you are taking this medicine. Discuss the foods you eat and the vitamins you take with your health care professional. Some people who take this medicine have severe bone, joint, and/or muscle pain. This medicine may also increase your risk for jaw problems or a broken thigh bone. Tell your doctor right away if you have severe pain in your jaw, bones, joints, or muscles. Tell your doctor if you have any pain that does not go away or that gets worse. Tell your dentist and dental surgeon that you are taking this medicine. You should not have major dental surgery while on this medicine. See your dentist to have a dental exam and fix any dental problems before starting this medicine. Take good care of your teeth while on this medicine. Make sure you see your dentist for regular follow-up appointments. What side effects may I notice from receiving this medicine? Side effects that   you should report to your doctor or health care professional as soon as possible: -allergic reactions like skin rash, itching or hives, swelling of the face, lips, or tongue -anxiety, confusion, or depression -breathing problems -changes in vision -eye pain -feeling faint or  lightheaded, falls -jaw pain, especially after dental work -mouth sores -muscle cramps, stiffness, or weakness -redness, blistering, peeling or loosening of the skin, including inside the mouth -trouble passing urine or change in the amount of urine Side effects that usually do not require medical attention (report to your doctor or health care professional if they continue or are bothersome): -bone, joint, or muscle pain -constipation -diarrhea -fever -hair loss -irritation at site where injected -loss of appetite -nausea, vomiting -stomach upset -trouble sleeping -trouble swallowing -weak or tired This list may not describe all possible side effects. Call your doctor for medical advice about side effects. You may report side effects to FDA at 1-800-FDA-1088. Where should I keep my medicine? This drug is given in a hospital or clinic and will not be stored at home. NOTE: This sheet is a summary. It may not cover all possible information. If you have questions about this medicine, talk to your doctor, pharmacist, or health care provider.  2018 Elsevier/Gold Standard (2013-10-24 14:19:39) Anemia Anemia is a condition in which you do not have enough red blood cells or hemoglobin. Hemoglobin is a substance in red blood cells that carries oxygen. When you do not have enough red blood cells or hemoglobin (are anemic), your body cannot get enough oxygen and your organs may not work properly. As a result, you may feel very tired or have other problems. What are the causes? Common causes of anemia include:  Excessive bleeding. Anemia can be caused by excessive bleeding inside or outside the body, including bleeding from the intestine or from periods in women.  Poor nutrition.  Long-lasting (chronic) kidney, thyroid, and liver disease.  Bone marrow disorders.  Cancer and treatments for cancer.  HIV (human immunodeficiency virus) and AIDS (acquired immunodeficiency  syndrome).  Treatments for HIV and AIDS.  Spleen problems.  Blood disorders.  Infections, medicines, and autoimmune disorders that destroy red blood cells.  What are the signs or symptoms? Symptoms of this condition include:  Minor weakness.  Dizziness.  Headache.  Feeling heartbeats that are irregular or faster than normal (palpitations).  Shortness of breath, especially with exercise.  Paleness.  Cold sensitivity.  Indigestion.  Nausea.  Difficulty sleeping.  Difficulty concentrating.  Symptoms may occur suddenly or develop slowly. If your anemia is mild, you may not have symptoms. How is this diagnosed? This condition is diagnosed based on:  Blood tests.  Your medical history.  A physical exam.  Bone marrow biopsy.  Your health care provider may also check your stool (feces) for blood and may do additional testing to look for the cause of your bleeding. You may also have other tests, including:  Imaging tests, such as a CT scan or MRI.  Endoscopy.  Colonoscopy.  How is this treated? Treatment for this condition depends on the cause. If you continue to lose a lot of blood, you may need to be treated at a hospital. Treatment may include:  Taking supplements of iron, vitamin M03, or folic acid.  Taking a hormone medicine (erythropoietin) that can help to stimulate red blood cell growth.  Having a blood transfusion. This may be needed if you lose a lot of blood.  Making changes to your diet.  Having surgery to remove  your spleen.  Follow these instructions at home:  Take over-the-counter and prescription medicines only as told by your health care provider.  Take supplements only as told by your health care provider.  Follow any diet instructions that you were given.  Keep all follow-up visits as told by your health care provider. This is important. Contact a health care provider if:  You develop new bleeding anywhere in the body. Get  help right away if:  You are very weak.  You are short of breath.  You have pain in your abdomen or chest.  You are dizzy or feel faint.  You have trouble concentrating.  You have bloody or black, tarry stools.  You vomit repeatedly or you vomit up blood. Summary  Anemia is a condition in which you do not have enough red blood cells or enough of a substance in your red blood cells that carries oxygen (hemoglobin).  Symptoms may occur suddenly or develop slowly.  If your anemia is mild, you may not have symptoms.  This condition is diagnosed with blood tests as well as a medical history and physical exam. Other tests may be needed.  Treatment for this condition depends on the cause of the anemia. This information is not intended to replace advice given to you by your health care provider. Make sure you discuss any questions you have with your health care provider. Document Released: 07/05/2004 Document Revised: 06/29/2016 Document Reviewed: 06/29/2016 Elsevier Interactive Patient Education  [employer].

## 2018-02-17 NOTE — Patient Instructions (Signed)
Implanted Port Insertion, Care After °This sheet gives you information about how to care for yourself after your procedure. Your health care provider may also give you more specific instructions. If you have problems or questions, contact your health care provider. °What can I expect after the procedure? °After your procedure, it is common to have: °· Discomfort at the port insertion site. °· Bruising on the skin over the port. This should improve over 3-4 days. ° °Follow these instructions at home: °Port care °· After your port is placed, you will get a manufacturer's information card. The card has information about your port. Keep this card with you at all times. °· Take care of the port as told by your health care provider. Ask your health care provider if you or a family member can get training for taking care of the port at home. A home health care nurse may also take care of the port. °· Make sure to remember what type of port you have. °Incision care °· Follow instructions from your health care provider about how to take care of your port insertion site. Make sure you: °? Wash your hands with soap and water before you change your bandage (dressing). If soap and water are not available, use hand sanitizer. °? Change your dressing as told by your health care provider. °? Leave stitches (sutures), skin glue, or adhesive strips in place. These skin closures may need to stay in place for 2 weeks or longer. If adhesive strip edges start to loosen and curl up, you may trim the loose edges. Do not remove adhesive strips completely unless your health care provider tells you to do that. °· Check your port insertion site every day for signs of infection. Check for: °? More redness, swelling, or pain. °? More fluid or blood. °? Warmth. °? Pus or a bad smell. °General instructions °· Do not take baths, swim, or use a hot tub until your health care provider approves. °· Do not lift anything that is heavier than 10 lb (4.5  kg) for a week, or as told by your health care provider. °· Ask your health care provider when it is okay to: °? Return to work or school. °? Resume usual physical activities or sports. °· Do not drive for 24 hours if you were given a medicine to help you relax (sedative). °· Take over-the-counter and prescription medicines only as told by your health care provider. °· Wear a medical alert bracelet in case of an emergency. This will tell any health care providers that you have a port. °· Keep all follow-up visits as told by your health care provider. This is important. °Contact a health care provider if: °· You cannot flush your port with saline as directed, or you cannot draw blood from the port. °· You have a fever or chills. °· You have more redness, swelling, or pain around your port insertion site. °· You have more fluid or blood coming from your port insertion site. °· Your port insertion site feels warm to the touch. °· You have pus or a bad smell coming from the port insertion site. °Get help right away if: °· You have chest pain or shortness of breath. °· You have bleeding from your port that you cannot control. °Summary °· Take care of the port as told by your health care provider. °· Change your dressing as told by your health care provider. °· Keep all follow-up visits as told by your health care provider. °  This information is not intended to replace advice given to you by your health care provider. Make sure you discuss any questions you have with your health care provider. °Document Released: 03/18/2013 Document Revised: 04/18/2016 Document Reviewed: 04/18/2016 °Elsevier Interactive Patient Education © 2017 [employer] ° °

## 2018-02-18 LAB — IRON AND TIBC
IRON: 98 ug/dL (ref 42–163)
SATURATION RATIOS: 39 % — AB (ref 42–163)
TIBC: 250 ug/dL (ref 202–409)
UIBC: 152 ug/dL

## 2018-02-18 LAB — FERRITIN: FERRITIN: 2478 ng/mL — AB (ref 24–336)

## 2018-02-18 LAB — ERYTHROPOIETIN: Erythropoietin: 152.5 m[IU]/mL — ABNORMAL HIGH (ref 2.6–18.5)

## 2018-02-20 ENCOUNTER — Inpatient Hospital Stay: Payer: Medicare Other

## 2018-02-20 DIAGNOSIS — D631 Anemia in chronic kidney disease: Secondary | ICD-10-CM

## 2018-02-20 DIAGNOSIS — D4620 Refractory anemia with excess of blasts, unspecified: Secondary | ICD-10-CM

## 2018-02-20 DIAGNOSIS — D4621 Refractory anemia with excess of blasts 1: Secondary | ICD-10-CM | POA: Diagnosis not present

## 2018-02-20 MED ORDER — ACETAMINOPHEN 325 MG PO TABS
ORAL_TABLET | ORAL | Status: AC
  Filled 2018-02-20: qty 2

## 2018-02-20 MED ORDER — DIPHENHYDRAMINE HCL 25 MG PO CAPS
ORAL_CAPSULE | ORAL | Status: AC
  Filled 2018-02-20: qty 1

## 2018-02-20 MED ORDER — ACETAMINOPHEN 325 MG PO TABS
650.0000 mg | ORAL_TABLET | Freq: Once | ORAL | Status: AC
  Administered 2018-02-20: 650 mg via ORAL

## 2018-02-20 MED ORDER — FUROSEMIDE 10 MG/ML IJ SOLN: 20.0000 mg | Freq: Once | INTRAMUSCULAR | Status: DC

## 2018-02-20 MED ORDER — DIPHENHYDRAMINE HCL 25 MG PO CAPS
25.0000 mg | ORAL_CAPSULE | Freq: Once | ORAL | Status: AC
  Administered 2018-02-20: 25 mg via ORAL

## 2018-02-20 NOTE — Patient Instructions (Signed)

## 2018-02-21 LAB — TYPE AND SCREEN
ABO/RH(D): O POS
ANTIBODY SCREEN: NEGATIVE
UNIT DIVISION: 0
Unit division: 0

## 2018-02-21 LAB — BPAM RBC
Blood Product Expiration Date: 201910082359
Blood Product Expiration Date: 201910082359
ISSUE DATE / TIME: 201909120925
ISSUE DATE / TIME: 201909120925
UNIT TYPE AND RH: 5100
Unit Type and Rh: 5100

## 2018-02-24 ENCOUNTER — Other Ambulatory Visit: Payer: Self-pay | Admitting: Radiology

## 2018-02-25 ENCOUNTER — Ambulatory Visit (HOSPITAL_COMMUNITY)
Admission: RE | Admit: 2018-02-25 | Discharge: 2018-02-25 | Disposition: A | Discharge status: Home / Self Care | Payer: Medicare Other | Source: Ambulatory Visit | Attending: Family | Admitting: Family

## 2018-02-25 ENCOUNTER — Other Ambulatory Visit: Payer: Self-pay

## 2018-02-25 ENCOUNTER — Encounter: Payer: Self-pay | Admitting: Hematology & Oncology

## 2018-02-25 ENCOUNTER — Encounter (HOSPITAL_COMMUNITY): Payer: Self-pay

## 2018-02-25 DIAGNOSIS — Z79899 Other long term (current) drug therapy: Secondary | ICD-10-CM | POA: Diagnosis not present

## 2018-02-25 DIAGNOSIS — F329 Major depressive disorder, single episode, unspecified: Secondary | ICD-10-CM | POA: Diagnosis not present

## 2018-02-25 DIAGNOSIS — E785 Hyperlipidemia, unspecified: Secondary | ICD-10-CM | POA: Insufficient documentation

## 2018-02-25 DIAGNOSIS — Z7951 Long term (current) use of inhaled steroids: Secondary | ICD-10-CM | POA: Diagnosis not present

## 2018-02-25 DIAGNOSIS — D72819 Decreased white blood cell count, unspecified: Secondary | ICD-10-CM | POA: Diagnosis not present

## 2018-02-25 DIAGNOSIS — Z7902 Long term (current) use of antithrombotics/antiplatelets: Secondary | ICD-10-CM | POA: Diagnosis not present

## 2018-02-25 DIAGNOSIS — N189 Chronic kidney disease, unspecified: Secondary | ICD-10-CM | POA: Insufficient documentation

## 2018-02-25 DIAGNOSIS — Z7952 Long term (current) use of systemic steroids: Secondary | ICD-10-CM | POA: Insufficient documentation

## 2018-02-25 DIAGNOSIS — D4620 Refractory anemia with excess of blasts, unspecified: Secondary | ICD-10-CM | POA: Diagnosis present

## 2018-02-25 DIAGNOSIS — I2510 Atherosclerotic heart disease of native coronary artery without angina pectoris: Secondary | ICD-10-CM | POA: Insufficient documentation

## 2018-02-25 DIAGNOSIS — I129 Hypertensive chronic kidney disease with stage 1 through stage 4 chronic kidney disease, or unspecified chronic kidney disease: Secondary | ICD-10-CM | POA: Insufficient documentation

## 2018-02-25 DIAGNOSIS — D539 Nutritional anemia, unspecified: Secondary | ICD-10-CM | POA: Insufficient documentation

## 2018-02-25 DIAGNOSIS — K219 Gastro-esophageal reflux disease without esophagitis: Secondary | ICD-10-CM | POA: Insufficient documentation

## 2018-02-25 DIAGNOSIS — D46Z Other myelodysplastic syndromes: Secondary | ICD-10-CM

## 2018-02-25 DIAGNOSIS — D649 Anemia, unspecified: Secondary | ICD-10-CM

## 2018-02-25 LAB — BASIC METABOLIC PANEL
Anion gap: 11 (ref 5–15)
BUN: 20 mg/dL (ref 8–23)
CALCIUM: 9 mg/dL (ref 8.9–10.3)
CO2: 26 mmol/L (ref 22–32)
CREATININE: 1.26 mg/dL — AB (ref 0.61–1.24)
Chloride: 104 mmol/L (ref 98–111)
GFR calc non Af Amer: 52 mL/min — ABNORMAL LOW (ref >60)
Glucose, Bld: 100 mg/dL — ABNORMAL HIGH (ref 70–99)
Potassium: 4.1 mmol/L (ref 3.5–5.1)
Sodium: 141 mmol/L (ref 135–145)

## 2018-02-25 LAB — CBC WITH DIFFERENTIAL/PLATELET
BASOS PCT: 1 %
Basophils Absolute: 0 10*3/uL (ref 0.0–0.1)
EOS ABS: 0 10*3/uL (ref 0.0–0.7)
Eosinophils Relative: 1 %
HCT: 37.7 % — ABNORMAL LOW (ref 39.0–52.0)
Hemoglobin: 12.2 g/dL — ABNORMAL LOW (ref 13.0–17.0)
Lymphocytes Relative: 44 %
Lymphs Abs: 0.6 10*3/uL — ABNORMAL LOW (ref 0.7–4.0)
MCH: 32.9 pg (ref 26.0–34.0)
MCHC: 32.4 g/dL (ref 30.0–36.0)
MCV: 101.6 fL — ABNORMAL HIGH (ref 78.0–100.0)
MONO ABS: 0.1 10*3/uL (ref 0.1–1.0)
MONOS PCT: 9 %
NEUTROS PCT: 45 %
Neutro Abs: 0.6 10*3/uL — ABNORMAL LOW (ref 1.7–7.7)
PLATELETS: 160 10*3/uL (ref 150–400)
RBC: 3.71 MIL/uL — ABNORMAL LOW (ref 4.22–5.81)
RDW: 18.4 % — ABNORMAL HIGH (ref 11.5–15.5)
WBC: 1.4 10*3/uL — CL (ref 4.0–10.5)

## 2018-02-25 LAB — PROTIME-INR
INR: 0.97
PROTHROMBIN TIME: 12.8 s (ref 11.4–15.2)

## 2018-02-25 MED ORDER — MIDAZOLAM HCL 2 MG/2ML IJ SOLN
INTRAMUSCULAR | Status: AC
  Filled 2018-02-25: qty 4

## 2018-02-25 MED ORDER — MIDAZOLAM HCL 2 MG/2ML IJ SOLN
INTRAMUSCULAR | Status: AC | PRN
  Administered 2018-02-25 (×2): 1 mg via INTRAVENOUS

## 2018-02-25 MED ORDER — SODIUM CHLORIDE 0.9 % IV SOLN
INTRAVENOUS | Status: DC
  Administered 2018-02-25: 07:00:00 via INTRAVENOUS

## 2018-02-25 MED ORDER — LIDOCAINE HCL (PF) 1 % IJ SOLN
INTRAMUSCULAR | Status: AC | PRN
  Administered 2018-02-25: 10 mL

## 2018-02-25 MED ORDER — FENTANYL CITRATE (PF) 100 MCG/2ML IJ SOLN
INTRAMUSCULAR | Status: AC | PRN
  Administered 2018-02-25 (×2): 50 ug via INTRAVENOUS

## 2018-02-25 MED ORDER — FENTANYL CITRATE (PF) 100 MCG/2ML IJ SOLN
INTRAMUSCULAR | Status: AC
  Filled 2018-02-25: qty 2

## 2018-02-25 NOTE — H&P (Signed)
Referring Physician(s): [place],Sarah M/Ennever,P  Supervising Physician: Daryll Brod  Patient Status:  [facility]  Chief Complaint:  "I'm having a bone marrow biopsy"  Subjective: Patient familiar to IR service from prior Port-A-Cath placement in 2017. He has  hx of myelodysplastic syndrome and now presents with progressive anemia and leukopenia.  He is scheduled today for CT-guided bone marrow biopsy for further evaluation.  He currently denies fever, headache, chest pain, cough, abdominal pain, nausea, vomiting or bleeding.  He does have some dyspnea with exertion, back pain and bruises easily.  He is also hard of hearing.  Past Medical History:  Diagnosis Date  . Chronic kidney disease 2015  . Coronary atherosclerosis of native coronary artery   . Depressive disorder   . HTN (hypertension)   . Hyperlipidemia   . Iron deficiency anemia, unspecified 01/15/2014  . LPRD (laryngopharyngeal reflux disease)   . Myelodysplasia, low grade ([place]) 01/15/2014   Past Surgical History:  Procedure Laterality Date  . ANGIOPLASTY    . BONE MARROW BIOPSY  2016  . COLONOSCOPY    . EYE SURGERY     cataract  . implanted port Right 04/2015  . TONSILLECTOMY     as a child       Allergies: Patient has no known allergies.  Medications: Prior to Admission medications   Medication Sig Start Date End Date Taking? Authorizing Provider  albuterol (PROVENTIL) (2.5 MG/3ML) 0.083% nebulizer solution Take 3 mLs (2.5 mg total) by nebulization every 6 (six) hours as needed for wheezing or shortness of breath. 02/12/18  Yes Juanito Doom, MD  Albuterol Sulfate (PROAIR RESPICLICK) 183 (90 BASE) MCG/ACT AEPB Inhale 2 puffs into the lungs every 6 (six) hours as needed.   Yes [provider]  bimatoprost (LUMIGAN) 0.01 % SOLN Place 1 drop into both eyes at bedtime.   Yes [provider]  budesonide-formoterol (SYMBICORT) 160-4.5 MCG/ACT inhaler Inhale 2 puffs into the lungs 2 (two)  times daily. 11/12/14  Yes Clance, Armando Reichert, MD  buPROPion (WELLBUTRIN XL) 150 MG 24 hr tablet Take 150 mg by mouth daily.   Yes [provider]  Calcium Citrate (CITRACAL PO) Take 1,000 Units by mouth 2 (two) times daily.    Yes [provider]  Cholecalciferol (VITAMIN D3) 5000 UNITS TABS Take by mouth daily.   Yes [provider]  clopidogrel (PLAVIX) 75 MG tablet Take 75 mg by mouth daily.  04/16/12  Yes [provider]  co-enzyme Q-10 30 MG capsule Take 100 mg by mouth daily.   Yes [provider]  Cyanocobalamin (VITAMIN B-12) 2500 MCG TABS Take 5,000 mcg by mouth daily. 06/16/14  Yes Volanda Napoleon, MD  fish oil-omega-3 fatty acids 1000 MG capsule Take 1 g by mouth daily.   Yes [provider]  gabapentin (NEURONTIN) 300 MG capsule Take 300 mg by mouth daily. 12/16/17  Yes [provider]  glucosamine-chondroitin 500-400 MG tablet Take 1 tablet by mouth daily.   Yes [provider]  Menthol, Topical Analgesic, 2.5 % GEL Apply topically.   Yes [provider]  pantoprazole (PROTONIX) 40 MG tablet Take 40 mg by mouth daily. 04/20/15  Yes [provider]  predniSONE (DELTASONE) 20 MG tablet Take 1 tablet (20 mg total) by mouth daily with breakfast. 06/25/17  Yes [place], Holli Humbles, NP  prochlorperazine (COMPAZINE) 10 MG tablet Take 1 tablet (10 mg total) by mouth every 6 (six) hours as needed (Nausea or vomiting). 11/11/17  Yes Volanda Napoleon, MD  ranitidine (ZANTAC) 300 MG tablet Take 300 mg by mouth at bedtime.  05/26/12  Yes [provider]  sertraline (ZOLOFT) 100 MG tablet Take 100 mg by mouth daily. 11/16/14  Yes [provider]  [place] 1-0.2 % SUSP  05/20/15  Yes [provider]  simvastatin (ZOCOR) 40 MG tablet Take 40 mg by mouth every other day.  03/25/12  Yes [provider]  tamsulosin (FLOMAX) 0.4 MG CAPS capsule  12/25/16  Yes [provider]  tiotropium  (SPIRIVA) 18 MCG inhalation capsule Place 18 mcg into inhaler and inhale daily.   Yes [provider]  triamcinolone cream (KENALOG) 0.1 % APPLY TO RASH/ITCH ON ARMS TWICE DAILY AS NEEDED 10/23/16  Yes [provider]  HYDROcodone-acetaminophen (NORCO) 7.5-325 MG tablet TAKE 1 TABLET BY MOUTH AS NEEDED EVERY 6 HOURS FOR 7 DAYS 12/11/17   [provider]  LORazepam (ATIVAN) 0.5 MG tablet Take 1 tablet (0.5 mg total) by mouth every 6 (six) hours as needed (Nausea or vomiting). 01/13/18   [place], Holli Humbles, NP  magnesium oxide (MAG-OX) 400 MG tablet Take 400 mg by mouth daily.    [provider]     Vital Signs: Blood pressure 124/79, heart rate 104, temp 98.1, respirations 18, O2 sat 95% room air   Physical Exam awake, alert.  Chest clear to auscultation bilaterally; clean, intact right chest wall Port-A-Cath.  Heart with regular rate and rhythm.  Abdomen soft, positive bowel sounds, nontender.  Trace pretibial edema bilaterally.  Scattered ecchymoses noted.  Imaging: No results found.  Labs:  CBC: Recent Labs    11/11/17 0828 01/13/18 0920 02/17/18 1037 02/25/18 0719  WBC 9.4 4.6 1.1* 1.4*  HGB 11.4* 10.5* 8.9* 12.2*  HCT 36.1* 32.7* 28.4* 37.7*  PLT 187 155 263 160    COAGS: Recent Labs    02/25/18 0719  INR 0.97    BMP: Recent Labs    11/11/17 0828 01/13/18 0920 02/17/18 1037 02/25/18 0719  NA 143 143 141 141  K 4.0 4.4 4.4 4.1  CL 107 103 110* 104  CO2 '27 29 26 26  ' GLUCOSE 123* 118 121* 100*  BUN 25* 29* 28* 20  CALCIUM 8.9 9.4 9.3 9.0  CREATININE 1.30* 1.50* 1.80* 1.26*  GFRNONAA  --   --   --  52*  GFRAA  --   --   --  >60    LIVER FUNCTION TESTS: Recent Labs    08/12/17 1030 11/11/17 0828 01/13/18 0920 02/17/18 1037  BILITOT 0.6 0.6 0.6 0.6  AST '22 21 22 24  ' ALT '22 21 24 31  ' ALKPHOS 49 81 97* 114*  PROT 6.8 6.5 6.5 6.6  ALBUMIN 3.6 3.2* 3.4* 3.4*    Assessment and Plan: Pt with hx of myelodysplastic  syndrome and now presents with progressive anemia and leukopenia.  He is scheduled today for CT-guided bone marrow biopsy for further evaluation.Risks and benefits discussed with the patient/spouse including, but not limited to bleeding, infection, damage to adjacent structures or low yield requiring additional tests.  All of the patient's questions were answered, patient is agreeable to proceed. Consent signed and in chart.     Electronically Signed: D. Rowe Robert, PA-C 02/25/2018, 8:28 AM   I spent a total of 20 minutes  at the the patient's bedside AND on the patient's hospital floor or unit, greater than 50% of which was counseling/coordinating care for CT guided bone marrow biopsy

## 2018-02-25 NOTE — Procedures (Signed)
Leukemia  S/p ILIAC BM ASP AND CORE  No comp Stable EBL 0 PATH PENDING FULL REPORT IN PACS

## 2018-02-25 NOTE — Discharge Instructions (Signed)
Moderate Conscious Sedation, Adult, Care After These instructions provide you with information about caring for yourself after your procedure. Your health care provider may also give you more specific instructions. Your treatment has been planned according to current medical practices, but problems sometimes occur. Call your health care provider if you have any problems or questions after your procedure. What can I expect after the procedure? After your procedure, it is common:  To feel sleepy for several hours.  To feel clumsy and have poor balance for several hours.  To have poor judgment for several hours.  To vomit if you eat too soon.  Follow these instructions at home: For at least 24 hours after the procedure:   Do not: ? Participate in activities where you could fall or become injured. ? Drive. ? Use heavy machinery. ? Drink alcohol. ? Take sleeping pills or medicines that cause drowsiness. ? Make important decisions or sign legal documents. ? Take care of children on your own.  Rest. Eating and drinking  Follow the diet recommended by your health care provider.  If you vomit: ? Drink water, juice, or soup when you can drink without vomiting. ? Make sure you have little or no nausea before eating solid foods. General instructions  Have a responsible adult stay with you until you are awake and alert.  Take over-the-counter and prescription medicines only as told by your health care provider.  If you smoke, do not smoke without supervision.  Keep all follow-up visits as told by your health care provider. This is important. Contact a health care provider if:  You keep feeling nauseous or you keep vomiting.  You feel light-headed.  You develop a rash.  You have a fever. Get help right away if:  You have trouble breathing. This information is not intended to replace advice given to you by your health care provider. Make sure you discuss any questions you have  with your health care provider. Document Released: 03/18/2013 Document Revised: 10/31/2015 Document Reviewed: 09/17/2015 Elsevier Interactive Patient Education  2018 [place]. Bone Marrow Aspiration and Bone Marrow Biopsy, Adult, Care After This sheet gives you information about how to care for yourself after your procedure. Your health care provider may also give you more specific instructions. If you have problems or questions, contact your health care provider. What can I expect after the procedure? After the procedure, it is common to have:  Mild pain and tenderness.  Swelling.  Bruising.  Follow these instructions at home:  Take over-the-counter or prescription medicines only as told by your health care provider.  Do not take baths, swim, or use a hot tub until your health care provider approves. Ask if you can take a shower or have a sponge bath.  Follow instructions from your health care provider about how to take care of the puncture site. Make sure you: ? Wash your hands with soap and water before you change your bandage (dressing). If soap and water are not available, use hand sanitizer. ? Change your dressing as told by your health care provider.  Check your puncture siteevery day for signs of infection. Check for: ? More redness, swelling, or pain. ? More fluid or blood. ? Warmth. ? Pus or a bad smell.  Return to your normal activities as told by your health care provider. Ask your health care provider what activities are safe for you.  Do not drive for 24 hours if you were given a medicine to help you relax (sedative).  Keep all follow-up visits as told by your health care provider. This is important. °Contact a health care provider if: °· You have more redness, swelling, or pain around the puncture site. °· You have more fluid or blood coming from the puncture site. °· Your puncture site feels warm to the touch. °· You have pus or a bad smell coming from the  puncture site. °· You have a fever. °· Your pain is not controlled with medicine. °This information is not intended to replace advice given to you by your health care provider. Make sure you discuss any questions you have with your health care provider. °Document Released: 12/15/2004 Document Revised: 12/16/2015 Document Reviewed: 11/09/2015 °Elsevier Interactive Patient Education © 2018 Elsevier Inc. ° °

## 2018-02-25 NOTE — Progress Notes (Signed)
CRITICAL VALUE ALERT  Critical Value:  WBC 1.2  Date & Time Notied:  0825 02/25/18  Provider Notified: Rowe Robert PA  Orders Received/Actions taken: no new orders

## 2018-02-26 ENCOUNTER — Encounter: Payer: Self-pay | Admitting: Hematology & Oncology

## 2018-03-03 ENCOUNTER — Inpatient Hospital Stay: Payer: Medicare Other

## 2018-03-03 ENCOUNTER — Inpatient Hospital Stay (HOSPITAL_BASED_OUTPATIENT_CLINIC_OR_DEPARTMENT_OTHER): Payer: Medicare Other | Admitting: Hematology & Oncology

## 2018-03-03 VITALS — BP 144/82 | HR 96 | Temp 97.8°F | Resp 18

## 2018-03-03 DIAGNOSIS — Z79899 Other long term (current) drug therapy: Secondary | ICD-10-CM

## 2018-03-03 DIAGNOSIS — D469 Myelodysplastic syndrome, unspecified: Secondary | ICD-10-CM

## 2018-03-03 DIAGNOSIS — D4620 Refractory anemia with excess of blasts, unspecified: Secondary | ICD-10-CM

## 2018-03-03 DIAGNOSIS — D72819 Decreased white blood cell count, unspecified: Secondary | ICD-10-CM

## 2018-03-03 DIAGNOSIS — D4621 Refractory anemia with excess of blasts 1: Secondary | ICD-10-CM | POA: Diagnosis not present

## 2018-03-03 DIAGNOSIS — D508 Other iron deficiency anemias: Secondary | ICD-10-CM

## 2018-03-03 DIAGNOSIS — D649 Anemia, unspecified: Secondary | ICD-10-CM

## 2018-03-03 DIAGNOSIS — D631 Anemia in chronic kidney disease: Secondary | ICD-10-CM

## 2018-03-03 LAB — CBC WITH DIFFERENTIAL (CANCER CENTER ONLY)
BASOS ABS: 0 10*3/uL (ref 0.0–0.1)
BASOS PCT: 0 %
EOS PCT: 1 %
Eosinophils Absolute: 0 10*3/uL (ref 0.0–0.5)
HEMATOCRIT: 34.6 % — AB (ref 38.7–49.9)
Hemoglobin: 10.9 g/dL — ABNORMAL LOW (ref 13.0–17.1)
LYMPHS ABS: 0.5 10*3/uL — AB (ref 0.9–3.3)
Lymphocytes Relative: 38 %
MCH: 33.1 pg (ref 28.0–33.4)
MCHC: 31.5 g/dL — AB (ref 32.0–35.9)
MCV: 105.2 fL — AB (ref 82.0–98.0)
Monocytes Absolute: 0.1 10*3/uL (ref 0.1–0.9)
Monocytes Relative: 9 %
NEUTROS PCT: 52 %
Neutro Abs: 0.7 10*3/uL — ABNORMAL LOW (ref 1.5–6.5)
PLATELETS: 146 10*3/uL (ref 145–400)
RBC: 3.29 MIL/uL — ABNORMAL LOW (ref 4.20–5.70)
RDW: 18.3 % — AB (ref 11.1–15.7)
WBC: 1.4 10*3/uL — AB (ref 4.0–10.0)

## 2018-03-03 LAB — CMP (CANCER CENTER ONLY)
ALBUMIN: 3.6 g/dL (ref 3.5–5.0)
ALK PHOS: 106 U/L — AB (ref 26–84)
ALT: 29 U/L (ref 10–47)
AST: 22 U/L (ref 11–38)
Anion gap: 3 — ABNORMAL LOW (ref 5–15)
BILIRUBIN TOTAL: 0.6 mg/dL (ref 0.2–1.6)
BUN: 24 mg/dL — ABNORMAL HIGH (ref 7–22)
CHLORIDE: 109 mmol/L — AB (ref 98–108)
CO2: 27 mmol/L (ref 18–33)
CREATININE: 1.4 mg/dL — AB (ref 0.60–1.20)
Calcium: 9 mg/dL (ref 8.0–10.3)
Glucose, Bld: 118 mg/dL (ref 73–118)
Potassium: 4.2 mmol/L (ref 3.3–4.7)
SODIUM: 139 mmol/L (ref 128–145)
Total Protein: 6.8 g/dL (ref 6.4–8.1)

## 2018-03-03 LAB — LACTATE DEHYDROGENASE: LDH: 262 U/L — ABNORMAL HIGH (ref 98–192)

## 2018-03-03 LAB — RETICULOCYTES
RBC.: 3.3 MIL/uL — AB (ref 4.20–5.82)
Retic Count, Absolute: 49.5 10*3/uL (ref 34.8–93.9)
Retic Ct Pct: 1.5 % (ref 0.8–1.8)

## 2018-03-03 MED ORDER — SODIUM CHLORIDE 0.9% FLUSH
10.0000 mL | INTRAVENOUS | Status: DC | PRN
  Administered 2018-03-03: 10 mL via INTRAVENOUS
  Filled 2018-03-03: qty 10

## 2018-03-03 MED ORDER — INFLUENZA VAC SPLIT QUAD 0.5 ML IM SUSY
PREFILLED_SYRINGE | INTRAMUSCULAR | Status: AC
  Filled 2018-03-03: qty 0.5

## 2018-03-03 MED ORDER — INFLUENZA VAC SPLIT QUAD 0.5 ML IM SUSY
0.5000 mL | PREFILLED_SYRINGE | Freq: Once | INTRAMUSCULAR | Status: AC
  Administered 2018-03-03: 0.5 mL via INTRAMUSCULAR

## 2018-03-03 MED ORDER — HEPARIN SOD (PORK) LOCK FLUSH 100 UNIT/ML IV SOLN
500.0000 [IU] | Freq: Once | INTRAVENOUS | Status: AC
  Administered 2018-03-03: 500 [IU] via INTRAVENOUS
  Filled 2018-03-03: qty 5

## 2018-03-03 NOTE — Patient Instructions (Signed)
Implanted Port Insertion, Care After °This sheet gives you information about how to care for yourself after your procedure. Your health care provider may also give you more specific instructions. If you have problems or questions, contact your health care provider. °What can I expect after the procedure? °After your procedure, it is common to have: °· Discomfort at the port insertion site. °· Bruising on the skin over the port. This should improve over 3-4 days. ° °Follow these instructions at home: °Port care °· After your port is placed, you will get a manufacturer's information card. The card has information about your port. Keep this card with you at all times. °· Take care of the port as told by your health care provider. Ask your health care provider if you or a family member can get training for taking care of the port at home. A home health care nurse may also take care of the port. °· Make sure to remember what type of port you have. °Incision care °· Follow instructions from your health care provider about how to take care of your port insertion site. Make sure you: °? Wash your hands with soap and water before you change your bandage (dressing). If soap and water are not available, use hand sanitizer. °? Change your dressing as told by your health care provider. °? Leave stitches (sutures), skin glue, or adhesive strips in place. These skin closures may need to stay in place for 2 weeks or longer. If adhesive strip edges start to loosen and curl up, you may trim the loose edges. Do not remove adhesive strips completely unless your health care provider tells you to do that. °· Check your port insertion site every day for signs of infection. Check for: °? More redness, swelling, or pain. °? More fluid or blood. °? Warmth. °? Pus or a bad smell. °General instructions °· Do not take baths, swim, or use a hot tub until your health care provider approves. °· Do not lift anything that is heavier than 10 lb (4.5  kg) for a week, or as told by your health care provider. °· Ask your health care provider when it is okay to: °? Return to work or school. °? Resume usual physical activities or sports. °· Do not drive for 24 hours if you were given a medicine to help you relax (sedative). °· Take over-the-counter and prescription medicines only as told by your health care provider. °· Wear a medical alert bracelet in case of an emergency. This will tell any health care providers that you have a port. °· Keep all follow-up visits as told by your health care provider. This is important. °Contact a health care provider if: °· You cannot flush your port with saline as directed, or you cannot draw blood from the port. °· You have a fever or chills. °· You have more redness, swelling, or pain around your port insertion site. °· You have more fluid or blood coming from your port insertion site. °· Your port insertion site feels warm to the touch. °· You have pus or a bad smell coming from the port insertion site. °Get help right away if: °· You have chest pain or shortness of breath. °· You have bleeding from your port that you cannot control. °Summary °· Take care of the port as told by your health care provider. °· Change your dressing as told by your health care provider. °· Keep all follow-up visits as told by your health care provider. °  This information is not intended to replace advice given to you by your health care provider. Make sure you discuss any questions you have with your health care provider. °Document Released: 03/18/2013 Document Revised: 04/18/2016 Document Reviewed: 04/18/2016 °Elsevier Interactive Patient Education © 2017 [employer] ° °

## 2018-03-03 NOTE — Progress Notes (Signed)
Hematology and Oncology Follow Up Visit  Anthony [name] [id number] [birth year] 77 y.o. 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  He is not feeling well at all.  He is still having a lot of problems with his back.  He fell.  I think he has a compression fracture in his lower back.  This is not been kyphoplasty.  He sees a pain doctor over in [place].  He has been getting some injections.  Because of his wife's ability to drive him around, they really would like to keep their treatments in [place].  I really think that a kyphoplasty would be reasonable for him.  He has a compression fracture at T12.  Unfortunately, as far as his myelodysplasia is concerned, this is progressing now.  We did have a bone marrow biopsy done on him on 02/25/2018.  The pathology report ([id number]) shows  RAEB-2 with evolving acute myeloid leukemia.  He has 20% blasts.  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  I am going to have to send his blood off for genetic panel testing.  I want to see if he has any IDH mutation that we might be able to use an oral drug for.  He has been on decitabine.  He actually is done incredibly well with this.  He was not transfused for quite a while until lately when he came in with a very low hemoglobin.  I thought that at that point, he had some change in his disease state.  If we cannot use targeted therapy, I will add Venetoclax to the decitabine.  He is not getting around all that much because of the back issues.  His appetite is  okay.  Is had no nausea or vomiting.  He has had no cough.  There is been no bleeding.  He has had no bruising.  He has had no fever.  Currently, at best, his performance status is ECOG 2.    Medications:  Allergies as of 03/03/2018   No Known Allergies     Medication List        Accurate as of 03/03/18  4:10 PM. Always use your most recent med list.          bimatoprost 0.01 % Soln Commonly known as:  LUMIGAN Place 1 drop into both eyes at bedtime.   budesonide-formoterol 160-4.5 MCG/ACT inhaler Commonly known as:  SYMBICORT Inhale 2 puffs into the lungs 2 (two) times daily.   buPROPion 150 MG 24 hr tablet Commonly known as:  WELLBUTRIN XL Take 150 mg by mouth daily.   CITRACAL PO Take 1,000 Units by mouth 2 (two) times daily.   clopidogrel 75 MG tablet Commonly known as:  PLAVIX Take 75 mg by mouth daily.   co-enzyme Q-10 30 MG capsule Take 100 mg by mouth daily.   Cyanocobalamin 2500 MCG Tabs Take 5,000 mcg by mouth daily.   fish oil-omega-3 fatty acids 1000 MG capsule Take 1 g by mouth daily.   glucosamine-chondroitin 500-400 MG tablet Take 1  tablet by mouth daily.   HYDROcodone-acetaminophen 7.5-325 MG tablet Commonly known as:  NORCO TAKE 1 TABLET BY MOUTH AS NEEDED EVERY 6 HOURS FOR 7 DAYS   LORazepam 0.5 MG tablet Commonly known as:  ATIVAN Take 1 tablet (0.5 mg total) by mouth every 6 (six) hours as needed (Nausea or vomiting).   magnesium oxide 400 MG tablet Commonly known as:  MAG-OX Take 400 mg by mouth daily.   Menthol (Topical Analgesic) 2.5 % Gel Apply topically.   NEURONTIN 300 MG capsule Generic drug:  gabapentin Take 300 mg by mouth daily.   pantoprazole 40 MG tablet Commonly known as:  PROTONIX Take 40 mg by mouth daily.   predniSONE 20 MG tablet Commonly known as:  DELTASONE Take 1 tablet (20 mg total) by mouth daily with breakfast.   PROAIR RESPICLICK 967 (90 Base) MCG/ACT Aepb Generic drug:  Albuterol Sulfate Inhale 2  puffs into the lungs every 6 (six) hours as needed.   albuterol (2.5 MG/3ML) 0.083% nebulizer solution Commonly known as:  PROVENTIL Take 3 mLs (2.5 mg total) by nebulization every 6 (six) hours as needed for wheezing or shortness of breath.   prochlorperazine 10 MG tablet Commonly known as:  COMPAZINE Take 1 tablet (10 mg total) by mouth every 6 (six) hours as needed (Nausea or vomiting).   ranitidine 300 MG tablet Commonly known as:  ZANTAC Take 300 mg by mouth at bedtime.   sertraline 100 MG tablet Commonly known as:  ZOLOFT Take 100 mg by mouth daily.   SIMBRINZA 1-0.2 % Susp Generic drug:  Brinzolamide-Brimonidine   simvastatin 40 MG tablet Commonly known as:  ZOCOR Take 40 mg by mouth every other day.   tamsulosin 0.4 MG Caps capsule Commonly known as:  FLOMAX   tiotropium 18 MCG inhalation capsule Commonly known as:  SPIRIVA Place 18 mcg into inhaler and inhale daily.   triamcinolone cream 0.1 % Commonly known as:  KENALOG APPLY TO RASH/ITCH ON ARMS TWICE DAILY AS NEEDED   Vitamin D3 5000 units Tabs Take by mouth daily.       Allergies: No Known Allergies  Past Medical History, Surgical history, Social history, and Family History were reviewed and updated.  Review of Systems: Review of Systems  Constitutional: Positive for malaise/fatigue.  HENT: Negative.   Eyes: Negative.   Respiratory: Negative.   Cardiovascular: Negative.   Gastrointestinal: Negative.   Genitourinary: Negative.   Musculoskeletal: Positive for back pain.  Skin: Negative.   Neurological: Negative.   Endo/Heme/Allergies: Negative.   Psychiatric/Behavioral: Negative.      Physical Exam:  oral temperature is 97.8 F (36.6 C). His blood pressure is 144/82 (abnormal) and his pulse is 96. His respiration is 18 and oxygen saturation is 100%.   Wt Readings from Last 3 Encounters:  02/25/18 194 lb (88 kg)  02/17/18 195 lb 12.8 oz (88.8 kg)  02/05/18 197 lb 6.4 oz (89.5 kg)     Physical Exam  Constitutional: He is oriented to person, place, and time.  HENT:  Head: Normocephalic and atraumatic.  Mouth/Throat: Oropharynx is clear and moist.  Eyes: Pupils are equal, round, and reactive to light. EOM are normal.  Neck: Normal range of motion.  Cardiovascular: Normal rate, regular rhythm and normal heart sounds.  Pulmonary/Chest: Effort normal and breath sounds normal.  Abdominal: Soft. Bowel sounds are normal.  Musculoskeletal: Normal range of motion. He exhibits no edema, tenderness or deformity.  Lymphadenopathy:    He has no cervical adenopathy.  Neurological:  He is alert and oriented to person, place, and time.  Skin: Skin is warm and dry. No rash noted. No erythema.  Psychiatric: He has a normal mood and affect. His behavior is normal. Judgment and thought content normal.  Vitals reviewed.    Lab Results  Component Value Date   WBC 1.4 (L) 03/03/2018   HGB 10.9 (L) 03/03/2018   HCT 34.6 (L) 03/03/2018   MCV 105.2 (H) 03/03/2018   PLT 146 03/03/2018   Lab Results  Component Value Date   FERRITIN 2,478 (H) 02/17/2018   IRON 98 02/17/2018   TIBC 250 02/17/2018   UIBC 152 02/17/2018   IRONPCTSAT 39 (L) 02/17/2018   Lab Results  Component Value Date   RETICCTPCT 1.5 03/03/2018   RBC 3.29 (L) 03/03/2018   RBC 3.30 (L) 03/03/2018   RETICCTABS 39.4 05/30/2015   Lab Results  Component Value Date   KAPLAMBRATIO 1.11 12/17/2016   Lab Results  Component Value Date   IGGSERUM 735 12/17/2016   IGMSERUM 79 12/17/2016   Lab Results  Component Value Date   TOTALPROTELP 7.3 01/01/2014   ALBUMINELP 55.1 (L) 01/01/2014   A1GS 6.9 (H) 01/01/2014   A2GS 8.9 01/01/2014   BETS 7.9 (H) 01/01/2014   BETA2SER 6.7 (H) 01/01/2014   GAMS 14.5 01/01/2014   MSPIKE Not Observed 12/17/2016   SPEI * 01/01/2014     Chemistry      Component Value Date/Time   NA 139 03/03/2018 1040   NA 141 05/06/2017 0901   NA 140 04/16/2016 0930   K 4.2 03/03/2018  1040   K 3.8 05/06/2017 0901   K 3.9 04/16/2016 0930   CL 109 (H) 03/03/2018 1040   CL 105 05/06/2017 0901   CO2 27 03/03/2018 1040   CO2 28 05/06/2017 0901   CO2 24 04/16/2016 0930   BUN 24 (H) 03/03/2018 1040   BUN 27 (H) 05/06/2017 0901   BUN 20.5 04/16/2016 0930   CREATININE 1.40 (H) 03/03/2018 1040   CREATININE 1.4 (H) 05/06/2017 0901   CREATININE 1.2 04/16/2016 0930      Component Value Date/Time   CALCIUM 9.0 03/03/2018 1040   CALCIUM 8.8 05/06/2017 0901   CALCIUM 8.9 04/16/2016 0930   ALKPHOS 106 (H) 03/03/2018 1040   ALKPHOS 46 05/06/2017 0901   ALKPHOS 54 04/16/2016 0930   AST 22 03/03/2018 1040   AST 16 04/16/2016 0930   ALT 29 03/03/2018 1040   ALT 25 05/06/2017 0901   ALT 21 04/16/2016 0930   BILITOT 0.6 03/03/2018 1040   BILITOT 0.27 04/16/2016 0930      Impression and Plan: Mr. Scalise is a very pleasant 77 yo caucasian gentleman with myelodysplasia and refractory anemia.   We are going to have to make a change in his protocol.  Again, I will see what the NGS panel shows [place].  It may take a week or so for this to come back.  Again if this is negative for any targetable mutation, then I would consider venetoclax with the decitabine.  I just feel bad because Mr. Vath is done so well with his myelodysplasia.  Currently, his biggest problem for quality of life is his back.  I do not know if he could have vertebroplasty.  I will have to speak to his pain doctor in [place].  I will plan to get him back to see me in a couple weeks or so after we have the NGS results back.  I spent about 45  minutes with he and his wife.  All the time spent face-to-face with them.  I counseled him and help coordinate further care and made phone calls and answered all their questions.    Volanda Napoleon, MD 9/23/20194:10 PM

## 2018-03-04 LAB — FERRITIN: Ferritin: 2558 ng/mL — ABNORMAL HIGH (ref 24–336)

## 2018-03-04 LAB — IRON AND TIBC
IRON: 126 ug/dL (ref 42–163)
Saturation Ratios: 50 % (ref 42–163)
TIBC: 254 ug/dL (ref 202–409)
UIBC: 128 ug/dL

## 2018-03-06 ENCOUNTER — Ambulatory Visit: Payer: Medicare Other | Admitting: Pulmonary Disease

## 2018-03-06 ENCOUNTER — Ambulatory Visit: Payer: Medicare Other

## 2018-03-11 ENCOUNTER — Encounter (HOSPITAL_COMMUNITY): Payer: Self-pay | Admitting: Hematology & Oncology

## 2018-03-13 LAB — AML PANEL-GENPATH

## 2018-03-17 ENCOUNTER — Telehealth: Payer: Self-pay | Admitting: Pharmacist

## 2018-03-17 ENCOUNTER — Other Ambulatory Visit: Payer: Self-pay | Admitting: Hematology & Oncology

## 2018-03-17 DIAGNOSIS — D4620 Refractory anemia with excess of blasts, unspecified: Secondary | ICD-10-CM

## 2018-03-17 MED ORDER — VENETOCLAX 100 MG PO TABS: 400.0000 mg | ORAL_TABLET | Freq: Every day | ORAL | 3 refills | Status: DC

## 2018-03-17 MED ORDER — VENETOCLAX 100 MG PO TABS: ORAL_TABLET | ORAL | 3 refills | Status: DC

## 2018-03-17 NOTE — Telephone Encounter (Signed)
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  CMP/CBC from 03/03/18 assessed, no relevant lab abnormalities, his SCr will continue to be monitored. Prescription dose and frequency assessed.   Current medication list in Epic reviewed, no DDIs with venetoclax identified.  Prescription has been e-scribed to the [facility] for benefits analysis and approval.  Oral Oncology Clinic will continue to follow for insurance authorization, copayment issues, initial counseling and start date.  Darl Pikes, PharmD, BCPS, [facility] Hematology/Oncology Clinical Pharmacist [facility] [place] [facility] [phone]  03/17/2018 4:18 PM

## 2018-03-19 ENCOUNTER — Telehealth: Payer: Self-pay | Admitting: Pharmacist

## 2018-03-19 ENCOUNTER — Ambulatory Visit: Payer: Medicare Other | Admitting: Pulmonary Disease

## 2018-03-19 ENCOUNTER — Ambulatory Visit: Payer: Medicare Other

## 2018-03-19 NOTE — Telephone Encounter (Signed)
Oral Oncology Pharmacist Encounter   Prior Authorization for Anthony [name] has been approved.     PA #: [id number] Effective dates: 03/19/18 through 06/11/19  Copay $1250.18, will contact patient for copay assistance   Oral Oncology Clinic will continue to follow.   Darl Pikes, PharmD, BCPS. [facility] Hematology/Oncology Clinical Pharmacist [facility] Oral [facility] [phone]  03/19/2018 9:45 AM

## 2018-03-19 NOTE — Telephone Encounter (Signed)
Oral Oncology Pharmacist Encounter   Received notification from [facility] that prior authorization for Venclexta (venetoclax) is required.   PA submitted on CMM Key [id number]  Status is pending   Oral Oncology Clinic will continue to follow.   Darl Pikes, PharmD, BCPS, [facility] Hematology/Oncology Clinical Pharmacist [facility] Oral [place] [facility] [phone]  03/19/2018 9:34 AM

## 2018-03-19 NOTE — Telephone Encounter (Signed)
Oral Chemotherapy Pharmacist Encounter  Successfully enrolled patient for copayment assistance funds from [facility] from the AML fund.  Award amount: $10,000 Effective dates: 02/17/18 - 02/17/19 ID: [id number] BIN: [id number] Group: [id number] PCN: [id number]  Billing information will be shared with [facility]. I will place a copy of the award letter to be scanned into patient's chart.  Darl Pikes, PharmD, BCPS, [facility] Hematology/Oncology Clinical Pharmacist [facility] Oral [place] [facility] [phone]  03/19/2018 11:56 AM

## 2018-03-19 NOTE — Telephone Encounter (Signed)
Oral Chemotherapy Pharmacist Encounter   [employer] copay assistance pending physician diagnosis fund.   Darl Pikes, PharmD, BCPS, [facility] Hematology/Oncology Clinical Pharmacist [facility] Oral [place] [facility] [phone]  03/19/2018 3:48 PM

## 2018-03-20 ENCOUNTER — Other Ambulatory Visit: Payer: Self-pay | Admitting: Hematology & Oncology

## 2018-03-20 DIAGNOSIS — D4620 Refractory anemia with excess of blasts, unspecified: Secondary | ICD-10-CM

## 2018-03-20 NOTE — Telephone Encounter (Signed)
Oral Oncology Patient Advocate Encounter  Faxed signed Physician diagnosis form to [facility].    Fax number: [phone]  We will continue to check the status of application and update this encounter.   [place] Patient [place] Phone [phone] Fax [phone] 03/20/2018 9:19 AM

## 2018-03-21 ENCOUNTER — Telehealth: Payer: Self-pay | Admitting: Pharmacy Technician

## 2018-03-21 ENCOUNTER — Other Ambulatory Visit: Payer: Self-pay | Admitting: *Deleted

## 2018-03-21 DIAGNOSIS — D4620 Refractory anemia with excess of blasts, unspecified: Secondary | ICD-10-CM

## 2018-03-21 MED ORDER — ALLOPURINOL 100 MG PO TABS: 100.0000 mg | ORAL_TABLET | Freq: Every day | ORAL | 1 refills | Status: DC

## 2018-03-21 MED FILL — ALLOPURINOL 100 MG TABLET: 100 | 30 days supply | Qty: 30 | Fill #0

## 2018-03-21 NOTE — Telephone Encounter (Signed)
Oral Oncology Patient Advocate Encounter  Spoke with the patient's wife, Abner Greenspan, about first fill of Venclexta.  Nuala Alpha will pick up his medication from [facility] and will bring it to his appointment on Tuesday 10/15.   [place] Patient [place] Phone [phone] Fax [phone] 03/21/2018 2:13 PM

## 2018-03-21 NOTE — Addendum Note (Signed)
Addended by: Darl Pikes on: 03/21/2018 02:53 PM   Modules accepted: Orders

## 2018-03-24 ENCOUNTER — Inpatient Hospital Stay: Payer: Medicare Other

## 2018-03-24 ENCOUNTER — Other Ambulatory Visit: Payer: Self-pay

## 2018-03-24 ENCOUNTER — Other Ambulatory Visit: Payer: Medicare Other

## 2018-03-24 ENCOUNTER — Encounter: Payer: Self-pay | Admitting: Family

## 2018-03-24 ENCOUNTER — Inpatient Hospital Stay: Payer: Medicare Other | Attending: Family | Admitting: Family

## 2018-03-24 VITALS — BP 126/72 | HR 92 | Temp 97.6°F | Resp 18 | Wt 191.2 lb

## 2018-03-24 DIAGNOSIS — D4621 Refractory anemia with excess of blasts 1: Secondary | ICD-10-CM | POA: Insufficient documentation

## 2018-03-24 DIAGNOSIS — Z79899 Other long term (current) drug therapy: Secondary | ICD-10-CM | POA: Diagnosis not present

## 2018-03-24 DIAGNOSIS — D4620 Refractory anemia with excess of blasts, unspecified: Secondary | ICD-10-CM

## 2018-03-24 DIAGNOSIS — M8000XG Age-related osteoporosis with current pathological fracture, unspecified site, subsequent encounter for fracture with delayed healing: Secondary | ICD-10-CM

## 2018-03-24 DIAGNOSIS — D630 Anemia in neoplastic disease: Secondary | ICD-10-CM

## 2018-03-24 DIAGNOSIS — E790 Hyperuricemia without signs of inflammatory arthritis and tophaceous disease: Secondary | ICD-10-CM

## 2018-03-24 DIAGNOSIS — D469 Myelodysplastic syndrome, unspecified: Secondary | ICD-10-CM

## 2018-03-24 DIAGNOSIS — D508 Other iron deficiency anemias: Secondary | ICD-10-CM

## 2018-03-24 DIAGNOSIS — D46Z Other myelodysplastic syndromes: Secondary | ICD-10-CM

## 2018-03-24 LAB — CBC WITH DIFFERENTIAL (CANCER CENTER ONLY)
Abs Immature Granulocytes: 0.11 10*3/uL — ABNORMAL HIGH (ref 0.00–0.07)
BASOS ABS: 0 10*3/uL (ref 0.0–0.1)
BASOS PCT: 0 %
EOS PCT: 1 %
Eosinophils Absolute: 0 10*3/uL (ref 0.0–0.5)
HCT: 32.4 % — ABNORMAL LOW (ref 39.0–52.0)
Hemoglobin: 10.2 g/dL — ABNORMAL LOW (ref 13.0–17.0)
Immature Granulocytes: 5 %
LYMPHS ABS: 0.6 10*3/uL — AB (ref 0.7–4.0)
Lymphocytes Relative: 27 %
MCH: 33.4 pg (ref 26.0–34.0)
MCHC: 31.5 g/dL (ref 30.0–36.0)
MCV: 106.2 fL — AB (ref 80.0–100.0)
Monocytes Absolute: 0.1 10*3/uL (ref 0.1–1.0)
Monocytes Relative: 7 %
NEUTROS PCT: 60 %
NRBC: 0 % (ref 0.0–0.2)
Neutro Abs: 1.3 10*3/uL — ABNORMAL LOW (ref 1.7–7.7)
PLATELETS: 141 10*3/uL — AB (ref 150–400)
RBC: 3.05 MIL/uL — AB (ref 4.22–5.81)
RDW: 19.5 % — AB (ref 11.5–15.5)
WBC: 2.1 10*3/uL — AB (ref 4.0–10.5)

## 2018-03-24 LAB — LACTATE DEHYDROGENASE: LDH: 220 U/L — ABNORMAL HIGH (ref 98–192)

## 2018-03-24 LAB — PHOSPHORUS: PHOSPHORUS: 2.2 mg/dL — AB (ref 2.5–4.6)

## 2018-03-24 LAB — CMP (CANCER CENTER ONLY)
ALK PHOS: 96 U/L — AB (ref 26–84)
ALT: 22 U/L (ref 10–47)
ANION GAP: 3 — AB (ref 5–15)
AST: 25 U/L (ref 11–38)
Albumin: 3.6 g/dL (ref 3.5–5.0)
BUN: 17 mg/dL (ref 7–22)
CHLORIDE: 110 mmol/L — AB (ref 98–108)
CO2: 26 mmol/L (ref 18–33)
Calcium: 9.1 mg/dL (ref 8.0–10.3)
Creatinine: 1.4 mg/dL — ABNORMAL HIGH (ref 0.60–1.20)
Glucose, Bld: 99 mg/dL (ref 73–118)
POTASSIUM: 3.9 mmol/L (ref 3.3–4.7)
SODIUM: 139 mmol/L (ref 128–145)
Total Bilirubin: 0.6 mg/dL (ref 0.2–1.6)
Total Protein: 6.3 g/dL — ABNORMAL LOW (ref 6.4–8.1)

## 2018-03-24 LAB — RETICULOCYTES
Immature Retic Fract: 19.9 % — ABNORMAL HIGH (ref 2.3–15.9)
RBC.: 3.05 MIL/uL — AB (ref 4.22–5.81)
RETIC CT PCT: 1.5 % (ref 0.4–3.1)
Retic Count, Absolute: 45.4 10*3/uL (ref 19.0–186.0)

## 2018-03-24 LAB — SAVE SMEAR (SSMR)

## 2018-03-24 LAB — URIC ACID: URIC ACID, SERUM: 4.2 mg/dL (ref 3.7–8.6)

## 2018-03-24 MED ORDER — HEPARIN SOD (PORK) LOCK FLUSH 100 UNIT/ML IV SOLN
500.0000 [IU] | Freq: Once | INTRAVENOUS | Status: AC | PRN
  Administered 2018-03-24: 500 [IU]
  Filled 2018-03-24: qty 5

## 2018-03-24 MED ORDER — SODIUM CHLORIDE 0.9% FLUSH
10.0000 mL | INTRAVENOUS | Status: DC | PRN
  Administered 2018-03-24: 10 mL
  Filled 2018-03-24: qty 10

## 2018-03-24 MED ORDER — SODIUM CHLORIDE 0.9 % IV SOLN
20.0000 mg/m2 | Freq: Once | INTRAVENOUS | Status: AC
  Administered 2018-03-24: 45 mg via INTRAVENOUS
  Filled 2018-03-24: qty 9

## 2018-03-24 MED ORDER — PROCHLORPERAZINE MALEATE 10 MG PO TABS
ORAL_TABLET | ORAL | Status: AC
  Filled 2018-03-24: qty 1

## 2018-03-24 MED ORDER — PROCHLORPERAZINE MALEATE 10 MG PO TABS
10.0000 mg | ORAL_TABLET | Freq: Once | ORAL | Status: AC
  Administered 2018-03-24: 10 mg via ORAL

## 2018-03-24 MED ORDER — SODIUM CHLORIDE 0.9 % IV SOLN
Freq: Once | INTRAVENOUS | Status: AC
  Administered 2018-03-24: 09:00:00 via INTRAVENOUS
  Filled 2018-03-24: qty 250

## 2018-03-24 MED FILL — PROCHLORPERAZINE 10 MG TAB: 10 | 15 days supply | Qty: 60 | Fill #1

## 2018-03-24 MED FILL — VENCLEXTA 100 MG TABS: 100 | 30 days supply | Qty: 120 | Fill #0

## 2018-03-24 NOTE — Progress Notes (Signed)
Okay to treat with ANC = 1.3 per Scherrie Bateman, NP

## 2018-03-24 NOTE — Progress Notes (Signed)
Hematology and Oncology Follow Up Visit  Anthony [name] [id number] October 04, 1940 77 y.o. 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 He is feeling fatigued and has not been sleeping well at night. He is napping during the day.  He is hungry in the mornings and eats a large breakfast but then his appetite comes and goes throughout the rest of the day.   He has some SOB with exertion and will take a break to rest when needed.  He uses his nebulizer when needed.  No fever, chills, n/v, cough, rash, dizziness, chest pain, palpitations, abdominal pain or changes in bladder habits.  His bowel habits have changed slightly and he is not quite as regular. No diarrhea or constipation.  No episodes of bleeding. His skin is thin and with Plavix he does bruise easily.  His lower back and tail bone pain waxes and wanes. He has not been able to get back in with Dr. Francesco Runner yet for evaluation for kyphoplasty but hopes to soon.  No swelling in his extremities. The numbness and tingling in his toes is unchanged.  He is using a walker when ambulating for support. He has no new falls or syncopal episodes to report.  No lymphadenopathy noted on exam.   ECOG Performance Status: 2 - Symptomatic, <50% confined to bed  Medications:  Allergies as of 03/24/2018   No Known Allergies     Medication List        Accurate as of 03/24/18  8:46 AM. Always use your most recent med list.          allopurinol 100 MG tablet Commonly known as:  ZYLOPRIM Take 1 tablet (100 mg total) by  mouth daily.   bimatoprost 0.01 % Soln Commonly known as:  LUMIGAN Place 1 drop into both eyes at bedtime.   budesonide-formoterol 160-4.5 MCG/ACT inhaler Commonly known as:  SYMBICORT Inhale 2 puffs into the lungs 2 (two) times daily.   buPROPion 150 MG 24 hr tablet Commonly known as:  WELLBUTRIN XL Take 150 mg by mouth daily.   CITRACAL PO Take 1,000 Units by mouth 2 (two) times daily.   clopidogrel 75 MG tablet Commonly known as:  PLAVIX Take 75 mg by mouth daily.   co-enzyme Q-10 30 MG capsule Take 100 mg by mouth daily.   Cyanocobalamin 2500 MCG Tabs Take 5,000 mcg by mouth daily.   fish oil-omega-3 fatty acids 1000 MG capsule Take 1 g by mouth daily.   glucosamine-chondroitin 500-400 MG tablet Take 1 tablet by mouth daily.   HYDROcodone-acetaminophen 7.5-325 MG tablet Commonly known as:  NORCO TAKE 1 TABLET BY MOUTH AS NEEDED EVERY 6 HOURS FOR 7 DAYS   LORazepam 0.5 MG tablet Commonly known as:  ATIVAN Take 1 tablet (0.5 mg total) by mouth every 6 (six) hours as needed (Nausea or vomiting).   magnesium oxide 400 MG tablet Commonly known as:  MAG-OX Take 400 mg by mouth daily.   Menthol (Topical Analgesic) 2.5 % Gel Apply topically.   NEURONTIN  300 MG capsule Generic drug:  gabapentin Take 300 mg by mouth daily.   pantoprazole 40 MG tablet Commonly known as:  PROTONIX Take 40 mg by mouth daily.   predniSONE 20 MG tablet Commonly known as:  DELTASONE Take 1 tablet (20 mg total) by mouth daily with breakfast.   PROAIR RESPICLICK 967 (90 Base) MCG/ACT Aepb Generic drug:  Albuterol Sulfate Inhale 2 puffs into the lungs every 6 (six) hours as needed.   albuterol (2.5 MG/3ML) 0.083% nebulizer solution Commonly known as:  PROVENTIL Take 3 mLs (2.5 mg total) by nebulization every 6 (six) hours as needed for wheezing or shortness of breath.   prochlorperazine 10 MG tablet Commonly known as:  COMPAZINE Take 1 tablet (10 mg total) by mouth every 6 (six)  hours as needed (Nausea or vomiting).   ranitidine 300 MG tablet Commonly known as:  ZANTAC Take 300 mg by mouth at bedtime.   sertraline 100 MG tablet Commonly known as:  ZOLOFT Take 100 mg by mouth daily.   SIMBRINZA 1-0.2 % Susp Generic drug:  Brinzolamide-Brimonidine   simvastatin 40 MG tablet Commonly known as:  ZOCOR Take 40 mg by mouth every other day.   tamsulosin 0.4 MG Caps capsule Commonly known as:  FLOMAX   tiotropium 18 MCG inhalation capsule Commonly known as:  SPIRIVA Place 18 mcg into inhaler and inhale daily.   triamcinolone cream 0.1 % Commonly known as:  KENALOG APPLY TO RASH/ITCH ON ARMS TWICE DAILY AS NEEDED   venetoclax 100 MG Tabs Take 400 mg by mouth daily. Take as directed.   Vitamin D3 5000 units Tabs Take by mouth daily.       Allergies: No Known Allergies  Past Medical History, Surgical history, Social history, and Family History were reviewed and updated.  Review of Systems: All other 10 point review of systems is negative.   Physical Exam:  weight is 191 lb 4 oz (86.8 kg). His oral temperature is 97.6 F (36.4 C). His blood pressure is 126/72 and his pulse is 92. His respiration is 18 and oxygen saturation is 99%.   Wt Readings from Last 3 Encounters:  03/24/18 191 lb 4 oz (86.8 kg)  02/25/18 194 lb (88 kg)  02/17/18 195 lb 12.8 oz (88.8 kg)    Ocular: Sclerae unicteric, pupils equal, round and reactive to light Ear-nose-throat: Oropharynx clear, dentition fair Lymphatic: No cervical, supraclavicular or axillary adenopathy Lungs no rales or rhonchi, good excursion bilaterally Heart regular rate and rhythm, no murmur appreciated Abd soft, nontender, positive bowel sounds, no liver or spleen tip palpated on exam, no fluid wave  MSK no focal spinal tenderness, no joint edema Neuro: non-focal, well-oriented, appropriate affect Breasts: Deferred   Lab Results  Component Value Date   WBC PENDING 03/24/2018   HGB PENDING  03/24/2018   HCT PENDING 03/24/2018   MCV PENDING 03/24/2018   PLT 141 (L) 03/24/2018   Lab Results  Component Value Date   FERRITIN 2,558 (H) 03/03/2018   IRON 126 03/03/2018   TIBC 254 03/03/2018   UIBC 128 03/03/2018   IRONPCTSAT 50 03/03/2018   Lab Results  Component Value Date   RETICCTPCT 1.5 03/03/2018   RBC PENDING 03/24/2018   RETICCTABS 39.4 05/30/2015   Lab Results  Component Value Date   KAPLAMBRATIO 1.11 12/17/2016   Lab Results  Component Value Date   IGGSERUM 735 12/17/2016   IGMSERUM 79 12/17/2016   Lab Results  Component Value Date   TOTALPROTELP 7.3 01/01/2014  ALBUMINELP 55.1 (L) 01/01/2014   A1GS 6.9 (H) 01/01/2014   A2GS 8.9 01/01/2014   BETS 7.9 (H) 01/01/2014   BETA2SER 6.7 (H) 01/01/2014   GAMS 14.5 01/01/2014   MSPIKE Not Observed 12/17/2016   SPEI * 01/01/2014     Chemistry      Component Value Date/Time   NA 139 03/24/2018 0805   NA 141 05/06/2017 0901   NA 140 04/16/2016 0930   K 3.9 03/24/2018 0805   K 3.8 05/06/2017 0901   K 3.9 04/16/2016 0930   CL 110 (H) 03/24/2018 0805   CL 105 05/06/2017 0901   CO2 26 03/24/2018 0805   CO2 28 05/06/2017 0901   CO2 24 04/16/2016 0930   BUN 17 03/24/2018 0805   BUN 27 (H) 05/06/2017 0901   BUN 20.5 04/16/2016 0930   CREATININE 1.40 (H) 03/24/2018 0805   CREATININE 1.4 (H) 05/06/2017 0901   CREATININE 1.2 04/16/2016 0930      Component Value Date/Time   CALCIUM 9.1 03/24/2018 0805   CALCIUM 8.8 05/06/2017 0901   CALCIUM 8.9 04/16/2016 0930   ALKPHOS 96 (H) 03/24/2018 0805   ALKPHOS 46 05/06/2017 0901   ALKPHOS 54 04/16/2016 0930   AST 25 03/24/2018 0805   AST 16 04/16/2016 0930   ALT 22 03/24/2018 0805   ALT 25 05/06/2017 0901   ALT 21 04/16/2016 0930   BILITOT 0.6 03/24/2018 0805   BILITOT 0.27 04/16/2016 0930      Impression and Plan: Anthony [name] is a very pleasant 77 yo caucasian gentleman with 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  His counts today are stable. We will proceed with Decitabine treatment as planned per Dr. Marin Olp.  Nuala Alpha, [facility], will be bringing his Venetoclax tomorrow so he can get started.  He will follow-up with Dr. Francesco Runner as soon as he is able for possible kyphoplasty.   We wills ee what his iron studies show and replace if needed.  We will plan to see him back in another 3 weeks for follow-up and labs weekly for now.  They will contact our office with any questions or concerns. We can certainly see him sooner if need be.   Laverna Peace, NP 10/14/20198:46 AM

## 2018-03-24 NOTE — Patient Instructions (Signed)
Implanted Port Home Guide An implanted port is a type of central line that is placed under the skin. Central lines are used to provide IV access when treatment or nutrition needs to be given through a person's veins. Implanted ports are used for long-term IV access. An implanted port may be placed because:  You need IV medicine that would be irritating to the small veins in your hands or arms.  You need long-term IV medicines, such as antibiotics.  You need IV nutrition for a long period.  You need frequent blood draws for lab tests.  You need dialysis.  Implanted ports are usually placed in the chest area, but they can also be placed in the upper arm, the abdomen, or the leg. An implanted port has two main parts:  Reservoir. The reservoir is round and will appear as a small, raised area under your skin. The reservoir is the part where a needle is inserted to give medicines or draw blood.  Catheter. The catheter is a thin, flexible tube that extends from the reservoir. The catheter is placed into a large vein. Medicine that is inserted into the reservoir goes into the catheter and then into the vein.  How will I care for my incision site? Do not get the incision site wet. Bathe or shower as directed by your health care provider. How is my port accessed? Special steps must be taken to access the port:  Before the port is accessed, a numbing cream can be placed on the skin. This helps numb the skin over the port site.  Your health care provider uses a sterile technique to access the port. ? Your health care provider must put on a mask and sterile gloves. ? The skin over your port is cleaned carefully with an antiseptic and allowed to dry. ? The port is gently pinched between sterile gloves, and a needle is inserted into the port.  Only "non-coring" port needles should be used to access the port. Once the port is accessed, a blood return should be checked. This helps ensure that the port  is in the vein and is not clogged.  If your port needs to remain accessed for a constant infusion, a clear (transparent) bandage will be placed over the needle site. The bandage and needle will need to be changed every week, or as directed by your health care provider.  Keep the bandage covering the needle clean and dry. Do not get it wet. Follow your health care provider's instructions on how to take a shower or bath while the port is accessed.  If your port does not need to stay accessed, no bandage is needed over the port.  What is flushing? Flushing helps keep the port from getting clogged. Follow your health care provider's instructions on how and when to flush the port. Ports are usually flushed with saline solution or a medicine called heparin. The need for flushing will depend on how the port is used.  If the port is used for intermittent medicines or blood draws, the port will need to be flushed: ? After medicines have been given. ? After blood has been drawn. ? As part of routine maintenance.  If a constant infusion is running, the port may not need to be flushed.  How long will my port stay implanted? The port can stay in for as long as your health care provider thinks it is needed. When it is time for the port to come out, surgery will be   done to remove it. The procedure is similar to the one performed when the port was put in. When should I seek immediate medical care? When you have an implanted port, you should seek immediate medical care if:  You notice a bad smell coming from the incision site.  You have swelling, redness, or drainage at the incision site.  You have more swelling or pain at the port site or the surrounding area.  You have a fever that is not controlled with medicine.  This information is not intended to replace advice given to you by your health care provider. Make sure you discuss any questions you have with your health care provider. Document  Released: 05/28/2005 Document Revised: 11/03/2015 Document Reviewed: 02/02/2013 Elsevier Interactive Patient Education  2017 [employer]  

## 2018-03-25 ENCOUNTER — Inpatient Hospital Stay: Payer: Medicare Other

## 2018-03-25 ENCOUNTER — Telehealth: Payer: Self-pay | Admitting: Pharmacist

## 2018-03-25 ENCOUNTER — Other Ambulatory Visit: Payer: Medicare Other

## 2018-03-25 ENCOUNTER — Other Ambulatory Visit: Payer: Self-pay | Admitting: *Deleted

## 2018-03-25 VITALS — BP 153/68 | HR 88 | Temp 93.0°F | Resp 17

## 2018-03-25 DIAGNOSIS — D4620 Refractory anemia with excess of blasts, unspecified: Secondary | ICD-10-CM

## 2018-03-25 DIAGNOSIS — D630 Anemia in neoplastic disease: Secondary | ICD-10-CM

## 2018-03-25 DIAGNOSIS — D46Z Other myelodysplastic syndromes: Secondary | ICD-10-CM

## 2018-03-25 DIAGNOSIS — D4621 Refractory anemia with excess of blasts 1: Secondary | ICD-10-CM | POA: Diagnosis not present

## 2018-03-25 DIAGNOSIS — F064 Anxiety disorder due to known physiological condition: Secondary | ICD-10-CM

## 2018-03-25 DIAGNOSIS — R110 Nausea: Secondary | ICD-10-CM

## 2018-03-25 DIAGNOSIS — F419 Anxiety disorder, unspecified: Secondary | ICD-10-CM

## 2018-03-25 DIAGNOSIS — D508 Other iron deficiency anemias: Secondary | ICD-10-CM

## 2018-03-25 LAB — CMP (CANCER CENTER ONLY)
ALT: 20 U/L (ref 10–47)
ANION GAP: 2 — AB (ref 5–15)
AST: 23 U/L (ref 11–38)
Albumin: 3.6 g/dL (ref 3.5–5.0)
Alkaline Phosphatase: 91 U/L — ABNORMAL HIGH (ref 26–84)
BILIRUBIN TOTAL: 0.5 mg/dL (ref 0.2–1.6)
BUN: 17 mg/dL (ref 7–22)
CHLORIDE: 111 mmol/L — AB (ref 98–108)
CO2: 25 mmol/L (ref 18–33)
Calcium: 8.6 mg/dL (ref 8.0–10.3)
Creatinine: 1.4 mg/dL — ABNORMAL HIGH (ref 0.60–1.20)
Glucose, Bld: 139 mg/dL — ABNORMAL HIGH (ref 73–118)
Potassium: 4.1 mmol/L (ref 3.3–4.7)
Sodium: 138 mmol/L (ref 128–145)
Total Protein: 6.3 g/dL — ABNORMAL LOW (ref 6.4–8.1)

## 2018-03-25 LAB — IRON AND TIBC
IRON: 182 ug/dL — AB (ref 42–163)
Saturation Ratios: 77 % (ref 42–163)
TIBC: 236 ug/dL (ref 202–409)
UIBC: 54 ug/dL

## 2018-03-25 LAB — FERRITIN: FERRITIN: 2710 ng/mL — AB (ref 24–336)

## 2018-03-25 LAB — PHOSPHORUS: Phosphorus: 2.7 mg/dL (ref 2.5–4.6)

## 2018-03-25 LAB — URIC ACID: URIC ACID, SERUM: 3.8 mg/dL (ref 3.7–8.6)

## 2018-03-25 MED ORDER — HEPARIN SOD (PORK) LOCK FLUSH 100 UNIT/ML IV SOLN
500.0000 [IU] | Freq: Once | INTRAVENOUS | Status: AC | PRN
  Administered 2018-03-25: 500 [IU]
  Filled 2018-03-25: qty 5

## 2018-03-25 MED ORDER — PREDNISONE 20 MG PO TABS: 20.0000 mg | ORAL_TABLET | Freq: Every day | ORAL | 6 refills | Status: DC

## 2018-03-25 MED ORDER — SODIUM CHLORIDE 0.9 % IV SOLN
Freq: Once | INTRAVENOUS | Status: AC
  Administered 2018-03-25: 09:00:00 via INTRAVENOUS
  Filled 2018-03-25: qty 250

## 2018-03-25 MED ORDER — SODIUM CHLORIDE 0.9 % IV SOLN
20.0000 mg/m2 | Freq: Once | INTRAVENOUS | Status: AC
  Administered 2018-03-25: 45 mg via INTRAVENOUS
  Filled 2018-03-25: qty 9

## 2018-03-25 MED ORDER — LORAZEPAM 0.5 MG PO TABS: 0.5000 mg | ORAL_TABLET | Freq: Four times a day (QID) | ORAL | 0 refills | Status: DC | PRN

## 2018-03-25 MED ORDER — SODIUM CHLORIDE 0.9% FLUSH
10.0000 mL | INTRAVENOUS | Status: DC | PRN
  Administered 2018-03-25: 10 mL
  Filled 2018-03-25: qty 10

## 2018-03-25 MED FILL — predniSONE 20 MG TABS: 20 | 30 days supply | Qty: 30 | Fill #0

## 2018-03-25 MED FILL — LORazepam 0.5 MG TABS: 0.5 | 23 days supply | Qty: 90 | Fill #0

## 2018-03-25 NOTE — Patient Instructions (Signed)
Implanted Port Insertion, Care After °This sheet gives you information about how to care for yourself after your procedure. Your health care provider may also give you more specific instructions. If you have problems or questions, contact your health care provider. °What can I expect after the procedure? °After your procedure, it is common to have: °· Discomfort at the port insertion site. °· Bruising on the skin over the port. This should improve over 3-4 days. ° °Follow these instructions at home: °Port care °· After your port is placed, you will get a manufacturer's information card. The card has information about your port. Keep this card with you at all times. °· Take care of the port as told by your health care provider. Ask your health care provider if you or a family member can get training for taking care of the port at home. A home health care nurse may also take care of the port. °· Make sure to remember what type of port you have. °Incision care °· Follow instructions from your health care provider about how to take care of your port insertion site. Make sure you: °? Wash your hands with soap and water before you change your bandage (dressing). If soap and water are not available, use hand sanitizer. °? Change your dressing as told by your health care provider. °? Leave stitches (sutures), skin glue, or adhesive strips in place. These skin closures may need to stay in place for 2 weeks or longer. If adhesive strip edges start to loosen and curl up, you may trim the loose edges. Do not remove adhesive strips completely unless your health care provider tells you to do that. °· Check your port insertion site every day for signs of infection. Check for: °? More redness, swelling, or pain. °? More fluid or blood. °? Warmth. °? Pus or a bad smell. °General instructions °· Do not take baths, swim, or use a hot tub until your health care provider approves. °· Do not lift anything that is heavier than 10 lb (4.5  kg) for a week, or as told by your health care provider. °· Ask your health care provider when it is okay to: °? Return to work or school. °? Resume usual physical activities or sports. °· Do not drive for 24 hours if you were given a medicine to help you relax (sedative). °· Take over-the-counter and prescription medicines only as told by your health care provider. °· Wear a medical alert bracelet in case of an emergency. This will tell any health care providers that you have a port. °· Keep all follow-up visits as told by your health care provider. This is important. °Contact a health care provider if: °· You cannot flush your port with saline as directed, or you cannot draw blood from the port. °· You have a fever or chills. °· You have more redness, swelling, or pain around your port insertion site. °· You have more fluid or blood coming from your port insertion site. °· Your port insertion site feels warm to the touch. °· You have pus or a bad smell coming from the port insertion site. °Get help right away if: °· You have chest pain or shortness of breath. °· You have bleeding from your port that you cannot control. °Summary °· Take care of the port as told by your health care provider. °· Change your dressing as told by your health care provider. °· Keep all follow-up visits as told by your health care provider. °  This information is not intended to replace advice given to you by your health care provider. Make sure you discuss any questions you have with your health care provider. °Document Released: 03/18/2013 Document Revised: 04/18/2016 Document Reviewed: 04/18/2016 °Elsevier Interactive Patient Education © 2017 [employer] ° °

## 2018-03-25 NOTE — Telephone Encounter (Signed)
Oral Chemotherapy Pharmacist Encounter   Reviewed baseline labs from 03/24/18. After re-education today in clinic. Provided patient with first bottle of venetoclax, dispensed from [place].   Patient took his first dose of venetoclax, ventetoclax 100mg  (1 tablet). Provider patient and his wife with ramp-up dosing calendar. Reviewed calendar and asked all questions.   I reviewed the post dose labs from 03/25/18 and called to check in with the patient in the afternoon. His wife Abner Greenspan stated he was going well.  Reviewed with them the plan to increase to 200mg  (2 tablets) tomorrow. [name] stated her understanding.   Darl Pikes, PharmD, BCPS, [facility] Hematology/Oncology Clinical Pharmacist [facility] [phone]  03/25/2018 5:03 PM

## 2018-03-25 NOTE — Telephone Encounter (Signed)
Oral Chemotherapy Pharmacist Encounter  Successfully enrolled patient for copayment assistance funds from [employer] from the AML fund.  Award amount: $[id number] Effective dates: 12/11/17 - 04-10-19 ID: [id number] BIN: [id number] Group: [id number] PCN: [id number]  Billing information will be shared with [facility]. I will place a copy of the award letter to be scanned into patient's chart.  Darl Pikes, PharmD, BCPS Hematology/Oncology Clinical Pharmacist [facility] [phone]  03/25/2018 4:29 PM

## 2018-03-25 NOTE — Patient Instructions (Addendum)
Decitabine injection for infusion What is this medicine? DECITABINE (dee SYE ta been) is a chemotherapy drug. This medicine reduces the growth of cancer cells. It is used to treat adults with myelodysplastic syndromes. This medicine may be used for other purposes; ask your health care provider or pharmacist if you have questions. COMMON BRAND NAME(S): Dacogen What should I tell my health care provider before I take this medicine? They need to know if you have any of these conditions: -infection (especially a virus infection such as chickenpox, cold sores, or herpes) -kidney disease -liver disease -an unusual or allergic reaction to decitabine, other medicines, foods, dyes, or preservatives -pregnant or trying to get pregnant -breast-feeding How should I use this medicine? This medicine is for infusion into a vein. It is administered in a hospital or clinic by a doctor or health care professional. Talk to your pediatrician regarding the use of this medicine in children. Special care may be needed. Overdosage: If you think you have taken too much of this medicine contact a poison control center or emergency room at once. NOTE: This medicine is only for you. Do not share this medicine with others. What if I miss a dose? It is important not to miss your dose. Call your doctor or health care professional if you are unable to keep an appointment. What may interact with this medicine? -vaccines Talk to your doctor or health care professional before taking any of these medicines: -aspirin -acetaminophen -ibuprofen -ketoprofen -naproxen This list may not describe all possible interactions. Give your health care provider a list of all the medicines, herbs, non-prescription drugs, or dietary supplements you use. Also tell them if you smoke, drink alcohol, or use illegal drugs. Some items may interact with your medicine. What should I watch for while using this medicine? Visit your doctor for  checks on your progress. This drug may make you feel generally unwell. This is not uncommon, as chemotherapy can affect healthy cells as well as cancer cells. Report any side effects. Continue your course of treatment even though you feel ill unless your doctor tells you to stop. In some cases, you may be given additional medicines to help with side effects. Follow all directions for their use. Call your doctor or health care professional for advice if you get a fever, chills or sore throat, or other symptoms of a cold or flu. Do not treat yourself. This drug decreases your body's ability to fight infections. Try to avoid being around people who are sick. This medicine may increase your risk to bruise or bleed. Call your doctor or health care professional if you notice any unusual bleeding. Do not become pregnant while taking this medicine or for at least 1 month after stopping it. Women should inform their doctor if they wish to become pregnant or think they might be pregnant. Men should not father a child while taking this medicine and for at least 2 months after stopping it. There is a potential for serious side effects to an unborn child. Talk to your health care professional or pharmacist for more information. Do not breast-feed an infant while taking this medicine. What side effects may I notice from receiving this medicine? Side effects that you should report to your doctor or health care professional as soon as possible: -low blood counts - this medicine may decrease the number of white blood cells, red blood cells and platelets. You may be at increased risk for infections and bleeding. -signs of infection - fever or   chills, cough, sore throat, pain or difficulty passing urine -signs of decreased platelets or bleeding - bruising, pinpoint red spots on the skin, black, tarry stools, blood in the urine -signs of decreased red blood cells - unusual weakness or tiredness, fainting spells,  lightheadedness -increased blood sugar Side effects that usually do not require medical attention (report to your doctor or health care professional if they continue or are bothersome): -constipation -diarrhea -headache -loss of appetite -nausea, vomiting -skin rash, itching -stomach pain -water retention -weak or tired This list may not describe all possible side effects. Call your doctor for medical advice about side effects. You may report side effects to FDA at 1-800-FDA-1088. Where should I keep my medicine? This drug is given in a hospital or clinic and will not be stored at home. NOTE: This sheet is a summary. It may not cover all possible information. If you have questions about this medicine, talk to your doctor, pharmacist, or health care provider.  2018 Elsevier/Gold Standard (2015-06-30 15:52:57)  Dehydration, Adult Dehydration is when there is not enough fluid or water in your body. This happens when you lose more fluids than you take in. Dehydration can range from mild to very bad. It should be treated right away to keep it from getting very bad. Symptoms of mild dehydration may include:  Thirst.  Dry lips.  Slightly dry mouth.  Dry, warm skin.  Dizziness. Symptoms of moderate dehydration may include:  Very dry mouth.  Muscle cramps.  Dark pee (urine). Pee may be the color of tea.  Your body making less pee.  Your eyes making fewer tears.  Heartbeat that is uneven or faster than normal (palpitations).  Headache.  Light-headedness, especially when you stand up from sitting.  Fainting (syncope). Symptoms of very bad dehydration may include:  Changes in skin, such as: ? Cold and clammy skin. ? Blotchy (mottled) or pale skin. ? Skin that does not quickly return to normal after being lightly pinched and let go (poor skin turgor).  Changes in body fluids, such as: ? Feeling very thirsty. ? Your eyes making fewer tears. ? Not sweating when body  temperature is high, such as in hot weather. ? Your body making very little pee.  Changes in vital signs, such as: ? Weak pulse. ? Pulse that is more than 100 beats a minute when you are sitting still. ? Fast breathing. ? Low blood pressure.  Other changes, such as: ? Sunken eyes. ? Cold hands and feet. ? Confusion. ? Lack of energy (lethargy). ? Trouble waking up from sleep. ? Short-term weight loss. ? Unconsciousness. Follow these instructions at home:  If told by your doctor, drink an ORS: ? Make an ORS by using instructions on the package. ? Start by drinking small amounts, about  cup (120 mL) every 5-10 minutes. ? Slowly drink more until you have had the amount that your doctor said to have.  Drink enough clear fluid to keep your pee clear or pale yellow. If you were told to drink an ORS, finish the ORS first, then start slowly drinking clear fluids. Drink fluids such as: ? Water. Do not drink only water by itself. Doing that can make the salt (sodium) level in your body get too low (hyponatremia). ? Ice chips. ? Fruit juice that you have added water to (diluted). ? Low-calorie sports drinks.  Avoid: ? Alcohol. ? Drinks that have a lot of sugar. These include high-calorie sports drinks, fruit juice that does not have  water added, and soda. ? Caffeine. ? Foods that are greasy or have a lot of fat or sugar.  Take over-the-counter and prescription medicines only as told by your doctor.  Do not take salt tablets. Doing that can make the salt level in your body get too high (hypernatremia).  Eat foods that have minerals (electrolytes). Examples include bananas, oranges, potatoes, tomatoes, and spinach.  Keep all follow-up visits as told by your doctor. This is important. Contact a doctor if:  You have belly (abdominal) pain that: ? Gets worse. ? Stays in one area (localizes).  You have a rash.  You have a stiff neck.  You get angry or annoyed more easily than  normal (irritability).  You are more sleepy than normal.  You have a harder time waking up than normal.  You feel: ? Weak. ? Dizzy. ? Very thirsty.  You have peed (urinated) only a small amount of very dark pee during 6-8 hours. Get help right away if:  You have symptoms of very bad dehydration.  You cannot drink fluids without throwing up (vomiting).  Your symptoms get worse with treatment.  You have a fever.  You have a very bad headache.  You are throwing up or having watery poop (diarrhea) and it: ? Gets worse. ? Does not go away.  You have blood or something green (bile) in your throw-up.  You have blood in your poop (stool). This may cause poop to look black and tarry.  You have not peed in 6-8 hours.  You pass out (faint).  Your heart rate when you are sitting still is more than 100 beats a minute.  You have trouble breathing. This information is not intended to replace advice given to you by your health care provider. Make sure you discuss any questions you have with your health care provider. Document Released: 03/24/2009 Document Revised: 12/16/2015 Document Reviewed: 07/22/2015 Elsevier Interactive Patient Education  2018 [employer].

## 2018-03-25 NOTE — Telephone Encounter (Signed)
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   Pt is doing well. Counseled patient and his wife on administration, dosing, side effects, monitoring, drug-food interactions, safe handling, storage, and disposal. Patient will take 100mg  (1 tablet) on day one, 200mg  (2 tablets) on day two, then 400mg  (4 tablets, goal dose) daily.  Side effects include but not limited to: N/V, diarrhea, decreased wbc, fatigue, TLS.    Reviewed with patient importance of keeping a medication schedule and plan for any missed doses.  Mr. Thomason and his wife voiced understanding and appreciation. All questions answered. Medication handout and calendar provided.  Provided patient with [facility] phone number. Patient knows to call the office with questions or concerns. Oral Chemotherapy [facility] will continue to follow.  Darl Pikes, PharmD, BCPS, [facility] Hematology/Oncology Clinical Pharmacist [facility] [phone]  03/25/2018 5:02 PM

## 2018-03-26 ENCOUNTER — Inpatient Hospital Stay: Payer: Medicare Other

## 2018-03-26 ENCOUNTER — Other Ambulatory Visit: Payer: Self-pay | Admitting: *Deleted

## 2018-03-26 DIAGNOSIS — D4621 Refractory anemia with excess of blasts 1: Secondary | ICD-10-CM | POA: Diagnosis not present

## 2018-03-26 DIAGNOSIS — D4620 Refractory anemia with excess of blasts, unspecified: Secondary | ICD-10-CM

## 2018-03-26 DIAGNOSIS — D508 Other iron deficiency anemias: Secondary | ICD-10-CM

## 2018-03-26 DIAGNOSIS — D46Z Other myelodysplastic syndromes: Secondary | ICD-10-CM

## 2018-03-26 DIAGNOSIS — D630 Anemia in neoplastic disease: Secondary | ICD-10-CM

## 2018-03-26 LAB — CMP (CANCER CENTER ONLY)
ALBUMIN: 3.2 g/dL — AB (ref 3.5–5.0)
ALT: 21 U/L (ref 10–47)
ALT: 24 U/L (ref 10–47)
ANION GAP: 0 — AB (ref 5–15)
AST: 22 U/L (ref 11–38)
AST: 23 U/L (ref 11–38)
Albumin: 3.4 g/dL — ABNORMAL LOW (ref 3.5–5.0)
Alkaline Phosphatase: 76 U/L (ref 26–84)
Alkaline Phosphatase: 81 U/L (ref 26–84)
Anion gap: 0 — ABNORMAL LOW (ref 5–15)
BILIRUBIN TOTAL: 0.6 mg/dL (ref 0.2–1.6)
BUN: 14 mg/dL (ref 7–22)
BUN: 13 mg/dL (ref 7–22)
CALCIUM: 8 mg/dL (ref 8.0–10.3)
CO2: 26 mmol/L (ref 18–33)
CO2: 25 mmol/L (ref 18–33)
CREATININE: 1.3 mg/dL — AB (ref 0.60–1.20)
Calcium: 8.2 mg/dL (ref 8.0–10.3)
Chloride: 111 mmol/L — ABNORMAL HIGH (ref 98–108)
Chloride: 111 mmol/L — ABNORMAL HIGH (ref 98–108)
Creatinine: 1.6 mg/dL — ABNORMAL HIGH (ref 0.60–1.20)
Glucose, Bld: 106 mg/dL (ref 73–118)
Glucose, Bld: 153 mg/dL — ABNORMAL HIGH (ref 73–118)
POTASSIUM: 4.4 mmol/L (ref 3.3–4.7)
Potassium: 4.4 mmol/L (ref 3.3–4.7)
SODIUM: 136 mmol/L (ref 128–145)
Sodium: 136 mmol/L (ref 128–145)
TOTAL PROTEIN: 6 g/dL — AB (ref 6.4–8.1)
TOTAL PROTEIN: 5.8 g/dL — AB (ref 6.4–8.1)
Total Bilirubin: 0.5 mg/dL (ref 0.2–1.6)

## 2018-03-26 LAB — CBC WITH DIFFERENTIAL (CANCER CENTER ONLY)
Abs Immature Granulocytes: 0.03 10*3/uL (ref 0.00–0.07)
Basophils Absolute: 0 10*3/uL (ref 0.0–0.1)
Basophils Relative: 0 %
EOS ABS: 0 10*3/uL (ref 0.0–0.5)
EOS PCT: 1 %
HEMATOCRIT: 29.4 % — AB (ref 39.0–52.0)
Hemoglobin: 9.2 g/dL — ABNORMAL LOW (ref 13.0–17.0)
IMMATURE GRANULOCYTES: 2 %
LYMPHS ABS: 0.4 10*3/uL — AB (ref 0.7–4.0)
Lymphocytes Relative: 27 %
MCH: 33.5 pg (ref 26.0–34.0)
MCHC: 31.3 g/dL (ref 30.0–36.0)
MCV: 106.9 fL — AB (ref 80.0–100.0)
MONO ABS: 0.1 10*3/uL (ref 0.1–1.0)
MONOS PCT: 8 %
Neutro Abs: 0.9 10*3/uL — ABNORMAL LOW (ref 1.7–7.7)
Neutrophils Relative %: 62 %
Platelet Count: 115 10*3/uL — ABNORMAL LOW (ref 150–400)
RBC: 2.75 MIL/uL — ABNORMAL LOW (ref 4.22–5.81)
RDW: 19.9 % — AB (ref 11.5–15.5)
WBC Count: 1.4 10*3/uL — ABNORMAL LOW (ref 4.0–10.5)
nRBC: 0 % (ref 0.0–0.2)

## 2018-03-26 LAB — PHOSPHORUS
PHOSPHORUS: 1.7 mg/dL — AB (ref 2.5–4.6)
Phosphorus: 1.8 mg/dL — ABNORMAL LOW (ref 2.5–4.6)

## 2018-03-26 LAB — URIC ACID
URIC ACID, SERUM: 4.3 mg/dL (ref 3.7–8.6)
URIC ACID, SERUM: 3.9 mg/dL (ref 3.7–8.6)

## 2018-03-26 MED ORDER — PROCHLORPERAZINE MALEATE 10 MG PO TABS: 10.0000 mg | ORAL_TABLET | Freq: Once | ORAL | Status: DC

## 2018-03-26 MED ORDER — SODIUM CHLORIDE 0.9% FLUSH
10.0000 mL | INTRAVENOUS | Status: DC | PRN
  Filled 2018-03-26: qty 10

## 2018-03-26 MED ORDER — HEPARIN SOD (PORK) LOCK FLUSH 100 UNIT/ML IV SOLN
500.0000 [IU] | Freq: Once | INTRAVENOUS | Status: DC | PRN
  Filled 2018-03-26: qty 5

## 2018-03-26 MED ORDER — SODIUM CHLORIDE 0.9 % IV SOLN
Freq: Once | INTRAVENOUS | Status: AC
  Administered 2018-03-26: 08:00:00 via INTRAVENOUS
  Filled 2018-03-26: qty 250

## 2018-03-26 MED ORDER — SODIUM CHLORIDE 0.9 % IV SOLN
20.0000 mg/m2 | Freq: Once | INTRAVENOUS | Status: AC
  Administered 2018-03-26: 45 mg via INTRAVENOUS
  Filled 2018-03-26: qty 9

## 2018-03-26 NOTE — Patient Instructions (Signed)
Implanted Port Home Guide An implanted port is a type of central line that is placed under the skin. Central lines are used to provide IV access when treatment or nutrition needs to be given through a person's veins. Implanted ports are used for long-term IV access. An implanted port may be placed because:  You need IV medicine that would be irritating to the small veins in your hands or arms.  You need long-term IV medicines, such as antibiotics.  You need IV nutrition for a long period.  You need frequent blood draws for lab tests.  You need dialysis.  Implanted ports are usually placed in the chest area, but they can also be placed in the upper arm, the abdomen, or the leg. An implanted port has two main parts:  Reservoir. The reservoir is round and will appear as a small, raised area under your skin. The reservoir is the part where a needle is inserted to give medicines or draw blood.  Catheter. The catheter is a thin, flexible tube that extends from the reservoir. The catheter is placed into a large vein. Medicine that is inserted into the reservoir goes into the catheter and then into the vein.  How will I care for my incision site? Do not get the incision site wet. Bathe or shower as directed by your health care provider. How is my port accessed? Special steps must be taken to access the port:  Before the port is accessed, a numbing cream can be placed on the skin. This helps numb the skin over the port site.  Your health care provider uses a sterile technique to access the port. ? Your health care provider must put on a mask and sterile gloves. ? The skin over your port is cleaned carefully with an antiseptic and allowed to dry. ? The port is gently pinched between sterile gloves, and a needle is inserted into the port.  Only "non-coring" port needles should be used to access the port. Once the port is accessed, a blood return should be checked. This helps ensure that the port  is in the vein and is not clogged.  If your port needs to remain accessed for a constant infusion, a clear (transparent) bandage will be placed over the needle site. The bandage and needle will need to be changed every week, or as directed by your health care provider.  Keep the bandage covering the needle clean and dry. Do not get it wet. Follow your health care provider's instructions on how to take a shower or bath while the port is accessed.  If your port does not need to stay accessed, no bandage is needed over the port.  What is flushing? Flushing helps keep the port from getting clogged. Follow your health care provider's instructions on how and when to flush the port. Ports are usually flushed with saline solution or a medicine called heparin. The need for flushing will depend on how the port is used.  If the port is used for intermittent medicines or blood draws, the port will need to be flushed: ? After medicines have been given. ? After blood has been drawn. ? As part of routine maintenance.  If a constant infusion is running, the port may not need to be flushed.  How long will my port stay implanted? The port can stay in for as long as your health care provider thinks it is needed. When it is time for the port to come out, surgery will be   done to remove it. The procedure is similar to the one performed when the port was put in. When should I seek immediate medical care? When you have an implanted port, you should seek immediate medical care if:  You notice a bad smell coming from the incision site.  You have swelling, redness, or drainage at the incision site.  You have more swelling or pain at the port site or the surrounding area.  You have a fever that is not controlled with medicine.  This information is not intended to replace advice given to you by your health care provider. Make sure you discuss any questions you have with your health care provider. Document  Released: 05/28/2005 Document Revised: 11/03/2015 Document Reviewed: 02/02/2013 Elsevier Interactive Patient Education  2017 [employer]  

## 2018-03-26 NOTE — Progress Notes (Signed)
Okay to treat with [place] = 0.9 per Laverna Peace, NP

## 2018-03-26 NOTE — Telephone Encounter (Signed)
Oral Chemotherapy Pharmacist Encounter  Reviewed pre-dose labs from 03/26/18. I called to check in on [place] confirmed that he took his second dose of venetoclax, ventetoclax 200mg  (2 tablet), while in clinic today.   Reviewed with them the plan to increase to 400mg  (4 tablets) tomorrow. [name] stated her understanding. Well know to call with any concerns.  Darl Pikes, PharmD, BCPS, [facility] Hematology/Oncology Clinical Pharmacist [facility] [phone]  03/26/2018 3:14 PM

## 2018-03-26 NOTE — Patient Instructions (Signed)
Decitabine injection for infusion What is this medicine? DECITABINE (dee SYE ta been) is a chemotherapy drug. This medicine reduces the growth of cancer cells. It is used to treat adults with myelodysplastic syndromes. This medicine may be used for other purposes; ask your health care provider or pharmacist if you have questions. COMMON BRAND NAME(S): Dacogen What should I tell my health care provider before I take this medicine? They need to know if you have any of these conditions: -infection (especially a virus infection such as chickenpox, cold sores, or herpes) -kidney disease -liver disease -an unusual or allergic reaction to decitabine, other medicines, foods, dyes, or preservatives -pregnant or trying to get pregnant -breast-feeding How should I use this medicine? This medicine is for infusion into a vein. It is administered in a hospital or clinic by a doctor or health care professional. Talk to your pediatrician regarding the use of this medicine in children. Special care may be needed. Overdosage: If you think you have taken too much of this medicine contact a poison control center or emergency room at once. NOTE: This medicine is only for you. Do not share this medicine with others. What if I miss a dose? It is important not to miss your dose. Call your doctor or health care professional if you are unable to keep an appointment. What may interact with this medicine? -vaccines Talk to your doctor or health care professional before taking any of these medicines: -aspirin -acetaminophen -ibuprofen -ketoprofen -naproxen This list may not describe all possible interactions. Give your health care provider a list of all the medicines, herbs, non-prescription drugs, or dietary supplements you use. Also tell them if you smoke, drink alcohol, or use illegal drugs. Some items may interact with your medicine. What should I watch for while using this medicine? Visit your doctor for  checks on your progress. This drug may make you feel generally unwell. This is not uncommon, as chemotherapy can affect healthy cells as well as cancer cells. Report any side effects. Continue your course of treatment even though you feel ill unless your doctor tells you to stop. In some cases, you may be given additional medicines to help with side effects. Follow all directions for their use. Call your doctor or health care professional for advice if you get a fever, chills or sore throat, or other symptoms of a cold or flu. Do not treat yourself. This drug decreases your body's ability to fight infections. Try to avoid being around people who are sick. This medicine may increase your risk to bruise or bleed. Call your doctor or health care professional if you notice any unusual bleeding. Do not become pregnant while taking this medicine or for at least 1 month after stopping it. Women should inform their doctor if they wish to become pregnant or think they might be pregnant. Men should not father a child while taking this medicine and for at least 2 months after stopping it. There is a potential for serious side effects to an unborn child. Talk to your health care professional or pharmacist for more information. Do not breast-feed an infant while taking this medicine. What side effects may I notice from receiving this medicine? Side effects that you should report to your doctor or health care professional as soon as possible: -low blood counts - this medicine may decrease the number of white blood cells, red blood cells and platelets. You may be at increased risk for infections and bleeding. -signs of infection - fever or   chills, cough, sore throat, pain or difficulty passing urine -signs of decreased platelets or bleeding - bruising, pinpoint red spots on the skin, black, tarry stools, blood in the urine -signs of decreased red blood cells - unusual weakness or tiredness, fainting spells,  lightheadedness -increased blood sugar Side effects that usually do not require medical attention (report to your doctor or health care professional if they continue or are bothersome): -constipation -diarrhea -headache -loss of appetite -nausea, vomiting -skin rash, itching -stomach pain -water retention -weak or tired This list may not describe all possible side effects. Call your doctor for medical advice about side effects. You may report side effects to FDA at 1-800-FDA-1088. Where should I keep my medicine? This drug is given in a hospital or clinic and will not be stored at home. NOTE: This sheet is a summary. It may not cover all possible information. If you have questions about this medicine, talk to your doctor, pharmacist, or health care provider.  2018 Elsevier/Gold Standard (2015-06-30 15:52:57)  

## 2018-03-27 ENCOUNTER — Inpatient Hospital Stay: Payer: Medicare Other

## 2018-03-27 ENCOUNTER — Other Ambulatory Visit: Payer: Self-pay

## 2018-03-27 VITALS — BP 126/70 | HR 97 | Temp 98.6°F | Resp 18

## 2018-03-27 DIAGNOSIS — M8000XG Age-related osteoporosis with current pathological fracture, unspecified site, subsequent encounter for fracture with delayed healing: Secondary | ICD-10-CM

## 2018-03-27 DIAGNOSIS — D4620 Refractory anemia with excess of blasts, unspecified: Secondary | ICD-10-CM

## 2018-03-27 DIAGNOSIS — D500 Iron deficiency anemia secondary to blood loss (chronic): Secondary | ICD-10-CM

## 2018-03-27 DIAGNOSIS — D4621 Refractory anemia with excess of blasts 1: Secondary | ICD-10-CM | POA: Diagnosis not present

## 2018-03-27 LAB — CMP (CANCER CENTER ONLY)
ALT: 24 U/L (ref 10–47)
ALT: 17 U/L (ref 10–47)
ANION GAP: 4 — AB (ref 5–15)
ANION GAP: 1 — AB (ref 5–15)
AST: 22 U/L (ref 11–38)
AST: 20 U/L (ref 11–38)
Albumin: 3.6 g/dL (ref 3.5–5.0)
Albumin: 3.3 g/dL — ABNORMAL LOW (ref 3.5–5.0)
Alkaline Phosphatase: 79 U/L (ref 26–84)
Alkaline Phosphatase: 77 U/L (ref 26–84)
BILIRUBIN TOTAL: 0.8 mg/dL (ref 0.2–1.6)
BILIRUBIN TOTAL: 0.6 mg/dL (ref 0.2–1.6)
BUN: 14 mg/dL (ref 7–22)
BUN: 14 mg/dL (ref 7–22)
CHLORIDE: 109 mmol/L — AB (ref 98–108)
CO2: 25 mmol/L (ref 18–33)
CO2: 25 mmol/L (ref 18–33)
Calcium: 7.4 mg/dL — ABNORMAL LOW (ref 8.0–10.3)
Calcium: 8.1 mg/dL (ref 8.0–10.3)
Chloride: 110 mmol/L — ABNORMAL HIGH (ref 98–108)
Creatinine: 1.5 mg/dL — ABNORMAL HIGH (ref 0.60–1.20)
Creatinine: 1.3 mg/dL — ABNORMAL HIGH (ref 0.60–1.20)
GLUCOSE: 106 mg/dL (ref 73–118)
Glucose, Bld: 148 mg/dL — ABNORMAL HIGH (ref 73–118)
POTASSIUM: 4 mmol/L (ref 3.3–4.7)
POTASSIUM: 4.2 mmol/L (ref 3.3–4.7)
Sodium: 139 mmol/L (ref 128–145)
Sodium: 135 mmol/L (ref 128–145)
TOTAL PROTEIN: 6 g/dL — AB (ref 6.4–8.1)
Total Protein: 5.8 g/dL — ABNORMAL LOW (ref 6.4–8.1)

## 2018-03-27 LAB — URIC ACID
Uric Acid, Serum: 3.8 mg/dL (ref 3.7–8.6)
Uric Acid, Serum: 3.7 mg/dL (ref 3.7–8.6)

## 2018-03-27 LAB — PHOSPHORUS
PHOSPHORUS: 2 mg/dL — AB (ref 2.5–4.6)
PHOSPHORUS: 2.4 mg/dL — AB (ref 2.5–4.6)

## 2018-03-27 MED ORDER — SODIUM CHLORIDE 0.9% FLUSH
10.0000 mL | INTRAVENOUS | Status: DC | PRN
  Administered 2018-03-27 (×2): 10 mL
  Filled 2018-03-27: qty 10

## 2018-03-27 MED ORDER — SODIUM CHLORIDE 0.9% FLUSH
10.0000 mL | INTRAVENOUS | Status: DC | PRN
  Filled 2018-03-27: qty 10

## 2018-03-27 MED ORDER — SODIUM CHLORIDE 0.9 % IV SOLN
Freq: Once | INTRAVENOUS | Status: AC
  Administered 2018-03-27: 08:00:00 via INTRAVENOUS
  Filled 2018-03-27: qty 250

## 2018-03-27 MED ORDER — HEPARIN SOD (PORK) LOCK FLUSH 100 UNIT/ML IV SOLN
500.0000 [IU] | Freq: Once | INTRAVENOUS | Status: AC | PRN
  Administered 2018-03-27: 500 [IU]
  Filled 2018-03-27: qty 5

## 2018-03-27 MED ORDER — DARBEPOETIN ALFA 500 MCG/ML IJ SOSY: 400.0000 ug | PREFILLED_SYRINGE | Freq: Once | INTRAMUSCULAR | Status: DC

## 2018-03-27 MED ORDER — PROCHLORPERAZINE MALEATE 10 MG PO TABS: 10.0000 mg | ORAL_TABLET | Freq: Once | ORAL | Status: DC

## 2018-03-27 MED ORDER — SODIUM CHLORIDE 0.9 % IV SOLN
Freq: Once | INTRAVENOUS | Status: DC
  Filled 2018-03-27: qty 250

## 2018-03-27 MED ORDER — SODIUM CHLORIDE 0.9 % IV SOLN
20.0000 mg/m2 | Freq: Once | INTRAVENOUS | Status: AC
  Administered 2018-03-27: 45 mg via INTRAVENOUS
  Filled 2018-03-27: qty 9

## 2018-03-27 NOTE — Patient Instructions (Signed)
Implanted Port Home Guide An implanted port is a type of central line that is placed under the skin. Central lines are used to provide IV access when treatment or nutrition needs to be given through a person's veins. Implanted ports are used for long-term IV access. An implanted port may be placed because:  You need IV medicine that would be irritating to the small veins in your hands or arms.  You need long-term IV medicines, such as antibiotics.  You need IV nutrition for a long period.  You need frequent blood draws for lab tests.  You need dialysis.  Implanted ports are usually placed in the chest area, but they can also be placed in the upper arm, the abdomen, or the leg. An implanted port has two main parts:  Reservoir. The reservoir is round and will appear as a small, raised area under your skin. The reservoir is the part where a needle is inserted to give medicines or draw blood.  Catheter. The catheter is a thin, flexible tube that extends from the reservoir. The catheter is placed into a large vein. Medicine that is inserted into the reservoir goes into the catheter and then into the vein.  How will I care for my incision site? Do not get the incision site wet. Bathe or shower as directed by your health care provider. How is my port accessed? Special steps must be taken to access the port:  Before the port is accessed, a numbing cream can be placed on the skin. This helps numb the skin over the port site.  Your health care provider uses a sterile technique to access the port. ? Your health care provider must put on a mask and sterile gloves. ? The skin over your port is cleaned carefully with an antiseptic and allowed to dry. ? The port is gently pinched between sterile gloves, and a needle is inserted into the port.  Only "non-coring" port needles should be used to access the port. Once the port is accessed, a blood return should be checked. This helps ensure that the port  is in the vein and is not clogged.  If your port needs to remain accessed for a constant infusion, a clear (transparent) bandage will be placed over the needle site. The bandage and needle will need to be changed every week, or as directed by your health care provider.  Keep the bandage covering the needle clean and dry. Do not get it wet. Follow your health care provider's instructions on how to take a shower or bath while the port is accessed.  If your port does not need to stay accessed, no bandage is needed over the port.  What is flushing? Flushing helps keep the port from getting clogged. Follow your health care provider's instructions on how and when to flush the port. Ports are usually flushed with saline solution or a medicine called heparin. The need for flushing will depend on how the port is used.  If the port is used for intermittent medicines or blood draws, the port will need to be flushed: ? After medicines have been given. ? After blood has been drawn. ? As part of routine maintenance.  If a constant infusion is running, the port may not need to be flushed.  How long will my port stay implanted? The port can stay in for as long as your health care provider thinks it is needed. When it is time for the port to come out, surgery will be   done to remove it. The procedure is similar to the one performed when the port was put in. When should I seek immediate medical care? When you have an implanted port, you should seek immediate medical care if:  You notice a bad smell coming from the incision site.  You have swelling, redness, or drainage at the incision site.  You have more swelling or pain at the port site or the surrounding area.  You have a fever that is not controlled with medicine.  This information is not intended to replace advice given to you by your health care provider. Make sure you discuss any questions you have with your health care provider. Document  Released: 05/28/2005 Document Revised: 11/03/2015 Document Reviewed: 02/02/2013 Elsevier Interactive Patient Education  2017 [employer]  

## 2018-03-27 NOTE — Patient Instructions (Signed)
[facility] Discharge Instructions for Patients Receiving Chemotherapy  Today you received the following chemotherapy agents Decitabine To help prevent nausea and vomiting after your treatment, we encourage you to take your nausea medication as prescribed.  If you develop nausea and vomiting that is not controlled by your nausea medication, call the clinic.   BELOW ARE SYMPTOMS THAT SHOULD BE REPORTED IMMEDIATELY:  *FEVER GREATER THAN 100.5 F  *CHILLS WITH OR WITHOUT FEVER  NAUSEA AND VOMITING THAT IS NOT CONTROLLED WITH YOUR NAUSEA MEDICATION  *UNUSUAL SHORTNESS OF BREATH  *UNUSUAL BRUISING OR BLEEDING  TENDERNESS IN MOUTH AND THROAT WITH OR WITHOUT PRESENCE OF ULCERS  *URINARY PROBLEMS  *BOWEL PROBLEMS  UNUSUAL RASH Items with * indicate a potential emergency and should be followed up as soon as possible.  Feel free to call the clinic should you have any questions or concerns. The clinic phone number is [phone].  Please show the CHEMO ALERT CARD at check-in to the Emergency Department and triage nurse.   

## 2018-03-28 ENCOUNTER — Inpatient Hospital Stay: Payer: Medicare Other

## 2018-03-28 ENCOUNTER — Other Ambulatory Visit: Payer: Medicare Other

## 2018-03-28 VITALS — BP 121/78 | HR 94 | Temp 98.6°F | Resp 18

## 2018-03-28 DIAGNOSIS — D4620 Refractory anemia with excess of blasts, unspecified: Secondary | ICD-10-CM

## 2018-03-28 DIAGNOSIS — D4621 Refractory anemia with excess of blasts 1: Secondary | ICD-10-CM | POA: Diagnosis not present

## 2018-03-28 LAB — CMP (CANCER CENTER ONLY)
ALT: 21 U/L (ref 10–47)
ANION GAP: 2 — AB (ref 5–15)
AST: 23 U/L (ref 11–38)
Albumin: 3.5 g/dL (ref 3.5–5.0)
Alkaline Phosphatase: 79 U/L (ref 26–84)
BILIRUBIN TOTAL: 0.7 mg/dL (ref 0.2–1.6)
BUN: 13 mg/dL (ref 7–22)
CALCIUM: 8.3 mg/dL (ref 8.0–10.3)
CHLORIDE: 112 mmol/L — AB (ref 98–108)
CO2: 25 mmol/L (ref 18–33)
Creatinine: 1.3 mg/dL — ABNORMAL HIGH (ref 0.60–1.20)
Glucose, Bld: 110 mg/dL (ref 73–118)
POTASSIUM: 3.7 mmol/L (ref 3.3–4.7)
Sodium: 139 mmol/L (ref 128–145)
Total Protein: 6 g/dL — ABNORMAL LOW (ref 6.4–8.1)

## 2018-03-28 LAB — URIC ACID: Uric Acid, Serum: 3.8 mg/dL (ref 3.7–8.6)

## 2018-03-28 LAB — PHOSPHORUS: PHOSPHORUS: 1.6 mg/dL — AB (ref 2.5–4.6)

## 2018-03-28 MED ORDER — SODIUM CHLORIDE 0.9 % IV SOLN
Freq: Once | INTRAVENOUS | Status: AC
  Administered 2018-03-28: 08:00:00 via INTRAVENOUS
  Filled 2018-03-28: qty 250

## 2018-03-28 MED ORDER — HEPARIN SOD (PORK) LOCK FLUSH 100 UNIT/ML IV SOLN
500.0000 [IU] | Freq: Once | INTRAVENOUS | Status: AC | PRN
  Administered 2018-03-28: 500 [IU]
  Filled 2018-03-28: qty 5

## 2018-03-28 MED ORDER — SODIUM CHLORIDE 0.9% FLUSH
10.0000 mL | INTRAVENOUS | Status: DC | PRN
  Administered 2018-03-28: 10 mL
  Filled 2018-03-28: qty 10

## 2018-03-28 MED ORDER — SODIUM CHLORIDE 0.9 % IV SOLN
20.0000 mg/m2 | Freq: Once | INTRAVENOUS | Status: AC
  Administered 2018-03-28: 45 mg via INTRAVENOUS
  Filled 2018-03-28: qty 9

## 2018-03-28 NOTE — Telephone Encounter (Addendum)
Oral Chemotherapy Pharmacist Encounter   Reviewed pre-dose labs from10/17/19. I called on 03/27/18 in the afternoon to check in on [place] confirmed that he took his third dose of venetoclax, ventetoclax 400mg  (4 tablet), while in clinic. She confirmed that Mr. Blandon did take 400mg . She stated he was going well.  Reviewed the plan with them to continue at 400mg  (4 tablets) daily. [name] stated her understanding.Well know to call with any concerns. Will touch based with them this afternoon.  Labs from this morning 03/28/18 looked good.   Darl Pikes, PharmD, BCPS, [facility] Hematology/Oncology Clinical Pharmacist [facility] [phone]  03/28/2018 12:30 PM

## 2018-03-28 NOTE — Telephone Encounter (Signed)
Oral Chemotherapy Pharmacist Encounter   Reviewedpre-doselabs from10/18/19.I called this afternoon to check in on Kara and [name]. She confirmed that Mr. Bogard did take 400mg  at clinic today. She stated he was going well.  Reviewed the plan with them to continue at 400mg  (4tablets) daily. [name] stated her understanding.Well know to call with any concerns.  Darl Pikes, PharmD, BCPS, [facility] Hematology/Oncology Clinical Pharmacist [facility] [phone]  03/28/2018 3:39 PM

## 2018-03-28 NOTE — Patient Instructions (Signed)
[facility] Discharge Instructions for Patients Receiving Chemotherapy  Today you received the following chemotherapy agents:  Dacogen  To help prevent nausea and vomiting after your treatment, we encourage you to take your nausea medication as ordered per MD.    If you develop nausea and vomiting that is not controlled by your nausea medication, call the clinic.   BELOW ARE SYMPTOMS THAT SHOULD BE REPORTED IMMEDIATELY:  *FEVER GREATER THAN 100.5 F  *CHILLS WITH OR WITHOUT FEVER  NAUSEA AND VOMITING THAT IS NOT CONTROLLED WITH YOUR NAUSEA MEDICATION  *UNUSUAL SHORTNESS OF BREATH  *UNUSUAL BRUISING OR BLEEDING  TENDERNESS IN MOUTH AND THROAT WITH OR WITHOUT PRESENCE OF ULCERS  *URINARY PROBLEMS  *BOWEL PROBLEMS  UNUSUAL RASH Items with * indicate a potential emergency and should be followed up as soon as possible.  Feel free to call the clinic should you have any questions or concerns. The clinic phone number is [phone].  Please show the CHEMO ALERT CARD at check-in to the Emergency Department and triage nurse.   

## 2018-03-31 ENCOUNTER — Other Ambulatory Visit: Payer: Self-pay | Admitting: Family

## 2018-03-31 ENCOUNTER — Inpatient Hospital Stay: Payer: Medicare Other

## 2018-03-31 ENCOUNTER — Other Ambulatory Visit: Payer: Self-pay | Admitting: *Deleted

## 2018-03-31 VITALS — BP 147/79 | HR 90 | Temp 97.8°F | Resp 19

## 2018-03-31 DIAGNOSIS — D4620 Refractory anemia with excess of blasts, unspecified: Secondary | ICD-10-CM

## 2018-03-31 DIAGNOSIS — D649 Anemia, unspecified: Secondary | ICD-10-CM

## 2018-03-31 DIAGNOSIS — D508 Other iron deficiency anemias: Secondary | ICD-10-CM

## 2018-03-31 DIAGNOSIS — D630 Anemia in neoplastic disease: Secondary | ICD-10-CM

## 2018-03-31 DIAGNOSIS — D46Z Other myelodysplastic syndromes: Secondary | ICD-10-CM

## 2018-03-31 DIAGNOSIS — Z95828 Presence of other vascular implants and grafts: Secondary | ICD-10-CM

## 2018-03-31 DIAGNOSIS — D4621 Refractory anemia with excess of blasts 1: Secondary | ICD-10-CM | POA: Diagnosis not present

## 2018-03-31 LAB — CMP (CANCER CENTER ONLY)
ALK PHOS: 72 U/L (ref 26–84)
ALT: 21 U/L (ref 10–47)
AST: 24 U/L (ref 11–38)
Albumin: 3.7 g/dL (ref 3.5–5.0)
Anion gap: 0 — ABNORMAL LOW (ref 5–15)
BUN: 15 mg/dL (ref 7–22)
CALCIUM: 8.2 mg/dL (ref 8.0–10.3)
CO2: 26 mmol/L (ref 18–33)
CREATININE: 1.2 mg/dL (ref 0.60–1.20)
Chloride: 114 mmol/L — ABNORMAL HIGH (ref 98–108)
Glucose, Bld: 102 mg/dL (ref 73–118)
POTASSIUM: 4.1 mmol/L (ref 3.3–4.7)
SODIUM: 136 mmol/L (ref 128–145)
TOTAL PROTEIN: 6.1 g/dL — AB (ref 6.4–8.1)
Total Bilirubin: 0.8 mg/dL (ref 0.2–1.6)

## 2018-03-31 LAB — CBC WITH DIFFERENTIAL (CANCER CENTER ONLY)
ABS IMMATURE GRANULOCYTES: 0.07 10*3/uL (ref 0.00–0.07)
Basophils Absolute: 0 10*3/uL (ref 0.0–0.1)
Basophils Relative: 0 %
Eosinophils Absolute: 0 10*3/uL (ref 0.0–0.5)
Eosinophils Relative: 1 %
HCT: 24.8 % — ABNORMAL LOW (ref 39.0–52.0)
HEMOGLOBIN: 7.8 g/dL — AB (ref 13.0–17.0)
IMMATURE GRANULOCYTES: 5 %
LYMPHS PCT: 18 %
Lymphs Abs: 0.2 10*3/uL — ABNORMAL LOW (ref 0.7–4.0)
MCH: 33.9 pg (ref 26.0–34.0)
MCHC: 31.5 g/dL (ref 30.0–36.0)
MCV: 107.8 fL — ABNORMAL HIGH (ref 80.0–100.0)
MONO ABS: 0.1 10*3/uL (ref 0.1–1.0)
Monocytes Relative: 4 %
NEUTROS ABS: 1 10*3/uL — AB (ref 1.7–7.7)
Neutrophils Relative %: 72 %
Platelet Count: 81 10*3/uL — ABNORMAL LOW (ref 150–400)
RBC: 2.3 MIL/uL — AB (ref 4.22–5.81)
RDW: 19.9 % — ABNORMAL HIGH (ref 11.5–15.5)
WBC: 1.4 10*3/uL — AB (ref 4.0–10.5)
nRBC: 0 % (ref 0.0–0.2)

## 2018-03-31 LAB — SAMPLE TO BLOOD BANK

## 2018-03-31 LAB — PREPARE RBC (CROSSMATCH)

## 2018-03-31 MED ORDER — SODIUM CHLORIDE 0.9% FLUSH
10.0000 mL | INTRAVENOUS | Status: DC | PRN
  Administered 2018-03-31: 10 mL via INTRAVENOUS
  Filled 2018-03-31: qty 10

## 2018-03-31 MED ORDER — HEPARIN SOD (PORK) LOCK FLUSH 100 UNIT/ML IV SOLN
500.0000 [IU] | Freq: Once | INTRAVENOUS | Status: AC
  Administered 2018-03-31: 500 [IU] via INTRAVENOUS
  Filled 2018-03-31: qty 5

## 2018-03-31 NOTE — Patient Instructions (Signed)
Implanted Port Insertion, Care After °This sheet gives you information about how to care for yourself after your procedure. Your health care provider may also give you more specific instructions. If you have problems or questions, contact your health care provider. °What can I expect after the procedure? °After your procedure, it is common to have: °· Discomfort at the port insertion site. °· Bruising on the skin over the port. This should improve over 3-4 days. ° °Follow these instructions at home: °Port care °· After your port is placed, you will get a manufacturer's information card. The card has information about your port. Keep this card with you at all times. °· Take care of the port as told by your health care provider. Ask your health care provider if you or a family member can get training for taking care of the port at home. A home health care nurse may also take care of the port. °· Make sure to remember what type of port you have. °Incision care °· Follow instructions from your health care provider about how to take care of your port insertion site. Make sure you: °? Wash your hands with soap and water before you change your bandage (dressing). If soap and water are not available, use hand sanitizer. °? Change your dressing as told by your health care provider. °? Leave stitches (sutures), skin glue, or adhesive strips in place. These skin closures may need to stay in place for 2 weeks or longer. If adhesive strip edges start to loosen and curl up, you may trim the loose edges. Do not remove adhesive strips completely unless your health care provider tells you to do that. °· Check your port insertion site every day for signs of infection. Check for: °? More redness, swelling, or pain. °? More fluid or blood. °? Warmth. °? Pus or a bad smell. °General instructions °· Do not take baths, swim, or use a hot tub until your health care provider approves. °· Do not lift anything that is heavier than 10 lb (4.5  kg) for a week, or as told by your health care provider. °· Ask your health care provider when it is okay to: °? Return to work or school. °? Resume usual physical activities or sports. °· Do not drive for 24 hours if you were given a medicine to help you relax (sedative). °· Take over-the-counter and prescription medicines only as told by your health care provider. °· Wear a medical alert bracelet in case of an emergency. This will tell any health care providers that you have a port. °· Keep all follow-up visits as told by your health care provider. This is important. °Contact a health care provider if: °· You cannot flush your port with saline as directed, or you cannot draw blood from the port. °· You have a fever or chills. °· You have more redness, swelling, or pain around your port insertion site. °· You have more fluid or blood coming from your port insertion site. °· Your port insertion site feels warm to the touch. °· You have pus or a bad smell coming from the port insertion site. °Get help right away if: °· You have chest pain or shortness of breath. °· You have bleeding from your port that you cannot control. °Summary °· Take care of the port as told by your health care provider. °· Change your dressing as told by your health care provider. °· Keep all follow-up visits as told by your health care provider. °  This information is not intended to replace advice given to you by your health care provider. Make sure you discuss any questions you have with your health care provider. °Document Released: 03/18/2013 Document Revised: 04/18/2016 Document Reviewed: 04/18/2016 °Elsevier Interactive Patient Education © 2017 [employer] ° °

## 2018-04-01 ENCOUNTER — Inpatient Hospital Stay: Payer: Medicare Other

## 2018-04-01 DIAGNOSIS — D649 Anemia, unspecified: Secondary | ICD-10-CM

## 2018-04-01 DIAGNOSIS — D4621 Refractory anemia with excess of blasts 1: Secondary | ICD-10-CM | POA: Diagnosis not present

## 2018-04-01 DIAGNOSIS — D46Z Other myelodysplastic syndromes: Secondary | ICD-10-CM

## 2018-04-01 MED ORDER — FUROSEMIDE 10 MG/ML IJ SOLN
INTRAMUSCULAR | Status: AC
  Filled 2018-04-01: qty 4

## 2018-04-01 MED ORDER — DIPHENHYDRAMINE HCL 25 MG PO CAPS
ORAL_CAPSULE | ORAL | Status: AC
  Filled 2018-04-01: qty 1

## 2018-04-01 MED ORDER — FUROSEMIDE 10 MG/ML IJ SOLN
20.0000 mg | Freq: Once | INTRAMUSCULAR | Status: AC
  Administered 2018-04-01: 20 mg via INTRAVENOUS

## 2018-04-01 MED ORDER — DIPHENHYDRAMINE HCL 25 MG PO CAPS
25.0000 mg | ORAL_CAPSULE | Freq: Once | ORAL | Status: AC
  Administered 2018-04-01: 25 mg via ORAL

## 2018-04-01 MED ORDER — ACETAMINOPHEN 325 MG PO TABS
650.0000 mg | ORAL_TABLET | Freq: Once | ORAL | Status: AC
  Administered 2018-04-01: 650 mg via ORAL

## 2018-04-01 MED ORDER — ACETAMINOPHEN 325 MG PO TABS
ORAL_TABLET | ORAL | Status: AC
  Filled 2018-04-01: qty 2

## 2018-04-01 MED ORDER — SODIUM CHLORIDE 0.9% FLUSH
10.0000 mL | INTRAVENOUS | Status: AC | PRN
  Administered 2018-04-01: 10 mL
  Filled 2018-04-01: qty 10

## 2018-04-01 MED ORDER — HEPARIN SOD (PORK) LOCK FLUSH 100 UNIT/ML IV SOLN
500.0000 [IU] | Freq: Every day | INTRAVENOUS | Status: AC | PRN
  Administered 2018-04-01: 500 [IU]
  Filled 2018-04-01: qty 5

## 2018-04-01 MED ORDER — SODIUM CHLORIDE 0.9% IV SOLUTION
250.0000 mL | Freq: Once | INTRAVENOUS | Status: AC
  Administered 2018-04-01: 250 mL via INTRAVENOUS
  Filled 2018-04-01: qty 250

## 2018-04-01 NOTE — Patient Instructions (Signed)

## 2018-04-02 LAB — BPAM RBC
Blood Product Expiration Date: 201911132359
ISSUE DATE / TIME: 201910220752
Unit Type and Rh: 5100

## 2018-04-02 LAB — TYPE AND SCREEN
ABO/RH(D): O POS
Antibody Screen: NEGATIVE
Unit division: 0

## 2018-04-07 ENCOUNTER — Other Ambulatory Visit: Payer: Self-pay

## 2018-04-07 ENCOUNTER — Inpatient Hospital Stay: Payer: Medicare Other

## 2018-04-07 ENCOUNTER — Telehealth: Payer: Self-pay | Admitting: *Deleted

## 2018-04-07 ENCOUNTER — Other Ambulatory Visit: Payer: Self-pay | Admitting: *Deleted

## 2018-04-07 VITALS — BP 139/79 | HR 78 | Temp 98.0°F | Resp 18

## 2018-04-07 DIAGNOSIS — D4621 Refractory anemia with excess of blasts 1: Secondary | ICD-10-CM | POA: Diagnosis not present

## 2018-04-07 DIAGNOSIS — D46Z Other myelodysplastic syndromes: Secondary | ICD-10-CM

## 2018-04-07 DIAGNOSIS — D4620 Refractory anemia with excess of blasts, unspecified: Secondary | ICD-10-CM

## 2018-04-07 DIAGNOSIS — D630 Anemia in neoplastic disease: Secondary | ICD-10-CM

## 2018-04-07 DIAGNOSIS — D508 Other iron deficiency anemias: Secondary | ICD-10-CM

## 2018-04-07 DIAGNOSIS — Z95828 Presence of other vascular implants and grafts: Secondary | ICD-10-CM

## 2018-04-07 LAB — SAVE SMEAR(SSMR), FOR PROVIDER SLIDE REVIEW

## 2018-04-07 LAB — CBC WITH DIFFERENTIAL (CANCER CENTER ONLY)
Abs Immature Granulocytes: 0.01 10*3/uL (ref 0.00–0.07)
BASOS ABS: 0 10*3/uL (ref 0.0–0.1)
Basophils Relative: 0 %
EOS ABS: 0 10*3/uL (ref 0.0–0.5)
EOS PCT: 0 %
HEMATOCRIT: 29.7 % — AB (ref 39.0–52.0)
HEMOGLOBIN: 9.5 g/dL — AB (ref 13.0–17.0)
Immature Granulocytes: 2 %
LYMPHS ABS: 0.3 10*3/uL — AB (ref 0.7–4.0)
LYMPHS PCT: 63 %
MCH: 32.5 pg (ref 26.0–34.0)
MCHC: 32 g/dL (ref 30.0–36.0)
MCV: 101.7 fL — ABNORMAL HIGH (ref 80.0–100.0)
MONOS PCT: 8 %
Monocytes Absolute: 0 10*3/uL — ABNORMAL LOW (ref 0.1–1.0)
Neutro Abs: 0.1 10*3/uL — CL (ref 1.7–7.7)
Neutrophils Relative %: 27 %
Platelet Count: 31 10*3/uL — ABNORMAL LOW (ref 150–400)
RBC: 2.92 MIL/uL — ABNORMAL LOW (ref 4.22–5.81)
RDW: 18.7 % — ABNORMAL HIGH (ref 11.5–15.5)
WBC Count: 0.5 10*3/uL — CL (ref 4.0–10.5)
nRBC: 4.2 % — ABNORMAL HIGH (ref 0.0–0.2)

## 2018-04-07 LAB — CMP (CANCER CENTER ONLY)
ALBUMIN: 3.7 g/dL (ref 3.5–5.0)
ALT: 27 U/L (ref 10–47)
AST: 21 U/L (ref 11–38)
Alkaline Phosphatase: 68 U/L (ref 26–84)
Anion gap: 2 — ABNORMAL LOW (ref 5–15)
BILIRUBIN TOTAL: 0.6 mg/dL (ref 0.2–1.6)
BUN: 18 mg/dL (ref 7–22)
CALCIUM: 8.3 mg/dL (ref 8.0–10.3)
CHLORIDE: 109 mmol/L — AB (ref 98–108)
CO2: 25 mmol/L (ref 18–33)
CREATININE: 1.3 mg/dL — AB (ref 0.60–1.20)
GLUCOSE: 108 mg/dL (ref 73–118)
Potassium: 3.9 mmol/L (ref 3.3–4.7)
Sodium: 136 mmol/L (ref 128–145)
Total Protein: 6.5 g/dL (ref 6.4–8.1)

## 2018-04-07 LAB — SAMPLE TO BLOOD BANK

## 2018-04-07 LAB — LACTATE DEHYDROGENASE: LDH: 216 U/L — AB (ref 98–192)

## 2018-04-07 MED ORDER — SODIUM CHLORIDE 0.9 % IJ SOLN
10.0000 mL | Freq: Once | INTRAMUSCULAR | Status: AC
  Administered 2018-04-07: 10 mL
  Filled 2018-04-07: qty 10

## 2018-04-07 MED ORDER — HEPARIN SOD (PORK) LOCK FLUSH 100 UNIT/ML IV SOLN
500.0000 [IU] | Freq: Once | INTRAVENOUS | Status: AC
  Administered 2018-04-07: 500 [IU] via INTRAVENOUS
  Filled 2018-04-07: qty 5

## 2018-04-07 MED ORDER — LEVOFLOXACIN 500 MG PO TABS: 500.0000 mg | ORAL_TABLET | Freq: Every day | ORAL | 1 refills | Status: DC

## 2018-04-07 NOTE — Telephone Encounter (Signed)
Dr. Marin Olp notified of WBC-0.5 and ANC-0.1.  Order received from Dr. Marin Olp for pt to receive Levaquin 500 mg daily.  Call placed to patient's wife to inform her of new orders.  Pt.'s wife appreciative of call and has no questions at this time.

## 2018-04-07 NOTE — Patient Instructions (Signed)
Implanted Port Home Guide An implanted port is a type of central line that is placed under the skin. Central lines are used to provide IV access when treatment or nutrition needs to be given through a person's veins. Implanted ports are used for long-term IV access. An implanted port may be placed because:  You need IV medicine that would be irritating to the small veins in your hands or arms.  You need long-term IV medicines, such as antibiotics.  You need IV nutrition for a long period.  You need frequent blood draws for lab tests.  You need dialysis.  Implanted ports are usually placed in the chest area, but they can also be placed in the upper arm, the abdomen, or the leg. An implanted port has two main parts:  Reservoir. The reservoir is round and will appear as a small, raised area under your skin. The reservoir is the part where a needle is inserted to give medicines or draw blood.  Catheter. The catheter is a thin, flexible tube that extends from the reservoir. The catheter is placed into a large vein. Medicine that is inserted into the reservoir goes into the catheter and then into the vein.  How will I care for my incision site? Do not get the incision site wet. Bathe or shower as directed by your health care provider. How is my port accessed? Special steps must be taken to access the port:  Before the port is accessed, a numbing cream can be placed on the skin. This helps numb the skin over the port site.  Your health care provider uses a sterile technique to access the port. ? Your health care provider must put on a mask and sterile gloves. ? The skin over your port is cleaned carefully with an antiseptic and allowed to dry. ? The port is gently pinched between sterile gloves, and a needle is inserted into the port.  Only "non-coring" port needles should be used to access the port. Once the port is accessed, a blood return should be checked. This helps ensure that the port  is in the vein and is not clogged.  If your port needs to remain accessed for a constant infusion, a clear (transparent) bandage will be placed over the needle site. The bandage and needle will need to be changed every week, or as directed by your health care provider.  Keep the bandage covering the needle clean and dry. Do not get it wet. Follow your health care provider's instructions on how to take a shower or bath while the port is accessed.  If your port does not need to stay accessed, no bandage is needed over the port.  What is flushing? Flushing helps keep the port from getting clogged. Follow your health care provider's instructions on how and when to flush the port. Ports are usually flushed with saline solution or a medicine called heparin. The need for flushing will depend on how the port is used.  If the port is used for intermittent medicines or blood draws, the port will need to be flushed: ? After medicines have been given. ? After blood has been drawn. ? As part of routine maintenance.  If a constant infusion is running, the port may not need to be flushed.  How long will my port stay implanted? The port can stay in for as long as your health care provider thinks it is needed. When it is time for the port to come out, surgery will be   done to remove it. The procedure is similar to the one performed when the port was put in. When should I seek immediate medical care? When you have an implanted port, you should seek immediate medical care if:  You notice a bad smell coming from the incision site.  You have swelling, redness, or drainage at the incision site.  You have more swelling or pain at the port site or the surrounding area.  You have a fever that is not controlled with medicine.  This information is not intended to replace advice given to you by your health care provider. Make sure you discuss any questions you have with your health care provider. Document  Released: 05/28/2005 Document Revised: 11/03/2015 Document Reviewed: 02/02/2013 Elsevier Interactive Patient Education  2017 [employer]  

## 2018-04-08 ENCOUNTER — Encounter: Payer: Self-pay | Admitting: Hematology & Oncology

## 2018-04-09 MED ORDER — DIPHENHYDRAMINE HCL 25 MG PO CAPS
ORAL_CAPSULE | ORAL | Status: AC
  Filled 2018-04-09: qty 1

## 2018-04-14 ENCOUNTER — Telehealth: Payer: Self-pay | Admitting: *Deleted

## 2018-04-14 ENCOUNTER — Inpatient Hospital Stay: Payer: Medicare Other

## 2018-04-14 ENCOUNTER — Inpatient Hospital Stay: Payer: Medicare Other | Attending: Family | Admitting: Hematology & Oncology

## 2018-04-14 ENCOUNTER — Other Ambulatory Visit: Payer: Self-pay

## 2018-04-14 ENCOUNTER — Encounter: Payer: Self-pay | Admitting: Hematology & Oncology

## 2018-04-14 VITALS — BP 110/68 | HR 96 | Temp 98.4°F | Resp 18

## 2018-04-14 DIAGNOSIS — D46Z Other myelodysplastic syndromes: Secondary | ICD-10-CM

## 2018-04-14 DIAGNOSIS — D4620 Refractory anemia with excess of blasts, unspecified: Secondary | ICD-10-CM

## 2018-04-14 DIAGNOSIS — Z79899 Other long term (current) drug therapy: Secondary | ICD-10-CM

## 2018-04-14 DIAGNOSIS — D508 Other iron deficiency anemias: Secondary | ICD-10-CM

## 2018-04-14 DIAGNOSIS — D630 Anemia in neoplastic disease: Secondary | ICD-10-CM

## 2018-04-14 DIAGNOSIS — D469 Myelodysplastic syndrome, unspecified: Secondary | ICD-10-CM | POA: Diagnosis not present

## 2018-04-14 DIAGNOSIS — E790 Hyperuricemia without signs of inflammatory arthritis and tophaceous disease: Secondary | ICD-10-CM

## 2018-04-14 DIAGNOSIS — D4621 Refractory anemia with excess of blasts 1: Secondary | ICD-10-CM

## 2018-04-14 DIAGNOSIS — Q999 Chromosomal abnormality, unspecified: Secondary | ICD-10-CM

## 2018-04-14 DIAGNOSIS — Z95828 Presence of other vascular implants and grafts: Secondary | ICD-10-CM

## 2018-04-14 LAB — RETIC PANEL
IMMATURE RETIC FRACT: 25.2 % — AB (ref 2.3–15.9)
RBC.: 2.84 MIL/uL — ABNORMAL LOW (ref 4.22–5.81)
RETIC CT PCT: 3.2 % — AB (ref 0.4–3.1)
RETICULOCYTE HEMOGLOBIN: 36.1 pg (ref >27.9)
Retic Count, Absolute: 87.6 10*3/uL (ref 19.0–186.0)

## 2018-04-14 LAB — CMP (CANCER CENTER ONLY)
ALK PHOS: 56 U/L (ref 26–84)
ALT: 16 U/L (ref 10–47)
ANION GAP: 10 (ref 5–15)
AST: 22 U/L (ref 11–38)
Albumin: 3.3 g/dL — ABNORMAL LOW (ref 3.5–5.0)
BILIRUBIN TOTAL: 0.6 mg/dL (ref 0.2–1.6)
BUN: 18 mg/dL (ref 7–22)
CHLORIDE: 104 mmol/L (ref 98–108)
CO2: 26 mmol/L (ref 18–33)
Calcium: 7.8 mg/dL — ABNORMAL LOW (ref 8.0–10.3)
Creatinine: 1.6 mg/dL — ABNORMAL HIGH (ref 0.60–1.20)
Glucose, Bld: 98 mg/dL (ref 73–118)
Potassium: 3.8 mmol/L (ref 3.3–4.7)
Sodium: 140 mmol/L (ref 128–145)
TOTAL PROTEIN: 5.9 g/dL — AB (ref 6.4–8.1)

## 2018-04-14 LAB — CBC WITH DIFFERENTIAL (CANCER CENTER ONLY)
ABS IMMATURE GRANULOCYTES: 0 10*3/uL (ref 0.00–0.07)
Basophils Absolute: 0 10*3/uL (ref 0.0–0.1)
Basophils Relative: 0 %
EOS PCT: 0 %
Eosinophils Absolute: 0 10*3/uL (ref 0.0–0.5)
HCT: 29.3 % — ABNORMAL LOW (ref 39.0–52.0)
HEMOGLOBIN: 9.2 g/dL — AB (ref 13.0–17.0)
Immature Granulocytes: 0 %
LYMPHS PCT: 59 %
Lymphs Abs: 0.2 10*3/uL — ABNORMAL LOW (ref 0.7–4.0)
MCH: 32.4 pg (ref 26.0–34.0)
MCHC: 31.4 g/dL (ref 30.0–36.0)
MCV: 103.2 fL — AB (ref 80.0–100.0)
MONO ABS: 0 10*3/uL — AB (ref 0.1–1.0)
MONOS PCT: 10 %
Neutro Abs: 0.1 10*3/uL — CL (ref 1.7–7.7)
Neutrophils Relative %: 31 %
Platelet Count: 305 10*3/uL (ref 150–400)
RBC: 2.84 MIL/uL — ABNORMAL LOW (ref 4.22–5.81)
RDW: 20.2 % — ABNORMAL HIGH (ref 11.5–15.5)
WBC Count: 0.3 10*3/uL — CL (ref 4.0–10.5)
nRBC: 0 % (ref 0.0–0.2)

## 2018-04-14 LAB — IRON AND TIBC
IRON: 102 ug/dL (ref 42–163)
SATURATION RATIOS: 45 % (ref 20–55)
TIBC: 228 ug/dL (ref 202–409)
UIBC: 125 ug/dL (ref 117–376)

## 2018-04-14 LAB — LACTATE DEHYDROGENASE: LDH: 190 U/L (ref 98–192)

## 2018-04-14 LAB — SAVE SMEAR(SSMR), FOR PROVIDER SLIDE REVIEW

## 2018-04-14 LAB — FERRITIN: Ferritin: 2841 ng/mL — ABNORMAL HIGH (ref 24–336)

## 2018-04-14 LAB — PHOSPHORUS: PHOSPHORUS: 2.2 mg/dL — AB (ref 2.5–4.6)

## 2018-04-14 LAB — SAMPLE TO BLOOD BANK

## 2018-04-14 LAB — URIC ACID: URIC ACID, SERUM: 3.8 mg/dL (ref 3.7–8.6)

## 2018-04-14 MED ORDER — SODIUM CHLORIDE 0.9% FLUSH
10.0000 mL | INTRAVENOUS | Status: DC | PRN
  Administered 2018-04-14: 10 mL via INTRAVENOUS
  Filled 2018-04-14: qty 10

## 2018-04-14 MED ORDER — HEPARIN SOD (PORK) LOCK FLUSH 100 UNIT/ML IV SOLN
500.0000 [IU] | Freq: Once | INTRAVENOUS | Status: AC
  Administered 2018-04-14: 500 [IU] via INTRAVENOUS
  Filled 2018-04-14: qty 5

## 2018-04-14 NOTE — Telephone Encounter (Signed)
Critical Value ANC 0.1 WBC 0.3 Dr Marin Olp notified. No orders at this time

## 2018-04-14 NOTE — Progress Notes (Addendum)
Hematology and Oncology Follow Up Visit  Anthony [name] [id number] 04-24-1941 77 y.o. 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  It is sometimes hard to tell with Anthony [name].  We now have him on venetoclax along with the decitabine.  So far, he is tolerated this pretty well.  His back does not seem to be bothering him as much.  He fell back in May.  This really caused him a lot of aggravation and a lot of discomfort.  He had compression fractures at T2, T8, and T12.  I think that T8 and T12 were probably causing the most discomfort.    He has had no fever.  He has had no bleeding.  He is seems to be going to the bathroom okay.    His labs today show a white count of 0.3.  Hemoglobin 9.2.  Platelet count 305,000.  His iron studies show a ferritin of 2800 with an iron saturation of 45%.  He has not had any obvious leg swelling.  There is been no ecchymoses.  Overall, I would say that his performance status is ECOG 1-2.    Medications:  Allergies as of 04/14/2018   No Known Allergies     Medication List        Accurate as of 04/14/18  9:01 AM. Always use your most recent med list.          allopurinol 100 MG tablet Commonly known as:  ZYLOPRIM Take 1 tablet (100 mg total) by mouth daily.   bimatoprost 0.01 % Soln Commonly known as:  LUMIGAN Place 1 drop into both eyes at bedtime.   budesonide-formoterol 160-4.5 MCG/ACT inhaler Commonly known as:  SYMBICORT Inhale 2 puffs into the lungs 2 (two) times  daily.   buPROPion 150 MG 24 hr tablet Commonly known as:  WELLBUTRIN XL Take 150 mg by mouth daily.   CITRACAL PO Take 1,000 Units by mouth 2 (two) times daily.   clopidogrel 75 MG tablet Commonly known as:  PLAVIX Take 75 mg by mouth daily.   co-enzyme Q-10 30 MG capsule Take 100 mg by mouth daily.   Cyanocobalamin 2500 MCG Tabs Take 5,000 mcg by mouth daily.   fish oil-omega-3 fatty acids 1000 MG capsule Take 1 g by mouth daily.   glucosamine-chondroitin 500-400 MG tablet Take 1 tablet by mouth daily.   HYDROcodone-acetaminophen 7.5-325 MG tablet Commonly known as:  NORCO TAKE 1 TABLET BY MOUTH AS NEEDED EVERY 6 HOURS FOR 7 DAYS   levofloxacin 500 MG tablet Commonly known as:  LEVAQUIN Take 1 tablet (500 mg total) by mouth daily.   LORazepam 0.5 MG tablet Commonly known as:  ATIVAN Take 1 tablet (0.5 mg total) by mouth every 6 (six) hours as needed (Nausea or vomiting).   magnesium oxide 400 MG tablet Commonly known as:  MAG-OX Take 400 mg by mouth daily.   Menthol (Topical Analgesic) 2.5 % Gel Apply topically.   NEURONTIN 300 MG capsule Generic  drug:  gabapentin Take 300 mg by mouth daily.   pantoprazole 40 MG tablet Commonly known as:  PROTONIX Take 40 mg by mouth daily.   predniSONE 20 MG tablet Commonly known as:  DELTASONE Take 1 tablet (20 mg total) by mouth daily with breakfast.   PROAIR RESPICLICK 161 (90 Base) MCG/ACT Aepb Generic drug:  Albuterol Sulfate Inhale 2 puffs into the lungs every 6 (six) hours as needed.   albuterol (2.5 MG/3ML) 0.083% nebulizer solution Commonly known as:  PROVENTIL Take 3 mLs (2.5 mg total) by nebulization every 6 (six) hours as needed for wheezing or shortness of breath.   prochlorperazine 10 MG tablet Commonly known as:  COMPAZINE Take 1 tablet (10 mg total) by mouth every 6 (six) hours as needed (Nausea or vomiting).   ranitidine 300 MG tablet Commonly known as:  ZANTAC Take 300 mg by mouth at  bedtime.   sertraline 100 MG tablet Commonly known as:  ZOLOFT Take 100 mg by mouth daily.   SIMBRINZA 1-0.2 % Susp Generic drug:  Brinzolamide-Brimonidine   simvastatin 40 MG tablet Commonly known as:  ZOCOR Take 40 mg by mouth every other day.   tamsulosin 0.4 MG Caps capsule Commonly known as:  FLOMAX   tiotropium 18 MCG inhalation capsule Commonly known as:  SPIRIVA Place 18 mcg into inhaler and inhale daily.   triamcinolone cream 0.1 % Commonly known as:  KENALOG APPLY TO RASH/ITCH ON ARMS TWICE DAILY AS NEEDED   venetoclax 100 MG Tabs Take 400 mg by mouth daily. Take as directed.   Vitamin D3 5000 units Tabs Take by mouth daily.       Allergies: No Known Allergies  Past Medical History, Surgical history, Social history, and Family History were reviewed and updated.  Review of Systems: Review of Systems  Constitutional: Positive for malaise/fatigue.  HENT: Negative.   Eyes: Negative.   Respiratory: Negative.   Cardiovascular: Negative.   Gastrointestinal: Negative.   Genitourinary: Negative.   Musculoskeletal: Positive for back pain.  Skin: Negative.   Neurological: Negative.   Endo/Heme/Allergies: Negative.   Psychiatric/Behavioral: Negative.      Physical Exam:  oral temperature is 98.4 F (36.9 C). His blood pressure is 110/68 and his pulse is 96. His respiration is 18 and oxygen saturation is 99%.   Wt Readings from Last 3 Encounters:  03/24/18 191 lb 4 oz (86.8 kg)  02/25/18 194 lb (88 kg)  02/17/18 195 lb 12.8 oz (88.8 kg)    Physical Exam  Constitutional: He is oriented to person, place, and time.  HENT:  Head: Normocephalic and atraumatic.  Mouth/Throat: Oropharynx is clear and moist.  Eyes: Pupils are equal, round, and reactive to light. EOM are normal.  Neck: Normal range of motion.  Cardiovascular: Normal rate, regular rhythm and normal heart sounds.  Pulmonary/Chest: Effort normal and breath sounds normal.  Abdominal: Soft.  Bowel sounds are normal.  Musculoskeletal: Normal range of motion. He exhibits no edema, tenderness or deformity.  Lymphadenopathy:    He has no cervical adenopathy.  Neurological: He is alert and oriented to person, place, and time.  Skin: Skin is warm and dry. No rash noted. No erythema.  Psychiatric: He has a normal mood and affect. His behavior is normal. Judgment and thought content normal.  Vitals reviewed.    Lab Results  Component Value Date   WBC 0.3 (LL) 04/14/2018   HGB 9.2 (L) 04/14/2018   HCT 29.3 (L) 04/14/2018   MCV 103.2 (H) 04/14/2018  PLT 305 04/14/2018   Lab Results  Component Value Date   FERRITIN 2,710 (H) 03/24/2018   IRON 182 (H) 03/24/2018   TIBC 236 03/24/2018   UIBC 54 03/24/2018   IRONPCTSAT 77 03/24/2018   Lab Results  Component Value Date   RETICCTPCT 3.2 (H) 04/14/2018   RBC 2.84 (L) 04/14/2018   RBC 2.84 (L) 04/14/2018   RETICCTABS 39.4 05/30/2015   Lab Results  Component Value Date   KAPLAMBRATIO 1.11 12/17/2016   Lab Results  Component Value Date   IGGSERUM 735 12/17/2016   IGMSERUM 79 12/17/2016   Lab Results  Component Value Date   TOTALPROTELP 7.3 01/01/2014   ALBUMINELP 55.1 (L) 01/01/2014   A1GS 6.9 (H) 01/01/2014   A2GS 8.9 01/01/2014   BETS 7.9 (H) 01/01/2014   BETA2SER 6.7 (H) 01/01/2014   GAMS 14.5 01/01/2014   MSPIKE Not Observed 12/17/2016   SPEI * 01/01/2014     Chemistry      Component Value Date/Time   NA 136 04/07/2018 0955   NA 141 05/06/2017 0901   NA 140 04/16/2016 0930   K 3.9 04/07/2018 0955   K 3.8 05/06/2017 0901   K 3.9 04/16/2016 0930   CL 109 (H) 04/07/2018 0955   CL 105 05/06/2017 0901   CO2 25 04/07/2018 0955   CO2 28 05/06/2017 0901   CO2 24 04/16/2016 0930   BUN 18 04/07/2018 0955   BUN 27 (H) 05/06/2017 0901   BUN 20.5 04/16/2016 0930   CREATININE 1.30 (H) 04/07/2018 0955   CREATININE 1.4 (H) 05/06/2017 0901   CREATININE 1.2 04/16/2016 0930      Component Value Date/Time    CALCIUM 8.3 04/07/2018 0955   CALCIUM 8.8 05/06/2017 0901   CALCIUM 8.9 04/16/2016 0930   ALKPHOS 68 04/07/2018 0955   ALKPHOS 46 05/06/2017 0901   ALKPHOS 54 04/16/2016 0930   AST 21 04/07/2018 0955   AST 16 04/16/2016 0930   ALT 27 04/07/2018 0955   ALT 25 05/06/2017 0901   ALT 21 04/16/2016 0930   BILITOT 0.6 04/07/2018 0955   BILITOT 0.27 04/16/2016 0930      Impression and Plan: Anthony [name] is a very pleasant 77 yo caucasian gentleman with 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  He has a new chromosomal abnormality with the trisomy 26.  It is tough to say how things are going right now.  The fact that his platelet count is up certainly might be considered a positive finding.  We will continue with him on therapy.  I am going to give him a week break for right now.  Again we have to keep in mind his quality of life.  I am glad that his back seems to be doing a little bit better.  We will have his labs checked weekly.  I will plan to see him back in 1 week for his next cycle of treatment.  Hopefully, the venetoclax is going to provide [place] with some benefit.    Volanda Napoleon, MD 11/4/20199:01 AM

## 2018-04-17 MED FILL — VENCLEXTA 100 MG TABS: 100 | 30 days supply | Qty: 120 | Fill #1

## 2018-04-21 ENCOUNTER — Inpatient Hospital Stay: Payer: Medicare Other

## 2018-04-21 ENCOUNTER — Telehealth: Payer: Self-pay | Admitting: Hematology & Oncology

## 2018-04-21 ENCOUNTER — Telehealth: Payer: Self-pay | Admitting: *Deleted

## 2018-04-21 ENCOUNTER — Inpatient Hospital Stay (HOSPITAL_BASED_OUTPATIENT_CLINIC_OR_DEPARTMENT_OTHER): Payer: Medicare Other | Admitting: Hematology & Oncology

## 2018-04-21 ENCOUNTER — Encounter: Payer: Self-pay | Admitting: Hematology & Oncology

## 2018-04-21 VITALS — BP 113/97 | HR 105 | Resp 18

## 2018-04-21 DIAGNOSIS — D469 Myelodysplastic syndrome, unspecified: Secondary | ICD-10-CM | POA: Diagnosis not present

## 2018-04-21 DIAGNOSIS — D4621 Refractory anemia with excess of blasts 1: Secondary | ICD-10-CM | POA: Diagnosis not present

## 2018-04-21 DIAGNOSIS — D4620 Refractory anemia with excess of blasts, unspecified: Secondary | ICD-10-CM

## 2018-04-21 DIAGNOSIS — Q999 Chromosomal abnormality, unspecified: Secondary | ICD-10-CM

## 2018-04-21 DIAGNOSIS — D508 Other iron deficiency anemias: Secondary | ICD-10-CM

## 2018-04-21 DIAGNOSIS — Z79899 Other long term (current) drug therapy: Secondary | ICD-10-CM

## 2018-04-21 LAB — LACTATE DEHYDROGENASE: LDH: 192 U/L (ref 98–192)

## 2018-04-21 LAB — IRON AND TIBC
Iron: 112 ug/dL (ref 42–163)
SATURATION RATIOS: 49 % (ref 20–55)
TIBC: 229 ug/dL (ref 202–409)
UIBC: 116 ug/dL — AB (ref 117–376)

## 2018-04-21 LAB — CBC WITH DIFFERENTIAL (CANCER CENTER ONLY)
Abs Immature Granulocytes: 0.05 10*3/uL (ref 0.00–0.07)
BASOS ABS: 0 10*3/uL (ref 0.0–0.1)
BASOS PCT: 0 %
EOS ABS: 0 10*3/uL (ref 0.0–0.5)
Eosinophils Relative: 0 %
HCT: 30.7 % — ABNORMAL LOW (ref 39.0–52.0)
Hemoglobin: 9.5 g/dL — ABNORMAL LOW (ref 13.0–17.0)
Immature Granulocytes: 6 %
LYMPHS ABS: 0.2 10*3/uL — AB (ref 0.7–4.0)
Lymphocytes Relative: 26 %
MCH: 32.9 pg (ref 26.0–34.0)
MCHC: 30.9 g/dL (ref 30.0–36.0)
MCV: 106.2 fL — AB (ref 80.0–100.0)
Monocytes Absolute: 0.1 10*3/uL (ref 0.1–1.0)
Monocytes Relative: 9 %
NEUTROS PCT: 59 %
NRBC: 0 % (ref 0.0–0.2)
Neutro Abs: 0.5 10*3/uL — ABNORMAL LOW (ref 1.7–7.7)
Platelet Count: 582 10*3/uL — ABNORMAL HIGH (ref 150–400)
RBC: 2.89 MIL/uL — AB (ref 4.22–5.81)
RDW: 20.8 % — AB (ref 11.5–15.5)
Smear Review: 1
WBC: 0.9 10*3/uL — AB (ref 4.0–10.5)

## 2018-04-21 LAB — CMP (CANCER CENTER ONLY)
ALT: 17 U/L (ref 10–47)
AST: 25 U/L (ref 11–38)
Albumin: 3.4 g/dL — ABNORMAL LOW (ref 3.5–5.0)
Alkaline Phosphatase: 50 U/L (ref 26–84)
Anion gap: 7 (ref 5–15)
BUN: 20 mg/dL (ref 7–22)
CHLORIDE: 105 mmol/L (ref 98–108)
CO2: 25 mmol/L (ref 18–33)
CREATININE: 1.2 mg/dL (ref 0.60–1.20)
Calcium: 7.9 mg/dL — ABNORMAL LOW (ref 8.0–10.3)
Glucose, Bld: 109 mg/dL (ref 73–118)
Potassium: 4 mmol/L (ref 3.3–4.7)
SODIUM: 137 mmol/L (ref 128–145)
Total Bilirubin: 0.6 mg/dL (ref 0.2–1.6)
Total Protein: 6.1 g/dL — ABNORMAL LOW (ref 6.4–8.1)

## 2018-04-21 LAB — RETICULOCYTES
IMMATURE RETIC FRACT: 22.3 % — AB (ref 2.3–15.9)
RBC.: 2.89 MIL/uL — ABNORMAL LOW (ref 4.22–5.81)
RETIC COUNT ABSOLUTE: 91.3 10*3/uL (ref 19.0–186.0)
Retic Ct Pct: 3.2 % — ABNORMAL HIGH (ref 0.4–3.1)

## 2018-04-21 LAB — URIC ACID: URIC ACID, SERUM: 3.9 mg/dL (ref 3.7–8.6)

## 2018-04-21 LAB — FERRITIN: FERRITIN: 2722 ng/mL — AB (ref 24–336)

## 2018-04-21 LAB — SAMPLE TO BLOOD BANK

## 2018-04-21 MED ORDER — SODIUM CHLORIDE 0.9% FLUSH
10.0000 mL | INTRAVENOUS | Status: AC | PRN
  Administered 2018-04-21: 10 mL
  Filled 2018-04-21: qty 10

## 2018-04-21 MED ORDER — HEPARIN SOD (PORK) LOCK FLUSH 100 UNIT/ML IV SOLN
500.0000 [IU] | Freq: Once | INTRAVENOUS | Status: AC | PRN
  Administered 2018-04-21: 500 [IU]
  Filled 2018-04-21: qty 5

## 2018-04-21 NOTE — Telephone Encounter (Signed)
Dr. Marin Olp notified of WBC-0.9 and ANC-0.5.  No new orders received.

## 2018-04-21 NOTE — Patient Instructions (Signed)
Implanted Port Insertion, Care After °This sheet gives you information about how to care for yourself after your procedure. Your health care provider may also give you more specific instructions. If you have problems or questions, contact your health care provider. °What can I expect after the procedure? °After your procedure, it is common to have: °· Discomfort at the port insertion site. °· Bruising on the skin over the port. This should improve over 3-4 days. ° °Follow these instructions at home: °Port care °· After your port is placed, you will get a manufacturer's information card. The card has information about your port. Keep this card with you at all times. °· Take care of the port as told by your health care provider. Ask your health care provider if you or a family member can get training for taking care of the port at home. A home health care nurse may also take care of the port. °· Make sure to remember what type of port you have. °Incision care °· Follow instructions from your health care provider about how to take care of your port insertion site. Make sure you: °? Wash your hands with soap and water before you change your bandage (dressing). If soap and water are not available, use hand sanitizer. °? Change your dressing as told by your health care provider. °? Leave stitches (sutures), skin glue, or adhesive strips in place. These skin closures may need to stay in place for 2 weeks or longer. If adhesive strip edges start to loosen and curl up, you may trim the loose edges. Do not remove adhesive strips completely unless your health care provider tells you to do that. °· Check your port insertion site every day for signs of infection. Check for: °? More redness, swelling, or pain. °? More fluid or blood. °? Warmth. °? Pus or a bad smell. °General instructions °· Do not take baths, swim, or use a hot tub until your health care provider approves. °· Do not lift anything that is heavier than 10 lb (4.5  kg) for a week, or as told by your health care provider. °· Ask your health care provider when it is okay to: °? Return to work or school. °? Resume usual physical activities or sports. °· Do not drive for 24 hours if you were given a medicine to help you relax (sedative). °· Take over-the-counter and prescription medicines only as told by your health care provider. °· Wear a medical alert bracelet in case of an emergency. This will tell any health care providers that you have a port. °· Keep all follow-up visits as told by your health care provider. This is important. °Contact a health care provider if: °· You cannot flush your port with saline as directed, or you cannot draw blood from the port. °· You have a fever or chills. °· You have more redness, swelling, or pain around your port insertion site. °· You have more fluid or blood coming from your port insertion site. °· Your port insertion site feels warm to the touch. °· You have pus or a bad smell coming from the port insertion site. °Get help right away if: °· You have chest pain or shortness of breath. °· You have bleeding from your port that you cannot control. °Summary °· Take care of the port as told by your health care provider. °· Change your dressing as told by your health care provider. °· Keep all follow-up visits as told by your health care provider. °  This information is not intended to replace advice given to you by your health care provider. Make sure you discuss any questions you have with your health care provider. °Document Released: 03/18/2013 Document Revised: 04/18/2016 Document Reviewed: 04/18/2016 °Elsevier Interactive Patient Education © 2017 [employer] ° °

## 2018-04-21 NOTE — Progress Notes (Signed)
Hematology and Oncology Follow Up Visit  Anthony [name] [id number] 1940-07-08 77 y.o. 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  He is looking much better.  He is feeling better.  His white cell count is coming up a little bit more.  I still do not think that we can treat him right now.  His appetite is improving although his weight is down a few pounds.  His wife is worried about this.  He has had no cough.  He has had no bleeding.  He has had no diarrhea.  He has had no fever.  Again, he continues to look better each time we see him.  I think his blood counts will follow and we will be able to treat him.    Overall, I would say that his performance status is ECOG 1-2.    Medications:  Allergies as of 04/21/2018   No Known Allergies     Medication List        Accurate as of 04/21/18 10:59 AM. Always use your most recent med list.          bimatoprost 0.01 % Soln Commonly known as:  LUMIGAN Place 1 drop into both eyes at bedtime.   budesonide-formoterol 160-4.5 MCG/ACT inhaler Commonly known as:  SYMBICORT Inhale 2 puffs into the lungs 2 (two) times daily.   buPROPion 150 MG 24 hr tablet Commonly known as:  WELLBUTRIN XL Take 150 mg by mouth daily.   CITRACAL PO Take 1,000 Units by mouth 2 (two) times daily.   clopidogrel 75 MG tablet Commonly known as:  PLAVIX Take 75 mg by mouth daily.   co-enzyme Q-10 30 MG capsule Take 100 mg by mouth daily.   Cyanocobalamin 2500 MCG Tabs Take 5,000 mcg by mouth daily.   fish oil-omega-3  fatty acids 1000 MG capsule Take 1 g by mouth daily.   glucosamine-chondroitin 500-400 MG tablet Take 1 tablet by mouth daily.   HYDROcodone-acetaminophen 7.5-325 MG tablet Commonly known as:  NORCO TAKE 1 TABLET BY MOUTH AS NEEDED EVERY 6 HOURS FOR 7 DAYS   levofloxacin 500 MG tablet Commonly known as:  LEVAQUIN Take 1 tablet (500 mg total) by mouth daily.   LORazepam 0.5 MG tablet Commonly known as:  ATIVAN Take 1 tablet (0.5 mg total) by mouth every 6 (six) hours as needed (Nausea or vomiting).   magnesium oxide 400 MG tablet Commonly known as:  MAG-OX Take 400 mg by mouth daily.   Menthol (Topical Analgesic) 2.5 % Gel Apply topically.   NEURONTIN 300 MG capsule Generic drug:  gabapentin Take 300 mg by mouth daily.   pantoprazole 40 MG tablet Commonly known as:  PROTONIX Take 40 mg by mouth daily.   predniSONE 20 MG tablet Commonly known as:  DELTASONE Take 1 tablet (20 mg total) by mouth daily with breakfast.   PROAIR RESPICLICK 093 (90 Base) MCG/ACT Aepb Generic drug:  Albuterol Sulfate Inhale 2 puffs into the lungs every 6 (six) hours as needed.   albuterol (  2.5 MG/3ML) 0.083% nebulizer solution Commonly known as:  PROVENTIL Take 3 mLs (2.5 mg total) by nebulization every 6 (six) hours as needed for wheezing or shortness of breath.   prochlorperazine 10 MG tablet Commonly known as:  COMPAZINE Take 1 tablet (10 mg total) by mouth every 6 (six) hours as needed (Nausea or vomiting).   ranitidine 300 MG tablet Commonly known as:  ZANTAC Take 300 mg by mouth at bedtime.   sertraline 100 MG tablet Commonly known as:  ZOLOFT Take 100 mg by mouth daily.   SIMBRINZA 1-0.2 % Susp Generic drug:  Brinzolamide-Brimonidine   simvastatin 40 MG tablet Commonly known as:  ZOCOR Take 40 mg by mouth every other day.   tamsulosin 0.4 MG Caps capsule Commonly known as:  FLOMAX   tiotropium 18 MCG inhalation capsule Commonly known as:  SPIRIVA Place 18 mcg into  inhaler and inhale daily.   triamcinolone cream 0.1 % Commonly known as:  KENALOG APPLY TO RASH/ITCH ON ARMS TWICE DAILY AS NEEDED   venetoclax 100 MG Tabs Take 400 mg by mouth daily. Take as directed.   Vitamin D3 125 MCG (5000 UT) Tabs Take by mouth daily.       Allergies: No Known Allergies  Past Medical History, Surgical history, Social history, and Family History were reviewed and updated.  Review of Systems: Review of Systems  Constitutional: Positive for malaise/fatigue.  HENT: Negative.   Eyes: Negative.   Respiratory: Negative.   Cardiovascular: Negative.   Gastrointestinal: Negative.   Genitourinary: Negative.   Musculoskeletal: Positive for back pain.  Skin: Negative.   Neurological: Negative.   Endo/Heme/Allergies: Negative.   Psychiatric/Behavioral: Negative.      Physical Exam:  blood pressure is 113/97 (abnormal) and his pulse is 105 (abnormal). His respiration is 18 and oxygen saturation is 100%.   Wt Readings from Last 3 Encounters:  03/24/18 191 lb 4 oz (86.8 kg)  02/25/18 194 lb (88 kg)  02/17/18 195 lb 12.8 oz (88.8 kg)    Physical Exam  Constitutional: He is oriented to person, place, and time.  HENT:  Head: Normocephalic and atraumatic.  Mouth/Throat: Oropharynx is clear and moist.  Eyes: Pupils are equal, round, and reactive to light. EOM are normal.  Neck: Normal range of motion.  Cardiovascular: Normal rate, regular rhythm and normal heart sounds.  Pulmonary/Chest: Effort normal and breath sounds normal.  Abdominal: Soft. Bowel sounds are normal.  Musculoskeletal: Normal range of motion. He exhibits no edema, tenderness or deformity.  Lymphadenopathy:    He has no cervical adenopathy.  Neurological: He is alert and oriented to person, place, and time.  Skin: Skin is warm and dry. No rash noted. No erythema.  Psychiatric: He has a normal mood and affect. His behavior is normal. Judgment and thought content normal.  Vitals  reviewed.    Lab Results  Component Value Date   WBC 0.9 (LL) 04/21/2018   HGB 9.5 (L) 04/21/2018   HCT 30.7 (L) 04/21/2018   MCV 106.2 (H) 04/21/2018   PLT 582 (H) 04/21/2018   Lab Results  Component Value Date   FERRITIN 2,841 (H) 04/14/2018   IRON 102 04/14/2018   TIBC 228 04/14/2018   UIBC 125 04/14/2018   IRONPCTSAT 45 04/14/2018   Lab Results  Component Value Date   RETICCTPCT 3.2 (H) 04/21/2018   RBC 2.89 (L) 04/21/2018   RBC 2.89 (L) 04/21/2018   RETICCTABS 39.4 05/30/2015   Lab Results  Component Value Date   [facility]  1.11 12/17/2016   Lab Results  Component Value Date   IGGSERUM 735 12/17/2016   IGMSERUM 79 12/17/2016   Lab Results  Component Value Date   TOTALPROTELP 7.3 01/01/2014   ALBUMINELP 55.1 (L) 01/01/2014   A1GS 6.9 (H) 01/01/2014   A2GS 8.9 01/01/2014   BETS 7.9 (H) 01/01/2014   BETA2SER 6.7 (H) 01/01/2014   GAMS 14.5 01/01/2014   MSPIKE Not Observed 12/17/2016   SPEI * 01/01/2014     Chemistry      Component Value Date/Time   NA 137 04/21/2018 0935   NA 141 05/06/2017 0901   NA 140 04/16/2016 0930   K 4.0 04/21/2018 0935   K 3.8 05/06/2017 0901   K 3.9 04/16/2016 0930   CL 105 04/21/2018 0935   CL 105 05/06/2017 0901   CO2 25 04/21/2018 0935   CO2 28 05/06/2017 0901   CO2 24 04/16/2016 0930   BUN 20 04/21/2018 0935   BUN 27 (H) 05/06/2017 0901   BUN 20.5 04/16/2016 0930   CREATININE 1.20 04/21/2018 0935   CREATININE 1.4 (H) 05/06/2017 0901   CREATININE 1.2 04/16/2016 0930      Component Value Date/Time   CALCIUM 7.9 (L) 04/21/2018 0935   CALCIUM 8.8 05/06/2017 0901   CALCIUM 8.9 04/16/2016 0930   ALKPHOS 50 04/21/2018 0935   ALKPHOS 46 05/06/2017 0901   ALKPHOS 54 04/16/2016 0930   AST 25 04/21/2018 0935   AST 16 04/16/2016 0930   ALT 17 04/21/2018 0935   ALT 25 05/06/2017 0901   ALT 21 04/16/2016 0930   BILITOT 0.6 04/21/2018 0935   BILITOT 0.27 04/16/2016 0930      Impression and Plan: Mr. 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  He has a new chromosomal abnormality with the trisomy 27.  It is tough to say how things are going right now.  The fact that his platelet count is up certainly might be considered a positive finding.  We will continue with him on therapy.  I am going to give him another week break for right now.  Again we have to keep in mind his quality of life.  I am glad that his back seems to be doing a little bit better.  We will have his labs checked weekly.  I will plan to see him back in 1 week for his next cycle of treatment.  Hopefully, the venetoclax is going to provide [place] with some benefit.    Volanda Napoleon, MD 11/11/201910:59 AM

## 2018-04-21 NOTE — Addendum Note (Signed)
Addended by: Randolm Idol on: 04/21/2018 11:06 AM   Modules accepted: Orders

## 2018-04-21 NOTE — Telephone Encounter (Signed)
Appts scheduled patient to get updated schedule at next visit per 11/11 los

## 2018-04-22 ENCOUNTER — Inpatient Hospital Stay: Payer: Medicare Other

## 2018-04-23 ENCOUNTER — Inpatient Hospital Stay: Payer: Medicare Other

## 2018-04-24 ENCOUNTER — Inpatient Hospital Stay: Payer: Medicare Other

## 2018-04-25 ENCOUNTER — Inpatient Hospital Stay: Payer: Medicare Other

## 2018-04-28 ENCOUNTER — Inpatient Hospital Stay (HOSPITAL_BASED_OUTPATIENT_CLINIC_OR_DEPARTMENT_OTHER): Payer: Medicare Other | Admitting: Family

## 2018-04-28 ENCOUNTER — Inpatient Hospital Stay: Payer: Medicare Other

## 2018-04-28 VITALS — BP 118/49 | HR 116 | Temp 97.8°F | Resp 18 | Ht 69.0 in | Wt 190.0 lb

## 2018-04-28 DIAGNOSIS — D4620 Refractory anemia with excess of blasts, unspecified: Secondary | ICD-10-CM

## 2018-04-28 DIAGNOSIS — D469 Myelodysplastic syndrome, unspecified: Secondary | ICD-10-CM | POA: Diagnosis not present

## 2018-04-28 DIAGNOSIS — D4621 Refractory anemia with excess of blasts 1: Secondary | ICD-10-CM

## 2018-04-28 DIAGNOSIS — Q999 Chromosomal abnormality, unspecified: Secondary | ICD-10-CM | POA: Diagnosis not present

## 2018-04-28 DIAGNOSIS — D46Z Other myelodysplastic syndromes: Secondary | ICD-10-CM

## 2018-04-28 DIAGNOSIS — Z79899 Other long term (current) drug therapy: Secondary | ICD-10-CM

## 2018-04-28 DIAGNOSIS — D508 Other iron deficiency anemias: Secondary | ICD-10-CM

## 2018-04-28 LAB — CMP (CANCER CENTER ONLY)
ALT: 22 U/L (ref 10–47)
ANION GAP: 12 (ref 5–15)
AST: 28 U/L (ref 11–38)
Albumin: 3.3 g/dL — ABNORMAL LOW (ref 3.5–5.0)
Alkaline Phosphatase: 45 U/L (ref 26–84)
BILIRUBIN TOTAL: 0.5 mg/dL (ref 0.2–1.6)
BUN: 18 mg/dL (ref 7–22)
CHLORIDE: 108 mmol/L (ref 98–108)
CO2: 25 mmol/L (ref 18–33)
Calcium: 8.9 mg/dL (ref 8.0–10.3)
Creatinine: 1.3 mg/dL — ABNORMAL HIGH (ref 0.60–1.20)
Glucose, Bld: 121 mg/dL — ABNORMAL HIGH (ref 73–118)
Potassium: 4.2 mmol/L (ref 3.3–4.7)
Sodium: 145 mmol/L (ref 128–145)
Total Protein: 6.3 g/dL — ABNORMAL LOW (ref 6.4–8.1)

## 2018-04-28 LAB — CBC WITH DIFFERENTIAL (CANCER CENTER ONLY)
Band Neutrophils: 0 %
Basophils Absolute: 0 10*3/uL (ref 0.0–0.1)
Basophils Relative: 0 %
EOS PCT: 0 %
Eosinophils Absolute: 0 10*3/uL (ref 0.0–0.5)
HCT: 32.9 % — ABNORMAL LOW (ref 39.0–52.0)
Hemoglobin: 10.4 g/dL — ABNORMAL LOW (ref 13.0–17.0)
LYMPHS ABS: 0.3 10*3/uL — AB (ref 0.7–4.0)
Lymphocytes Relative: 9 %
MCH: 34.4 pg — AB (ref 26.0–34.0)
MCHC: 31.6 g/dL (ref 30.0–36.0)
MCV: 108.9 fL — AB (ref 80.0–100.0)
MONO ABS: 0.5 10*3/uL (ref 0.1–1.0)
MONOS PCT: 16 %
NEUTROS ABS: 2.2 10*3/uL (ref 1.7–7.7)
NEUTROS PCT: 72 %
PLATELETS: 336 10*3/uL (ref 150–400)
RBC: 3.02 MIL/uL — ABNORMAL LOW (ref 4.22–5.81)
RDW: 20.7 % — AB (ref 11.5–15.5)
WBC: 3.1 10*3/uL — AB (ref 4.0–10.5)
nRBC: 0 % (ref 0.0–0.2)

## 2018-04-28 LAB — SAVE SMEAR(SSMR), FOR PROVIDER SLIDE REVIEW

## 2018-04-28 LAB — RETICULOCYTES
IMMATURE RETIC FRACT: 21.7 % — AB (ref 2.3–15.9)
RBC.: 3.02 MIL/uL — ABNORMAL LOW (ref 4.22–5.81)
RETIC CT PCT: 3.4 % — AB (ref 0.4–3.1)
Retic Count, Absolute: 102.4 10*3/uL (ref 19.0–186.0)

## 2018-04-28 LAB — SAMPLE TO BLOOD BANK

## 2018-04-28 MED ORDER — PROCHLORPERAZINE MALEATE 10 MG PO TABS
10.0000 mg | ORAL_TABLET | Freq: Once | ORAL | Status: AC
  Administered 2018-04-28: 10 mg via ORAL

## 2018-04-28 MED ORDER — SODIUM CHLORIDE 0.9 % IV SOLN
20.0000 mg/m2 | Freq: Once | INTRAVENOUS | Status: AC
  Administered 2018-04-28: 45 mg via INTRAVENOUS
  Filled 2018-04-28: qty 9

## 2018-04-28 MED ORDER — PROCHLORPERAZINE MALEATE 10 MG PO TABS
ORAL_TABLET | ORAL | Status: AC
  Filled 2018-04-28: qty 1

## 2018-04-28 MED ORDER — SODIUM CHLORIDE 0.9 % IV SOLN
Freq: Once | INTRAVENOUS | Status: AC
  Administered 2018-04-28: 15:00:00 via INTRAVENOUS
  Filled 2018-04-28: qty 250

## 2018-04-28 MED ORDER — SODIUM CHLORIDE 0.9% FLUSH
10.0000 mL | INTRAVENOUS | Status: DC | PRN
  Administered 2018-04-28: 10 mL
  Filled 2018-04-28: qty 10

## 2018-04-28 MED ORDER — SODIUM CHLORIDE 0.9% FLUSH
10.0000 mL | Freq: Once | INTRAVENOUS | Status: AC
  Administered 2018-04-28: 10 mL
  Filled 2018-04-28: qty 10

## 2018-04-28 MED ORDER — HEPARIN SOD (PORK) LOCK FLUSH 100 UNIT/ML IV SOLN
500.0000 [IU] | Freq: Once | INTRAVENOUS | Status: AC | PRN
  Administered 2018-04-28: 500 [IU]
  Filled 2018-04-28: qty 5

## 2018-04-28 NOTE — Patient Instructions (Signed)
Implanted Port Home Guide An implanted port is a type of central line that is placed under the skin. Central lines are used to provide IV access when treatment or nutrition needs to be given through a person's veins. Implanted ports are used for long-term IV access. An implanted port may be placed because:  You need IV medicine that would be irritating to the small veins in your hands or arms.  You need long-term IV medicines, such as antibiotics.  You need IV nutrition for a long period.  You need frequent blood draws for lab tests.  You need dialysis.  Implanted ports are usually placed in the chest area, but they can also be placed in the upper arm, the abdomen, or the leg. An implanted port has two main parts:  Reservoir. The reservoir is round and will appear as a small, raised area under your skin. The reservoir is the part where a needle is inserted to give medicines or draw blood.  Catheter. The catheter is a thin, flexible tube that extends from the reservoir. The catheter is placed into a large vein. Medicine that is inserted into the reservoir goes into the catheter and then into the vein.  How will I care for my incision site? Do not get the incision site wet. Bathe or shower as directed by your health care provider. How is my port accessed? Special steps must be taken to access the port:  Before the port is accessed, a numbing cream can be placed on the skin. This helps numb the skin over the port site.  Your health care provider uses a sterile technique to access the port. ? Your health care provider must put on a mask and sterile gloves. ? The skin over your port is cleaned carefully with an antiseptic and allowed to dry. ? The port is gently pinched between sterile gloves, and a needle is inserted into the port.  Only "non-coring" port needles should be used to access the port. Once the port is accessed, a blood return should be checked. This helps ensure that the port  is in the vein and is not clogged.  If your port needs to remain accessed for a constant infusion, a clear (transparent) bandage will be placed over the needle site. The bandage and needle will need to be changed every week, or as directed by your health care provider.  Keep the bandage covering the needle clean and dry. Do not get it wet. Follow your health care provider's instructions on how to take a shower or bath while the port is accessed.  If your port does not need to stay accessed, no bandage is needed over the port.  What is flushing? Flushing helps keep the port from getting clogged. Follow your health care provider's instructions on how and when to flush the port. Ports are usually flushed with saline solution or a medicine called heparin. The need for flushing will depend on how the port is used.  If the port is used for intermittent medicines or blood draws, the port will need to be flushed: ? After medicines have been given. ? After blood has been drawn. ? As part of routine maintenance.  If a constant infusion is running, the port may not need to be flushed.  How long will my port stay implanted? The port can stay in for as long as your health care provider thinks it is needed. When it is time for the port to come out, surgery will be   done to remove it. The procedure is similar to the one performed when the port was put in. When should I seek immediate medical care? When you have an implanted port, you should seek immediate medical care if:  You notice a bad smell coming from the incision site.  You have swelling, redness, or drainage at the incision site.  You have more swelling or pain at the port site or the surrounding area.  You have a fever that is not controlled with medicine.  This information is not intended to replace advice given to you by your health care provider. Make sure you discuss any questions you have with your health care provider. Document  Released: 05/28/2005 Document Revised: 11/03/2015 Document Reviewed: 02/02/2013 Elsevier Interactive Patient Education  2017 [employer]  

## 2018-04-28 NOTE — Progress Notes (Signed)
Hematology and Oncology Follow Up Visit  Anthony [name] [id number] [birth year] 77 y.o. 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 His labs work has come up nicely. WBC count is now 3.1, Hgb is 10.4 and platelet count 336.  He states that he feels his energy is increasing.  He is walking around at home with his walker for support. No falls or syncopal episodes to report.  He has some SOB at time with over exertion and will take a break to rest when needed. He uses his inhaler as needed as well.  No fever, chills, cough, rash, dizziness, chest pain, palpitations, abdominal pain or changes in bowel or bladder habits.  No swelling, tenderness, numbness or tingling in his extremities at this time.  No lymphadenopathy noted on exam.  He has occasional episodes of nausea which resolves with compazine.  He has a good appetite at this time and is staying well hydrated. His weight is stable.   ECOG Performance Status: 2 - Symptomatic, <50% confined to bed  Medications:  Allergies as of 04/28/2018   No Known Allergies     Medication List        Accurate as of 04/28/18  2:36 PM. Always use your most recent med list.          bimatoprost 0.01 % Soln Commonly known as:  LUMIGAN Place 1 drop into both eyes at bedtime.   budesonide-formoterol 160-4.5 MCG/ACT inhaler Commonly known as:  SYMBICORT Inhale 2 puffs into the lungs 2 (two) times daily.   buPROPion 150 MG 24 hr tablet Commonly known as:  WELLBUTRIN XL Take 150 mg by mouth  daily.   CITRACAL PO Take 1,000 Units by mouth 2 (two) times daily.   clopidogrel 75 MG tablet Commonly known as:  PLAVIX Take 75 mg by mouth daily.   co-enzyme Q-10 30 MG capsule Take 100 mg by mouth daily.   Cyanocobalamin 2500 MCG Tabs Take 5,000 mcg by mouth daily.   fish oil-omega-3 fatty acids 1000 MG capsule Take 1 g by mouth daily.   glucosamine-chondroitin 500-400 MG tablet Take 1 tablet by mouth daily.   HYDROcodone-acetaminophen 7.5-325 MG tablet Commonly known as:  NORCO TAKE 1 TABLET BY MOUTH AS NEEDED EVERY 6 HOURS FOR 7 DAYS   levofloxacin 500 MG tablet Commonly known as:  LEVAQUIN Take 1 tablet (500 mg total) by mouth daily.   LORazepam 0.5 MG tablet Commonly known as:  ATIVAN Take 1 tablet (0.5 mg total) by mouth every 6 (six) hours as needed (Nausea or vomiting).   magnesium oxide 400 MG tablet Commonly known as:  MAG-OX Take 400 mg by mouth daily.   Menthol (Topical Analgesic) 2.5 % Gel Apply topically.   NEURONTIN 300 MG capsule Generic drug:  gabapentin Take 300 mg by mouth daily.   pantoprazole 40 MG tablet Commonly known as:  PROTONIX Take 40 mg by mouth daily.   predniSONE 20 MG tablet Commonly known as:  DELTASONE Take 1 tablet (20  mg total) by mouth daily with breakfast.   PROAIR RESPICLICK 010 (90 Base) MCG/ACT Aepb Generic drug:  Albuterol Sulfate Inhale 2 puffs into the lungs every 6 (six) hours as needed.   albuterol (2.5 MG/3ML) 0.083% nebulizer solution Commonly known as:  PROVENTIL Take 3 mLs (2.5 mg total) by nebulization every 6 (six) hours as needed for wheezing or shortness of breath.   prochlorperazine 10 MG tablet Commonly known as:  COMPAZINE Take 1 tablet (10 mg total) by mouth every 6 (six) hours as needed (Nausea or vomiting).   ranitidine 300 MG tablet Commonly known as:  ZANTAC Take 300 mg by mouth at bedtime.   sertraline 100 MG tablet Commonly known as:  ZOLOFT Take 100 mg by mouth daily.    SIMBRINZA 1-0.2 % Susp Generic drug:  Brinzolamide-Brimonidine   simvastatin 40 MG tablet Commonly known as:  ZOCOR Take 40 mg by mouth every other day.   tamsulosin 0.4 MG Caps capsule Commonly known as:  FLOMAX   tiotropium 18 MCG inhalation capsule Commonly known as:  SPIRIVA Place 18 mcg into inhaler and inhale daily.   triamcinolone cream 0.1 % Commonly known as:  KENALOG APPLY TO RASH/ITCH ON ARMS TWICE DAILY AS NEEDED   venetoclax 100 MG Tabs Take 400 mg by mouth daily. Take as directed.   Vitamin D3 125 MCG (5000 UT) Tabs Take by mouth daily.       Allergies: No Known Allergies  Past Medical History, Surgical history, Social history, and Family History were reviewed and updated.  Review of Systems: All other 10 point review of systems is negative.   Physical Exam:  height is 5\' 9"  (1.753 m) and weight is 190 lb (86.2 kg). His oral temperature is 97.8 F (36.6 C). His blood pressure is 118/49 (abnormal) and his pulse is 116 (abnormal). His respiration is 18 and oxygen saturation is 100%.   Wt Readings from Last 3 Encounters:  04/28/18 190 lb (86.2 kg)  03/24/18 191 lb 4 oz (86.8 kg)  02/25/18 194 lb (88 kg)    Ocular: Sclerae unicteric, pupils equal, round and reactive to light Ear-nose-throat: Oropharynx clear, dentition fair Lymphatic: No cervical, supraclavicular or axillary adenopathy Lungs no rales or rhonchi, good excursion bilaterally Heart regular rate and rhythm, no murmur appreciated Abd soft, nontender, positive bowel sounds, no liver or spleen tip palpated on exam, no fluid wave  MSK no focal spinal tenderness, no joint edema Neuro: non-focal, well-oriented, appropriate affect Breasts: Deferred   Lab Results  Component Value Date   WBC 3.1 (L) 04/28/2018   HGB 10.4 (L) 04/28/2018   HCT 32.9 (L) 04/28/2018   MCV 108.9 (H) 04/28/2018   PLT 336 04/28/2018   Lab Results  Component Value Date   FERRITIN 2,722 (H) 04/21/2018   IRON 112  04/21/2018   TIBC 229 04/21/2018   UIBC 116 (L) 04/21/2018   IRONPCTSAT 49 04/21/2018   Lab Results  Component Value Date   RETICCTPCT 3.4 (H) 04/28/2018   RBC 3.02 (L) 04/28/2018   RBC 3.02 (L) 04/28/2018   RETICCTABS 39.4 05/30/2015   Lab Results  Component Value Date   KAPLAMBRATIO 1.11 12/17/2016   Lab Results  Component Value Date   IGGSERUM 735 12/17/2016   IGMSERUM 79 12/17/2016   Lab Results  Component Value Date   TOTALPROTELP 7.3 01/01/2014   ALBUMINELP 55.1 (L) 01/01/2014   A1GS 6.9 (H) 01/01/2014   A2GS 8.9 01/01/2014   BETS 7.9 (H) 01/01/2014   BETA2SER  6.7 (H) 01/01/2014   GAMS 14.5 01/01/2014   MSPIKE Not Observed 12/17/2016   SPEI * 01/01/2014     Chemistry      Component Value Date/Time   NA 145 04/28/2018 1355   NA 141 05/06/2017 0901   NA 140 04/16/2016 0930   K 4.2 04/28/2018 1355   K 3.8 05/06/2017 0901   K 3.9 04/16/2016 0930   CL 108 04/28/2018 1355   CL 105 05/06/2017 0901   CO2 25 04/28/2018 1355   CO2 28 05/06/2017 0901   CO2 24 04/16/2016 0930   BUN 18 04/28/2018 1355   BUN 27 (H) 05/06/2017 0901   BUN 20.5 04/16/2016 0930   CREATININE 1.30 (H) 04/28/2018 1355   CREATININE 1.4 (H) 05/06/2017 0901   CREATININE 1.2 04/16/2016 0930      Component Value Date/Time   CALCIUM 8.9 04/28/2018 1355   CALCIUM 8.8 05/06/2017 0901   CALCIUM 8.9 04/16/2016 0930   ALKPHOS 45 04/28/2018 1355   ALKPHOS 46 05/06/2017 0901   ALKPHOS 54 04/16/2016 0930   AST 28 04/28/2018 1355   AST 16 04/16/2016 0930   ALT 22 04/28/2018 1355   ALT 25 05/06/2017 0901   ALT 21 04/16/2016 0930   BILITOT 0.5 04/28/2018 1355   BILITOT 0.27 04/16/2016 0930       Impression and Plan: Anthony [name] is a very pleasant 77 yo caucasian gentleman with progressive myelodysplasia and refractory anemia with excess blasts (RAEB-1), Trisomy 41 (AXSL1, 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  He is feeling much better this week and has counts have improved.  Hgb 10.4 so no Aranesp needed  this visit.  We will proceed with treatment today per Dr. Marin Olp.  He will have a lab only appointment next week.  We will then plan to see him in another month for follow-up and treatment.  They will contact our office with any questions or concerns. We can certainly see him sooner if need be.   Laverna Peace, NP 11/18/20192:36 PM

## 2018-04-28 NOTE — Patient Instructions (Signed)
[facility] Discharge Instructions for Patients Receiving Chemotherapy  Today you received the following chemotherapy agents Dacogen To help prevent nausea and vomiting after your treatment, we encourage you to take your nausea medication as prescribed.   If you develop nausea and vomiting that is not controlled by your nausea medication, call the clinic.   BELOW ARE SYMPTOMS THAT SHOULD BE REPORTED IMMEDIATELY:  *FEVER GREATER THAN 100.5 F  *CHILLS WITH OR WITHOUT FEVER  NAUSEA AND VOMITING THAT IS NOT CONTROLLED WITH YOUR NAUSEA MEDICATION  *UNUSUAL SHORTNESS OF BREATH  *UNUSUAL BRUISING OR BLEEDING  TENDERNESS IN MOUTH AND THROAT WITH OR WITHOUT PRESENCE OF ULCERS  *URINARY PROBLEMS  *BOWEL PROBLEMS  UNUSUAL RASH Items with * indicate a potential emergency and should be followed up as soon as possible.  Feel free to call the clinic should you have any questions or concerns. The clinic phone number is [phone].  Please show the CHEMO ALERT CARD at check-in to the Emergency Department and triage nurse.   

## 2018-04-29 ENCOUNTER — Inpatient Hospital Stay: Payer: Medicare Other

## 2018-04-29 ENCOUNTER — Other Ambulatory Visit: Payer: Self-pay

## 2018-04-29 VITALS — BP 143/69 | HR 94 | Temp 97.5°F | Resp 18

## 2018-04-29 DIAGNOSIS — D4621 Refractory anemia with excess of blasts 1: Secondary | ICD-10-CM | POA: Diagnosis not present

## 2018-04-29 DIAGNOSIS — D4620 Refractory anemia with excess of blasts, unspecified: Secondary | ICD-10-CM

## 2018-04-29 LAB — IRON AND TIBC
IRON: 105 ug/dL (ref 42–163)
Saturation Ratios: 41 % (ref 20–55)
TIBC: 253 ug/dL (ref 202–409)
UIBC: 148 ug/dL (ref 117–376)

## 2018-04-29 LAB — FERRITIN: Ferritin: 2374 ng/mL — ABNORMAL HIGH (ref 24–336)

## 2018-04-29 LAB — LACTATE DEHYDROGENASE: LDH: 215 U/L — ABNORMAL HIGH (ref 98–192)

## 2018-04-29 MED ORDER — SODIUM CHLORIDE 0.9% FLUSH
10.0000 mL | INTRAVENOUS | Status: DC | PRN
  Administered 2018-04-29: 10 mL
  Filled 2018-04-29: qty 10

## 2018-04-29 MED ORDER — PROCHLORPERAZINE MALEATE 10 MG PO TABS: 10.0000 mg | ORAL_TABLET | Freq: Once | ORAL | Status: DC

## 2018-04-29 MED ORDER — HEPARIN SOD (PORK) LOCK FLUSH 100 UNIT/ML IV SOLN
500.0000 [IU] | Freq: Once | INTRAVENOUS | Status: AC | PRN
  Administered 2018-04-29: 500 [IU]
  Filled 2018-04-29: qty 5

## 2018-04-29 MED ORDER — SODIUM CHLORIDE 0.9 % IV SOLN
20.0000 mg/m2 | Freq: Once | INTRAVENOUS | Status: AC
  Administered 2018-04-29: 45 mg via INTRAVENOUS
  Filled 2018-04-29: qty 9

## 2018-04-29 MED ORDER — SODIUM CHLORIDE 0.9 % IV SOLN
Freq: Once | INTRAVENOUS | Status: AC
  Administered 2018-04-29: 13:00:00 via INTRAVENOUS
  Filled 2018-04-29: qty 250

## 2018-04-29 NOTE — Patient Instructions (Signed)
[facility] Discharge Instructions for Patients Receiving Chemotherapy  Today you received the following chemotherapy agents Decitabine To help prevent nausea and vomiting after your treatment, we encourage you to take your nausea medication as prescribed.  If you develop nausea and vomiting that is not controlled by your nausea medication, call the clinic.   BELOW ARE SYMPTOMS THAT SHOULD BE REPORTED IMMEDIATELY:  *FEVER GREATER THAN 100.5 F  *CHILLS WITH OR WITHOUT FEVER  NAUSEA AND VOMITING THAT IS NOT CONTROLLED WITH YOUR NAUSEA MEDICATION  *UNUSUAL SHORTNESS OF BREATH  *UNUSUAL BRUISING OR BLEEDING  TENDERNESS IN MOUTH AND THROAT WITH OR WITHOUT PRESENCE OF ULCERS  *URINARY PROBLEMS  *BOWEL PROBLEMS  UNUSUAL RASH Items with * indicate a potential emergency and should be followed up as soon as possible.  Feel free to call the clinic should you have any questions or concerns. The clinic phone number is [phone].  Please show the CHEMO ALERT CARD at check-in to the Emergency Department and triage nurse.   

## 2018-04-30 ENCOUNTER — Ambulatory Visit: Payer: Medicare Other

## 2018-04-30 ENCOUNTER — Inpatient Hospital Stay: Payer: Medicare Other

## 2018-04-30 VITALS — BP 123/64 | HR 85 | Temp 97.4°F | Resp 18

## 2018-04-30 DIAGNOSIS — D4621 Refractory anemia with excess of blasts 1: Secondary | ICD-10-CM | POA: Diagnosis not present

## 2018-04-30 DIAGNOSIS — D4620 Refractory anemia with excess of blasts, unspecified: Secondary | ICD-10-CM

## 2018-04-30 MED ORDER — SODIUM CHLORIDE 0.9 % IV SOLN
20.0000 mg/m2 | Freq: Once | INTRAVENOUS | Status: AC
  Administered 2018-04-30: 45 mg via INTRAVENOUS
  Filled 2018-04-30: qty 9

## 2018-04-30 MED ORDER — SODIUM CHLORIDE 0.9 % IV SOLN
Freq: Once | INTRAVENOUS | Status: AC
  Administered 2018-04-30: 10:00:00 via INTRAVENOUS
  Filled 2018-04-30: qty 250

## 2018-04-30 MED ORDER — PROCHLORPERAZINE MALEATE 10 MG PO TABS: 10.0000 mg | ORAL_TABLET | Freq: Once | ORAL | Status: DC

## 2018-04-30 MED ORDER — HEPARIN SOD (PORK) LOCK FLUSH 100 UNIT/ML IV SOLN
500.0000 [IU] | Freq: Once | INTRAVENOUS | Status: AC | PRN
  Administered 2018-04-30: 500 [IU]
  Filled 2018-04-30: qty 5

## 2018-04-30 MED ORDER — SODIUM CHLORIDE 0.9% FLUSH
10.0000 mL | INTRAVENOUS | Status: DC | PRN
  Administered 2018-04-30: 10 mL
  Filled 2018-04-30: qty 10

## 2018-05-01 ENCOUNTER — Inpatient Hospital Stay: Payer: Medicare Other

## 2018-05-01 ENCOUNTER — Ambulatory Visit: Payer: Medicare Other

## 2018-05-01 VITALS — BP 121/57 | HR 95 | Temp 97.9°F | Resp 18

## 2018-05-01 DIAGNOSIS — D4621 Refractory anemia with excess of blasts 1: Secondary | ICD-10-CM | POA: Diagnosis not present

## 2018-05-01 DIAGNOSIS — D4620 Refractory anemia with excess of blasts, unspecified: Secondary | ICD-10-CM

## 2018-05-01 MED ORDER — HEPARIN SOD (PORK) LOCK FLUSH 100 UNIT/ML IV SOLN
500.0000 [IU] | Freq: Once | INTRAVENOUS | Status: AC | PRN
  Administered 2018-05-01: 500 [IU]
  Filled 2018-05-01: qty 5

## 2018-05-01 MED ORDER — SODIUM CHLORIDE 0.9 % IV SOLN
Freq: Once | INTRAVENOUS | Status: AC
  Administered 2018-05-01: 11:00:00 via INTRAVENOUS
  Filled 2018-05-01: qty 250

## 2018-05-01 MED ORDER — PROCHLORPERAZINE MALEATE 10 MG PO TABS: 10.0000 mg | ORAL_TABLET | Freq: Once | ORAL | Status: DC

## 2018-05-01 MED ORDER — SODIUM CHLORIDE 0.9 % IV SOLN
20.0000 mg/m2 | Freq: Once | INTRAVENOUS | Status: AC
  Administered 2018-05-01: 45 mg via INTRAVENOUS
  Filled 2018-05-01: qty 9

## 2018-05-01 MED ORDER — SODIUM CHLORIDE 0.9% FLUSH
10.0000 mL | INTRAVENOUS | Status: DC | PRN
  Administered 2018-05-01: 10 mL
  Filled 2018-05-01: qty 10

## 2018-05-01 NOTE — Patient Instructions (Signed)
Decitabine injection for infusion What is this medicine? DECITABINE (dee SYE ta been) is a chemotherapy drug. This medicine reduces the growth of cancer cells. It is used to treat adults with myelodysplastic syndromes. This medicine may be used for other purposes; ask your health care provider or pharmacist if you have questions. COMMON BRAND NAME(S): Dacogen What should I tell my health care provider before I take this medicine? They need to know if you have any of these conditions: -infection (especially a virus infection such as chickenpox, cold sores, or herpes) -kidney disease -liver disease -an unusual or allergic reaction to decitabine, other medicines, foods, dyes, or preservatives -pregnant or trying to get pregnant -breast-feeding How should I use this medicine? This medicine is for infusion into a vein. It is administered in a hospital or clinic by a doctor or health care professional. Talk to your pediatrician regarding the use of this medicine in children. Special care may be needed. Overdosage: If you think you have taken too much of this medicine contact a poison control center or emergency room at once. NOTE: This medicine is only for you. Do not share this medicine with others. What if I miss a dose? It is important not to miss your dose. Call your doctor or health care professional if you are unable to keep an appointment. What may interact with this medicine? -vaccines Talk to your doctor or health care professional before taking any of these medicines: -aspirin -acetaminophen -ibuprofen -ketoprofen -naproxen This list may not describe all possible interactions. Give your health care provider a list of all the medicines, herbs, non-prescription drugs, or dietary supplements you use. Also tell them if you smoke, drink alcohol, or use illegal drugs. Some items may interact with your medicine. What should I watch for while using this medicine? Visit your doctor for  checks on your progress. This drug may make you feel generally unwell. This is not uncommon, as chemotherapy can affect healthy cells as well as cancer cells. Report any side effects. Continue your course of treatment even though you feel ill unless your doctor tells you to stop. In some cases, you may be given additional medicines to help with side effects. Follow all directions for their use. Call your doctor or health care professional for advice if you get a fever, chills or sore throat, or other symptoms of a cold or flu. Do not treat yourself. This drug decreases your body's ability to fight infections. Try to avoid being around people who are sick. This medicine may increase your risk to bruise or bleed. Call your doctor or health care professional if you notice any unusual bleeding. Do not become pregnant while taking this medicine or for at least 1 month after stopping it. Women should inform their doctor if they wish to become pregnant or think they might be pregnant. Men should not father a child while taking this medicine and for at least 2 months after stopping it. There is a potential for serious side effects to an unborn child. Talk to your health care professional or pharmacist for more information. Do not breast-feed an infant while taking this medicine. What side effects may I notice from receiving this medicine? Side effects that you should report to your doctor or health care professional as soon as possible: -low blood counts - this medicine may decrease the number of white blood cells, red blood cells and platelets. You may be at increased risk for infections and bleeding. -signs of infection - fever or  chills, cough, sore throat, pain or difficulty passing urine -signs of decreased platelets or bleeding - bruising, pinpoint red spots on the skin, black, tarry stools, blood in the urine -signs of decreased red blood cells - unusual weakness or tiredness, fainting spells,  lightheadedness -increased blood sugar Side effects that usually do not require medical attention (report to your doctor or health care professional if they continue or are bothersome): -constipation -diarrhea -headache -loss of appetite -nausea, vomiting -skin rash, itching -stomach pain -water retention -weak or tired This list may not describe all possible side effects. Call your doctor for medical advice about side effects. You may report side effects to FDA at 1-800-FDA-1088. Where should I keep my medicine? This drug is given in a hospital or clinic and will not be stored at home. NOTE: This sheet is a summary. It may not cover all possible information. If you have questions about this medicine, talk to your doctor, pharmacist, or health care provider.  2018 [employer]vier/Gold Standard (2015-06-30 15:52:57)

## 2018-05-02 ENCOUNTER — Inpatient Hospital Stay: Payer: Medicare Other

## 2018-05-02 ENCOUNTER — Ambulatory Visit: Payer: Medicare Other

## 2018-05-02 VITALS — BP 114/55 | HR 58 | Temp 97.4°F | Resp 18

## 2018-05-02 DIAGNOSIS — D4621 Refractory anemia with excess of blasts 1: Secondary | ICD-10-CM | POA: Diagnosis not present

## 2018-05-02 DIAGNOSIS — D4620 Refractory anemia with excess of blasts, unspecified: Secondary | ICD-10-CM

## 2018-05-02 MED ORDER — SODIUM CHLORIDE 0.9 % IV SOLN
20.0000 mg/m2 | Freq: Once | INTRAVENOUS | Status: AC
  Administered 2018-05-02: 45 mg via INTRAVENOUS
  Filled 2018-05-02: qty 9

## 2018-05-02 MED ORDER — SODIUM CHLORIDE 0.9% FLUSH
10.0000 mL | INTRAVENOUS | Status: DC | PRN
  Administered 2018-05-02: 10 mL
  Filled 2018-05-02: qty 10

## 2018-05-02 MED ORDER — HEPARIN SOD (PORK) LOCK FLUSH 100 UNIT/ML IV SOLN
500.0000 [IU] | Freq: Once | INTRAVENOUS | Status: AC | PRN
  Administered 2018-05-02: 500 [IU]
  Filled 2018-05-02: qty 5

## 2018-05-02 MED ORDER — SODIUM CHLORIDE 0.9 % IV SOLN
Freq: Once | INTRAVENOUS | Status: AC
  Administered 2018-05-02: 10:00:00 via INTRAVENOUS
  Filled 2018-05-02: qty 250

## 2018-05-02 NOTE — Patient Instructions (Signed)
Decitabine injection for infusion What is this medicine? DECITABINE (dee SYE ta been) is a chemotherapy drug. This medicine reduces the growth of cancer cells. It is used to treat adults with myelodysplastic syndromes. This medicine may be used for other purposes; ask your health care provider or pharmacist if you have questions. COMMON BRAND NAME(S): Dacogen What should I tell my health care provider before I take this medicine? They need to know if you have any of these conditions: -infection (especially a virus infection such as chickenpox, cold sores, or herpes) -kidney disease -liver disease -an unusual or allergic reaction to decitabine, other medicines, foods, dyes, or preservatives -pregnant or trying to get pregnant -breast-feeding How should I use this medicine? This medicine is for infusion into a vein. It is administered in a hospital or clinic by a doctor or health care professional. Talk to your pediatrician regarding the use of this medicine in children. Special care may be needed. Overdosage: If you think you have taken too much of this medicine contact a poison control center or emergency room at once. NOTE: This medicine is only for you. Do not share this medicine with others. What if I miss a dose? It is important not to miss your dose. Call your doctor or health care professional if you are unable to keep an appointment. What may interact with this medicine? -vaccines Talk to your doctor or health care professional before taking any of these medicines: -aspirin -acetaminophen -ibuprofen -ketoprofen -naproxen This list may not describe all possible interactions. Give your health care provider a list of all the medicines, herbs, non-prescription drugs, or dietary supplements you use. Also tell them if you smoke, drink alcohol, or use illegal drugs. Some items may interact with your medicine. What should I watch for while using this medicine? Visit your doctor for  checks on your progress. This drug may make you feel generally unwell. This is not uncommon, as chemotherapy can affect healthy cells as well as cancer cells. Report any side effects. Continue your course of treatment even though you feel ill unless your doctor tells you to stop. In some cases, you may be given additional medicines to help with side effects. Follow all directions for their use. Call your doctor or health care professional for advice if you get a fever, chills or sore throat, or other symptoms of a cold or flu. Do not treat yourself. This drug decreases your body's ability to fight infections. Try to avoid being around people who are sick. This medicine may increase your risk to bruise or bleed. Call your doctor or health care professional if you notice any unusual bleeding. Do not become pregnant while taking this medicine or for at least 1 month after stopping it. Women should inform their doctor if they wish to become pregnant or think they might be pregnant. Men should not father a child while taking this medicine and for at least 2 months after stopping it. There is a potential for serious side effects to an unborn child. Talk to your health care professional or pharmacist for more information. Do not breast-feed an infant while taking this medicine. What side effects may I notice from receiving this medicine? Side effects that you should report to your doctor or health care professional as soon as possible: -low blood counts - this medicine may decrease the number of white blood cells, red blood cells and platelets. You may be at increased risk for infections and bleeding. -signs of infection - fever or  chills, cough, sore throat, pain or difficulty passing urine -signs of decreased platelets or bleeding - bruising, pinpoint red spots on the skin, black, tarry stools, blood in the urine -signs of decreased red blood cells - unusual weakness or tiredness, fainting spells,  lightheadedness -increased blood sugar Side effects that usually do not require medical attention (report to your doctor or health care professional if they continue or are bothersome): -constipation -diarrhea -headache -loss of appetite -nausea, vomiting -skin rash, itching -stomach pain -water retention -weak or tired This list may not describe all possible side effects. Call your doctor for medical advice about side effects. You may report side effects to FDA at 1-800-FDA-1088. Where should I keep my medicine? This drug is given in a hospital or clinic and will not be stored at home. NOTE: This sheet is a summary. It may not cover all possible information. If you have questions about this medicine, talk to your doctor, pharmacist, or health care provider.  2018 [employer]vier/Gold Standard (2015-06-30 15:52:57)

## 2018-05-05 ENCOUNTER — Other Ambulatory Visit: Payer: Medicare Other

## 2018-05-05 ENCOUNTER — Inpatient Hospital Stay: Payer: Medicare Other

## 2018-05-05 VITALS — BP 136/68 | HR 94 | Temp 98.1°F | Resp 18

## 2018-05-05 DIAGNOSIS — D4620 Refractory anemia with excess of blasts, unspecified: Secondary | ICD-10-CM

## 2018-05-05 DIAGNOSIS — D46Z Other myelodysplastic syndromes: Secondary | ICD-10-CM

## 2018-05-05 DIAGNOSIS — D630 Anemia in neoplastic disease: Secondary | ICD-10-CM

## 2018-05-05 DIAGNOSIS — D4621 Refractory anemia with excess of blasts 1: Secondary | ICD-10-CM | POA: Diagnosis not present

## 2018-05-05 DIAGNOSIS — D508 Other iron deficiency anemias: Secondary | ICD-10-CM

## 2018-05-05 LAB — CMP (CANCER CENTER ONLY)
ALBUMIN: 3.1 g/dL — AB (ref 3.5–5.0)
ALT: 16 U/L (ref 10–47)
AST: 23 U/L (ref 11–38)
Alkaline Phosphatase: 36 U/L (ref 26–84)
Anion gap: 3 — ABNORMAL LOW (ref 5–15)
BUN: 18 mg/dL (ref 7–22)
CO2: 25 mmol/L (ref 18–33)
CREATININE: 1.2 mg/dL (ref 0.60–1.20)
Calcium: 7.8 mg/dL — ABNORMAL LOW (ref 8.0–10.3)
Chloride: 110 mmol/L — ABNORMAL HIGH (ref 98–108)
GLUCOSE: 105 mg/dL (ref 73–118)
POTASSIUM: 3.8 mmol/L (ref 3.3–4.7)
SODIUM: 138 mmol/L (ref 128–145)
TOTAL PROTEIN: 5.8 g/dL — AB (ref 6.4–8.1)
Total Bilirubin: 0.6 mg/dL (ref 0.2–1.6)

## 2018-05-05 LAB — SAVE SMEAR(SSMR), FOR PROVIDER SLIDE REVIEW

## 2018-05-05 LAB — CBC WITH DIFFERENTIAL (CANCER CENTER ONLY)
ABS IMMATURE GRANULOCYTES: 0.08 10*3/uL — AB (ref 0.00–0.07)
BASOS ABS: 0 10*3/uL (ref 0.0–0.1)
BASOS PCT: 0 %
Eosinophils Absolute: 0 10*3/uL (ref 0.0–0.5)
Eosinophils Relative: 0 %
HCT: 27.5 % — ABNORMAL LOW (ref 39.0–52.0)
Hemoglobin: 8.5 g/dL — ABNORMAL LOW (ref 13.0–17.0)
IMMATURE GRANULOCYTES: 2 %
Lymphocytes Relative: 5 %
Lymphs Abs: 0.2 10*3/uL — ABNORMAL LOW (ref 0.7–4.0)
MCH: 34.4 pg — AB (ref 26.0–34.0)
MCHC: 30.9 g/dL (ref 30.0–36.0)
MCV: 111.3 fL — ABNORMAL HIGH (ref 80.0–100.0)
Monocytes Absolute: 0.3 10*3/uL (ref 0.1–1.0)
Monocytes Relative: 7 %
NEUTROS ABS: 3.8 10*3/uL (ref 1.7–7.7)
NEUTROS PCT: 86 %
NRBC: 0 % (ref 0.0–0.2)
PLATELETS: 181 10*3/uL (ref 150–400)
RBC: 2.47 MIL/uL — AB (ref 4.22–5.81)
RDW: 20.6 % — ABNORMAL HIGH (ref 11.5–15.5)
WBC: 4.3 10*3/uL (ref 4.0–10.5)

## 2018-05-05 LAB — SAMPLE TO BLOOD BANK

## 2018-05-05 MED ORDER — SODIUM CHLORIDE 0.9% FLUSH
10.0000 mL | INTRAVENOUS | Status: DC | PRN
  Administered 2018-05-05: 10 mL via INTRAVENOUS
  Filled 2018-05-05: qty 10

## 2018-05-05 MED ORDER — HEPARIN SOD (PORK) LOCK FLUSH 100 UNIT/ML IV SOLN
500.0000 [IU] | Freq: Once | INTRAVENOUS | Status: AC
  Administered 2018-05-05: 500 [IU] via INTRAVENOUS
  Filled 2018-05-05: qty 5

## 2018-05-05 MED FILL — predniSONE 20 MG TABS: 20 | 30 days supply | Qty: 30 | Fill #1

## 2018-05-05 NOTE — Patient Instructions (Signed)
Implanted Port Home Guide An implanted port is a type of central line that is placed under the skin. Central lines are used to provide IV access when treatment or nutrition needs to be given through a person's veins. Implanted ports are used for long-term IV access. An implanted port may be placed because:  You need IV medicine that would be irritating to the small veins in your hands or arms.  You need long-term IV medicines, such as antibiotics.  You need IV nutrition for a long period.  You need frequent blood draws for lab tests.  You need dialysis.  Implanted ports are usually placed in the chest area, but they can also be placed in the upper arm, the abdomen, or the leg. An implanted port has two main parts:  Reservoir. The reservoir is round and will appear as a small, raised area under your skin. The reservoir is the part where a needle is inserted to give medicines or draw blood.  Catheter. The catheter is a thin, flexible tube that extends from the reservoir. The catheter is placed into a large vein. Medicine that is inserted into the reservoir goes into the catheter and then into the vein.  How will I care for my incision site? Do not get the incision site wet. Bathe or shower as directed by your health care provider. How is my port accessed? Special steps must be taken to access the port:  Before the port is accessed, a numbing cream can be placed on the skin. This helps numb the skin over the port site.  Your health care provider uses a sterile technique to access the port. ? Your health care provider must put on a mask and sterile gloves. ? The skin over your port is cleaned carefully with an antiseptic and allowed to dry. ? The port is gently pinched between sterile gloves, and a needle is inserted into the port.  Only "non-coring" port needles should be used to access the port. Once the port is accessed, a blood return should be checked. This helps ensure that the port  is in the vein and is not clogged.  If your port needs to remain accessed for a constant infusion, a clear (transparent) bandage will be placed over the needle site. The bandage and needle will need to be changed every week, or as directed by your health care provider.  Keep the bandage covering the needle clean and dry. Do not get it wet. Follow your health care provider's instructions on how to take a shower or bath while the port is accessed.  If your port does not need to stay accessed, no bandage is needed over the port.  What is flushing? Flushing helps keep the port from getting clogged. Follow your health care provider's instructions on how and when to flush the port. Ports are usually flushed with saline solution or a medicine called heparin. The need for flushing will depend on how the port is used.  If the port is used for intermittent medicines or blood draws, the port will need to be flushed: ? After medicines have been given. ? After blood has been drawn. ? As part of routine maintenance.  If a constant infusion is running, the port may not need to be flushed.  How long will my port stay implanted? The port can stay in for as long as your health care provider thinks it is needed. When it is time for the port to come out, surgery will be   done to remove it. The procedure is similar to the one performed when the port was put in. When should I seek immediate medical care? When you have an implanted port, you should seek immediate medical care if:  You notice a bad smell coming from the incision site.  You have swelling, redness, or drainage at the incision site.  You have more swelling or pain at the port site or the surrounding area.  You have a fever that is not controlled with medicine.  This information is not intended to replace advice given to you by your health care provider. Make sure you discuss any questions you have with your health care provider. Document  Released: 05/28/2005 Document Revised: 11/03/2015 Document Reviewed: 02/02/2013 Elsevier Interactive Patient Education  2017 [employer]  

## 2018-05-12 MED FILL — VENCLEXTA 100 MG TABS: 100 | 30 days supply | Qty: 120 | Fill #2

## 2018-05-20 ENCOUNTER — Telehealth: Payer: Self-pay | Admitting: *Deleted

## 2018-05-20 MED ORDER — NITROGLYCERIN 0.4 MG SL SUBL
0.40 | SUBLINGUAL_TABLET | SUBLINGUAL | Status: DC
Start: ? — End: 2018-05-20

## 2018-05-20 MED ORDER — PANTOPRAZOLE SODIUM 40 MG IV SOLR
40.00 | INTRAVENOUS | Status: DC
Start: 2018-05-29 — End: 2018-05-20

## 2018-05-20 MED ORDER — MAGNESIUM HYDROXIDE 400 MG/5ML PO SUSP
15.00 | ORAL | Status: DC
Start: ? — End: 2018-05-20

## 2018-05-20 MED ORDER — GENERIC EXTERNAL MEDICATION
10.00 | Status: DC
Start: ? — End: 2018-05-20

## 2018-05-20 MED ORDER — MUPIROCIN 2 % EX OINT
TOPICAL_OINTMENT | CUTANEOUS | Status: DC
Start: 2018-05-20 — End: 2018-05-20

## 2018-05-20 MED ORDER — POTASSIUM CHLORIDE IN NACL 20-0.9 MEQ/L-% IV SOLN
75.00 | INTRAVENOUS | Status: DC
Start: ? — End: 2018-05-20

## 2018-05-20 MED ORDER — GENERIC EXTERNAL MEDICATION
650.00 | Status: DC
Start: ? — End: 2018-05-20

## 2018-05-20 MED ORDER — METHYLPREDNISOLONE SODIUM SUCC 40 MG IJ SOLR
40.00 | INTRAMUSCULAR | Status: DC
Start: 2018-05-20 — End: 2018-05-20

## 2018-05-20 MED ORDER — GENERIC EXTERNAL MEDICATION
4.00 | Status: DC
Start: ? — End: 2018-05-20

## 2018-05-20 MED ORDER — LORAZEPAM 2 MG/ML IJ SOLN
0.50 | INTRAMUSCULAR | Status: DC
Start: ? — End: 2018-05-20

## 2018-05-20 MED ORDER — SODIUM CHLORIDE 0.9 % IV SOLN
10.00 | INTRAVENOUS | Status: DC
Start: ? — End: 2018-05-20

## 2018-05-20 MED ORDER — ACETAMINOPHEN 325 MG PO TABS
650.00 | ORAL_TABLET | ORAL | Status: DC
Start: ? — End: 2018-05-20

## 2018-05-20 MED ORDER — HYDRALAZINE HCL 20 MG/ML IJ SOLN
10.00 | INTRAMUSCULAR | Status: DC
Start: ? — End: 2018-05-20

## 2018-05-20 MED ORDER — GENERIC EXTERNAL MEDICATION
5.00 | Status: DC
Start: ? — End: 2018-05-20

## 2018-05-20 MED ORDER — ALUM & MAG HYDROXIDE-SIMETH 200-200-20 MG/5ML PO SUSP
30.00 | ORAL | Status: DC
Start: ? — End: 2018-05-20

## 2018-05-20 MED ORDER — LORAZEPAM 2 MG/ML IJ SOLN
.50 | INTRAMUSCULAR | Status: DC
Start: 2018-05-20 — End: 2018-05-20

## 2018-05-20 MED ORDER — GENERIC EXTERNAL MEDICATION
Status: DC
Start: ? — End: 2018-05-20

## 2018-05-20 MED ORDER — GENERIC EXTERNAL MEDICATION
2.00 | Status: DC
Start: 2018-05-20 — End: 2018-05-20

## 2018-05-20 MED ORDER — GENERIC EXTERNAL MEDICATION
750.00 | Status: DC
Start: 2018-05-20 — End: 2018-05-20

## 2018-05-20 MED ORDER — CLONIDINE 0.1 MG/24HR TD PTWK
1.00 | MEDICATED_PATCH | TRANSDERMAL | Status: DC
Start: 2018-05-27 — End: 2018-05-20

## 2018-05-20 MED ORDER — ACETAMINOPHEN 650 MG RE SUPP
650.00 | RECTAL | Status: DC
Start: ? — End: 2018-05-20

## 2018-05-20 NOTE — Telephone Encounter (Signed)
Returned wife's phone call regarding patient. She stated,"Phillips has been admitted to [facility], in [place]. I don't think he will be doing chemotherapy next week. I told her I would give this message to Dr. Marin Olp. I advised her to keep his appointments next week if he is discharged, and let Dr. Marin Olp decide about treatment options. Wife is to call back Friday and let this office know if he has been discharged. She verbalized understanding.

## 2018-05-21 ENCOUNTER — Telehealth: Payer: Self-pay | Admitting: Hematology & Oncology

## 2018-05-21 NOTE — Telephone Encounter (Signed)
I called patient to see if I could move him infusion time on 12/17 from 9:00 to 10:00. Patients wife stated that he was currently in the hospital @ [facility] and may not be able to come in at all next week.  IB message sent to Dr Marin Olp

## 2018-05-23 ENCOUNTER — Telehealth: Payer: Self-pay | Admitting: *Deleted

## 2018-05-23 NOTE — Telephone Encounter (Signed)
Received voice mail message from wife stating,"Anthony [name] is still hospitalized at [facility]. I'm not sure when he is going to be discharged. Please cancel all appointments for next week. Thank you."

## 2018-05-26 ENCOUNTER — Ambulatory Visit: Payer: Medicare Other

## 2018-05-26 ENCOUNTER — Other Ambulatory Visit: Payer: Medicare Other

## 2018-05-26 ENCOUNTER — Ambulatory Visit: Payer: Medicare Other | Admitting: Hematology & Oncology

## 2018-05-27 ENCOUNTER — Ambulatory Visit: Payer: Medicare Other

## 2018-05-28 ENCOUNTER — Ambulatory Visit: Payer: Medicare Other

## 2018-05-28 MED ORDER — GABAPENTIN 250 MG/5ML PO SOLN
300.00 | ORAL | Status: DC
Start: 2018-05-28 — End: 2018-05-28

## 2018-05-28 MED ORDER — ENOXAPARIN SODIUM 40 MG/0.4ML ~~LOC~~ SOLN
40.00 | SUBCUTANEOUS | Status: DC
Start: 2018-05-28 — End: 2018-05-28

## 2018-05-28 MED ORDER — BIOTENE ORALBALANCE DRY MOUTH MT GEL
OROMUCOSAL | Status: DC
Start: ? — End: 2018-05-28

## 2018-05-28 MED ORDER — IPRATROPIUM-ALBUTEROL 0.5-2.5 (3) MG/3ML IN SOLN
3.00 | RESPIRATORY_TRACT | Status: DC
Start: 2018-05-28 — End: 2018-05-28

## 2018-05-28 MED ORDER — INSULIN REGULAR HUMAN 100 UNIT/ML IJ SOLN
2.00 | INTRAMUSCULAR | Status: DC
Start: 2018-05-28 — End: 2018-05-28

## 2018-05-28 MED ORDER — LORAZEPAM 0.5 MG PO TABS
0.50 | ORAL_TABLET | ORAL | Status: DC
Start: 2018-05-28 — End: 2018-05-28

## 2018-05-28 MED ORDER — CYCLOSPORINE 0.05 % OP EMUL
1.00 | OPHTHALMIC | Status: DC
Start: 2018-05-28 — End: 2018-05-28

## 2018-05-28 MED ORDER — MORPHINE SULFATE 10 MG/5ML PO SOLN
5.00 | ORAL | Status: DC
Start: ? — End: 2018-05-28

## 2018-05-28 MED ORDER — PREDNISONE 20 MG PO TABS
20.00 | ORAL_TABLET | ORAL | Status: DC
Start: 2018-05-29 — End: 2018-05-28

## 2018-05-28 MED ORDER — MORPHINE SULFATE (PF) 4 MG/ML IV SOLN
2.00 | INTRAVENOUS | Status: DC
Start: ? — End: 2018-05-28

## 2018-05-28 MED ORDER — PROMETHAZINE HCL 25 MG/ML IJ SOLN
12.50 | INTRAMUSCULAR | Status: DC
Start: ? — End: 2018-05-28

## 2018-05-28 MED ORDER — OLANZAPINE 10 MG IM SOLR
5.00 | INTRAMUSCULAR | Status: DC
Start: ? — End: 2018-05-28

## 2018-05-28 MED ORDER — DEXTROSE 10 % IV SOLN
75.00 | INTRAVENOUS | Status: DC
Start: ? — End: 2018-05-28

## 2018-05-28 MED ORDER — CLONIDINE 0.2 MG/24HR TD PTWK
1.00 | MEDICATED_PATCH | TRANSDERMAL | Status: DC
Start: 2018-05-29 — End: 2018-05-28

## 2018-05-28 MED ORDER — BUPROPION HCL ER (XL) 150 MG PO TB24
150.00 | ORAL_TABLET | ORAL | Status: DC
Start: 2018-05-29 — End: 2018-05-28

## 2018-05-28 MED ORDER — IPRATROPIUM-ALBUTEROL 0.5-2.5 (3) MG/3ML IN SOLN
1.00 | RESPIRATORY_TRACT | Status: DC
Start: ? — End: 2018-05-28

## 2018-05-28 MED ORDER — TAMSULOSIN HCL 0.4 MG PO CAPS
0.40 | ORAL_CAPSULE | ORAL | Status: DC
Start: 2018-05-29 — End: 2018-05-28

## 2018-05-28 MED ORDER — BUDESONIDE-FORMOTEROL FUMARATE 160-4.5 MCG/ACT IN AERO
2.00 | INHALATION_SPRAY | RESPIRATORY_TRACT | Status: DC
Start: 2018-05-28 — End: 2018-05-28

## 2018-05-28 MED ORDER — LATANOPROST 0.005 % OP SOLN
1.00 | OPHTHALMIC | Status: DC
Start: 2018-05-28 — End: 2018-05-28

## 2018-05-28 MED ORDER — GENERIC EXTERNAL MEDICATION
1.00 | Status: DC
Start: 2018-05-28 — End: 2018-05-28

## 2018-05-28 MED ORDER — FLUTICASONE PROPIONATE 50 MCG/ACT NA SUSP
1.00 | NASAL | Status: DC
Start: ? — End: 2018-05-28

## 2018-05-28 MED ORDER — METOPROLOL TARTRATE 5 MG/5ML IV SOLN
5.00 | INTRAVENOUS | Status: DC
Start: ? — End: 2018-05-28

## 2018-05-28 MED ORDER — SODIUM CHLORIDE 0.9 % IV SOLN
10.00 | INTRAVENOUS | Status: DC
Start: ? — End: 2018-05-28

## 2018-05-28 MED ORDER — ATORVASTATIN CALCIUM 20 MG PO TABS
20.00 | ORAL_TABLET | ORAL | Status: DC
Start: 2018-05-30 — End: 2018-05-28

## 2018-05-29 ENCOUNTER — Ambulatory Visit: Payer: Medicare Other

## 2018-05-30 ENCOUNTER — Ambulatory Visit: Payer: Medicare Other

## 2018-06-24 ENCOUNTER — Telehealth: Payer: Self-pay | Admitting: Hematology & Oncology

## 2018-06-24 NOTE — Telephone Encounter (Signed)
Patients wife called and wanted to know if he could start treatments the week of Jan. 27, 2020.  I sent in basket message to Dr Marin Olp & Charlsie Merles to advise.

## 2018-06-25 ENCOUNTER — Telehealth: Payer: Self-pay | Admitting: Hematology & Oncology

## 2018-06-25 NOTE — Telephone Encounter (Signed)
Appointments scheduled and patient notified of date/time per 1/15 staff message

## 2018-07-03 ENCOUNTER — Other Ambulatory Visit: Payer: Self-pay | Admitting: Hematology & Oncology

## 2018-07-03 DIAGNOSIS — D4620 Refractory anemia with excess of blasts, unspecified: Secondary | ICD-10-CM

## 2018-07-03 DIAGNOSIS — F419 Anxiety disorder, unspecified: Secondary | ICD-10-CM

## 2018-07-03 DIAGNOSIS — R110 Nausea: Secondary | ICD-10-CM

## 2018-07-03 MED FILL — predniSONE 20 MG TABS: 20 | 30 days supply | Qty: 30 | Fill #0

## 2018-07-03 MED FILL — PROCHLORPERAZINE 10 MG TAB: 10 | 15 days supply | Qty: 60 | Fill #0

## 2018-07-07 ENCOUNTER — Inpatient Hospital Stay: Payer: Medicare Other

## 2018-07-07 ENCOUNTER — Other Ambulatory Visit: Payer: Self-pay

## 2018-07-07 ENCOUNTER — Inpatient Hospital Stay: Payer: Medicare Other | Attending: Family

## 2018-07-07 ENCOUNTER — Encounter: Payer: Self-pay | Admitting: Hematology & Oncology

## 2018-07-07 ENCOUNTER — Inpatient Hospital Stay (HOSPITAL_BASED_OUTPATIENT_CLINIC_OR_DEPARTMENT_OTHER): Payer: Medicare Other | Admitting: Hematology & Oncology

## 2018-07-07 VITALS — BP 142/71 | HR 105 | Temp 97.8°F | Resp 19 | Wt 178.2 lb

## 2018-07-07 DIAGNOSIS — D4620 Refractory anemia with excess of blasts, unspecified: Secondary | ICD-10-CM

## 2018-07-07 DIAGNOSIS — D508 Other iron deficiency anemias: Secondary | ICD-10-CM

## 2018-07-07 DIAGNOSIS — Z79899 Other long term (current) drug therapy: Secondary | ICD-10-CM | POA: Insufficient documentation

## 2018-07-07 DIAGNOSIS — D4621 Refractory anemia with excess of blasts 1: Secondary | ICD-10-CM | POA: Insufficient documentation

## 2018-07-07 DIAGNOSIS — D46Z Other myelodysplastic syndromes: Secondary | ICD-10-CM

## 2018-07-07 LAB — SAMPLE TO BLOOD BANK

## 2018-07-07 LAB — CBC WITH DIFFERENTIAL (CANCER CENTER ONLY)
Abs Immature Granulocytes: 0.17 10*3/uL — ABNORMAL HIGH (ref 0.00–0.07)
BASOS ABS: 0 10*3/uL (ref 0.0–0.1)
Basophils Relative: 0 %
EOS ABS: 0.1 10*3/uL (ref 0.0–0.5)
Eosinophils Relative: 1 %
HEMATOCRIT: 28.6 % — AB (ref 39.0–52.0)
Hemoglobin: 9 g/dL — ABNORMAL LOW (ref 13.0–17.0)
Immature Granulocytes: 2 %
Lymphocytes Relative: 6 %
Lymphs Abs: 0.5 10*3/uL — ABNORMAL LOW (ref 0.7–4.0)
MCH: 33.1 pg (ref 26.0–34.0)
MCHC: 31.5 g/dL (ref 30.0–36.0)
MCV: 105.1 fL — ABNORMAL HIGH (ref 80.0–100.0)
Monocytes Absolute: 0.7 10*3/uL (ref 0.1–1.0)
Monocytes Relative: 8 %
Neutro Abs: 6.9 10*3/uL (ref 1.7–7.7)
Neutrophils Relative %: 83 %
Platelet Count: 161 10*3/uL (ref 150–400)
RBC: 2.72 MIL/uL — ABNORMAL LOW (ref 4.22–5.81)
RDW: 19.5 % — AB (ref 11.5–15.5)
WBC Count: 8.3 10*3/uL (ref 4.0–10.5)
nRBC: 0 % (ref 0.0–0.2)

## 2018-07-07 LAB — RETICULOCYTES
Immature Retic Fract: 15 % (ref 2.3–15.9)
RBC.: 2.72 MIL/uL — ABNORMAL LOW (ref 4.22–5.81)
RETIC CT PCT: 2.3 % (ref 0.4–3.1)
Retic Count, Absolute: 61.7 10*3/uL (ref 19.0–186.0)

## 2018-07-07 LAB — CMP (CANCER CENTER ONLY)
ALT: 9 U/L (ref 0–44)
AST: 13 U/L — ABNORMAL LOW (ref 15–41)
Albumin: 3.6 g/dL (ref 3.5–5.0)
Alkaline Phosphatase: 45 U/L (ref 38–126)
Anion gap: 6 (ref 5–15)
BUN: 22 mg/dL (ref 8–23)
CO2: 26 mmol/L (ref 22–32)
Calcium: 8.6 mg/dL — ABNORMAL LOW (ref 8.9–10.3)
Chloride: 103 mmol/L (ref 98–111)
Creatinine: 1.3 mg/dL — ABNORMAL HIGH (ref 0.61–1.24)
GFR, Est AFR Am: 60 mL/min — ABNORMAL LOW (ref >60)
GFR, Estimated: 52 mL/min — ABNORMAL LOW (ref >60)
Glucose, Bld: 120 mg/dL — ABNORMAL HIGH (ref 70–99)
Potassium: 4.2 mmol/L (ref 3.5–5.1)
Sodium: 135 mmol/L (ref 135–145)
TOTAL PROTEIN: 5.9 g/dL — AB (ref 6.5–8.1)
Total Bilirubin: 0.2 mg/dL — ABNORMAL LOW (ref 0.3–1.2)

## 2018-07-07 LAB — SAVE SMEAR(SSMR), FOR PROVIDER SLIDE REVIEW

## 2018-07-07 LAB — LACTATE DEHYDROGENASE: LDH: 235 U/L — AB (ref 98–192)

## 2018-07-07 MED ORDER — HEPARIN SOD (PORK) LOCK FLUSH 100 UNIT/ML IV SOLN
500.0000 [IU] | Freq: Once | INTRAVENOUS | Status: AC
  Administered 2018-07-07: 500 [IU] via INTRAVENOUS
  Filled 2018-07-07: qty 5

## 2018-07-07 MED ORDER — SODIUM CHLORIDE 0.9% FLUSH
10.0000 mL | INTRAVENOUS | Status: AC | PRN
  Administered 2018-07-07: 10 mL via INTRAVENOUS
  Filled 2018-07-07: qty 10

## 2018-07-07 NOTE — Addendum Note (Signed)
Addended by: Perlie Gold on: 07/07/2018 12:20 PM   Modules accepted: Orders, SmartSet

## 2018-07-07 NOTE — Progress Notes (Signed)
Hematology and Oncology Follow Up Visit  Anthony [name] [id number] 06-15-1940 78 y.o. 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 is here today with his wife for follow-up.  We have not seen him for quite a while.  He really has had a lot of issues.  He was hospitalized over in [place].  He was told that there was nothing that could be done for him with respect to his blood problem.  He comes in a wheelchair.  Hopefully, he will get his strength back so that he will be able to walk in.  In essence, he really has not had any treatment with respect to decitabine or venetoclax for 2 months.  The last dose of decitabine was on 05/02/2018.   I am just very impressed with the fact that he is looking so much better.  He was admitted over to the hospital in [place] back on December 8.  He seemed to be having issues with COPD exacerbations.  From my perspective, we really need to get another bone marrow test on him.  I would really like to see what his bone marrow looks like right now.  When I look at his blood under the microscope.  I do not see any leukemic cells.  He has no nucleated red blood cells.  I see no hypersegmented polys.  His blood smear really looks pretty good to me.  He has had no fever.  He has had no bleeding.  He has had no issues with bowels or bladder.  He has had no leg swelling.  Overall, I would say his performance status is ECOG 2.      Medications:  Allergies as of 07/07/2018   No Known Allergies     Medication List       Accurate as of July 07, 2018 12:02 PM.  Always use your most recent med list.        bimatoprost 0.01 % Soln Commonly known as:  LUMIGAN Place 1 drop into both eyes at bedtime.   budesonide-formoterol 160-4.5 MCG/ACT inhaler Commonly known as:  SYMBICORT Inhale 2 puffs into the lungs 2 (two) times daily.   buPROPion 150 MG 24 hr tablet Commonly known as:  WELLBUTRIN XL Take 150 mg by mouth daily.   CITRACAL PO Take 1,000 Units by mouth 2 (two) times daily.   clopidogrel 75 MG tablet Commonly known as:  PLAVIX Take 75 mg by mouth daily.   co-enzyme Q-10 30 MG capsule Take 100 mg by mouth daily.   Cyanocobalamin 2500 MCG Tabs Take 5,000 mcg by mouth daily.   fish oil-omega-3 fatty acids 1000 MG capsule Take 1 g by mouth daily.   glucosamine-chondroitin 500-400 MG tablet Take 1 tablet by mouth daily.   HYDROcodone-acetaminophen 7.5-325 MG tablet Commonly known as:  NORCO TAKE 1 TABLET BY MOUTH AS NEEDED EVERY 6 HOURS FOR 7 DAYS   levofloxacin 500 MG tablet Commonly known as:  LEVAQUIN Take 1 tablet (500 mg total) by mouth daily.   LORazepam 0.5 MG tablet Commonly known as:  ATIVAN TAKE 1 TABLET BY MOUTH EVERY 6 HOURS AS NEEDED FOR NAUSEA OR VOMITING   magnesium oxide 400 MG tablet Commonly known as:  MAG-OX Take 400 mg by mouth daily.   Menthol (Topical Analgesic) 2.5 % Gel Apply topically.   NEURONTIN 300 MG capsule Generic drug:  gabapentin Take 300 mg by mouth daily.   pantoprazole 40 MG tablet Commonly known as:  PROTONIX Take 40 mg by mouth daily.   predniSONE 20 MG tablet Commonly known as:  DELTASONE Take 1 tablet (20 mg total) by mouth daily with breakfast.   PROAIR RESPICLICK 570 (90 Base) MCG/ACT Aepb Generic drug:  Albuterol Sulfate Inhale 2 puffs into the lungs every 6 (six) hours as needed.   albuterol (2.5 MG/3ML) 0.083% nebulizer solution Commonly known as:  PROVENTIL Take 3 mLs (2.5 mg total) by nebulization every 6 (six) hours as needed for wheezing or shortness of  breath.   prochlorperazine 10 MG tablet Commonly known as:  COMPAZINE Take 1 tablet (10 mg total) by mouth every 6 (six) hours as needed (Nausea or vomiting).   ranitidine 300 MG tablet Commonly known as:  ZANTAC Take 300 mg by mouth at bedtime.   sertraline 100 MG tablet Commonly known as:  ZOLOFT Take 100 mg by mouth daily.   SIMBRINZA 1-0.2 % Susp Generic drug:  Brinzolamide-Brimonidine   simvastatin 40 MG tablet Commonly known as:  ZOCOR Take 40 mg by mouth every other day.   tamsulosin 0.4 MG Caps capsule Commonly known as:  FLOMAX   tiotropium 18 MCG inhalation capsule Commonly known as:  SPIRIVA Place 18 mcg into inhaler and inhale daily.   triamcinolone cream 0.1 % Commonly known as:  KENALOG APPLY TO RASH/ITCH ON ARMS TWICE DAILY AS NEEDED   venetoclax 100 MG Tabs Take 400 mg by mouth daily. Take as directed.   Vitamin D3 125 MCG (5000 UT) Tabs Take by mouth daily.       Allergies: No Known Allergies  Past Medical History, Surgical history, Social history, and Family History were reviewed and updated.  Review of Systems: Review of Systems  Constitutional: Positive for malaise/fatigue.  HENT: Negative.   Eyes: Negative.   Respiratory: Negative.   Cardiovascular: Negative.   Gastrointestinal: Negative.   Genitourinary: Negative.   Musculoskeletal: Positive for back pain.  Skin: Negative.   Neurological: Negative.   Endo/Heme/Allergies: Negative.   Psychiatric/Behavioral: Negative.      Physical Exam:  weight is 178 lb 4 oz (80.9 kg). His oral temperature is 97.8 F (36.6 C). His blood pressure is 142/71 (abnormal) and his pulse is 105 (abnormal). His respiration is 19 and oxygen saturation is 98%.   Wt Readings from Last 3 Encounters:  07/07/18 178 lb 4 oz (80.9 kg)  04/28/18 190 lb (86.2 kg)  03/24/18 191 lb 4 oz (86.8 kg)    Physical Exam Vitals signs reviewed.  HENT:     Head: Normocephalic and atraumatic.  Eyes:     Pupils:  Pupils are equal, round, and reactive to light.  Neck:     Musculoskeletal: Normal range of motion.  Cardiovascular:     Rate and Rhythm: Normal rate and regular rhythm.     Heart sounds: Normal heart sounds.  Pulmonary:     Effort: Pulmonary effort is normal.     Breath sounds: Normal breath sounds.  Abdominal:     General: Bowel sounds are normal.     Palpations: Abdomen is soft.  Musculoskeletal: Normal range of motion.  General: No tenderness or deformity.  Lymphadenopathy:     Cervical: No cervical adenopathy.  Skin:    General: Skin is warm and dry.     Findings: No erythema or rash.  Neurological:     Mental Status: He is alert and oriented to person, place, and time.  Psychiatric:        Behavior: Behavior normal.        Thought Content: Thought content normal.        Judgment: Judgment normal.      Lab Results  Component Value Date   WBC 8.3 07/07/2018   HGB 9.0 (L) 07/07/2018   HCT 28.6 (L) 07/07/2018   MCV 105.1 (H) 07/07/2018   PLT 161 07/07/2018   Lab Results  Component Value Date   FERRITIN 2,374 (H) 04/28/2018   IRON 105 04/28/2018   TIBC 253 04/28/2018   UIBC 148 04/28/2018   IRONPCTSAT 41 04/28/2018   Lab Results  Component Value Date   RETICCTPCT 2.3 07/07/2018   RBC 2.72 (L) 07/07/2018   RBC 2.72 (L) 07/07/2018   RETICCTABS 39.4 05/30/2015   Lab Results  Component Value Date   KAPLAMBRATIO 1.11 12/17/2016   Lab Results  Component Value Date   IGGSERUM 735 12/17/2016   IGMSERUM 79 12/17/2016   Lab Results  Component Value Date   TOTALPROTELP 7.3 01/01/2014   ALBUMINELP 55.1 (L) 01/01/2014   A1GS 6.9 (H) 01/01/2014   A2GS 8.9 01/01/2014   BETS 7.9 (H) 01/01/2014   BETA2SER 6.7 (H) 01/01/2014   GAMS 14.5 01/01/2014   MSPIKE Not Observed 12/17/2016   SPEI * 01/01/2014     Chemistry      Component Value Date/Time   NA 135 07/07/2018 1100   NA 141 05/06/2017 0901   NA 140 04/16/2016 0930   K 4.2 07/07/2018 1100   K 3.8  05/06/2017 0901   K 3.9 04/16/2016 0930   CL 103 07/07/2018 1100   CL 105 05/06/2017 0901   CO2 26 07/07/2018 1100   CO2 28 05/06/2017 0901   CO2 24 04/16/2016 0930   BUN 22 07/07/2018 1100   BUN 27 (H) 05/06/2017 0901   BUN 20.5 04/16/2016 0930   CREATININE 1.30 (H) 07/07/2018 1100   CREATININE 1.4 (H) 05/06/2017 0901   CREATININE 1.2 04/16/2016 0930      Component Value Date/Time   CALCIUM 8.6 (L) 07/07/2018 1100   CALCIUM 8.8 05/06/2017 0901   CALCIUM 8.9 04/16/2016 0930   ALKPHOS 45 07/07/2018 1100   ALKPHOS 46 05/06/2017 0901   ALKPHOS 54 04/16/2016 0930   AST 13 (L) 07/07/2018 1100   AST 16 04/16/2016 0930   ALT 9 07/07/2018 1100   ALT 25 05/06/2017 0901   ALT 21 04/16/2016 0930   BILITOT 0.2 (L) 07/07/2018 1100   BILITOT 0.27 04/16/2016 0930      Impression and Plan: Mr. 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   Given that his blood counts actually look pretty good and he is doing little bit better, I really do not see a need to get him on to treatment right now.  Of note, we do have the clinical trial result that looked at luspatercept for myelodysplasia.  I am not sure if Mr. Tetrick' myelodysplasia classifies as low risk disease.  This is why a bone marrow test will be important.  He is agreeable to the bone marrow biopsy.  We will see about getting this in a week or so.  I would like to see him back in about 3 weeks.  Hopefully, we will have his results back, including the cytogenetics.  It is just so nice to see Mr. Adrian again.  We have always had a good fellowship with him.     Volanda Napoleon, MD 1/27/202012:02 PM

## 2018-07-08 ENCOUNTER — Ambulatory Visit: Payer: Medicare Other

## 2018-07-08 LAB — IRON AND TIBC
Iron: 64 ug/dL (ref 42–163)
Saturation Ratios: 28 % (ref 20–55)
TIBC: 231 ug/dL (ref 202–409)
UIBC: 167 ug/dL (ref 117–376)

## 2018-07-08 LAB — FERRITIN: FERRITIN: 2328 ng/mL — AB (ref 24–336)

## 2018-07-08 MED FILL — LORazepam 0.5 MG TABS: 0.5 | 23 days supply | Qty: 90 | Fill #0

## 2018-07-09 ENCOUNTER — Ambulatory Visit: Payer: Medicare Other

## 2018-07-10 ENCOUNTER — Ambulatory Visit: Payer: Medicare Other

## 2018-07-11 ENCOUNTER — Ambulatory Visit: Payer: Medicare Other

## 2018-07-14 ENCOUNTER — Other Ambulatory Visit: Payer: Self-pay | Admitting: Physician Assistant

## 2018-07-15 ENCOUNTER — Ambulatory Visit (HOSPITAL_COMMUNITY)
Admission: RE | Admit: 2018-07-15 | Discharge: 2018-07-15 | Disposition: A | Discharge status: Home / Self Care | Payer: Medicare Other | Source: Ambulatory Visit | Attending: Hematology & Oncology | Admitting: Hematology & Oncology

## 2018-07-15 ENCOUNTER — Encounter (HOSPITAL_COMMUNITY): Payer: Self-pay

## 2018-07-15 ENCOUNTER — Other Ambulatory Visit: Payer: Self-pay

## 2018-07-15 DIAGNOSIS — D4620 Refractory anemia with excess of blasts, unspecified: Secondary | ICD-10-CM | POA: Diagnosis present

## 2018-07-15 DIAGNOSIS — E785 Hyperlipidemia, unspecified: Secondary | ICD-10-CM | POA: Diagnosis not present

## 2018-07-15 DIAGNOSIS — Z8249 Family history of ischemic heart disease and other diseases of the circulatory system: Secondary | ICD-10-CM | POA: Insufficient documentation

## 2018-07-15 DIAGNOSIS — I2510 Atherosclerotic heart disease of native coronary artery without angina pectoris: Secondary | ICD-10-CM | POA: Insufficient documentation

## 2018-07-15 DIAGNOSIS — I129 Hypertensive chronic kidney disease with stage 1 through stage 4 chronic kidney disease, or unspecified chronic kidney disease: Secondary | ICD-10-CM | POA: Insufficient documentation

## 2018-07-15 DIAGNOSIS — N189 Chronic kidney disease, unspecified: Secondary | ICD-10-CM | POA: Insufficient documentation

## 2018-07-15 DIAGNOSIS — Z87891 Personal history of nicotine dependence: Secondary | ICD-10-CM | POA: Insufficient documentation

## 2018-07-15 DIAGNOSIS — D469 Myelodysplastic syndrome, unspecified: Secondary | ICD-10-CM | POA: Insufficient documentation

## 2018-07-15 DIAGNOSIS — Z79899 Other long term (current) drug therapy: Secondary | ICD-10-CM | POA: Diagnosis not present

## 2018-07-15 DIAGNOSIS — D539 Nutritional anemia, unspecified: Secondary | ICD-10-CM | POA: Insufficient documentation

## 2018-07-15 LAB — CBC
HCT: 29 % — ABNORMAL LOW (ref 39.0–52.0)
Hemoglobin: 8.7 g/dL — ABNORMAL LOW (ref 13.0–17.0)
MCH: 32.3 pg (ref 26.0–34.0)
MCHC: 30 g/dL (ref 30.0–36.0)
MCV: 107.8 fL — ABNORMAL HIGH (ref 80.0–100.0)
Platelets: 226 10*3/uL (ref 150–400)
RBC: 2.69 MIL/uL — ABNORMAL LOW (ref 4.22–5.81)
RDW: 19.6 % — ABNORMAL HIGH (ref 11.5–15.5)
WBC: 6.3 10*3/uL (ref 4.0–10.5)
nRBC: 0 % (ref 0.0–0.2)

## 2018-07-15 LAB — PROTIME-INR
INR: 0.93
Prothrombin Time: 12.4 seconds (ref 11.4–15.2)

## 2018-07-15 MED ORDER — LIDOCAINE HCL (PF) 1 % IJ SOLN
INTRAMUSCULAR | Status: AC | PRN
  Administered 2018-07-15: 10 mL

## 2018-07-15 MED ORDER — HEPARIN SOD (PORK) LOCK FLUSH 100 UNIT/ML IV SOLN
INTRAVENOUS | Status: AC
  Filled 2018-07-15: qty 5

## 2018-07-15 MED ORDER — MIDAZOLAM HCL 2 MG/2ML IJ SOLN
INTRAMUSCULAR | Status: AC
  Filled 2018-07-15: qty 4

## 2018-07-15 MED ORDER — SODIUM CHLORIDE 0.9 % IV SOLN
INTRAVENOUS | Status: DC
  Administered 2018-07-15: 09:00:00 via INTRAVENOUS

## 2018-07-15 MED ORDER — FENTANYL CITRATE (PF) 100 MCG/2ML IJ SOLN
INTRAMUSCULAR | Status: AC | PRN
  Administered 2018-07-15: 50 ug via INTRAVENOUS

## 2018-07-15 MED ORDER — HEPARIN SOD (PORK) LOCK FLUSH 100 UNIT/ML IV SOLN
500.0000 [IU] | INTRAVENOUS | Status: AC | PRN
  Administered 2018-07-15: 500 [IU]

## 2018-07-15 MED ORDER — FENTANYL CITRATE (PF) 100 MCG/2ML IJ SOLN
INTRAMUSCULAR | Status: AC
  Filled 2018-07-15: qty 2

## 2018-07-15 MED ORDER — MIDAZOLAM HCL 2 MG/2ML IJ SOLN
INTRAMUSCULAR | Status: AC | PRN
  Administered 2018-07-15 (×2): 1 mg via INTRAVENOUS

## 2018-07-15 NOTE — H&P (Signed)
Chief Complaint: Patient was seen in consultation today for bone marrow biopsy at the request of Ennever,Peter R  Referring Physician(s): Ennever,Peter R  Supervising Physician: Marybelle Killings  Patient Status: [facility] - Out-pt  History of Present Illness: Anthony [name] is a 78 y.o. male with myelodysplastic syndrome. He is followed by Dr. Marin Olp and if referred for bone marrow biopsy. He previously had BM bx in 02/2018 and tolerated procedure well. PMHx, meds, labs, imaging, allergies reviewed. He's on Plavix for chronic CAD. Feels well, no recent fevers, chills, illness. Has been NPO today as directed. Family at bedside.   Past Medical History:  Diagnosis Date  . Chronic kidney disease 2015  . Coronary atherosclerosis of native coronary artery   . Depressive disorder   . HTN (hypertension)   . Hyperlipidemia   . Iron deficiency anemia, unspecified 01/15/2014  . LPRD (laryngopharyngeal reflux disease)   . Myelodysplasia, low grade ([place]) 01/15/2014    Past Surgical History:  Procedure Laterality Date  . ANGIOPLASTY    . BONE MARROW BIOPSY  2016  . COLONOSCOPY    . EYE SURGERY     cataract  . implanted port Right 04/2015  . TONSILLECTOMY     as a child    Allergies: Patient has no known allergies.  Medications: Prior to Admission medications   Medication Sig Start Date End Date Taking? Authorizing Provider  albuterol (PROVENTIL) (2.5 MG/3ML) 0.083% nebulizer solution Take 3 mLs (2.5 mg total) by nebulization every 6 (six) hours as needed for wheezing or shortness of breath. 02/12/18  Yes Juanito Doom, MD  Albuterol Sulfate (PROAIR RESPICLICK) 258 (90 BASE) MCG/ACT AEPB Inhale 2 puffs into the lungs every 6 (six) hours as needed.   Yes [provider]  bimatoprost (LUMIGAN) 0.01 % SOLN Place 1 drop into both eyes at bedtime.   Yes [provider]  budesonide-formoterol (SYMBICORT) 160-4.5 MCG/ACT inhaler Inhale 2 puffs into the lungs 2 (two)  times daily. 11/12/14  Yes Clance, Armando Reichert, MD  buPROPion (WELLBUTRIN XL) 150 MG 24 hr tablet Take 150 mg by mouth daily.   Yes [provider]  Calcium Citrate (CITRACAL PO) Take 1,000 Units by mouth 2 (two) times daily.    Yes [provider]  Cholecalciferol (VITAMIN D3) 5000 UNITS TABS Take by mouth daily.   Yes [provider]  clopidogrel (PLAVIX) 75 MG tablet Take 75 mg by mouth daily.  04/16/12  Yes [provider]  co-enzyme Q-10 30 MG capsule Take 100 mg by mouth daily.   Yes [provider]  Cyanocobalamin (VITAMIN B-12) 2500 MCG TABS Take 5,000 mcg by mouth daily. 06/16/14  Yes Volanda Napoleon, MD  fish oil-omega-3 fatty acids 1000 MG capsule Take 1 g by mouth daily.   Yes [provider]  gabapentin (NEURONTIN) 300 MG capsule Take 300 mg by mouth daily. 12/16/17  Yes [provider]  glucosamine-chondroitin 500-400 MG tablet Take 1 tablet by mouth daily.   Yes [provider]  levofloxacin (LEVAQUIN) 500 MG tablet Take 1 tablet (500 mg total) by mouth daily. 04/07/18  Yes Ennever, Rudell Cobb, MD  LORazepam (ATIVAN) 0.5 MG tablet TAKE 1 TABLET BY MOUTH EVERY 6 HOURS AS NEEDED FOR NAUSEA OR VOMITING 07/03/18  Yes Ennever, Rudell Cobb, MD  magnesium oxide (MAG-OX) 400 MG tablet Take 400 mg by mouth daily.   Yes [provider]  pantoprazole (PROTONIX) 40 MG tablet Take 40 mg by mouth daily. 04/20/15  Yes  [provider]  predniSONE (DELTASONE) 20 MG tablet Take 1 tablet (20 mg total) by mouth daily with breakfast. 03/25/18  Yes Ennever, Rudell Cobb, MD  ranitidine (ZANTAC) 300 MG tablet Take 300 mg by mouth at bedtime.  05/26/12  Yes [provider]  sertraline (ZOLOFT) 100 MG tablet Take 100 mg by mouth daily. 11/16/14  Yes [provider]  simvastatin (ZOCOR) 40 MG tablet Take 40 mg by mouth every other day.  03/25/12  Yes [provider]  tamsulosin (FLOMAX) 0.4 MG CAPS capsule  12/25/16  Yes  [provider]  tiotropium (SPIRIVA) 18 MCG inhalation capsule Place 18 mcg into inhaler and inhale daily.   Yes [provider]  HYDROcodone-acetaminophen (NORCO) 7.5-325 MG tablet TAKE 1 TABLET BY MOUTH AS NEEDED EVERY 6 HOURS FOR 7 DAYS 12/11/17   [provider]  Menthol, Topical Analgesic, 2.5 % GEL Apply topically.    [provider]  prochlorperazine (COMPAZINE) 10 MG tablet Take 1 tablet (10 mg total) by mouth every 6 (six) hours as needed (Nausea or vomiting). 11/11/17   Volanda Napoleon, MD  SIMBRINZA 1-0.2 % SUSP  05/20/15   [provider]  triamcinolone cream (KENALOG) 0.1 % APPLY TO RASH/ITCH ON ARMS TWICE DAILY AS NEEDED 10/23/16   [provider]  venetoclax 100 MG TABS Take 400 mg by mouth daily. Take as directed. 03/17/18   Volanda Napoleon, MD     Family History  Problem Relation Age of Onset  . Heart disease Father   . Hypertension Father   . Heart disease Mother     Social History   Socioeconomic History  . Marital status: Married    Spouse name: Not on file  . Number of children: 2  . Years of education: Not on file  . Highest education level: Not on file  Occupational History  . Occupation: retired  Scientific laboratory technician  . Financial resource strain: Not on file  . Food insecurity:    Worry: Not on file    Inability: Not on file  . Transportation needs:    Medical: Not on file    Non-medical: Not on file  Tobacco Use  . Smoking status: Former Smoker    Packs/day: 2.00    Years: 40.00    Pack years: 80.00    Types: Cigarettes    Start date: 08/05/1959    Last attempt to quit: 06/11/1998    Years since quitting: 20.1  . Smokeless tobacco: Never Used  . Tobacco comment: quit smoking 15 years ago  Substance and Sexual Activity  . Alcohol use: Yes    Alcohol/week: 0.0 standard drinks    Comment: 4 cocktails qd  . Drug use: No  . Sexual activity: Not Currently  Lifestyle  . Physical activity:    Days per week:  Not on file    Minutes per session: Not on file  . Stress: Not on file  Relationships  . Social connections:    Talks on phone: Not on file    Gets together: Not on file    Attends religious service: Not on file    Active member of club or organization: Not on file    Attends meetings of clubs or organizations: Not on file    Relationship status: Not on file  Other Topics Concern  . Not on file  Social History Narrative  . Not on file     Review of Systems: A 12 point ROS discussed and  pertinent positives are indicated in the HPI above.  All other systems are negative.  Review of Systems  Vital Signs: BP 120/79   Pulse 100   Temp 97.8 F (36.6 C) (Oral)   Resp 20   SpO2 97%   Physical Exam Constitutional:      Appearance: Normal appearance.  HENT:     Mouth/Throat:     Mouth: Mucous membranes are moist.     Pharynx: Oropharynx is clear.  Cardiovascular:     Rate and Rhythm: Normal rate and regular rhythm.     Heart sounds: Normal heart sounds.  Pulmonary:     Effort: Pulmonary effort is normal. No respiratory distress.     Breath sounds: Normal breath sounds.  Skin:    General: Skin is warm and dry.  Neurological:     General: No focal deficit present.     Mental Status: He is alert and oriented to person, place, and time.  Psychiatric:        Mood and Affect: Mood normal.     Imaging: No results found.  Labs:  CBC: Recent Labs    04/21/18 0935 04/28/18 1355 05/05/18 1055 07/07/18 1100  WBC 0.9* 3.1* 4.3 8.3  HGB 9.5* 10.4* 8.5* 9.0*  HCT 30.7* 32.9* 27.5* 28.6*  PLT 582* 336 181 161    COAGS: Recent Labs    02/25/18 0719  INR 0.97    BMP: Recent Labs    02/25/18 0719  04/21/18 0935 04/28/18 1355 05/05/18 1055 07/07/18 1100  NA 141   < > 137 145 138 135  K 4.1   < > 4.0 4.2 3.8 4.2  CL 104   < > 105 108 110* 103  CO2 26   < > '25 25 25 26  ' GLUCOSE 100*   < > 109 121* 105 120*  BUN 20   < > '20 18 18 22  ' CALCIUM 9.0   < > 7.9*  8.9 7.8* 8.6*  CREATININE 1.26*   < > 1.20 1.30* 1.20 1.30*  GFRNONAA 52*  --   --   --   --  52*  GFRAA >60  --   --   --   --  60*   < > = values in this interval not displayed.    LIVER FUNCTION TESTS: Recent Labs    04/21/18 0935 04/28/18 1355 05/05/18 1055 07/07/18 1100  BILITOT 0.6 0.5 0.6 0.2*  AST '25 28 23 ' 13*  ALT '17 22 16 9  ' ALKPHOS 50 45 36 45  PROT 6.1* 6.3* 5.8* 5.9*  ALBUMIN 3.4* 3.3* 3.1* 3.6    TUMOR MARKERS: No results for input(s): AFPTM, CEA, CA199, CHROMGRNA in the last 8760 hours.  Assessment and Plan: Myelodysplastic syndrome For CT guided bone marrow biopsy. Labs pending Risks and benefits discussed with the patient including, but not limited to bleeding, infection, damage to adjacent structures or low yield requiring additional tests.  All of the patient's questions were answered, patient is agreeable to proceed. Consent signed and in chart.    Thank you for this interesting consult.  I greatly enjoyed meeting Anthony [name] and look forward to participating in their care.  A copy of this report was sent to the requesting provider on this date.  Electronically Signed: Ascencion Dike, PA-C 07/15/2018, 9:21 AM   I spent a total of 20 minutes in face to face in clinical consultation, greater than 50% of which was counseling/coordinating care for bone marrow biopsy

## 2018-07-15 NOTE — Discharge Instructions (Signed)
Bone Marrow Aspiration and Bone Marrow Biopsy, Adult, Care After °This sheet gives you information about how to care for yourself after your procedure. Your health care provider may also give you more specific instructions. If you have problems or questions, contact your health care provider. °What can I expect after the procedure? °After the procedure, it is common to have: °· Mild pain and tenderness. °· Swelling. °· Bruising. °Follow these instructions at home: °Puncture site care ° °  ° °· Follow instructions from your health care provider about how to take care of the puncture site. Make sure you: °? Wash your hands with soap and water before you change your bandage (dressing). If soap and water are not available, use hand sanitizer. °? Change your dressing as told by your health care provider. °· Check your puncture site every day for signs of infection. Check for: °? More redness, swelling, or pain. °? More fluid or blood. °? Warmth. °? Pus or a bad smell. °General instructions °· Take over-the-counter and prescription medicines only as told by your health care provider. °· Do not take baths, swim, or use a hot tub until your health care provider approves. Ask if you can take a shower or have a sponge bath. °· Return to your normal activities as told by your health care provider. Ask your health care provider what activities are safe for you. °· Do not drive for 24 hours if you were given a medicine to help you relax (sedative) during your procedure. °· Keep all follow-up visits as told by your health care provider. This is important. °Contact a health care provider if: °· Your pain is not controlled with medicine. °Get help right away if: °· You have a fever. °· You have more redness, swelling, or pain around the puncture site. °· You have more fluid or blood coming from the puncture site. °· Your puncture site feels warm to the touch. °· You have pus or a bad smell coming from the puncture site. °These  symptoms may represent a serious problem that is an emergency. Do not wait to see if the symptoms will go away. Get medical help right away. Call your local emergency services (911 in the [place] Do not drive yourself to the hospital. °Summary °· After the procedure, it is common to have mild pain, tenderness, swelling, and bruising. °· Follow instructions from your health care provider about how to take care of the puncture site. °· Get help right away if you have any symptoms of infection or if you have more blood or fluid coming from the puncture site. °This information is not intended to replace advice given to you by your health care provider. Make sure you discuss any questions you have with your health care provider. °Document Released: 12/15/2004 Document Revised: 09/10/2017 Document Reviewed: 11/09/2015 °Elsevier Interactive Patient Education © 2019 [employer] ° ° ° °Moderate Conscious Sedation, Adult, Care After °These instructions provide you with information about caring for yourself after your procedure. Your health care provider may also give you more specific instructions. Your treatment has been planned according to current medical practices, but problems sometimes occur. Call your health care provider if you have any problems or questions after your procedure. °What can I expect after the procedure? °After your procedure, it is common: °· To feel sleepy for several hours. °· To feel clumsy and have poor balance for several hours. °· To have poor judgment for several hours. °· To vomit if you eat too soon. °  Follow these instructions at home: °For at least 24 hours after the procedure: ° °· Do not: °? Participate in activities where you could fall or become injured. °? Drive. °? Use heavy machinery. °? Drink alcohol. °? Take sleeping pills or medicines that cause drowsiness. °? Make important decisions or sign legal documents. °? Take care of children on your own. °· Rest. °Eating and  drinking °· Follow the diet recommended by your health care provider. °· If you vomit: °? Drink water, juice, or soup when you can drink without vomiting. °? Make sure you have little or no nausea before eating solid foods. °General instructions °· Have a responsible adult stay with you until you are awake and alert. °· Take over-the-counter and prescription medicines only as told by your health care provider. °· If you smoke, do not smoke without supervision. °· Keep all follow-up visits as told by your health care provider. This is important. °Contact a health care provider if: °· You keep feeling nauseous or you keep vomiting. °· You feel light-headed. °· You develop a rash. °· You have a fever. °Get help right away if: °· You have trouble breathing. °This information is not intended to replace advice given to you by your health care provider. Make sure you discuss any questions you have with your health care provider. °Document Released: 03/18/2013 Document Revised: 10/31/2015 Document Reviewed: 09/17/2015 °Elsevier Interactive Patient Education © 2019 [employer] ° °

## 2018-07-15 NOTE — Procedures (Signed)
BM Bx and aspirate R iliac EBL 0 Comp 0

## 2018-07-28 ENCOUNTER — Inpatient Hospital Stay: Payer: Medicare Other | Attending: Family | Admitting: Hematology & Oncology

## 2018-07-28 ENCOUNTER — Encounter: Payer: Self-pay | Admitting: Hematology & Oncology

## 2018-07-28 ENCOUNTER — Inpatient Hospital Stay: Payer: Medicare Other

## 2018-07-28 ENCOUNTER — Other Ambulatory Visit: Payer: Self-pay

## 2018-07-28 VITALS — BP 115/82 | HR 102 | Resp 18 | Wt 178.0 lb

## 2018-07-28 DIAGNOSIS — D4620 Refractory anemia with excess of blasts, unspecified: Secondary | ICD-10-CM

## 2018-07-28 DIAGNOSIS — M810 Age-related osteoporosis without current pathological fracture: Secondary | ICD-10-CM | POA: Diagnosis not present

## 2018-07-28 DIAGNOSIS — M8000XG Age-related osteoporosis with current pathological fracture, unspecified site, subsequent encounter for fracture with delayed healing: Secondary | ICD-10-CM

## 2018-07-28 DIAGNOSIS — D649 Anemia, unspecified: Secondary | ICD-10-CM

## 2018-07-28 DIAGNOSIS — D500 Iron deficiency anemia secondary to blood loss (chronic): Secondary | ICD-10-CM

## 2018-07-28 DIAGNOSIS — D4621 Refractory anemia with excess of blasts 1: Secondary | ICD-10-CM | POA: Insufficient documentation

## 2018-07-28 LAB — CBC WITH DIFFERENTIAL (CANCER CENTER ONLY)
Abs Immature Granulocytes: 0.07 10*3/uL (ref 0.00–0.07)
Basophils Absolute: 0 10*3/uL (ref 0.0–0.1)
Basophils Relative: 0 %
Eosinophils Absolute: 0 10*3/uL (ref 0.0–0.5)
Eosinophils Relative: 0 %
HEMATOCRIT: 31.1 % — AB (ref 39.0–52.0)
HEMOGLOBIN: 9.9 g/dL — AB (ref 13.0–17.0)
Immature Granulocytes: 1 %
LYMPHS PCT: 7 %
Lymphs Abs: 0.4 10*3/uL — ABNORMAL LOW (ref 0.7–4.0)
MCH: 34.1 pg — ABNORMAL HIGH (ref 26.0–34.0)
MCHC: 31.8 g/dL (ref 30.0–36.0)
MCV: 107.2 fL — ABNORMAL HIGH (ref 80.0–100.0)
Monocytes Absolute: 0.3 10*3/uL (ref 0.1–1.0)
Monocytes Relative: 6 %
Neutro Abs: 4.4 10*3/uL (ref 1.7–7.7)
Neutrophils Relative %: 86 %
Platelet Count: 193 10*3/uL (ref 150–400)
RBC: 2.9 MIL/uL — ABNORMAL LOW (ref 4.22–5.81)
RDW: 18 % — ABNORMAL HIGH (ref 11.5–15.5)
WBC Count: 5.2 10*3/uL (ref 4.0–10.5)
nRBC: 0 % (ref 0.0–0.2)

## 2018-07-28 LAB — CMP (CANCER CENTER ONLY)
ALT: 9 U/L (ref 0–44)
AST: 13 U/L — ABNORMAL LOW (ref 15–41)
Albumin: 3.8 g/dL (ref 3.5–5.0)
Alkaline Phosphatase: 58 U/L (ref 38–126)
Anion gap: 9 (ref 5–15)
BILIRUBIN TOTAL: 0.3 mg/dL (ref 0.3–1.2)
BUN: 20 mg/dL (ref 8–23)
CALCIUM: 8.4 mg/dL — AB (ref 8.9–10.3)
CO2: 24 mmol/L (ref 22–32)
Chloride: 103 mmol/L (ref 98–111)
Creatinine: 1.26 mg/dL — ABNORMAL HIGH (ref 0.61–1.24)
GFR, Est AFR Am: >60 mL/min (ref >60)
GFR, Estimated: 54 mL/min — ABNORMAL LOW (ref >60)
Glucose, Bld: 191 mg/dL — ABNORMAL HIGH (ref 70–99)
Potassium: 4.4 mmol/L (ref 3.5–5.1)
Sodium: 136 mmol/L (ref 135–145)
Total Protein: 6.1 g/dL — ABNORMAL LOW (ref 6.5–8.1)

## 2018-07-28 LAB — SAMPLE TO BLOOD BANK

## 2018-07-28 LAB — RETICULOCYTES
Immature Retic Fract: 15.2 % (ref 2.3–15.9)
RBC.: 2.9 MIL/uL — ABNORMAL LOW (ref 4.22–5.81)
RETIC COUNT ABSOLUTE: 67.3 10*3/uL (ref 19.0–186.0)
Retic Ct Pct: 2.3 % (ref 0.4–3.1)

## 2018-07-28 LAB — SAVE SMEAR(SSMR), FOR PROVIDER SLIDE REVIEW

## 2018-07-28 MED ORDER — DARBEPOETIN ALFA 500 MCG/ML IJ SOSY: 400.0000 ug | PREFILLED_SYRINGE | Freq: Once | INTRAMUSCULAR | Status: DC

## 2018-07-28 MED ORDER — DARBEPOETIN ALFA 200 MCG/0.4ML IJ SOSY
400.0000 ug | PREFILLED_SYRINGE | Freq: Once | INTRAMUSCULAR | Status: AC
  Administered 2018-07-28: 400 ug via SUBCUTANEOUS

## 2018-07-28 NOTE — Progress Notes (Signed)
Hematology and Oncology Follow Up Visit  Anthony [name] [id number] 1940-11-03 78 y.o. 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  We did get a bone marrow test on him.  This was done on 07/15/2018.  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  I have to believe that the venetoclax is helping.  I do not have back his chromosome studies.  We will have to call Anthony [name] for this.  He is eating okay.  He is getting physical therapy at home.  He still is somewhat unsteady on his feet.  He restarted the venetoclax.  We titrated up his dose.  He is back on the 400 mg a day dose.  I think that the chromosome analysis of his bone marrow will be critical.  He has had no problems with diarrhea.  He has had no problems with bleeding.  There is been no leg swelling.  He has had no mouth sores.  Overall, his performance status is ECOG 2.  .      Medications:  Allergies as of 07/28/2018   No Known Allergies     Medication List       Accurate as of July 28, 2018  2:22 PM. Always use your most recent med list.        bimatoprost 0.01 % Soln Commonly known as:  LUMIGAN Place 1 drop into both eyes at bedtime.   budesonide-formoterol 160-4.5 MCG/ACT inhaler Commonly known as:  SYMBICORT Inhale 2 puffs into the lungs 2 (two) times daily.   buPROPion 150 MG 24 hr tablet Commonly known as:  WELLBUTRIN XL Take 150 mg by mouth daily.   CITRACAL PO Take 1,000 Units by  mouth 2 (two) times daily.   clopidogrel 75 MG tablet Commonly known as:  PLAVIX Take 75 mg by mouth daily.   co-enzyme Q-10 30 MG capsule Take 100 mg by mouth daily.   Cyanocobalamin 2500 MCG Tabs Take 5,000 mcg by mouth daily.   fish oil-omega-3 fatty acids 1000 MG capsule Take 1 g by mouth daily.   glucosamine-chondroitin 500-400 MG tablet Take 1 tablet by mouth daily.   HYDROcodone-acetaminophen 7.5-325 MG tablet Commonly known as:  NORCO TAKE 1 TABLET BY MOUTH AS NEEDED EVERY 6 HOURS FOR 7 DAYS   levofloxacin 500 MG tablet Commonly known as:  LEVAQUIN Take 1 tablet (500 mg total) by mouth daily.   LORazepam 0.5 MG tablet Commonly known as:  ATIVAN TAKE 1 TABLET BY MOUTH EVERY 6 HOURS AS NEEDED FOR NAUSEA OR VOMITING   magnesium oxide 400 MG tablet Commonly known as:  MAG-OX Take 400 mg by mouth daily.   Menthol (Topical Analgesic) 2.5 % Gel Apply topically.   NEURONTIN 300 MG capsule Generic drug:  gabapentin Take 300 mg by mouth daily.   pantoprazole 40 MG tablet Commonly known as:  PROTONIX Take 40 mg by mouth daily.   predniSONE 20 MG tablet Commonly known as:  DELTASONE Take 1 tablet (  20 mg total) by mouth daily with breakfast.   PROAIR RESPICLICK 505 (90 Base) MCG/ACT Aepb Generic drug:  Albuterol Sulfate Inhale 2 puffs into the lungs every 6 (six) hours as needed.   albuterol (2.5 MG/3ML) 0.083% nebulizer solution Commonly known as:  PROVENTIL Take 3 mLs (2.5 mg total) by nebulization every 6 (six) hours as needed for wheezing or shortness of breath.   prochlorperazine 10 MG tablet Commonly known as:  COMPAZINE Take 1 tablet (10 mg total) by mouth every 6 (six) hours as needed (Nausea or vomiting).   ranitidine 300 MG tablet Commonly known as:  ZANTAC Take 300 mg by mouth at bedtime.   sertraline 100 MG tablet Commonly known as:  ZOLOFT Take 100 mg by mouth daily.   SIMBRINZA 1-0.2 % Susp Generic drug:  Brinzolamide-Brimonidine     simvastatin 40 MG tablet Commonly known as:  ZOCOR Take 40 mg by mouth every other day.   tamsulosin 0.4 MG Caps capsule Commonly known as:  FLOMAX   tiotropium 18 MCG inhalation capsule Commonly known as:  SPIRIVA Place 18 mcg into inhaler and inhale daily.   triamcinolone cream 0.1 % Commonly known as:  KENALOG APPLY TO RASH/ITCH ON ARMS TWICE DAILY AS NEEDED   venetoclax 100 MG Tabs Take 400 mg by mouth daily. Take as directed.   Vitamin D3 125 MCG (5000 UT) Tabs Take by mouth daily.       Allergies: No Known Allergies  Past Medical History, Surgical history, Social history, and Family History were reviewed and updated.  Review of Systems: Review of Systems  Constitutional: Positive for malaise/fatigue.  HENT: Negative.   Eyes: Negative.   Respiratory: Negative.   Cardiovascular: Negative.   Gastrointestinal: Negative.   Genitourinary: Negative.   Musculoskeletal: Positive for back pain.  Skin: Negative.   Neurological: Negative.   Endo/Heme/Allergies: Negative.   Psychiatric/Behavioral: Negative.      Physical Exam:  weight is 178 lb (80.7 kg). His blood pressure is 115/82 and his pulse is 102 (abnormal). His respiration is 18 and oxygen saturation is 100%.   Wt Readings from Last 3 Encounters:  07/28/18 178 lb (80.7 kg)  07/07/18 178 lb 4 oz (80.9 kg)  04/28/18 190 lb (86.2 kg)    Physical Exam Vitals signs reviewed.  HENT:     Head: Normocephalic and atraumatic.  Eyes:     Pupils: Pupils are equal, round, and reactive to light.  Neck:     Musculoskeletal: Normal range of motion.  Cardiovascular:     Rate and Rhythm: Normal rate and regular rhythm.     Heart sounds: Normal heart sounds.  Pulmonary:     Effort: Pulmonary effort is normal.     Breath sounds: Normal breath sounds.  Abdominal:     General: Bowel sounds are normal.     Palpations: Abdomen is soft.  Musculoskeletal: Normal range of motion.        General: No tenderness or  deformity.  Lymphadenopathy:     Cervical: No cervical adenopathy.  Skin:    General: Skin is warm and dry.     Findings: No erythema or rash.  Neurological:     Mental Status: He is alert and oriented to person, place, and time.  Psychiatric:        Behavior: Behavior normal.        Thought Content: Thought content normal.        Judgment: Judgment normal.      Lab Results  Component Value Date   WBC 5.2 07/28/2018   HGB 9.9 (L) 07/28/2018   HCT 31.1 (L) 07/28/2018   MCV 107.2 (H) 07/28/2018   PLT 193 07/28/2018   Lab Results  Component Value Date   FERRITIN 2,328 (H) 07/07/2018   IRON 64 07/07/2018   TIBC 231 07/07/2018   UIBC 167 07/07/2018   IRONPCTSAT 28 07/07/2018   Lab Results  Component Value Date   RETICCTPCT 2.3 07/28/2018   RBC 2.90 (L) 07/28/2018   RBC 2.90 (L) 07/28/2018   RETICCTABS 39.4 05/30/2015   Lab Results  Component Value Date   KAPLAMBRATIO 1.11 12/17/2016   Lab Results  Component Value Date   IGGSERUM 735 12/17/2016   IGMSERUM 79 12/17/2016   Lab Results  Component Value Date   TOTALPROTELP 7.3 01/01/2014   ALBUMINELP 55.1 (L) 01/01/2014   A1GS 6.9 (H) 01/01/2014   A2GS 8.9 01/01/2014   BETS 7.9 (H) 01/01/2014   BETA2SER 6.7 (H) 01/01/2014   GAMS 14.5 01/01/2014   MSPIKE Not Observed 12/17/2016   SPEI * 01/01/2014     Chemistry      Component Value Date/Time   NA 136 07/28/2018 1300   NA 141 05/06/2017 0901   NA 140 04/16/2016 0930   K 4.4 07/28/2018 1300   K 3.8 05/06/2017 0901   K 3.9 04/16/2016 0930   CL 103 07/28/2018 1300   CL 105 05/06/2017 0901   CO2 24 07/28/2018 1300   CO2 28 05/06/2017 0901   CO2 24 04/16/2016 0930   BUN 20 07/28/2018 1300   BUN 27 (H) 05/06/2017 0901   BUN 20.5 04/16/2016 0930   CREATININE 1.26 (H) 07/28/2018 1300   CREATININE 1.4 (H) 05/06/2017 0901   CREATININE 1.2 04/16/2016 0930      Component Value Date/Time   CALCIUM 8.4 (L) 07/28/2018 1300   CALCIUM 8.8 05/06/2017 0901    CALCIUM 8.9 04/16/2016 0930   ALKPHOS 58 07/28/2018 1300   ALKPHOS 46 05/06/2017 0901   ALKPHOS 54 04/16/2016 0930   AST 13 (L) 07/28/2018 1300   AST 16 04/16/2016 0930   ALT 9 07/28/2018 1300   ALT 25 05/06/2017 0901   ALT 21 04/16/2016 0930   BILITOT 0.3 07/28/2018 1300   BILITOT 0.27 04/16/2016 0930      Impression and Plan: Mr. 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   Again, we will have to see what the chromosome analysis looks like.  We will continue him on the venetoclax.  I would like to give him the Zometa when we see him back.  He does have osteoporotic type changes in his bones.  I will also need to send off his peripheral blood for NGS testing.  We will see him back in 4 weeks.  I am just happy that he is responding.  I am happy that he does not need to be transfused.  Hopefully, we will be able to keep him free of transfusions for several months.   Volanda Napoleon, MD 2/17/20202:22 PM

## 2018-07-28 NOTE — Patient Instructions (Addendum)

## 2018-07-28 NOTE — Patient Instructions (Signed)

## 2018-07-30 ENCOUNTER — Telehealth: Payer: Self-pay | Admitting: Hematology & Oncology

## 2018-07-30 LAB — FERRITIN: Ferritin: 2235 ng/mL — ABNORMAL HIGH (ref 24–336)

## 2018-07-30 LAB — IRON AND TIBC
Iron: 66 ug/dL (ref 42–163)
SATURATION RATIOS: 25 % (ref 20–55)
TIBC: 269 ug/dL (ref 202–409)
UIBC: 203 ug/dL (ref 117–376)

## 2018-07-30 NOTE — Telephone Encounter (Signed)
Sch appts per 2/17 LOS appt letter mailed 2/19

## 2018-08-02 ENCOUNTER — Encounter (HOSPITAL_COMMUNITY): Payer: Self-pay | Admitting: Hematology & Oncology

## 2018-08-04 MED FILL — predniSONE 20 MG TABS: 20 | 30 days supply | Qty: 30 | Fill #1

## 2018-08-07 MED FILL — VENCLEXTA 100 MG TABS: 100 | 30 days supply | Qty: 120 | Fill #3

## 2018-08-18 ENCOUNTER — Other Ambulatory Visit: Payer: Medicare Other

## 2018-08-18 ENCOUNTER — Ambulatory Visit: Payer: Medicare Other | Admitting: Hematology & Oncology

## 2018-08-18 ENCOUNTER — Ambulatory Visit: Payer: Medicare Other

## 2018-08-19 ENCOUNTER — Ambulatory Visit: Payer: Medicare Other

## 2018-08-20 ENCOUNTER — Ambulatory Visit: Payer: Medicare Other

## 2018-08-21 ENCOUNTER — Ambulatory Visit: Payer: Medicare Other

## 2018-08-22 ENCOUNTER — Ambulatory Visit: Payer: Medicare Other

## 2018-08-25 ENCOUNTER — Inpatient Hospital Stay: Payer: Medicare Other

## 2018-08-25 ENCOUNTER — Other Ambulatory Visit: Payer: Self-pay

## 2018-08-25 ENCOUNTER — Inpatient Hospital Stay: Payer: Medicare Other | Attending: Family | Admitting: Hematology & Oncology

## 2018-08-25 VITALS — BP 124/76 | HR 87 | Temp 98.1°F | Resp 19 | Wt 179.1 lb

## 2018-08-25 DIAGNOSIS — D4620 Refractory anemia with excess of blasts, unspecified: Secondary | ICD-10-CM

## 2018-08-25 DIAGNOSIS — D4621 Refractory anemia with excess of blasts 1: Secondary | ICD-10-CM | POA: Diagnosis not present

## 2018-08-25 DIAGNOSIS — Z79899 Other long term (current) drug therapy: Secondary | ICD-10-CM | POA: Insufficient documentation

## 2018-08-25 LAB — RETICULOCYTES
Immature Retic Fract: 6.7 % (ref 2.3–15.9)
RBC.: 3.28 MIL/uL — ABNORMAL LOW (ref 4.22–5.81)
Retic Count, Absolute: 34.4 10*3/uL (ref 19.0–186.0)
Retic Ct Pct: 1.1 % (ref 0.4–3.1)

## 2018-08-25 LAB — CMP (CANCER CENTER ONLY)
ALT: 10 U/L (ref 0–44)
AST: 14 U/L — ABNORMAL LOW (ref 15–41)
Albumin: 3.8 g/dL (ref 3.5–5.0)
Alkaline Phosphatase: 37 U/L — ABNORMAL LOW (ref 38–126)
Anion gap: 9 (ref 5–15)
BUN: 18 mg/dL (ref 8–23)
CO2: 24 mmol/L (ref 22–32)
Calcium: 8.2 mg/dL — ABNORMAL LOW (ref 8.9–10.3)
Chloride: 106 mmol/L (ref 98–111)
Creatinine: 1.25 mg/dL — ABNORMAL HIGH (ref 0.61–1.24)
GFR, Est AFR Am: >60 mL/min (ref >60)
GFR, Estimated: 54 mL/min — ABNORMAL LOW (ref >60)
Glucose, Bld: 103 mg/dL — ABNORMAL HIGH (ref 70–99)
Potassium: 3.9 mmol/L (ref 3.5–5.1)
SODIUM: 139 mmol/L (ref 135–145)
Total Bilirubin: 0.2 mg/dL — ABNORMAL LOW (ref 0.3–1.2)
Total Protein: 5.9 g/dL — ABNORMAL LOW (ref 6.5–8.1)

## 2018-08-25 LAB — CBC WITH DIFFERENTIAL (CANCER CENTER ONLY)
Abs Immature Granulocytes: 0.05 10*3/uL (ref 0.00–0.07)
Basophils Absolute: 0 10*3/uL (ref 0.0–0.1)
Basophils Relative: 0 %
Eosinophils Absolute: 0 10*3/uL (ref 0.0–0.5)
Eosinophils Relative: 0 %
HCT: 36.7 % — ABNORMAL LOW (ref 39.0–52.0)
Hemoglobin: 11.7 g/dL — ABNORMAL LOW (ref 13.0–17.0)
Immature Granulocytes: 1 %
Lymphocytes Relative: 16 %
Lymphs Abs: 0.6 10*3/uL — ABNORMAL LOW (ref 0.7–4.0)
MCH: 35.7 pg — ABNORMAL HIGH (ref 26.0–34.0)
MCHC: 31.9 g/dL (ref 30.0–36.0)
MCV: 111.9 fL — ABNORMAL HIGH (ref 80.0–100.0)
Monocytes Absolute: 0.4 10*3/uL (ref 0.1–1.0)
Monocytes Relative: 11 %
Neutro Abs: 2.6 10*3/uL (ref 1.7–7.7)
Neutrophils Relative %: 72 %
Platelet Count: 131 10*3/uL — ABNORMAL LOW (ref 150–400)
RBC: 3.28 MIL/uL — AB (ref 4.22–5.81)
RDW: 15.9 % — ABNORMAL HIGH (ref 11.5–15.5)
WBC: 3.7 10*3/uL — AB (ref 4.0–10.5)
nRBC: 0 % (ref 0.0–0.2)

## 2018-08-25 LAB — LACTATE DEHYDROGENASE: LDH: 195 U/L — ABNORMAL HIGH (ref 98–192)

## 2018-08-25 LAB — SAVE SMEAR(SSMR), FOR PROVIDER SLIDE REVIEW

## 2018-08-25 MED ORDER — ZOLEDRONIC ACID 4 MG/5ML IV CONC: 4.0000 mg | Freq: Once | INTRAVENOUS | Status: DC

## 2018-08-25 MED ORDER — SODIUM CHLORIDE 0.9 % IV SOLN
Freq: Once | INTRAVENOUS | Status: AC
  Filled 2018-08-25: qty 250

## 2018-08-25 MED ORDER — DARBEPOETIN ALFA 500 MCG/ML IJ SOSY: 400.0000 ug | PREFILLED_SYRINGE | Freq: Once | INTRAMUSCULAR | Status: DC

## 2018-08-25 MED ORDER — SODIUM CHLORIDE 0.9% FLUSH
3.0000 mL | Freq: Once | INTRAVENOUS | Status: AC | PRN
  Filled 2018-08-25: qty 10

## 2018-08-25 NOTE — Progress Notes (Signed)
Calcium = 8.2 today, Albumin = 3.8 Will hold Zometa today per Dr. Marin Olp and give when calcium level returns to normal. I spoke with the patient in the infusion area. He and his wife state that he takes an OTC calcium supplement twice a day. He will increase to 3 times a day.

## 2018-08-25 NOTE — Progress Notes (Signed)
Hematology and Oncology Follow Up Visit  Anthony [name] [id number] January 30, 1941 78 y.o. 08/25/2018   Principle Diagnosis:  Refractory anemia with excess blasts (RAEB-1) - normal chromosomes  (AXSL1, TET2 and ZRSR2 by NGS)  Past Therapy: Vidaza 75 mg/m d1-5 - s/p 3 cycles then progression  Current Therapy:   Venetoclax 400 mg PO daily --re-started on 07/13/2018 Decitabine - q day x 5 days - s/p cycle #20 -- hold Aranesp 400 mcg subcutaneous as needed for hemoglobin less than 10 Zometa 4 mg IV every 6 months   Interim History: Anthony [name] is here today with his wife for follow-up.  He looks incredibly well.  I am just so happy for him.  It is apparent that the venetoclax by itself seems to be doing quite well.    I think what is incredibly important is the fact that the trisomy 11 is no longer present.  The chromosomes on his recent bone marrow came back normal.  He is getting around a little bit.  He is using a walker.  He has bad osteoporosis.  I am going to hold his Zometa today because his calcium is a little bit on the low side.  There is no bleeding.  He has had no problems with bowels or bladder.  There is no fever.  He has had no issues with leg swelling.  Overall, his performance status is ECOG 2.  .      Medications:  Allergies as of 08/25/2018   No Known Allergies     Medication List       Accurate as of August 25, 2018 12:58 PM. Always use your most recent med list.        bimatoprost 0.01 % Soln Commonly known as:  LUMIGAN Place 1 drop into both eyes at bedtime.   budesonide-formoterol 160-4.5 MCG/ACT inhaler Commonly known as:  SYMBICORT Inhale 2 puffs into the lungs 2 (two) times daily.   buPROPion 150 MG 24 hr tablet Commonly known as:  WELLBUTRIN XL Take 150 mg by mouth daily.   CITRACAL PO Take 1,000 Units by mouth 2 (two) times daily.   clopidogrel 75 MG tablet Commonly known as:  PLAVIX Take 75 mg by mouth daily.   co-enzyme Q-10 30 MG  capsule Take 100 mg by mouth daily.   Cyanocobalamin 2500 MCG Tabs Take 5,000 mcg by mouth daily.   fish oil-omega-3 fatty acids 1000 MG capsule Take 1 g by mouth daily.   glucosamine-chondroitin 500-400 MG tablet Take 1 tablet by mouth daily.   HYDROcodone-acetaminophen 7.5-325 MG tablet Commonly known as:  NORCO TAKE 1 TABLET BY MOUTH AS NEEDED EVERY 6 HOURS FOR 7 DAYS   levofloxacin 500 MG tablet Commonly known as:  Levaquin Take 1 tablet (500 mg total) by mouth daily.   LORazepam 0.5 MG tablet Commonly known as:  ATIVAN TAKE 1 TABLET BY MOUTH EVERY 6 HOURS AS NEEDED FOR NAUSEA OR VOMITING   magnesium oxide 400 MG tablet Commonly known as:  MAG-OX Take 400 mg by mouth daily.   Menthol (Topical Analgesic) 2.5 % Gel Apply topically.   Neurontin 300 MG capsule Generic drug:  gabapentin Take 300 mg by mouth daily.   pantoprazole 40 MG tablet Commonly known as:  PROTONIX Take 40 mg by mouth daily.   predniSONE 20 MG tablet Commonly known as:  DELTASONE Take 1 tablet (20 mg total) by mouth daily with breakfast.   ProAir RespiClick 109 (90 Base) MCG/ACT Aepb Generic drug:  Albuterol  Sulfate Inhale 2 puffs into the lungs every 6 (six) hours as needed.   albuterol (2.5 MG/3ML) 0.083% nebulizer solution Commonly known as:  PROVENTIL Take 3 mLs (2.5 mg total) by nebulization every 6 (six) hours as needed for wheezing or shortness of breath.   prochlorperazine 10 MG tablet Commonly known as:  COMPAZINE Take 1 tablet (10 mg total) by mouth every 6 (six) hours as needed (Nausea or vomiting).   ranitidine 300 MG tablet Commonly known as:  ZANTAC Take 300 mg by mouth at bedtime.   sertraline 100 MG tablet Commonly known as:  ZOLOFT Take 100 mg by mouth daily.   Simbrinza 1-0.2 % Susp Generic drug:  Brinzolamide-Brimonidine   simvastatin 40 MG tablet Commonly known as:  ZOCOR Take 40 mg by mouth every other day.   tamsulosin 0.4 MG Caps capsule Commonly known  as:  FLOMAX   tiotropium 18 MCG inhalation capsule Commonly known as:  SPIRIVA Place 18 mcg into inhaler and inhale daily.   triamcinolone cream 0.1 % Commonly known as:  KENALOG APPLY TO RASH/ITCH ON ARMS TWICE DAILY AS NEEDED   venetoclax 100 MG Tabs Take 400 mg by mouth daily. Take as directed.   Vitamin D3 125 MCG (5000 UT) Tabs Take by mouth daily.       Allergies: No Known Allergies  Past Medical History, Surgical history, Social history, and Family History were reviewed and updated.  Review of Systems: Review of Systems  Constitutional: Positive for malaise/fatigue.  HENT: Negative.   Eyes: Negative.   Respiratory: Negative.   Cardiovascular: Negative.   Gastrointestinal: Negative.   Genitourinary: Negative.   Musculoskeletal: Positive for back pain.  Skin: Negative.   Neurological: Negative.   Endo/Heme/Allergies: Negative.   Psychiatric/Behavioral: Negative.      Physical Exam:  weight is 179 lb 1.6 oz (81.2 kg). His oral temperature is 98.1 F (36.7 C). His blood pressure is 124/76 and his pulse is 87. His respiration is 19 and oxygen saturation is 99%.   Wt Readings from Last 3 Encounters:  08/25/18 179 lb 1.6 oz (81.2 kg)  07/28/18 178 lb (80.7 kg)  07/07/18 178 lb 4 oz (80.9 kg)    Physical Exam Vitals signs reviewed.  HENT:     Head: Normocephalic and atraumatic.  Eyes:     Pupils: Pupils are equal, round, and reactive to light.  Neck:     Musculoskeletal: Normal range of motion.  Cardiovascular:     Rate and Rhythm: Normal rate and regular rhythm.     Heart sounds: Normal heart sounds.  Pulmonary:     Effort: Pulmonary effort is normal.     Breath sounds: Normal breath sounds.  Abdominal:     General: Bowel sounds are normal.     Palpations: Abdomen is soft.  Musculoskeletal: Normal range of motion.        General: No tenderness or deformity.  Lymphadenopathy:     Cervical: No cervical adenopathy.  Skin:    General: Skin is warm  and dry.     Findings: No erythema or rash.  Neurological:     Mental Status: He is alert and oriented to person, place, and time.  Psychiatric:        Behavior: Behavior normal.        Thought Content: Thought content normal.        Judgment: Judgment normal.      Lab Results  Component Value Date   WBC 3.7 (L) 08/25/2018  HGB 11.7 (L) 08/25/2018   HCT 36.7 (L) 08/25/2018   MCV 111.9 (H) 08/25/2018   PLT 131 (L) 08/25/2018   Lab Results  Component Value Date   FERRITIN 2,235 (H) 07/28/2018   IRON 66 07/28/2018   TIBC 269 07/28/2018   UIBC 203 07/28/2018   IRONPCTSAT 25 07/28/2018   Lab Results  Component Value Date   RETICCTPCT 1.1 08/25/2018   RBC 3.28 (L) 08/25/2018   RBC 3.28 (L) 08/25/2018   RETICCTABS 39.4 05/30/2015   Lab Results  Component Value Date   KAPLAMBRATIO 1.11 12/17/2016   Lab Results  Component Value Date   IGGSERUM 735 12/17/2016   IGMSERUM 79 12/17/2016   Lab Results  Component Value Date   TOTALPROTELP 7.3 01/01/2014   ALBUMINELP 55.1 (L) 01/01/2014   A1GS 6.9 (H) 01/01/2014   A2GS 8.9 01/01/2014   BETS 7.9 (H) 01/01/2014   BETA2SER 6.7 (H) 01/01/2014   GAMS 14.5 01/01/2014   MSPIKE Not Observed 12/17/2016   SPEI * 01/01/2014     Chemistry      Component Value Date/Time   NA 139 08/25/2018 1211   NA 141 05/06/2017 0901   NA 140 04/16/2016 0930   K 3.9 08/25/2018 1211   K 3.8 05/06/2017 0901   K 3.9 04/16/2016 0930   CL 106 08/25/2018 1211   CL 105 05/06/2017 0901   CO2 24 08/25/2018 1211   CO2 28 05/06/2017 0901   CO2 24 04/16/2016 0930   BUN 18 08/25/2018 1211   BUN 27 (H) 05/06/2017 0901   BUN 20.5 04/16/2016 0930   CREATININE 1.25 (H) 08/25/2018 1211   CREATININE 1.4 (H) 05/06/2017 0901   CREATININE 1.2 04/16/2016 0930      Component Value Date/Time   CALCIUM 8.2 (L) 08/25/2018 1211   CALCIUM 8.8 05/06/2017 0901   CALCIUM 8.9 04/16/2016 0930   ALKPHOS 37 (L) 08/25/2018 1211   ALKPHOS 46 05/06/2017 0901    ALKPHOS 54 04/16/2016 0930   AST 14 (L) 08/25/2018 1211   AST 16 04/16/2016 0930   ALT 10 08/25/2018 1211   ALT 25 05/06/2017 0901   ALT 21 04/16/2016 0930   BILITOT 0.2 (L) 08/25/2018 1211   BILITOT 0.27 04/16/2016 0930      Impression and Plan: Mr. 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   For right now, we will continue him on the venetoclax.  It is definitely working for him.  I will plan to get him back to see me in another 4 weeks.  We will do the Zometa at that time.  I am just happy that his quality life is improving.   Volanda Napoleon, MD 3/16/202012:58 PM

## 2018-08-25 NOTE — Patient Instructions (Signed)
Implanted Port Insertion, Care After  This sheet gives you information about how to care for yourself after your procedure. Your health care provider may also give you more specific instructions. If you have problems or questions, contact your health care provider.  What can I expect after the procedure?  After the procedure, it is common to have:  · Discomfort at the port insertion site.  · Bruising on the skin over the port. This should improve over 3-4 days.  Follow these instructions at home:  Port care  · After your port is placed, you will get a manufacturer's information card. The card has information about your port. Keep this card with you at all times.  · Take care of the port as told by your health care provider. Ask your health care provider if you or a family member can get training for taking care of the port at home. A home health care nurse may also take care of the port.  · Make sure to remember what type of port you have.  Incision care         · Follow instructions from your health care provider about how to take care of your port insertion site. Make sure you:  ? Wash your hands with soap and water before and after you change your bandage (dressing). If soap and water are not available, use hand sanitizer.  ? Change your dressing as told by your health care provider.  ? Leave stitches (sutures), skin glue, or adhesive strips in place. These skin closures may need to stay in place for 2 weeks or longer. If adhesive strip edges start to loosen and curl up, you may trim the loose edges. Do not remove adhesive strips completely unless your health care provider tells you to do that.  · Check your port insertion site every day for signs of infection. Check for:  ? Redness, swelling, or pain.  ? Fluid or blood.  ? Warmth.  ? Pus or a bad smell.  Activity  · Return to your normal activities as told by your health care provider. Ask your health care provider what activities are safe for you.  · Do not  lift anything that is heavier than 10 lb (4.5 kg), or the limit that you are told, until your health care provider says that it is safe.  General instructions  · Take over-the-counter and prescription medicines only as told by your health care provider.  · Do not take baths, swim, or use a hot tub until your health care provider approves. Ask your health care provider if you may take showers. You may only be allowed to take sponge baths.  · Do not drive for 24 hours if you were given a sedative during your procedure.  · Wear a medical alert bracelet in case of an emergency. This will tell any health care providers that you have a port.  · Keep all follow-up visits as told by your health care provider. This is important.  Contact a health care provider if:  · You cannot flush your port with saline as directed, or you cannot draw blood from the port.  · You have a fever or chills.  · You have redness, swelling, or pain around your port insertion site.  · You have fluid or blood coming from your port insertion site.  · Your port insertion site feels warm to the touch.  · You have pus or a bad smell coming from the port   insertion site.  Get help right away if:  · You have chest pain or shortness of breath.  · You have bleeding from your port that you cannot control.  Summary  · Take care of the port as told by your health care provider. Keep the manufacturer's information card with you at all times.  · Change your dressing as told by your health care provider.  · Contact a health care provider if you have a fever or chills or if you have redness, swelling, or pain around your port insertion site.  · Keep all follow-up visits as told by your health care provider.  This information is not intended to replace advice given to you by your health care provider. Make sure you discuss any questions you have with your health care provider.  Document Released: 03/18/2013 Document Revised: 12/24/2017 Document Reviewed:  12/24/2017  [employer] Interactive Patient Education © 2019 [employer]

## 2018-08-26 LAB — IRON AND TIBC
Iron: 98 ug/dL (ref 42–163)
Saturation Ratios: 40 % (ref 20–55)
TIBC: 244 ug/dL (ref 202–409)
UIBC: 146 ug/dL (ref 117–376)

## 2018-08-26 LAB — FERRITIN: FERRITIN: 1502 ng/mL — AB (ref 24–336)

## 2018-09-06 ENCOUNTER — Other Ambulatory Visit: Payer: Self-pay | Admitting: Hematology & Oncology

## 2018-09-06 DIAGNOSIS — D4620 Refractory anemia with excess of blasts, unspecified: Secondary | ICD-10-CM

## 2018-09-08 MED FILL — predniSONE 20 MG TABS: 20 | 30 days supply | Qty: 30 | Fill #2

## 2018-09-08 MED FILL — VENCLEXTA 100 MG TABS: 100 | 30 days supply | Qty: 120 | Fill #0

## 2018-09-22 ENCOUNTER — Other Ambulatory Visit: Payer: Medicare Other

## 2018-09-22 ENCOUNTER — Ambulatory Visit: Payer: Medicare Other | Admitting: Hematology & Oncology

## 2018-10-10 ENCOUNTER — Other Ambulatory Visit: Payer: Self-pay | Admitting: Hematology & Oncology

## 2018-10-10 DIAGNOSIS — D4620 Refractory anemia with excess of blasts, unspecified: Secondary | ICD-10-CM

## 2018-10-10 DIAGNOSIS — F419 Anxiety disorder, unspecified: Secondary | ICD-10-CM

## 2018-10-10 DIAGNOSIS — R110 Nausea: Secondary | ICD-10-CM

## 2018-10-13 MED FILL — VENCLEXTA 100 MG TABS: 100 | 30 days supply | Qty: 120 | Fill #1

## 2018-10-13 MED FILL — LORazepam 0.5 MG TABS: 0.5 | 23 days supply | Qty: 90 | Fill #0

## 2018-10-13 MED FILL — predniSONE 20 MG TABS: 20 | 30 days supply | Qty: 30 | Fill #3

## 2018-10-17 ENCOUNTER — Ambulatory Visit: Payer: Medicare Other | Admitting: Hematology & Oncology

## 2018-10-17 ENCOUNTER — Other Ambulatory Visit: Payer: Medicare Other

## 2018-11-15 MED FILL — VENCLEXTA 100 MG TABS: 100 | 30 days supply | Qty: 120 | Fill #2

## 2018-11-15 MED FILL — predniSONE 20 MG TABS: 20 | 30 days supply | Qty: 30 | Fill #4

## 2018-12-05 ENCOUNTER — Other Ambulatory Visit: Payer: Medicare Other

## 2018-12-05 ENCOUNTER — Ambulatory Visit: Payer: Medicare Other | Admitting: Hematology & Oncology

## 2018-12-09 ENCOUNTER — Other Ambulatory Visit: Payer: Self-pay | Admitting: Hematology & Oncology

## 2018-12-09 DIAGNOSIS — F064 Anxiety disorder due to known physiological condition: Secondary | ICD-10-CM

## 2018-12-09 DIAGNOSIS — D4620 Refractory anemia with excess of blasts, unspecified: Secondary | ICD-10-CM

## 2018-12-16 MED FILL — predniSONE 20 MG TABS: 20 | 30 days supply | Qty: 30 | Fill #0

## 2018-12-16 MED FILL — VENCLEXTA 100 MG TABS: 100 | 30 days supply | Qty: 120 | Fill #3

## 2018-12-29 ENCOUNTER — Other Ambulatory Visit: Payer: Self-pay | Admitting: Hematology & Oncology

## 2018-12-29 DIAGNOSIS — D4620 Refractory anemia with excess of blasts, unspecified: Secondary | ICD-10-CM

## 2018-12-29 DIAGNOSIS — R110 Nausea: Secondary | ICD-10-CM

## 2018-12-29 DIAGNOSIS — F419 Anxiety disorder, unspecified: Secondary | ICD-10-CM

## 2018-12-29 MED FILL — LORazepam 0.5 MG TABS: 0.5 | 23 days supply | Qty: 90 | Fill #0

## 2019-01-07 ENCOUNTER — Ambulatory Visit: Payer: Medicare Other | Admitting: Hematology & Oncology

## 2019-01-07 ENCOUNTER — Other Ambulatory Visit: Payer: Medicare Other

## 2019-01-26 MED FILL — predniSONE 20 MG TABS: 20 | 30 days supply | Qty: 30 | Fill #1

## 2019-02-02 ENCOUNTER — Inpatient Hospital Stay (HOSPITAL_COMMUNITY)
Admission: AD | Admit: 2019-02-02 | Discharge: 2019-02-06 | DRG: 835 | Disposition: A | Discharge status: Home Health | Payer: Medicare Other | Source: Other Acute Inpatient Hospital | Attending: Internal Medicine | Admitting: Internal Medicine

## 2019-02-02 DIAGNOSIS — Z66 Do not resuscitate: Secondary | ICD-10-CM | POA: Diagnosis present

## 2019-02-02 DIAGNOSIS — R531 Weakness: Secondary | ICD-10-CM | POA: Diagnosis present

## 2019-02-02 DIAGNOSIS — I10 Essential (primary) hypertension: Secondary | ICD-10-CM | POA: Diagnosis present

## 2019-02-02 DIAGNOSIS — N179 Acute kidney failure, unspecified: Secondary | ICD-10-CM | POA: Diagnosis present

## 2019-02-02 DIAGNOSIS — J449 Chronic obstructive pulmonary disease, unspecified: Secondary | ICD-10-CM | POA: Diagnosis present

## 2019-02-02 DIAGNOSIS — Z87891 Personal history of nicotine dependence: Secondary | ICD-10-CM

## 2019-02-02 DIAGNOSIS — D61818 Other pancytopenia: Secondary | ICD-10-CM | POA: Diagnosis present

## 2019-02-02 DIAGNOSIS — D696 Thrombocytopenia, unspecified: Secondary | ICD-10-CM | POA: Diagnosis not present

## 2019-02-02 DIAGNOSIS — I129 Hypertensive chronic kidney disease with stage 1 through stage 4 chronic kidney disease, or unspecified chronic kidney disease: Secondary | ICD-10-CM | POA: Diagnosis present

## 2019-02-02 DIAGNOSIS — F329 Major depressive disorder, single episode, unspecified: Secondary | ICD-10-CM | POA: Diagnosis present

## 2019-02-02 DIAGNOSIS — E785 Hyperlipidemia, unspecified: Secondary | ICD-10-CM | POA: Diagnosis present

## 2019-02-02 DIAGNOSIS — N183 Chronic kidney disease, stage 3 (moderate): Secondary | ICD-10-CM | POA: Diagnosis present

## 2019-02-02 DIAGNOSIS — N390 Urinary tract infection, site not specified: Secondary | ICD-10-CM | POA: Diagnosis present

## 2019-02-02 DIAGNOSIS — Z7952 Long term (current) use of systemic steroids: Secondary | ICD-10-CM

## 2019-02-02 DIAGNOSIS — Z8249 Family history of ischemic heart disease and other diseases of the circulatory system: Secondary | ICD-10-CM

## 2019-02-02 DIAGNOSIS — R338 Other retention of urine: Secondary | ICD-10-CM | POA: Diagnosis present

## 2019-02-02 DIAGNOSIS — D4620 Refractory anemia with excess of blasts, unspecified: Secondary | ICD-10-CM

## 2019-02-02 DIAGNOSIS — D46Z Other myelodysplastic syndromes: Secondary | ICD-10-CM | POA: Diagnosis present

## 2019-02-02 DIAGNOSIS — N401 Enlarged prostate with lower urinary tract symptoms: Secondary | ICD-10-CM

## 2019-02-02 DIAGNOSIS — G629 Polyneuropathy, unspecified: Secondary | ICD-10-CM | POA: Diagnosis present

## 2019-02-02 DIAGNOSIS — D649 Anemia, unspecified: Secondary | ICD-10-CM | POA: Diagnosis present

## 2019-02-02 DIAGNOSIS — Z20828 Contact with and (suspected) exposure to other viral communicable diseases: Secondary | ICD-10-CM | POA: Diagnosis present

## 2019-02-02 DIAGNOSIS — Z978 Presence of other specified devices: Secondary | ICD-10-CM

## 2019-02-02 DIAGNOSIS — Z7902 Long term (current) use of antithrombotics/antiplatelets: Secondary | ICD-10-CM

## 2019-02-02 DIAGNOSIS — D469 Myelodysplastic syndrome, unspecified: Secondary | ICD-10-CM

## 2019-02-02 DIAGNOSIS — C9200 Acute myeloblastic leukemia, not having achieved remission: Principal | ICD-10-CM | POA: Diagnosis present

## 2019-02-02 DIAGNOSIS — I2510 Atherosclerotic heart disease of native coronary artery without angina pectoris: Secondary | ICD-10-CM | POA: Diagnosis present

## 2019-02-02 DIAGNOSIS — N138 Other obstructive and reflux uropathy: Secondary | ICD-10-CM | POA: Diagnosis present

## 2019-02-02 DIAGNOSIS — R5381 Other malaise: Secondary | ICD-10-CM | POA: Diagnosis present

## 2019-02-02 DIAGNOSIS — R740 Nonspecific elevation of levels of transaminase and lactic acid dehydrogenase [LDH]: Secondary | ICD-10-CM | POA: Diagnosis present

## 2019-02-02 DIAGNOSIS — Z7951 Long term (current) use of inhaled steroids: Secondary | ICD-10-CM

## 2019-02-02 DIAGNOSIS — K219 Gastro-esophageal reflux disease without esophagitis: Secondary | ICD-10-CM | POA: Diagnosis present

## 2019-02-02 DIAGNOSIS — F419 Anxiety disorder, unspecified: Secondary | ICD-10-CM | POA: Diagnosis present

## 2019-02-02 LAB — PROTIME-INR
INR: 1.2 (ref 0.8–1.2)
Prothrombin Time: 14.6 seconds (ref 11.4–15.2)

## 2019-02-02 LAB — RETICULOCYTES
Immature Retic Fract: 15.1 % (ref 2.3–15.9)
RBC.: 2.3 MIL/uL — ABNORMAL LOW (ref 4.22–5.81)
Retic Count, Absolute: 5.8 10*3/uL — ABNORMAL LOW (ref 19.0–186.0)
Retic Ct Pct: 0.3 % — ABNORMAL LOW (ref 0.4–3.1)

## 2019-02-02 LAB — COMPREHENSIVE METABOLIC PANEL
ALT: 12 U/L (ref 0–44)
AST: 62 U/L — ABNORMAL HIGH (ref 15–41)
Albumin: 3 g/dL — ABNORMAL LOW (ref 3.5–5.0)
Alkaline Phosphatase: 41 U/L (ref 38–126)
Anion gap: 13 (ref 5–15)
BUN: 20 mg/dL (ref 8–23)
CO2: 20 mmol/L — ABNORMAL LOW (ref 22–32)
Calcium: 8.1 mg/dL — ABNORMAL LOW (ref 8.9–10.3)
Chloride: 109 mmol/L (ref 98–111)
Creatinine, Ser: 1.69 mg/dL — ABNORMAL HIGH (ref 0.61–1.24)
GFR calc Af Amer: 43 mL/min — ABNORMAL LOW (ref >60)
GFR calc non Af Amer: 38 mL/min — ABNORMAL LOW (ref >60)
Glucose, Bld: 94 mg/dL (ref 70–99)
Potassium: 4.1 mmol/L (ref 3.5–5.1)
Sodium: 142 mmol/L (ref 135–145)
Total Bilirubin: 0.4 mg/dL (ref 0.3–1.2)
Total Protein: 6.7 g/dL (ref 6.5–8.1)

## 2019-02-02 LAB — FOLATE: Folate: 21.3 ng/mL (ref >5.9)

## 2019-02-02 LAB — SAVE SMEAR(SSMR), FOR PROVIDER SLIDE REVIEW

## 2019-02-02 LAB — APTT: aPTT: 27 seconds (ref 24–36)

## 2019-02-02 LAB — LACTATE DEHYDROGENASE: LDH: 2354 U/L — ABNORMAL HIGH (ref 98–192)

## 2019-02-02 MED ORDER — SIMVASTATIN 40 MG PO TABS
40.0000 mg | ORAL_TABLET | ORAL | Status: DC
  Administered 2019-02-02 – 2019-02-06 (×3): 40 mg via ORAL
  Filled 2019-02-02 (×3): qty 1

## 2019-02-02 MED ORDER — ACETAMINOPHEN 650 MG RE SUPP: 650.0000 mg | Freq: Four times a day (QID) | RECTAL | Status: DC | PRN

## 2019-02-02 MED ORDER — ACETAMINOPHEN 325 MG PO TABS: 650.0000 mg | ORAL_TABLET | Freq: Four times a day (QID) | ORAL | Status: DC | PRN

## 2019-02-02 MED ORDER — BUPROPION HCL ER (XL) 150 MG PO TB24
150.0000 mg | ORAL_TABLET | Freq: Every day | ORAL | Status: DC
  Administered 2019-02-02 – 2019-02-06 (×5): 150 mg via ORAL
  Filled 2019-02-02 (×5): qty 1

## 2019-02-02 MED ORDER — UMECLIDINIUM BROMIDE 62.5 MCG/INH IN AEPB
1.0000 | INHALATION_SPRAY | Freq: Every day | RESPIRATORY_TRACT | Status: DC
  Administered 2019-02-03 – 2019-02-06 (×4): 1 via RESPIRATORY_TRACT
  Filled 2019-02-02: qty 7

## 2019-02-02 MED ORDER — VITAMIN B-12 1000 MCG PO TABS
5000.0000 ug | ORAL_TABLET | Freq: Every day | ORAL | Status: DC
  Administered 2019-02-02 – 2019-02-06 (×5): 5000 ug via ORAL
  Filled 2019-02-02 (×5): qty 5

## 2019-02-02 MED ORDER — PREDNISONE 20 MG PO TABS
20.0000 mg | ORAL_TABLET | Freq: Every day | ORAL | Status: DC
  Administered 2019-02-03 – 2019-02-06 (×3): 20 mg via ORAL
  Filled 2019-02-02 (×3): qty 1

## 2019-02-02 MED ORDER — SERTRALINE HCL 100 MG PO TABS
100.0000 mg | ORAL_TABLET | Freq: Every day | ORAL | Status: DC
  Administered 2019-02-03 – 2019-02-06 (×4): 100 mg via ORAL
  Filled 2019-02-02 (×4): qty 1

## 2019-02-02 MED ORDER — MOMETASONE FURO-FORMOTEROL FUM 200-5 MCG/ACT IN AERO
2.0000 | INHALATION_SPRAY | Freq: Two times a day (BID) | RESPIRATORY_TRACT | Status: DC
  Administered 2019-02-02 – 2019-02-06 (×7): 2 via RESPIRATORY_TRACT
  Filled 2019-02-02: qty 8.8

## 2019-02-02 MED ORDER — LATANOPROST 0.005 % OP SOLN
1.0000 [drp] | Freq: Every day | OPHTHALMIC | Status: DC
  Administered 2019-02-02 – 2019-02-05 (×4): 1 [drp] via OPHTHALMIC
  Filled 2019-02-02: qty 2.5

## 2019-02-02 MED ORDER — PANTOPRAZOLE SODIUM 40 MG PO TBEC
40.0000 mg | DELAYED_RELEASE_TABLET | Freq: Every day | ORAL | Status: DC
  Administered 2019-02-03 – 2019-02-06 (×4): 40 mg via ORAL
  Filled 2019-02-02 (×5): qty 1

## 2019-02-02 MED ORDER — SODIUM CHLORIDE 0.9 % IV SOLN
10.00 | INTRAVENOUS | Status: DC
Start: ? — End: 2019-02-02

## 2019-02-02 MED ORDER — SODIUM CHLORIDE 0.9 % IV SOLN
INTRAVENOUS | Status: AC
  Administered 2019-02-02: via INTRAVENOUS

## 2019-02-02 MED ORDER — GABAPENTIN 300 MG PO CAPS
300.0000 mg | ORAL_CAPSULE | Freq: Every day | ORAL | Status: DC
  Administered 2019-02-02 – 2019-02-06 (×5): 300 mg via ORAL
  Filled 2019-02-02 (×5): qty 1

## 2019-02-02 MED ORDER — LORAZEPAM 0.5 MG PO TABS: 0.5000 mg | ORAL_TABLET | Freq: Four times a day (QID) | ORAL | Status: DC | PRN

## 2019-02-02 MED ORDER — ALBUTEROL SULFATE (2.5 MG/3ML) 0.083% IN NEBU: 2.5000 mg | INHALATION_SOLUTION | Freq: Four times a day (QID) | RESPIRATORY_TRACT | Status: DC | PRN

## 2019-02-02 NOTE — H&P (Signed)
History and Physical    Anthony [name] [id number] DOB: 1941/01/10 DOA: 02/02/2019  PCP: Orpah Melter, MD  Patient coming from: [facility]  I have personally briefly reviewed patient's old medical records in [place]  Chief Complaint: Generalized weakness  HPI: Anthony [name] is a 78 y.o. 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  Patient states he has 5 days of progressive generalized weakness and poor oral intake.  He says he has had low energy and has not really eaten anything for about 5 days.  He says he has had one medication change but is not sure what was added.  He reports occasional shortness of breath which he attributes to his COPD.  He has an indwelling Foley catheter which he says he has not had any issues with and continues to make good urine.  He reports chronic and easy bruising on both of his arms.  He takes Plavix for CAD but denies any obvious bleeding including epistaxis, hemoptysis, hematemesis, hematuria, or hematochezia.  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  He otherwise denies any subjective fevers, chills, diaphoresis, chest pain, abdominal pain, nausea, vomiting, or diarrhea.  [facility] ED Course:  Vitals showed BP 118/59, pulse 99, RR 16, SPO2 92%, temp 98.6 Fahrenheit.  Labs notable for WBC 20.1, hemoglobin 6.3, hematocrit 19.5, MCV 119, platelets 12,000, 6% neutrophils, 12% lymphocytes, 18% monocytes, 14% blasts.  Sodium 136, potassium 3.9, bicarb 22, BUN 20, creatinine 1.88, EGFR 33.  Urinalysis showed positive nitrites, 500 leukocyte esterase, 40-50 RBC/hpf, greater than 50 WBC/hpf, and  2+ bacteria on microscopy.  Urine culture was obtained and pending.  FOBT was negative.  Chest x-ray report in care everywhere showed stable chronic changes of interstitial inflammation in the lung bases without acute infiltrates or effusions.  Patient was given IV ceftriaxone and reportedly received transfusion of 1 unit PRBC and 1 unit of platelets.  The hospitalist service was consulted for transfer to [facility] for further evaluation and management.  Review of Systems: All systems reviewed and are negative except as documented in history of present illness above.   Past Medical History:  Diagnosis Date  . Chronic kidney disease 2015  . Coronary atherosclerosis of native coronary artery   . Depressive disorder   . HTN (hypertension)   . Hyperlipidemia   . Iron deficiency anemia, unspecified 01/15/2014  . LPRD (laryngopharyngeal reflux disease)   . Myelodysplasia, low grade ([place]) 01/15/2014    Past Surgical History:  Procedure Laterality Date  . ANGIOPLASTY    . BONE MARROW BIOPSY  2016  . COLONOSCOPY    . EYE SURGERY     cataract  . implanted port Right 04/2015  . TONSILLECTOMY     as a child    Social History:  reports that he quit smoking about 20 years ago. His smoking use included cigarettes. He started smoking about 59 years ago. He has a 80.00 pack-year smoking history. He has never used smokeless tobacco. He reports current alcohol use. He reports that he does not use drugs.  No Known Allergies  Family History  Problem Relation Age of Onset  . Heart disease Father   . Hypertension Father   . Heart disease Mother  Prior to Admission medications   Medication Sig Start Date End Date Taking? Authorizing Provider  albuterol (PROVENTIL) (2.5 MG/3ML) 0.083% nebulizer solution Take 3 mLs (2.5 mg total) by nebulization every 6 (six) hours as needed for wheezing or shortness of breath. 02/12/18   Anthony Doom, MD  Albuterol Sulfate (PROAIR RESPICLICK)  749 (90 BASE) MCG/ACT AEPB Inhale 2 puffs into the lungs every 6 (six) hours as needed.    [provider]  bimatoprost (LUMIGAN) 0.01 % SOLN Place 1 drop into both eyes at bedtime.    [provider]  budesonide-formoterol (SYMBICORT) 160-4.5 MCG/ACT inhaler Inhale 2 puffs into the lungs 2 (two) times daily. 11/12/14   Kathee Delton, MD  buPROPion (WELLBUTRIN XL) 150 MG 24 hr tablet Take 150 mg by mouth daily.    [provider]  Calcium Citrate (CITRACAL PO) Take 1,000 Units by mouth 2 (two) times daily.     [provider]  Cholecalciferol (VITAMIN D3) 5000 UNITS TABS Take by mouth daily.    [provider]  clopidogrel (PLAVIX) 75 MG tablet Take 75 mg by mouth daily.  04/16/12   [provider]  co-enzyme Q-10 30 MG capsule Take 100 mg by mouth daily.    [provider]  Cyanocobalamin (VITAMIN B-12) 2500 MCG TABS Take 5,000 mcg by mouth daily. 06/16/14   Volanda Napoleon, MD  fish oil-omega-3 fatty acids 1000 MG capsule Take 1 g by mouth daily.    [provider]  gabapentin (NEURONTIN) 300 MG capsule Take 300 mg by mouth daily. 12/16/17   [provider]  glucosamine-chondroitin 500-400 MG tablet Take 1 tablet by mouth daily.    [provider]  HYDROcodone-acetaminophen (NORCO) 7.5-325 MG tablet TAKE 1 TABLET BY MOUTH AS NEEDED EVERY 6 HOURS FOR 7 DAYS 12/11/17   [provider]  levofloxacin (LEVAQUIN) 500 MG tablet Take 1 tablet (500 mg total) by mouth daily. 04/07/18   Volanda Napoleon, MD  LORazepam (ATIVAN) 0.5 MG tablet TAKE 1 TABLET BY MOUTH EVERY 6 HOURS AS NEEDED FOR NAUSEA OR VOMITING 12/29/18   Volanda Napoleon, MD  magnesium oxide (MAG-OX) 400 MG tablet Take 400 mg by mouth daily.    [provider]  Menthol, Topical Analgesic, 2.5 % GEL Apply topically.    [provider]  pantoprazole (PROTONIX) 40 MG tablet Take 40 mg by mouth daily. 04/20/15   [provider]   predniSONE (DELTASONE) 20 MG tablet TAKE 1 TABLET BY MOUTH ONCE DAILY WITH BREAKFAST 12/09/18   Volanda Napoleon, MD  prochlorperazine (COMPAZINE) 10 MG tablet Take 1 tablet (10 mg total) by mouth every 6 (six) hours as needed (Nausea or vomiting). 11/11/17   Volanda Napoleon, MD  ranitidine (ZANTAC) 300 MG tablet Take 300 mg by mouth at bedtime.  05/26/12   [provider]  sertraline (ZOLOFT) 100 MG tablet Take 100 mg by mouth daily. 11/16/14   [provider]  SIMBRINZA 1-0.2 % SUSP  05/20/15   [provider]  simvastatin (ZOCOR) 40 MG tablet Take 40 mg by mouth every other day.  03/25/12   [provider]  tamsulosin (FLOMAX) 0.4 MG CAPS capsule  12/25/16   [provider]  tiotropium (SPIRIVA) 18 MCG inhalation capsule Place 18 mcg into inhaler and inhale daily.    [provider]  triamcinolone cream (KENALOG) 0.1 % APPLY TO RASH/ITCH ON ARMS TWICE DAILY AS NEEDED 10/23/16   [provider]  VENCLEXTA 100 MG TABS TAKE 400 MG(4 TABLETS) BY MOUTH DAILY. TAKE AS DIRECTED FOR 30 DAYS 09/08/18   Volanda Napoleon, MD    Physical Exam: Vitals:   02/02/19 2226  SpO2: 96%   Constitutional: Elderly man resting supine in bed, NAD, calm, comfortable Eyes: PERRL, lids and conjunctivae normal ENMT: Mucous membranes are moist. Posterior pharynx clear of any exudate or lesions.Normal dentition.  Neck: normal, supple, no masses. Respiratory: clear to auscultation bilaterally, no wheezing, no crackles. Normal respiratory effort. No accessory muscle use.  Cardiovascular: Regular rate and rhythm, no murmurs / rubs / gallops. No extremity edema. 2+ pedal pulses. Abdomen: no tenderness, no masses palpated. No hepatosplenomegaly. Bowel sounds positive.  GU: Foley catheter in place with approximately 100 cc clear yellow urine in bag Musculoskeletal: no clubbing / cyanosis. No joint deformity upper and lower extremities. Good ROM, no contractures. Normal  muscle tone.  Skin: Multiple ecchymosis and petechial rash on bilateral upper extremities and chest wall, chronic per patient Neurologic: CN 2-12 grossly intact. Sensation intact, Strength 5/5 in all 4.  Psychiatric: Normal judgment and insight. Alert and oriented x 3. Normal mood.     Labs on Admission: I have personally reviewed following labs and imaging studies  CBC: Recent Labs  Lab 02/02/19 2209  WBC 18.2*  NEUTROABS 10.4*  HGB 8.3*  HCT 26.0*  MCV 113.0*  PLT 55*   Basic Metabolic Panel: Recent Labs  Lab 02/02/19 2209  NA 142  K 4.1  CL 109  CO2 20*  GLUCOSE 94  BUN 20  CREATININE 1.69*  CALCIUM 8.1*   GFR: CrCl cannot be calculated (Unknown ideal weight.). Liver Function Tests: Recent Labs  Lab 02/02/19 2209  AST 62*  ALT 12  ALKPHOS 41  BILITOT 0.4  PROT 6.7  ALBUMIN 3.0*   No results for input(s): LIPASE, AMYLASE in the last 168 hours. No results for input(s): AMMONIA in the last 168 hours. Coagulation Profile: Recent Labs  Lab 02/02/19 2209  INR 1.2   Cardiac Enzymes: No results for input(s): CKTOTAL, CKMB, CKMBINDEX, TROPONINI in the last 168 hours. BNP (last 3 results) Recent Labs    02/05/18 1528  PROBNP 58.0   HbA1C: No results for input(s): HGBA1C in the last 72 hours. CBG: No results for input(s): GLUCAP in the last 168 hours. Lipid Profile: No results for input(s): CHOL, HDL, LDLCALC, TRIG, CHOLHDL, LDLDIRECT in the last 72 hours. Thyroid Function Tests: No results for input(s): TSH, T4TOTAL, FREET4, T3FREE, THYROIDAB in the last 72 hours. Anemia Panel: Recent Labs    02/02/19 2209  FOLATE 21.3  RETICCTPCT 0.3*   Urine analysis: No results found for: COLORURINE, APPEARANCEUR, LABSPEC, PHURINE, GLUCOSEU, HGBUR, BILIRUBINUR, KETONESUR, PROTEINUR, UROBILINOGEN, NITRITE, LEUKOCYTESUR  Radiological Exams on Admission: No results found.  EKG: Not performed.  Assessment/Plan Principal Problem:   Thrombocytopenia ([place])  Active Problems:   COPD (chronic obstructive pulmonary disease) (HCC)   Essential hypertension   Hyperlipidemia   CAD (coronary artery disease), native coronary artery   Anemia   Myelodysplasia, low grade ([place])   Urinary retention due to benign prostatic hyperplasia   Indwelling Foley catheter present  HEYWOOD TOKUNAGA is a 78 y.o. 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   Generalized weakness due to symptomatic anemia and poor oral intake:  Received transfusion of 1 unit PRBC and platelets at outside hospital.  Repeat hemoglobin up to 8.3 from 6.3.  FOBT was negative and there is no obvious sign of bleeding. -Continue to monitor hemoglobin and signs/symptoms of bleeding and transfuse as needed -Check anemia labs -Holding home Plavix -Request PT/OT eval  Thrombocytopenia: Platelets improved to 55,000 from 12,000 at outside hospital after platelet transfusion.  Will hold off on further platelet transfusion at this time.  Hold home Plavix.  Myelodysplasia and refractory anemia with excess blasts: Follows with oncology, Dr. Marin Olp.  He is on venetoclax.  Hemoglobin and platelets improved with transfusion at outside hospital. -Will hold venetoclax for now, will need oncology input in a.m. -Check anemia panel, LDH, reticulocyte, save smear -Continue home prednisone 20 mg daily -Monitor CBC with differential  Acute on chronic stage III kidney injury: Likely prerenal from poor p.o. intake and anemia.  Will provide maintenance IV fluids overnight and repeat labs in a.m.  BPH with urinary retention/outlet obstruction and chronic indwelling Foley catheter: Catheter last changed 01/28/2019 by [facility] health urology in  [place].  Urinalysis at outside hospital showed bacteriuria. He was given IV ceftriaxone in the ED.  Will obtain urine culture and consider resuming IV antibiotics if the true UTI versus asymptomatic bacteriuria.  Hypertension: Not currently on antihypertensives.  Continue to monitor.  Hyperlipidemia: Continue simvastatin.  CAD: Denies any chest pain.  Continue statin, holding Plavix as above.  COPD: Currently stable.  Continue Dulera, Incruse, and as needed albuterol.  Depression/anxiety: Continue sertraline, bupropion, and as needed Ativan.  DVT prophylaxis: SCDs Code Status: DNR, confirmed with patient Family Communication: Discussed with patient he has discussed with family Disposition Plan: Pending clinical progress Consults called: None Admission status: Observation   Zada Finders MD [facility]  If 7PM-7AM, please contact night-coverage [email or web address]  02/02/2019, 11:42 PM

## 2019-02-03 ENCOUNTER — Encounter (HOSPITAL_COMMUNITY): Payer: Self-pay | Admitting: *Deleted

## 2019-02-03 ENCOUNTER — Other Ambulatory Visit: Payer: Self-pay

## 2019-02-03 DIAGNOSIS — Z8249 Family history of ischemic heart disease and other diseases of the circulatory system: Secondary | ICD-10-CM | POA: Diagnosis not present

## 2019-02-03 DIAGNOSIS — R531 Weakness: Secondary | ICD-10-CM | POA: Diagnosis present

## 2019-02-03 DIAGNOSIS — Z7902 Long term (current) use of antithrombotics/antiplatelets: Secondary | ICD-10-CM | POA: Diagnosis not present

## 2019-02-03 DIAGNOSIS — D696 Thrombocytopenia, unspecified: Secondary | ICD-10-CM

## 2019-02-03 DIAGNOSIS — K219 Gastro-esophageal reflux disease without esophagitis: Secondary | ICD-10-CM | POA: Diagnosis present

## 2019-02-03 DIAGNOSIS — N138 Other obstructive and reflux uropathy: Secondary | ICD-10-CM | POA: Diagnosis present

## 2019-02-03 DIAGNOSIS — D4620 Refractory anemia with excess of blasts, unspecified: Secondary | ICD-10-CM | POA: Diagnosis not present

## 2019-02-03 DIAGNOSIS — D469 Myelodysplastic syndrome, unspecified: Secondary | ICD-10-CM

## 2019-02-03 DIAGNOSIS — D61818 Other pancytopenia: Secondary | ICD-10-CM | POA: Diagnosis present

## 2019-02-03 DIAGNOSIS — N179 Acute kidney failure, unspecified: Secondary | ICD-10-CM | POA: Diagnosis present

## 2019-02-03 DIAGNOSIS — E7800 Pure hypercholesterolemia, unspecified: Secondary | ICD-10-CM | POA: Diagnosis not present

## 2019-02-03 DIAGNOSIS — F419 Anxiety disorder, unspecified: Secondary | ICD-10-CM | POA: Diagnosis present

## 2019-02-03 DIAGNOSIS — J410 Simple chronic bronchitis: Secondary | ICD-10-CM | POA: Diagnosis not present

## 2019-02-03 DIAGNOSIS — N183 Chronic kidney disease, stage 3 (moderate): Secondary | ICD-10-CM | POA: Diagnosis present

## 2019-02-03 DIAGNOSIS — I10 Essential (primary) hypertension: Secondary | ICD-10-CM | POA: Diagnosis not present

## 2019-02-03 DIAGNOSIS — J449 Chronic obstructive pulmonary disease, unspecified: Secondary | ICD-10-CM | POA: Diagnosis present

## 2019-02-03 DIAGNOSIS — C9200 Acute myeloblastic leukemia, not having achieved remission: Secondary | ICD-10-CM | POA: Diagnosis present

## 2019-02-03 DIAGNOSIS — R6510 Systemic inflammatory response syndrome (SIRS) of non-infectious origin without acute organ dysfunction: Secondary | ICD-10-CM

## 2019-02-03 DIAGNOSIS — Z87891 Personal history of nicotine dependence: Secondary | ICD-10-CM | POA: Diagnosis not present

## 2019-02-03 DIAGNOSIS — Z66 Do not resuscitate: Secondary | ICD-10-CM | POA: Diagnosis present

## 2019-02-03 DIAGNOSIS — Z20828 Contact with and (suspected) exposure to other viral communicable diseases: Secondary | ICD-10-CM | POA: Diagnosis present

## 2019-02-03 DIAGNOSIS — N390 Urinary tract infection, site not specified: Secondary | ICD-10-CM | POA: Diagnosis present

## 2019-02-03 DIAGNOSIS — I129 Hypertensive chronic kidney disease with stage 1 through stage 4 chronic kidney disease, or unspecified chronic kidney disease: Secondary | ICD-10-CM | POA: Diagnosis present

## 2019-02-03 DIAGNOSIS — R338 Other retention of urine: Secondary | ICD-10-CM | POA: Diagnosis present

## 2019-02-03 DIAGNOSIS — Z7951 Long term (current) use of inhaled steroids: Secondary | ICD-10-CM | POA: Diagnosis not present

## 2019-02-03 DIAGNOSIS — G629 Polyneuropathy, unspecified: Secondary | ICD-10-CM | POA: Diagnosis present

## 2019-02-03 DIAGNOSIS — D6189 Other specified aplastic anemias and other bone marrow failure syndromes: Secondary | ICD-10-CM

## 2019-02-03 DIAGNOSIS — E785 Hyperlipidemia, unspecified: Secondary | ICD-10-CM | POA: Diagnosis present

## 2019-02-03 DIAGNOSIS — N401 Enlarged prostate with lower urinary tract symptoms: Secondary | ICD-10-CM | POA: Diagnosis present

## 2019-02-03 DIAGNOSIS — F329 Major depressive disorder, single episode, unspecified: Secondary | ICD-10-CM | POA: Diagnosis present

## 2019-02-03 DIAGNOSIS — I2510 Atherosclerotic heart disease of native coronary artery without angina pectoris: Secondary | ICD-10-CM | POA: Diagnosis present

## 2019-02-03 LAB — URINALYSIS, ROUTINE W REFLEX MICROSCOPIC
Bilirubin Urine: NEGATIVE
Glucose, UA: NEGATIVE mg/dL
Ketones, ur: NEGATIVE mg/dL
Nitrite: NEGATIVE
Protein, ur: 30 mg/dL — AB
Specific Gravity, Urine: 1.01 (ref 1.005–1.030)
pH: 6 (ref 5.0–8.0)

## 2019-02-03 LAB — BASIC METABOLIC PANEL
Anion gap: 9 (ref 5–15)
BUN: 19 mg/dL (ref 8–23)
CO2: 21 mmol/L — ABNORMAL LOW (ref 22–32)
Calcium: 7.6 mg/dL — ABNORMAL LOW (ref 8.9–10.3)
Chloride: 109 mmol/L (ref 98–111)
Creatinine, Ser: 1.57 mg/dL — ABNORMAL HIGH (ref 0.61–1.24)
GFR calc Af Amer: 48 mL/min — ABNORMAL LOW (ref >60)
GFR calc non Af Amer: 41 mL/min — ABNORMAL LOW (ref >60)
Glucose, Bld: 98 mg/dL (ref 70–99)
Potassium: 3.5 mmol/L (ref 3.5–5.1)
Sodium: 139 mmol/L (ref 135–145)

## 2019-02-03 LAB — CBC WITH DIFFERENTIAL/PLATELET
Abs Immature Granulocytes: 2.07 10*3/uL — ABNORMAL HIGH (ref 0.00–0.07)
Abs Immature Granulocytes: 0.8 10*3/uL — ABNORMAL HIGH (ref 0.00–0.07)
Basophils Absolute: 0 10*3/uL (ref 0.0–0.1)
Basophils Absolute: 0 10*3/uL (ref 0.0–0.1)
Basophils Relative: 0 %
Basophils Relative: 0 %
Eosinophils Absolute: 0 10*3/uL (ref 0.0–0.5)
Eosinophils Absolute: 0 10*3/uL (ref 0.0–0.5)
Eosinophils Relative: 0 %
Eosinophils Relative: 0 %
HCT: 26 % — ABNORMAL LOW (ref 39.0–52.0)
HCT: 24.1 % — ABNORMAL LOW (ref 39.0–52.0)
Hemoglobin: 8.3 g/dL — ABNORMAL LOW (ref 13.0–17.0)
Hemoglobin: 7.6 g/dL — ABNORMAL LOW (ref 13.0–17.0)
Immature Granulocytes: 11 %
Lymphocytes Relative: 0 %
Lymphocytes Relative: 2 %
Lymphs Abs: 0 10*3/uL — ABNORMAL LOW (ref 0.7–4.0)
Lymphs Abs: 0.3 10*3/uL — ABNORMAL LOW (ref 0.7–4.0)
MCH: 36.1 pg — ABNORMAL HIGH (ref 26.0–34.0)
MCH: 35.5 pg — ABNORMAL HIGH (ref 26.0–34.0)
MCHC: 31.9 g/dL (ref 30.0–36.0)
MCHC: 31.5 g/dL (ref 30.0–36.0)
MCV: 113 fL — ABNORMAL HIGH (ref 80.0–100.0)
MCV: 112.6 fL — ABNORMAL HIGH (ref 80.0–100.0)
Monocytes Absolute: 6.4 10*3/uL — ABNORMAL HIGH (ref 0.1–1.0)
Monocytes Absolute: 5.1 10*3/uL — ABNORMAL HIGH (ref 0.1–1.0)
Monocytes Relative: 35 %
Monocytes Relative: 31 %
Myelocytes: 6 %
Myelocytes: 5 %
Neutro Abs: 2.7 10*3/uL (ref 1.7–7.7)
Neutro Abs: 1.7 10*3/uL (ref 1.7–7.7)
Neutrophils Relative %: 9 %
Neutrophils Relative %: 10 %
Other: 50 %
Other: 52 %
Platelets: 55 10*3/uL — ABNORMAL LOW (ref 150–400)
Platelets: 47 10*3/uL — ABNORMAL LOW (ref 150–400)
RBC: 2.3 MIL/uL — ABNORMAL LOW (ref 4.22–5.81)
RBC: 2.14 MIL/uL — ABNORMAL LOW (ref 4.22–5.81)
RDW: 20.3 % — ABNORMAL HIGH (ref 11.5–15.5)
RDW: 20.7 % — ABNORMAL HIGH (ref 11.5–15.5)
WBC: 18.2 10*3/uL — ABNORMAL HIGH (ref 4.0–10.5)
WBC: 16.6 10*3/uL — ABNORMAL HIGH (ref 4.0–10.5)
nRBC: 0 % (ref 0.0–0.2)
nRBC: 0 % (ref 0.0–0.2)

## 2019-02-03 LAB — IRON AND TIBC
Iron: 103 ug/dL (ref 45–182)
Saturation Ratios: 40 % — ABNORMAL HIGH (ref 17.9–39.5)
TIBC: 255 ug/dL (ref 250–450)
UIBC: 152 ug/dL

## 2019-02-03 LAB — SARS CORONAVIRUS 2 (TAT 6-24 HRS): SARS Coronavirus 2: NEGATIVE

## 2019-02-03 LAB — VITAMIN B12: Vitamin B-12: >7500 pg/mL — ABNORMAL HIGH (ref 180–914)

## 2019-02-03 LAB — FERRITIN: Ferritin: >7500 ng/mL — ABNORMAL HIGH (ref 24–336)

## 2019-02-03 LAB — PATHOLOGIST SMEAR REVIEW

## 2019-02-03 MED ORDER — SODIUM CHLORIDE 0.9 % IV SOLN
INTRAVENOUS | Status: AC
  Administered 2019-02-03 – 2019-02-04 (×2): via INTRAVENOUS

## 2019-02-03 MED ORDER — SODIUM CHLORIDE 0.9 % IV SOLN
1.0000 g | INTRAVENOUS | Status: DC
  Administered 2019-02-03: 1 g via INTRAVENOUS
  Filled 2019-02-03: qty 1
  Filled 2019-02-03: qty 10

## 2019-02-03 NOTE — Evaluation (Signed)
Physical Therapy Evaluation Patient Details Name: Anthony [name] MRN: [id number] DOB: 02-05-1941 Today's Date: 02/03/2019   History of Present Illness  Anthony [name] is a 78 y.o. 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 He was found to have thrombocytopenia and anemia.  Clinical Impression  Pt admitted with above diagnosis. Pt currently with functional limitations due to the deficits listed below (see PT Problem List). Pt will benefit from skilled PT to increase their independence and safety with mobility to allow discharge to the venue listed below.  Pt moving at MIN to MIN/guard for SPT into recliner.  He is currently receiving HHPT and recommend continuing with this.  [place] may be helpful as well.     Follow Up Recommendations Home health PT;Supervision for mobility/OOB    Equipment Recommendations  None recommended by PT    Recommendations for Other Services       Precautions / Restrictions Precautions Precautions: Fall Precaution Comments: h/o falls Restrictions Weight Bearing Restrictions: No      Mobility  Bed Mobility Overal bed mobility: Needs Assistance Bed Mobility: Supine to Sit     Supine to sit: Min guard;HOB elevated     General bed mobility comments: min/guard for safety.  He had dyspnea 2-3/4 with o2 sat 98% BP 137/61 and HR 102  Transfers Overall transfer level: Needs assistance Equipment used: Rolling walker (2 wheeled) Transfers: Sit to/from [facility] Sit to Stand: Min guard;Min assist Stand pivot transfers: Min guard;Min assist       General transfer comment: EOB slightly elevated to recliner. Extra time and effort, min A to  steady.  Ambulation/Gait             General Gait Details: deferred due to fatigue  Stairs            Wheelchair Mobility    Modified Rankin (Stroke Patients Only)       Balance Overall balance assessment: Needs assistance;History of Falls Sitting-balance support: Feet supported;Bilateral upper extremity supported;Single extremity supported Sitting balance-Leahy Scale: Fair Sitting balance - Comments: reporting some dizziness sitting EOB   Standing balance support: Bilateral upper extremity supported Standing balance-Leahy Scale: Poor Standing balance comment: rw at baseline for standing and pivoting                             Pertinent Vitals/Pain Pain Assessment: Faces Faces Pain Scale: Hurts little more Pain Location: grimacing with bed mobility Pain Descriptors / Indicators: Grimacing Pain Intervention(s): Limited activity within patient's tolerance;Monitored during session    Home Living Family/patient expects to be discharged to:: Private residence Living Arrangements: Spouse/significant other   Type of Home: House Home Access: Ramped entrance     Home Layout: Two level;Able to live on main level with bedroom/bathroom Home Equipment: Transport chair;Hospital bed;Walker - 2 wheels;Bedside commode      Prior Function Level of Independence: Needs assistance   Gait / Transfers Assistance Needed: Transfers into w/c with A the last 2 weeks, before that he could transfer I'ly. His walking has diminshed over the last few months.  ADL's / Homemaking Assistance Needed: Wife A with batheing.  Comments: Pt reports spouse has knee problems. No other consistent help available. May benefit from [facility] aide.  Hand Dominance        Extremity/Trunk Assessment   Upper Extremity Assessment Upper Extremity Assessment: Defer to OT evaluation    Lower Extremity Assessment Lower Extremity Assessment: Generalized weakness    Cervical / Trunk  Assessment Cervical / Trunk Assessment: Normal  Communication   Communication: HOH  Cognition Arousal/Alertness: Awake/alert Behavior During Therapy: Flat affect;WFL for tasks assessed/performed Overall Cognitive Status: Within Functional Limits for tasks assessed                                 General Comments: A&O, some slow processing, likely at baseline      General Comments General comments (skin integrity, edema, etc.): some dizziness, VSS    Exercises Other Exercises Other Exercises: encouraged ROM exercises for BUE. Could benefit from light theraband exercises soon   Assessment/Plan    PT Assessment Patient needs continued PT services  PT Problem List Decreased strength;Decreased activity tolerance;Decreased balance;Decreased mobility;Decreased knowledge of use of DME       PT Treatment Interventions DME instruction;Gait training;Functional mobility training;Therapeutic exercise;Therapeutic activities;Wheelchair mobility training    PT Goals (Current goals can be found in the Care Plan section)  Acute Rehab PT Goals Patient Stated Goal: feel better, return home to wife PT Goal Formulation: With patient Time For Goal Achievement: 02/17/19 Potential to Achieve Goals: Good    Frequency Min 3X/week   Barriers to discharge Decreased caregiver support has A from his wife only and he reports she has bad knees    Co-evaluation PT/OT/SLP Co-Evaluation/Treatment: Yes Reason for Co-Treatment: For patient/therapist safety;To address functional/ADL transfers PT goals addressed during session: Mobility/safety with mobility OT goals addressed during session: ADL's and self-care;Strengthening/ROM       AM-PAC PT "6 Clicks" Mobility  Outcome Measure Help needed turning from your back to your side while in a flat bed without using bedrails?: None Help needed moving from lying on your back to sitting on the side of a flat bed without using bedrails?: A  Little Help needed moving to and from a bed to a chair (including a wheelchair)?: A Little Help needed standing up from a chair using your arms (e.g., wheelchair or bedside chair)?: A Little Help needed to walk in hospital room?: A Lot Help needed climbing 3-5 steps with a railing? : A Lot 6 Click Score: 17    End of Session Equipment Utilized During Treatment: Gait belt Activity Tolerance: Patient limited by fatigue Patient left: in chair;with call bell/phone within reach;with chair alarm set Nurse Communication: Mobility status PT Visit Diagnosis: Unsteadiness on feet (R26.81);Muscle weakness (generalized) (M62.81);Other abnormalities of gait and mobility (R26.89)    Time: [id number] PT Time Calculation (min) (ACUTE ONLY): 27 min   Charges:   PT Evaluation $PT Eval Low Complexity: 1 Low          Wray Goehring L. Tamala Julian, [place] Pager [phone] 02/03/2019   Galen Manila 02/03/2019, 1:33 PM

## 2019-02-03 NOTE — Progress Notes (Signed)
PROGRESS NOTE                                                                                                                                                                                                             Patient Demographics:    Anthony [name], is a 78 y.o. 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 intake for almost a week.  He was found to have significant leukocytosis (WBC 20 K with lymphocytes, monocytes and 14% blasts), hemoglobin of 6.3, platelets of 12,000.  Blood work also showed acute kidney injury with BUN of 20 and creatinine of 1.88. UA was positive for UTI. FOBT was negative.  Patient given 1 g IV Rocephin, 1 unit PRBC and 1 unit of platelets and hospitalist transfer to [facility].    Subjective:   Patient reports feeling weak.  Assessment  & Plan :    Principal Problem: Pancytopenia HCC) Myelodysplasia and refractory anemia with excess blast. Markedly elevated WBC with elevated LDH, anemia and thrombocytopenia concerning for MDS with leukemic progression. Received 1 unit PRBC and platelets prior to admission.  Seen by Dr. Marin Olp.  Likely not hemolyzing since his reticulocyte's are low as well. Order for bone marrow biopsy. Poor prognosis if patient has progression to acute leukemia. Patient had not followed up with his oncologist in the past 5 months and reports that he  did not schedule I due to COVID-19. Continue home dose prednisone.  Holding venetoclax. Monitor CBC closely.  Active Problems: Acute kidney injury on chronic kidney disease stage III Suspect prerenal ATN with poor p.o. intake.  Continue IV fluids and monitor.  BPH with bladder outlet obstruction on chronic indwelling Foley Last catheter change was 1 week prior to admission (follows with urology at [facility]  UA prior to admission showed bacteriuria.  Urine culture sent and on empiric Rocephin.  Essential hypertension Not on  any medication.  Monitor  Hyperlipidemia Continue statin  Coronary artery disease On Plavix and statin.  Plavix on hold.  COPD Continue home inhalers  Anxiety and depression Continue home meds (bupropion, Zoloft)  Generalized weakness Seen by PT/OT and recommend home health with supervision    Patient status: Inpatient Patient presenting with severe pancytopenia (anemia and thrombocytopenia) with marked leukocytosis with significant blast concerning for acute leukemic process.  He also has SIRS with severe dehydration and ATN and failure to thrive and UTI.  Patient is at high risk for progression to acute leukemia with complication including severe anemia, thrombocytopenia with risk of acute blast crisis and DIC.  Also at risk of progressive worsening of renal function.  Patient needs to be monitored closely in an inpatient setting for greater than sign 2 midnight.  Code Status : DNR  Family Communication  : None.  Wife involved in care  Disposition Plan  : Pending hospital course  Barriers For Discharge : Active symptoms  Consults  : Oncology (Dr. Marin Olp)  Procedures  : Pending bone marrow biopsy  DVT Prophylaxis  : SCDs  Lab Results  Component Value Date   PLT 47 (L) 02/03/2019    Antibiotics  :   Anti-infectives (From admission, onward)   None        Objective:   Vitals:   02/03/19 0506 02/03/19 0517 02/03/19 0837 02/03/19  1409  BP: (!) 150/60   (!) 163/72  Pulse: 89   72  Resp: 18   18  Temp: 98.1 F (36.7 C)   98.4 F (36.9 C)  TempSrc: Oral   Oral  SpO2: 96%  92% 95%  Weight:  77.2 kg    Height:        Wt Readings from Last 3 Encounters:  02/03/19 77.2 kg  08/25/18 81.2 kg  07/28/18 80.7 kg     Intake/Output Summary (Last 24 hours) at 02/03/2019 1454 Last data filed at 02/03/2019 1344 Gross per 24 hour  Intake 240 ml  Output 1100 ml  Net -860 ml     Physical Exam  Gen: Elderly male, fatigued HEENT: Pallor present moist mucosa, supple neck Chest: clear b/l, no added sounds CVS: N S1&S2, no murmurs, rubs or gallop GI: soft, NT, ND, BS+, Foley draining clear urine Musculoskeletal: warm, no edema     Data Review:    CBC Recent Labs  Lab 02/02/19 2209 02/03/19 0435  WBC 18.2* 16.6*  HGB 8.3* 7.6*  HCT 26.0* 24.1*  PLT 55* 47*  MCV 113.0* 112.6*  MCH 36.1* 35.5*  MCHC 31.9 31.5  RDW 20.3* 20.7*  LYMPHSABS 0.0* 0.3*  MONOABS 6.4* 5.1*  EOSABS 0.0 0.0  BASOSABS 0.0 0.0    Chemistries  Recent Labs  Lab 02/02/19 2209 02/03/19 0435  NA 142 139  K 4.1 3.5  CL 109 109  CO2 20* 21*  GLUCOSE 94 98  BUN 20 19  CREATININE 1.69* 1.57*  CALCIUM 8.1* 7.6*  AST 62*  --   ALT 12  --   ALKPHOS 41  --   BILITOT 0.4  --    ------------------------------------------------------------------------------------------------------------------ No results for input(s): CHOL, HDL, LDLCALC, TRIG, CHOLHDL, LDLDIRECT in the last 72 hours.  No results found for: HGBA1C ------------------------------------------------------------------------------------------------------------------ No results for input(s): TSH, T4TOTAL, T3FREE, THYROIDAB in the last 72 hours.  Invalid input(s): FREET3 ------------------------------------------------------------------------------------------------------------------ Recent Labs    02/02/19 2209  VITAMINB12 >7,500*  FOLATE 21.3  FERRITIN >7,500*   TIBC 255  IRON 103  RETICCTPCT 0.3*    Coagulation profile Recent Labs  Lab 02/02/19 2209  INR 1.2    No results for input(s): DDIMER in the last 72 hours.  Cardiac Enzymes No results for input(s): CKMB, TROPONINI, MYOGLOBIN in the last 168 hours.  Invalid input(s): CK ------------------------------------------------------------------------------------------------------------------ No results found for: BNP  Inpatient Medications  Scheduled Meds: . buPROPion  150 mg Oral Daily  . gabapentin  300 mg Oral Daily  . latanoprost  1 drop Both Eyes QHS  . mometasone-formoterol  2 puff Inhalation BID  . pantoprazole  40 mg Oral Daily  . predniSONE  20 mg Oral Q breakfast  . sertraline  100 mg Oral Daily  . simvastatin  40 mg Oral QODAY  . umeclidinium bromide  1 puff Inhalation Daily  . vitamin B-12  5,000 mcg Oral Daily   Continuous Infusions: PRN Meds:.acetaminophen **OR** acetaminophen, albuterol, LORazepam  Micro Results Recent Results (from the past 240 hour(s))  SARS CORONAVIRUS 2 (TAT 6-12 HRS) Nasal Swab Urine, Catheterized     Status: None   Collection Time: 02/02/19  9:20 PM   Specimen: Urine, Catheterized; Nasal Swab  Result Value Ref Range Status   SARS Coronavirus 2 NEGATIVE NEGATIVE Final    Comment: (NOTE) SARS-CoV-2 target nucleic acids are NOT DETECTED. The SARS-CoV-2 RNA is generally detectable in upper and lower respiratory specimens during the acute phase of infection. Negative results do not preclude SARS-CoV-2 infection, do not rule out co-infections with other pathogens, and should not be used as the sole basis for treatment or other patient management decisions. Negative results must be combined with clinical observations, patient history, and epidemiological information. The expected result is Negative. Fact Sheet for Patients: SugarRoll.be Fact Sheet for Healthcare Providers:  https://www.woods-mathews.com/ This test is not yet approved or cleared by the [place] FDA and  has been authorized for detection and/or diagnosis of SARS-CoV-2 by FDA under an Emergency Use Authorization (EUA). This EUA will remain  in effect (meaning this test can be used) for the duration of the COVID-19 declaration under Section 56 4(b)(1) of the Act, 21 U.S.C. section 360bbb-3(b)(1), unless the authorization is terminated or revoked sooner. Performed at [facility], [place] [address], [place], [place] [address]     Radiology Reports No results found.  Time Spent in minutes  35   Fletcher Ostermiller M.D on 02/03/2019 at 2:54 PM  Between 7am to 7pm - Pager - [phone]  After 7pm go to [email or web address] - password [facility]  [facility] -  Office  [phone]

## 2019-02-03 NOTE — Evaluation (Addendum)
Occupational Therapy Evaluation Patient Details Name: Anthony [name] MRN: [id number] DOB: 04-22-1941 Today's Date: 02/03/2019    History of Present Illness Anthony [name] is a 78 y.o. male with medical history significant for myelodysplasia and refractory anemia with excess blasts on venetoclax, CAD, COPD, hypertension, hyperlipidemia, CKD stage III, BPH with outlet obstruction and indwelling Foley catheter, and anxiety/depression who presented to [place] [facility] for evaluation of generalized weakness. He was found to have thrombocytopenia and anemia.   Clinical Impression   Pt admitted with the above diagnoses and presents with below problem list. Pt will benefit from continued acute OT to address the below listed deficits and maximize independence with basic ADLs prior to d/c home. PTA pt was needing some assist with bathing, reports his spouse gives him a sponge bath and provides setup for dressing. Pt transfers self into w/c or onto [facility] at home using a walker. Pt is currently min guard to min A with stand-pivot transfers, mod A UB ADLs, max A for LB ADLs. Presents with decreased activity tolerance and generalized weakness. Pt's spouse has been sole person providing assist to pt. Feel pt would benefit from having a [place] aide to decrease caregiver burden. Pt reports spouse's knees "aren't too good".     Follow Up Recommendations  Home health OT;Supervision/Assistance - 24 hour ([place] aide )    Equipment Recommendations  None recommended by OT    Recommendations for Other Services       Precautions / Restrictions Precautions Precautions: Fall Precaution Comments: h/o falls Restrictions Weight Bearing Restrictions: No      Mobility Bed Mobility Overal bed mobility: Needs Assistance Bed Mobility: Supine to Sit     Supine to sit: Min guard;HOB elevated(HOB partially elevated)     General bed mobility comments: min guard for safety, no [place]  assist  Transfers Overall transfer level: Needs assistance Equipment used: Rolling walker (2 wheeled) Transfers: Sit to/from [facility] Sit to Stand: Min guard;Min assist Stand pivot transfers: Min guard;Min assist       General transfer comment: EOB slightly elevated to recliner. Extra time and effort, min A to steady.    Balance Overall balance assessment: Needs assistance;History of Falls Sitting-balance support: Feet supported;Bilateral upper extremity supported;Single extremity supported Sitting balance-Leahy Scale: Fair Sitting balance - Comments: reporting some dizziness sitting EOB   Standing balance support: Bilateral upper extremity supported Standing balance-Leahy Scale: Poor Standing balance comment: rw at baseline for standing and pivoting                           ADL either performed or assessed with clinical judgement   ADL Overall ADL's : Needs assistance/impaired Eating/Feeding: Set up;Sitting Eating/Feeding Details (indicate cue type and reason): assist to open soda can "that's how weak I am" Grooming: Moderate assistance;Sitting   Upper Body Bathing: Moderate assistance;Sitting   Lower Body Bathing: Maximal assistance;Sit to/from stand   Upper Body Dressing : Moderate assistance;Sitting   Lower Body Dressing: Maximal assistance;Sit to/from stand   Toilet Transfer: Min guard;Minimal Electronics engineer Details (indicate cue type and reason): simulated with EOB to recliner Toileting- Clothing Manipulation and Hygiene: Moderate assistance;Sit to/from Nurse, children's Details (indicate cue type and reason): sponge baths at baseline   General ADL Comments: Pt completed bed mobility, sat EOB a few minutes then stand-pivot to recliner. Assist to steady and for safety     Vision  Perception     Praxis      Pertinent Vitals/Pain Pain Assessment: Faces Faces Pain Scale: Hurts  little more Pain Location: grimacing with bed mobility Pain Descriptors / Indicators: Grimacing Pain Intervention(s): Limited activity within patient's tolerance;Monitored during session     Hand Dominance     Extremity/Trunk Assessment Upper Extremity Assessment Upper Extremity Assessment: Generalized weakness   Lower Extremity Assessment Lower Extremity Assessment: Generalized weakness;Defer to PT evaluation   Cervical / Trunk Assessment Cervical / Trunk Assessment: Normal   Communication Communication Communication: HOH   Cognition Arousal/Alertness: Awake/alert Behavior During Therapy: Flat affect;WFL for tasks assessed/performed Overall Cognitive Status: Within Functional Limits for tasks assessed                                 General Comments: A&O, some slow processing, likely at baseline   General Comments  VSS. Dizzy EOB which was alleviated but not completely resolved with time.     Exercises Exercises: Other exercises Other Exercises Other Exercises: encouraged ROM exercises for BUE. Could benefit from light theraband exercises soon   Shoulder Instructions      Home Living Family/patient expects to be discharged to:: Private residence Living Arrangements: Spouse/significant other   Type of Home: House Home Access: [place]: Two level;Able to live on main level with bedroom/bathroom               Home Equipment: Transport chair;Hospital bed;Walker - 2 wheels;Bedside commode          Prior Functioning/Environment Level of Independence: Needs assistance  Gait / Transfers Assistance Needed: Transfers into w/c with A the last 2 weeks, before that he could transfer I'ly ADL's / Homemaking Assistance Needed: Wife A with batheing.   Comments: Pt reports spouse has knee problems. No other consistent help available. May benefit from [facility] aide.        OT Problem List: Decreased strength;Decreased activity  tolerance;Impaired balance (sitting and/or standing);Decreased knowledge of use of DME or AE;Decreased knowledge of precautions;Pain      OT Treatment/Interventions: Self-care/ADL training;Therapeutic exercise;Energy conservation;DME and/or AE instruction;Therapeutic activities;Patient/family education;Balance training    OT Goals(Current goals can be found in the care plan section) Acute Rehab OT Goals Patient Stated Goal: feel better, return home to wife OT Goal Formulation: With patient Time For Goal Achievement: 02/17/19 Potential to Achieve Goals: Good  OT Frequency: Min 2X/week   Barriers to D/C:            Co-evaluation PT/OT/SLP Co-Evaluation/Treatment: Yes Reason for Co-Treatment: For patient/therapist safety;To address functional/ADL transfers   OT goals addressed during session: ADL's and self-care;Strengthening/ROM      AM-PAC OT "6 Clicks" Daily Activity     Outcome Measure Help from another person eating meals?: A Little Help from another person taking care of personal grooming?: A Little Help from another person toileting, which includes using toliet, bedpan, or urinal?: A Lot Help from another person bathing (including washing, rinsing, drying)?: A Lot Help from another person to put on and taking off regular upper body clothing?: A Little Help from another person to put on and taking off regular lower body clothing?: A Lot 6 Click Score: 15   End of Session Equipment Utilized During Treatment: Gait belt;Rolling walker Nurse Communication: Mobility status  Activity Tolerance: Patient limited by fatigue;Patient tolerated treatment well Patient left: in chair;with call bell/phone within reach;with chair alarm set  OT Visit Diagnosis: Unsteadiness on feet (R26.81);Muscle weakness (generalized) (M62.81);History of falling (Z91.81);Pain                Time: [id number] OT Time Calculation (min): 27 min Charges:  OT General Charges $OT Visit: 1 Visit OT  Evaluation $OT Eval Low Complexity: [place], OT Acute Rehabilitation Services Pager: [phone] Office: [phone]   Hortencia Pilar 02/03/2019, 12:55 PM

## 2019-02-03 NOTE — Consult Note (Signed)
Referral MD  Reason for Referral: Refractory anemia with excess blasts; marked anemia and thrombocytopenia; markedly elevated LDH  No chief complaint on file. : Patient really is not sure why he is in the hospital.  HPI: Mr. Lipkin is well-known to me.  He is a 78 year old white male.  He has a long history of myelodysplasia.  We have treated him in the past with various protocols.  He is actually done quite well.  He had a bone marrow biopsy done back in February which really looked quite good.  There was no blasts that were noted.  He had mild changes.    We last saw him back in March.  Since then, he has not come back to see [place].  I am not sure as to why.  He is post come back in April.  I guess the coronavirus cause problems for him.  He has iron overload.  This is from past transfusions.  From what he says, he just started feeling worse over the weekend.  He subsequently was brought to the emergency room at [facility].  There, he was found to be quite anemic and thrombocytopenic.  He was brought over to [facility] for admission.  Of note, back in March of this year, his white cell count 3.7.  Hemoglobin 11.7.  Platelet count 131,000.  LDH was 195.  I think he was transfused with 1 unit of platelets and 1 unit of blood.  His lab work when he got to [facility] showed a white cell count of 18.2.  Hemoglobin 8.3.  Platelet count 55,000.  His LDH was 2300.  His ferritin was over 7500.  Iron saturation was 40%.  His creatinine was 1.69.  Calcium 8.1.  He may have a urinary tract infection.  I clearly worry that he is progressed to acute myeloid leukemia.  I suspect he is probably going to need to have a bone marrow biopsy to be done.  He says he really has not eaten in about 7 days.  He just is not hungry.  Has had no diarrhea.  There is been no obvious bleeding.  He has had no rashes.  He has had no leg swelling.  Overall, his performance status is ECOG 2.   Past  Medical History:  Diagnosis Date  . Chronic kidney disease 2015  . Coronary atherosclerosis of native coronary artery   . Depressive disorder   . HTN (hypertension)   . Hyperlipidemia   . Iron deficiency anemia, unspecified 01/15/2014  . LPRD (laryngopharyngeal reflux disease)   . Myelodysplasia, low grade ([place]) 01/15/2014  :  Past Surgical History:  Procedure Laterality Date  . ANGIOPLASTY    . BONE MARROW BIOPSY  2016  . COLONOSCOPY    . EYE SURGERY     cataract  . implanted port Right 04/2015  . TONSILLECTOMY     as a child  :   Current Facility-Administered Medications:  .  0.9 %  sodium chloride infusion, , Intravenous, Continuous, Zada Finders R, MD, Last Rate: 100 mL/hr at 02/02/19 2337 .  acetaminophen (TYLENOL) tablet 650 mg, 650 mg, Oral, Q6H PRN **OR** acetaminophen (TYLENOL) suppository 650 mg, 650 mg, Rectal, Q6H PRN, Posey Pronto, Vishal R, MD .  albuterol (PROVENTIL) (2.5 MG/3ML) 0.083% nebulizer solution 2.5 mg, 2.5 mg, Nebulization, Q6H PRN, Posey Pronto, Vishal R, MD .  buPROPion (WELLBUTRIN XL) 24 hr tablet 150 mg, 150 mg, Oral, Daily, Zada Finders R, MD, 150 mg at 02/02/19 2303 .  gabapentin (NEURONTIN) capsule 300 mg, 300 mg, Oral, Daily, Zada Finders R, MD, 300 mg at 02/02/19 2303 .  latanoprost (XALATAN) 0.005 % ophthalmic solution 1 drop, 1 drop, Both Eyes, QHS, Patel, Vishal R, MD, 1 drop at 02/02/19 2304 .  LORazepam (ATIVAN) tablet 0.5 mg, 0.5 mg, Oral, Q6H PRN, Lenore Cordia, MD .  mometasone-formoterol (DULERA) 200-5 MCG/ACT inhaler 2 puff, 2 puff, Inhalation, BID, Lenore Cordia, MD, 2 puff at 02/02/19 2225 .  pantoprazole (PROTONIX) EC tablet 40 mg, 40 mg, Oral, Daily, Patel, Vishal R, MD .  predniSONE (DELTASONE) tablet 20 mg, 20 mg, Oral, Q breakfast, Patel, Vishal R, MD .  sertraline (ZOLOFT) tablet 100 mg, 100 mg, Oral, Daily, Patel, Vishal R, MD .  simvastatin (ZOCOR) tablet 40 mg, 40 mg, Oral, QODAY, Patel, Roxanne Mins R, MD, 40 mg at 02/02/19 2305 .   umeclidinium bromide (INCRUSE ELLIPTA) 62.5 MCG/INH 1 puff, 1 puff, Inhalation, Daily, Patel, Vishal R, MD .  vitamin B-12 (CYANOCOBALAMIN) tablet 5,000 mcg, 5,000 mcg, Oral, Daily, Zada Finders R, MD, 5,000 mcg at 02/02/19 2259  Facility-Administered Medications Ordered in Other Encounters:  .  0.9 %  sodium chloride infusion, , Intravenous, Once, [place], Sarah M, NP .  sodium chloride flush (NS) 0.9 % injection 10 mL, 10 mL, Intravenous, PRN, Volanda Napoleon, MD, 10 mL at 04/16/16 0946 .  sodium chloride flush (NS) 0.9 % injection 10 mL, 10 mL, Intracatheter, PRN, Volanda Napoleon, MD, 10 mL at 04/21/18 1105 .  sodium chloride flush (NS) 0.9 % injection 10 mL, 10 mL, Intravenous, PRN, [place], Holli Humbles, NP, 10 mL at 07/07/18 1218 .  sodium chloride flush (NS) 0.9 % injection 3 mL, 3 mL, Intravenous, Once PRN, [place], Holli Humbles, NP:  . buPROPion  150 mg Oral Daily  . gabapentin  300 mg Oral Daily  . latanoprost  1 drop Both Eyes QHS  . mometasone-formoterol  2 puff Inhalation BID  . pantoprazole  40 mg Oral Daily  . predniSONE  20 mg Oral Q breakfast  . sertraline  100 mg Oral Daily  . simvastatin  40 mg Oral QODAY  . umeclidinium bromide  1 puff Inhalation Daily  . vitamin B-12  5,000 mcg Oral Daily  :  No Known Allergies:  Family History  Problem Relation Age of Onset  . Heart disease Father   . Hypertension Father   . Heart disease Mother   :  Social History   Socioeconomic History  . Marital status: Married    Spouse name: Not on file  . Number of children: 2  . Years of education: Not on file  . Highest education level: Not on file  Occupational History  . Occupation: retired  Scientific laboratory technician  . Financial resource strain: Not on file  . Food insecurity    Worry: Not on file    Inability: Not on file  . Transportation needs    Medical: Not on file    Non-medical: Not on file  Tobacco Use  . Smoking status: Former Smoker    Packs/day: 2.00    Years:  40.00    Pack years: 80.00    Types: Cigarettes    Start date: 08/05/1959    Quit date: 06/11/1998    Years since quitting: 20.6  . Smokeless tobacco: Never Used  . Tobacco comment: quit smoking 15 years ago  Substance and Sexual Activity  . Alcohol use: Yes    Alcohol/week: 0.0 standard drinks  Comment: 4 cocktails qd  . Drug use: No  . Sexual activity: Not Currently  Lifestyle  . Physical activity    Days per week: Not on file    Minutes per session: Not on file  . Stress: Not on file  Relationships  . Social Herbalist on phone: Not on file    Gets together: Not on file    Attends religious service: Not on file    Active member of club or organization: Not on file    Attends meetings of clubs or organizations: Not on file    Relationship status: Not on file  . Intimate partner violence    Fear of current or ex partner: Not on file    Emotionally abused: Not on file    Physically abused: Not on file    Forced sexual activity: Not on file  Other Topics Concern  . Not on file  Social History Narrative  . Not on file  :  Review of Systems  Constitutional: Positive for malaise/fatigue.  HENT: Negative.   Eyes: Negative.   Respiratory: Positive for shortness of breath.   Cardiovascular: Negative.   Gastrointestinal: Positive for nausea.  Genitourinary: Negative.   Musculoskeletal: Positive for back pain.  Skin: Negative.   Neurological: Positive for weakness.  Endo/Heme/Allergies: Bruises/bleeds easily.  Psychiatric/Behavioral: Negative.      Exam: As above Patient Vitals for the past 24 hrs:  BP Temp Temp src Pulse Resp SpO2 Height Weight  02/03/19 0517 - - - - - - - 170 lb 3.1 oz (77.2 kg)  02/03/19 0506 (!) 150/60 98.1 F (36.7 C) Oral 89 18 96 % - -  02/02/19 2226 - - - - - 96 % - -  02/02/19 2045 (!) 145/64 99.5 F (37.5 C) Oral 84 18 94 % '5\' 9"'  (1.753 m) 169 lb 6.4 oz (76.8 kg)     Recent Labs    02/02/19 2209 02/03/19 0435  WBC 18.2*  16.6*  HGB 8.3* 7.6*  HCT 26.0* 24.1*  PLT 55* 47*   Recent Labs    02/02/19 2209 02/03/19 0435  NA 142 139  K 4.1 3.5  CL 109 109  CO2 20* 21*  GLUCOSE 94 98  BUN 20 19  CREATININE 1.69* 1.57*  CALCIUM 8.1* 7.6*    Blood smear review: Pending  Pathology: None    Assessment and Plan: Mr. Popov is an 78 year old white male.  He probably has a 3 or 4-year history of myelodysplasia.  He actually had done incredibly well with this until recently.  I am not sure if he still on the venetoclax.  I will have to talk to his wife about this.  Am still not sure why he is not seen his for 5 months.  This really is a problem.  We really need to have checked his blood counts.  Again, by the markedly elevated LDH, the fact that he is now thrombocytopenic with marked anemia, I have to believe that we are probably looking at leukemic progression.  His reticulocyte count is incredibly low so he is not hemolyzing.  I will have to get him set up with a bone marrow biopsy.  I really think this is the test of choice at this point.  We will have to see if he has a urine infection.  I know that he has had this in the past.  I think if he does have progression to acute leukemia, there really is very  little that we can do for him.  He does not have the best performance status.  He is not a candidate for aggressive intervention.  Again, I will have to talk to his wife to find out exactly what is been going on in the past 4 or 5 months.  We will follow him along.  I appreciate everybody's help with him on [place], MD  [place] [phone]

## 2019-02-03 NOTE — Consult Note (Signed)
Chief Complaint: Patient was seen in consultation today for bone marrow biopsy  Referring Physician(s): Dr. Burney Gauze  Supervising Physician: Jacqulynn Cadet  Patient Status: [facility] - In-pt  History of Present Illness: Anthony [name] is a 78 y.o. male with a past medical history significant for anxiety, depression, COPD, BPH with indwelling foley, CAD, HTN, HLD, CKD, anemia and myelodyplasia followed by Dr. Marin Olp who presented to [facility] ED yesterday for evaluation of worsening weakness and poor oral intake. He was noted to be significantly anemic (hgb 6.3) and thrombocytopenic (15,000) - he received 1 unit PRBCs and 1 unit platelets which improved his hgb to 8.3 and platelets to 55,000. He was transferred to [facility] for further evaluation and management. He was seen by Dr. Marin Olp today who has requested a bone marrow biopsy for further evaluation of possible acute leukemia.   Anthony [name] states he "feels as good as an old man can feel" today, he is sitting up in bed eating some breakfast - he says his appetite is a little better but he's not as hungry as he usually is, he also intermittently nauseous. He tells me that he had a bone marrow biopsy in February of this year and he had no trouble with the procedure. Per chart patient underwent bone marrow biopsy in IR on 07/15/18 with pathology results showing normocellular marrow with mild dyspoiesis. Patient states understanding of requested procedure and wishes to proceed.   Past Medical History:  Diagnosis Date  . Chronic kidney disease 2015  . Coronary atherosclerosis of native coronary artery   . Depressive disorder   . HTN (hypertension)   . Hyperlipidemia   . Iron deficiency anemia, unspecified 01/15/2014  . LPRD (laryngopharyngeal reflux disease)   . Myelodysplasia, low grade ([place]) 01/15/2014    Past Surgical History:  Procedure Laterality Date  . ANGIOPLASTY    . BONE MARROW BIOPSY  2016  . COLONOSCOPY     . EYE SURGERY     cataract  . implanted port Right 04/2015  . TONSILLECTOMY     as a child    Allergies: Patient has no known allergies.  Medications: Prior to Admission medications   Medication Sig Start Date End Date Taking? Authorizing Provider  albuterol (PROVENTIL) (2.5 MG/3ML) 0.083% nebulizer solution Take 3 mLs (2.5 mg total) by nebulization every 6 (six) hours as needed for wheezing or shortness of breath. 02/12/18  Yes McQuaid, Ronie Spies, MD  bimatoprost (LUMIGAN) 0.01 % SOLN Place 1 drop into both eyes at bedtime.   Yes [provider]  budesonide-formoterol (SYMBICORT) 160-4.5 MCG/ACT inhaler Inhale 2 puffs into the lungs 2 (two) times daily. 11/12/14  Yes Clance, Armando Reichert, MD  buPROPion (WELLBUTRIN XL) 150 MG 24 hr tablet Take 150 mg by mouth daily.   Yes [provider]  Calcium Citrate (CITRACAL PO) Take 1,000 Units by mouth 2 (two) times daily.    Yes [provider]  Cholecalciferol (VITAMIN D3) 5000 UNITS TABS Take 1 tablet by mouth daily.    Yes [provider]  co-enzyme Q-10 30 MG capsule Take 100 mg by mouth daily.   Yes [provider]  Cyanocobalamin (VITAMIN B-12) 2500 MCG TABS Take 5,000 mcg by mouth daily. 06/16/14  Yes Volanda Napoleon, MD  fish oil-omega-3 fatty acids 1000 MG capsule Take 1 g by mouth daily.   Yes [provider]  gabapentin (NEURONTIN) 300 MG capsule Take 300 mg by mouth daily. 12/16/17  Yes [provider]  glucosamine-chondroitin 500-400 MG tablet Take 1 tablet by mouth daily.   Yes [provider]  LORazepam (ATIVAN) 0.5 MG tablet TAKE 1 TABLET BY MOUTH EVERY 6 HOURS AS NEEDED FOR NAUSEA OR VOMITING Patient taking differently: Take 0.5 mg by mouth every 6 (six) hours as needed for anxiety.  12/29/18  Yes Volanda Napoleon, MD  pantoprazole (PROTONIX) 40 MG tablet Take 40 mg by mouth daily. 04/20/15  Yes [provider]  predniSONE (DELTASONE) 20 MG tablet TAKE 1 TABLET BY  MOUTH ONCE DAILY WITH BREAKFAST Patient taking differently: Take 20 mg by mouth daily with breakfast.  12/09/18  Yes Ennever, Rudell Cobb, MD  sertraline (ZOLOFT) 100 MG tablet Take 100 mg by mouth daily. 11/16/14  Yes [provider]  SIMBRINZA 1-0.2 % SUSP Place 1 drop into both eyes daily.  05/20/15  Yes [provider]  simvastatin (ZOCOR) 40 MG tablet Take 40 mg by mouth every other day.  03/25/12  Yes [provider]  tiotropium (SPIRIVA) 18 MCG inhalation capsule Place 18 mcg into inhaler and inhale daily.   Yes [provider]  triamcinolone cream (KENALOG) 0.1 % Apply 1 application topically 2 (two) times daily as needed (itching and redness).  10/23/16  Yes [provider]  VENCLEXTA 100 MG TABS TAKE 400 MG(4 TABLETS) BY MOUTH DAILY. TAKE AS DIRECTED FOR 30 DAYS Patient taking differently: TAKE 400 MG(4 TABLETS) BY MOUTH DAILY. TAKE AS DIRECTED FOR 30 DAYS 09/08/18  Yes Volanda Napoleon, MD  fluticasone (FLONASE) 50 MCG/ACT nasal spray Place 1 spray into both nostrils daily. 11/12/18   [provider]  levofloxacin (LEVAQUIN) 500 MG tablet Take 1 tablet (500 mg total) by mouth daily. Patient not taking: Reported on 02/02/2019 04/07/18   Volanda Napoleon, MD  prochlorperazine (COMPAZINE) 10 MG tablet Take 1 tablet (10 mg total) by mouth every 6 (six) hours as needed (Nausea or vomiting). Patient not taking: Reported on 02/02/2019 11/11/17   Volanda Napoleon, MD     Family History  Problem Relation Age of Onset  . Heart disease Father   . Hypertension Father   . Heart disease Mother     Social History   Socioeconomic History  . Marital status: Married    Spouse name: Not on file  . Number of children: 2  . Years of education: Not on file  . Highest education level: Not on file  Occupational History  . Occupation: retired  Scientific laboratory technician  . Financial resource strain: Not on file  . Food insecurity    Worry: Not on file    Inability: Not  on file  . Transportation needs    Medical: Not on file    Non-medical: Not on file  Tobacco Use  . Smoking status: Former Smoker    Packs/day: 2.00    Years: 40.00    Pack years: 80.00    Types: Cigarettes    Start date: 08/05/1959    Quit date: 06/11/1998    Years since quitting: 20.6  . Smokeless tobacco: Never Used  . Tobacco comment: quit smoking 15 years ago  Substance and Sexual Activity  . Alcohol use: Yes    Alcohol/week: 0.0 standard drinks    Comment: 4 cocktails qd  . Drug use: No  . Sexual activity: Not Currently  Lifestyle  . Physical activity    Days per week: Not on file    Minutes per session: Not on file  . Stress: Not  on file  Relationships  . Social Herbalist on phone: Not on file    Gets together: Not on file    Attends religious service: Not on file    Active member of club or organization: Not on file    Attends meetings of clubs or organizations: Not on file    Relationship status: Not on file  Other Topics Concern  . Not on file  Social History Narrative  . Not on file     Review of Systems: A 12 point ROS discussed and pertinent positives are indicated in the HPI above.  All other systems are negative.  Review of Systems  Constitutional: Positive for appetite change and fatigue. Negative for chills and fever.  Respiratory: Negative for cough and shortness of breath.   Cardiovascular: Negative for chest pain.  Gastrointestinal: Positive for nausea. Negative for abdominal pain, blood in stool, diarrhea and vomiting.  Genitourinary: Negative for hematuria.  Musculoskeletal: Negative for back pain.  Skin: Negative for rash and wound.  Neurological: Negative for dizziness and headaches.  Hematological: Bruises/bleeds easily.    Vital Signs: BP (!) 150/60 (BP Location: Left Arm)   Pulse 89   Temp 98.1 F (36.7 C) (Oral)   Resp 18   Ht _0  (1.753 m)   Wt 170 lb 3.1 oz (77.2 kg)   SpO2 92%   BMI 25.13 kg/m   Physical Exam  Vitals signs and nursing note reviewed.  Constitutional:      General: He is not in acute distress. HENT:     Head: Normocephalic.     Mouth/Throat:     Mouth: Mucous membranes are moist.     Pharynx: Oropharynx is clear. No oropharyngeal exudate or posterior oropharyngeal erythema.  Cardiovascular:     Rate and Rhythm: Normal rate and regular rhythm.  Pulmonary:     Effort: Pulmonary effort is normal.     Breath sounds: Normal breath sounds.  Abdominal:     General: Bowel sounds are normal. There is no distension.     Palpations: Abdomen is soft.     Tenderness: There is no abdominal tenderness.  Skin:    General: Skin is warm and dry.     Findings: Bruising present.  Neurological:     Mental Status: He is alert and oriented to person, place, and time.  Psychiatric:        Mood and Affect: Mood normal.        Behavior: Behavior normal.        Thought Content: Thought content normal.        Judgment: Judgment normal.      MD Evaluation Airway: WNL Heart: WNL Abdomen: WNL Chest/ Lungs: WNL ASA  Classification: 3 Mallampati/Airway Score: One   Imaging: No results found.  Labs:  CBC: Recent Labs    07/28/18 1300 08/25/18 1211 02/02/19 2209 02/03/19 0435  WBC 5.2 3.7* 18.2* 16.6*  HGB 9.9* 11.7* 8.3* 7.6*  HCT 31.1* 36.7* 26.0* 24.1*  PLT 193 131* 55* 47*    COAGS: Recent Labs    02/25/18 0719 07/15/18 0900 02/02/19 2209  INR 0.97 0.93 1.2  APTT  --   --  27    BMP: Recent Labs    07/28/18 1300 08/25/18 1211 02/02/19 2209 02/03/19 0435  NA 136 139 142 139  K 4.4 3.9 4.1 3.5  CL 103 106 109 109  CO2 24 24 20* 21*  GLUCOSE 191* 103* 94 98  BUN 20  _0 CALCIUM 8.4* 8.2* 8.1* 7.6*  CREATININE 1.26* 1.25* 1.69* 1.57*  GFRNONAA 54* 54* 38* 41*  GFRAA >60 >60 43* 48*    LIVER FUNCTION TESTS: Recent Labs    07/07/18 1100 07/28/18 1300 08/25/18 1211 02/02/19 2209  BILITOT 0.2* 0.3 0.2* 0.4  AST 13* 13* 14* 62*  ALT _1 ALKPHOS 45 58 37* 41  PROT 5.9* 6.1* 5.9* 6.7  ALBUMIN 3.6 3.8 3.8 3.0*    TUMOR MARKERS: No results for input(s): AFPTM, CEA, CA199, CHROMGRNA in the last 8760 hours.  Assessment and Plan:  78 y/o M with history of myelodysplasia followed by Dr. Marin Olp who is currently admitted for symptomatic anemia/poor PO intake. A request has been made for bone marrow biopsy to evaluate for acute leukemia.  Will plan to proceed with bone marrow biopsy on the morning of Thursday (8/27) -- I have placed orders for patient to be NPO after midnight on 8/27, continue to hold Plavix, pre-procedure labs ordered.  Risks and benefits discussed with the patient including, but not limited to bleeding, infection, damage to adjacent structures or low yield requiring additional tests.  All of the patient's questions were answered, patient is agreeable to proceed.  Consent signed and in chart.  Thank you for this interesting consult.  I greatly enjoyed meeting Anthony [name] and look forward to participating in their care.  A copy of this report was sent to the requesting provider on this date.  Electronically Signed: Joaquim Nam, PA-C 02/03/2019, 10:30 AM   I spent a total of 20 Minutes in face to face in clinical consultation, greater than 50% of which was counseling/coordinating care for bone marrow biopsy.

## 2019-02-04 DIAGNOSIS — E7800 Pure hypercholesterolemia, unspecified: Secondary | ICD-10-CM

## 2019-02-04 DIAGNOSIS — J410 Simple chronic bronchitis: Secondary | ICD-10-CM

## 2019-02-04 DIAGNOSIS — I10 Essential (primary) hypertension: Secondary | ICD-10-CM

## 2019-02-04 DIAGNOSIS — D4620 Refractory anemia with excess of blasts, unspecified: Secondary | ICD-10-CM

## 2019-02-04 LAB — URINE CULTURE: Culture: NO GROWTH

## 2019-02-04 LAB — CBC
HCT: 21.1 % — ABNORMAL LOW (ref 39.0–52.0)
Hemoglobin: 6.7 g/dL — CL (ref 13.0–17.0)
MCH: 35.6 pg — ABNORMAL HIGH (ref 26.0–34.0)
MCHC: 31.8 g/dL (ref 30.0–36.0)
MCV: 112.2 fL — ABNORMAL HIGH (ref 80.0–100.0)
Platelets: 34 10*3/uL — ABNORMAL LOW (ref 150–400)
RBC: 1.88 MIL/uL — ABNORMAL LOW (ref 4.22–5.81)
RDW: 19.9 % — ABNORMAL HIGH (ref 11.5–15.5)
WBC: 15.8 10*3/uL — ABNORMAL HIGH (ref 4.0–10.5)
nRBC: 0 % (ref 0.0–0.2)

## 2019-02-04 LAB — COMPREHENSIVE METABOLIC PANEL
ALT: 11 U/L (ref 0–44)
AST: 43 U/L — ABNORMAL HIGH (ref 15–41)
Albumin: 2.4 g/dL — ABNORMAL LOW (ref 3.5–5.0)
Alkaline Phosphatase: 28 U/L — ABNORMAL LOW (ref 38–126)
Anion gap: 7 (ref 5–15)
BUN: 17 mg/dL (ref 8–23)
CO2: 20 mmol/L — ABNORMAL LOW (ref 22–32)
Calcium: 7.5 mg/dL — ABNORMAL LOW (ref 8.9–10.3)
Chloride: 111 mmol/L (ref 98–111)
Creatinine, Ser: 1.29 mg/dL — ABNORMAL HIGH (ref 0.61–1.24)
GFR calc Af Amer: >60 mL/min (ref >60)
GFR calc non Af Amer: 52 mL/min — ABNORMAL LOW (ref >60)
Glucose, Bld: 95 mg/dL (ref 70–99)
Potassium: 3.6 mmol/L (ref 3.5–5.1)
Sodium: 138 mmol/L (ref 135–145)
Total Bilirubin: 0.5 mg/dL (ref 0.3–1.2)
Total Protein: 5.5 g/dL — ABNORMAL LOW (ref 6.5–8.1)

## 2019-02-04 LAB — PROTIME-INR
INR: 1.2 (ref 0.8–1.2)
Prothrombin Time: 14.7 seconds (ref 11.4–15.2)

## 2019-02-04 LAB — PREPARE RBC (CROSSMATCH)

## 2019-02-04 LAB — FIBRINOGEN: Fibrinogen: 694 mg/dL — ABNORMAL HIGH (ref 210–475)

## 2019-02-04 MED ORDER — SODIUM CHLORIDE 0.9% IV SOLUTION: Freq: Once | INTRAVENOUS | Status: DC

## 2019-02-04 MED ORDER — FUROSEMIDE 10 MG/ML IJ SOLN
40.0000 mg | Freq: Once | INTRAMUSCULAR | Status: AC
  Administered 2019-02-04: 40 mg via INTRAVENOUS

## 2019-02-04 MED ORDER — FUROSEMIDE 10 MG/ML IJ SOLN
40.0000 mg | Freq: Once | INTRAMUSCULAR | Status: DC
  Filled 2019-02-04: qty 4

## 2019-02-04 MED ORDER — FUROSEMIDE 10 MG/ML IJ SOLN
40.0000 mg | Freq: Once | INTRAMUSCULAR | Status: AC
  Administered 2019-02-04: 40 mg via INTRAVENOUS
  Filled 2019-02-04: qty 4

## 2019-02-04 NOTE — Progress Notes (Signed)
PT Cancellation Note  Patient Details Name: Anthony [name] MRN: [id number] DOB: [birth year]   Cancelled Treatment:    Reason Eval/Treat Not Completed: Patient at procedure or test/unavailable. Pt about to get unit of blood hung and starting lunch.  Pt is scheduled for biopsy tomorrow.  Will follow up as schedule permits.   Galen Manila 02/04/2019, 1:46 PM

## 2019-02-04 NOTE — Progress Notes (Signed)
[facility]                                                                              Patient Demographics  Anthony [name], is a 78 y.o. male, DOB - 25-Jan-1941, [id number]  Admit date - 02/02/2019   Admitting Physician Anthony Memos, DO  Outpatient Primary MD for the patient is Anthony Melter, MD  Outpatient specialists:   LOS - 1  days   Medical records reviewed and are as summarized below:    No chief complaint on file.      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 intake for almost a week. He was found to have significant leukocytosis (WBC 20 K with lymphocytes, monocytes and 14% blasts), hemoglobin of 6.3, platelets of 12,000.  Blood work also showed acute kidney injury with BUN of 20 and creatinine of 1.88. UA was positive for UTI. FOBT was negative.  Patient given 1 g IV Rocephin, 1 unit PRBC and 1 unit of platelets and hospitalist transfer to [facility] long hospital.  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 after midnight, hold Plavix.  Poor prognosis if has progressed to leukemia - hemoglobin today 6.7, platelets 34K -Transfuse 2 units of packed RBCs.  Has ferritin > 7500, COVID-19 test negative -Continue home prednisone  Active Problems: Acute kidney injury on CKD stage III -Suspecting prerenal ATN, anemia, -Presented with creatinine of 1.6, improving with IV  fluids.  History of BPH with bladder outlet obstruction, chronic indwelling Foley -Follows urology at [employer], last catheter change 1 week prior to admission -UA prior to admission showed bacteriuria, urine culture showed no growth -DC IV Rocephin  Essential hypertension -BP stable  Hyperlipidemia Continue statin  COPD -Continue inhalers, no wheezing, stable  Anxiety, depression Continue home meds, Zoloft, Wellbutrin  Generalized weakness -PT OT evaluation, recommended home health   Code Status: DNR DVT Prophylaxis: SCDs Family Communication: Discussed all imaging results, lab results, explained to the patient and Pt's wife on phone.    Disposition Plan: Plan for bone marrow biopsy on 8/27, will remain inpatient  Time Spent in minutes 64mns  Procedures:    Consultants:     Antimicrobials:   Anti-infectives (From admission, onward)   Start     Dose/Rate Route Frequency Ordered Stop   02/03/19 1600  cefTRIAXone (ROCEPHIN) 1 g in sodium chloride 0.9 % 100 mL IVPB     1 g 200 mL/hr over 30 Minutes Intravenous Every 24 hours 02/03/19 1540            Medications  Scheduled Meds:  sodium chloride   Intravenous Once   sodium chloride   Intravenous Once   buPROPion  150 mg Oral  Daily   furosemide  40 mg Intravenous Once   furosemide  40 mg Intravenous Once   gabapentin  300 mg Oral Daily   latanoprost  1 drop Both Eyes QHS   mometasone-formoterol  2 puff Inhalation BID   pantoprazole  40 mg Oral Daily   predniSONE  20 mg Oral Q breakfast   sertraline  100 mg Oral Daily   simvastatin  40 mg Oral QODAY   umeclidinium bromide  1 puff Inhalation Daily   vitamin B-12  5,000 mcg Oral Daily   Continuous Infusions:  sodium chloride Stopped (02/04/19 0805)   cefTRIAXone (ROCEPHIN)  IV 1 g (02/03/19 1613)   PRN Meds:.acetaminophen **OR** acetaminophen, albuterol, LORazepam      Subjective:   Anthony [name] was seen and examined  today.  Feeling generalized weakness. Patient denies dizziness, chest pain, shortness of breath, abdominal pain, N/V/D/C, new weakness, numbess, tingling. No acute events overnight.    Objective:   Vitals:   02/04/19 0816 02/04/19 0835 02/04/19 0851 02/04/19 0856  BP: (!) 150/73 (!) 156/75  (!) 155/90  Pulse: 86 86  89  Resp: (!) 30 (!) 24  (!) 22  Temp: 98.1 F (36.7 C) 98.1 F (36.7 C)  98 F (36.7 C)  TempSrc: Oral Oral  Oral  SpO2: 92% 91% 93% 93%  Weight:      Height:        Intake/Output Summary (Last 24 hours) at 02/04/2019 1128 Last data filed at 02/04/2019 0945 Gross per 24 hour  Intake 1080 ml  Output 2675 ml  Net -1595 ml     Wt Readings from Last 3 Encounters:  02/04/19 76.3 kg  08/25/18 81.2 kg  07/28/18 80.7 kg     Exam  General: Alert and oriented x 3, NAD  Eyes:   HEENT:  Atraumatic, normocephalic, normal oropharynx  Cardiovascular: S1 S2 auscultated, no murmurs, RRR  Respiratory: Clear to auscultation bilaterally, no wheezing, rales or rhonchi  Gastrointestinal: Soft, nontender, nondistended, + bowel sounds  Ext: no pedal edema bilaterally  Neuro: No new deficits  Musculoskeletal: No digital cyanosis, clubbing  Skin: No rashes  Psych: Normal affect and demeanor, alert and oriented x3    Data Reviewed:  I have personally reviewed following labs and imaging studies  Micro Results Recent Results (from the past 240 hour(s))  SARS CORONAVIRUS 2 (TAT 6-12 HRS) Nasal Swab Urine, Catheterized     Status: None   Collection Time: 02/02/19  9:20 PM   Specimen: Urine, Catheterized; Nasal Swab  Result Value Ref Range Status   SARS Coronavirus 2 NEGATIVE NEGATIVE Final    Comment: (NOTE) SARS-CoV-2 target nucleic acids are NOT DETECTED. The SARS-CoV-2 RNA is generally detectable in upper and lower respiratory specimens during the acute phase of infection. Negative results do not preclude SARS-CoV-2 infection, do not rule out co-infections  with other pathogens, and should not be used as the sole basis for treatment or other patient management decisions. Negative results must be combined with clinical observations, patient history, and epidemiological information. The expected result is Negative. Fact Sheet for Patients: SugarRoll.be Fact Sheet for Healthcare Providers: https://www.woods-mathews.com/ This test is not yet approved or cleared by the [place] FDA and  has been authorized for detection and/or diagnosis of SARS-CoV-2 by FDA under an Emergency Use Authorization (EUA). This EUA will remain  in effect (meaning this test can be used) for the duration of the COVID-19 declaration under Section 56 4(b)(1) of the Act, 21 U.S.C. section  360bbb-3(b)(1), unless the authorization is terminated or revoked sooner. Performed at [facility], [place] [address], [place], [place] [address]   Culture, Urine     Status: None   Collection Time: 02/02/19 10:05 PM   Specimen: Urine, Clean Catch  Result Value Ref Range Status   Specimen Description   Final    URINE, CLEAN CATCH Performed at [facility], [place] [address] [place], [place] [address]    Special Requests   Final    NONE Performed at [facility], [place] [address] [place], [place] [address]    Culture   Final    NO GROWTH Performed at [facility], [place] [address], [place], [place] [address]    Report Status 02/04/2019 FINAL  Final    Radiology Reports No results found.  Lab Data:  CBC: Recent Labs  Lab 02/02/19 2209 02/03/19 0435 02/04/19 0448  WBC 18.2* 16.6* 15.8*  NEUTROABS 2.7 1.7  --   HGB 8.3* 7.6* 6.7*  HCT 26.0* 24.1* 21.1*  MCV 113.0* 112.6* 112.2*  PLT 55* 47* 34*   Basic Metabolic Panel: Recent Labs  Lab 02/02/19 2209 02/03/19 0435 02/04/19 0448  NA 142 139 138  K 4.1 3.5 3.6  CL 109 109 111  CO2 20* 21* 20*  GLUCOSE 94 98 95  BUN '20 19  17  ' CREATININE 1.69* 1.57* 1.29*  CALCIUM 8.1* 7.6* 7.5*   GFR: Estimated Creatinine Clearance: 45.7 mL/min (A) (by C-G formula based on SCr of 1.29 mg/dL (H)). Liver Function Tests: Recent Labs  Lab 02/02/19 2209 02/04/19 0448  AST 62* 43*  ALT 12 11  ALKPHOS 41 28*  BILITOT 0.4 0.5  PROT 6.7 5.5*  ALBUMIN 3.0* 2.4*   No results for input(s): LIPASE, AMYLASE in the last 168 hours. No results for input(s): AMMONIA in the last 168 hours. Coagulation Profile: Recent Labs  Lab 02/02/19 2209 02/04/19 0610  INR 1.2 1.2   Cardiac Enzymes: No results for input(s): CKTOTAL, CKMB, CKMBINDEX, TROPONINI in the last 168 hours. BNP (last 3 results) Recent Labs    02/05/18 1528  PROBNP 58.0   HbA1C: No results for input(s): HGBA1C in the last 72 hours. CBG: No results for input(s): GLUCAP in the last 168 hours. Lipid Profile: No results for input(s): CHOL, HDL, LDLCALC, TRIG, CHOLHDL, LDLDIRECT in the last 72 hours. Thyroid Function Tests: No results for input(s): TSH, T4TOTAL, FREET4, T3FREE, THYROIDAB in the last 72 hours. Anemia Panel: Recent Labs    02/02/19 2209  VITAMINB12 >7,500*  FOLATE 21.3  FERRITIN >7,500*  TIBC 255  IRON 103  RETICCTPCT 0.3*   Urine analysis:    Component Value Date/Time   COLORURINE YELLOW 02/02/2019 2141   APPEARANCEUR CLEAR 02/02/2019 2141   LABSPEC 1.010 02/02/2019 2141   PHURINE 6.0 02/02/2019 2141   GLUCOSEU NEGATIVE 02/02/2019 2141   HGBUR SMALL (A) 02/02/2019 2141   BILIRUBINUR NEGATIVE 02/02/2019 2141   [place] 02/02/2019 2141   PROTEINUR 30 (A) 02/02/2019 2141   NITRITE NEGATIVE 02/02/2019 2141   LEUKOCYTESUR SMALL (A) 02/02/2019 2141     Acelin Ferdig M.D. [facility] 02/04/2019, 11:28 AM  Pager: [phone] Between 7am to 7pm - call Pager - [phone]  After 7pm go to [email or web address] - password [email or web address]  Call night coverage person covering after 7pm

## 2019-02-04 NOTE — Progress Notes (Signed)
CRITICAL VALUE ALERT  Critical Value: Hemoglobin 6.7  Date & Time Notied:  02/04/19 @ 0540  Provider Notified: yes  Orders Received/Actions taken awaiting new orders

## 2019-02-04 NOTE — Progress Notes (Signed)
OT Cancellation Note  Patient Details Name: Anthony [name] MRN: [id number] DOB: 1941/05/04   Cancelled Treatment:    Reason Eval/Treat Not Completed: Other (comment).  Pt just started 2nd unit of blood. Will try to check back tomorrow  Anthony [name] 02/04/2019, 2:27 PM  Lesle Chris, OTR/L [facility] Services [phone] [facility] pager [phone] office 02/04/2019

## 2019-02-05 ENCOUNTER — Encounter (HOSPITAL_COMMUNITY): Payer: Self-pay | Admitting: Radiology

## 2019-02-05 ENCOUNTER — Inpatient Hospital Stay (HOSPITAL_COMMUNITY): Payer: Medicare Other

## 2019-02-05 LAB — CBC WITH DIFFERENTIAL/PLATELET
Band Neutrophils: 9 %
Basophils Absolute: 0 10*3/uL (ref 0.0–0.1)
Basophils Relative: 0 %
Blasts: 26 %
Eosinophils Absolute: 0 10*3/uL (ref 0.0–0.5)
Eosinophils Relative: 0 %
HCT: 32.5 % — ABNORMAL LOW (ref 39.0–52.0)
Hemoglobin: 10.9 g/dL — ABNORMAL LOW (ref 13.0–17.0)
Lymphocytes Relative: 4 %
Lymphs Abs: 0.7 10*3/uL (ref 0.7–4.0)
MCH: 33.5 pg (ref 26.0–34.0)
MCHC: 33.5 g/dL (ref 30.0–36.0)
MCV: 100 fL (ref 80.0–100.0)
Metamyelocytes Relative: 0 %
Monocytes Absolute: 0.3 10*3/uL (ref 0.1–1.0)
Monocytes Relative: 2 %
Myelocytes: 33 %
Neutro Abs: 11.1 10*3/uL — ABNORMAL HIGH (ref 1.7–7.7)
Neutrophils Relative %: 26 %
Other: 0 %
Platelets: 25 10*3/uL — CL (ref 150–400)
Promyelocytes Relative: 0 %
RBC: 3.25 MIL/uL — ABNORMAL LOW (ref 4.22–5.81)
RDW: 21.4 % — ABNORMAL HIGH (ref 11.5–15.5)
WBC: 16.3 10*3/uL — ABNORMAL HIGH (ref 4.0–10.5)
nRBC: 0 % (ref 0.0–0.2)
nRBC: 0 /100 WBC

## 2019-02-05 LAB — BPAM RBC
Blood Product Expiration Date: 202010012359
Blood Product Expiration Date: 202010012359
ISSUE DATE / TIME: 202008260823
ISSUE DATE / TIME: 202008261403
Unit Type and Rh: 5100
Unit Type and Rh: 5100

## 2019-02-05 LAB — PROTIME-INR
INR: 1.2 (ref 0.8–1.2)
Prothrombin Time: 14.9 seconds (ref 11.4–15.2)

## 2019-02-05 LAB — TYPE AND SCREEN
ABO/RH(D): O POS
Antibody Screen: NEGATIVE
Unit division: 0
Unit division: 0

## 2019-02-05 MED ORDER — FENTANYL CITRATE (PF) 100 MCG/2ML IJ SOLN
INTRAMUSCULAR | Status: AC | PRN
  Administered 2019-02-05 (×2): 25 ug via INTRAVENOUS
  Administered 2019-02-05: 50 ug via INTRAVENOUS

## 2019-02-05 MED ORDER — LIDOCAINE HCL (PF) 1 % IJ SOLN
INTRAMUSCULAR | Status: AC | PRN
  Administered 2019-02-05: 10 mL

## 2019-02-05 MED ORDER — FLUMAZENIL 0.5 MG/5ML IV SOLN
INTRAVENOUS | Status: AC
  Filled 2019-02-05: qty 5

## 2019-02-05 MED ORDER — MIDAZOLAM HCL 2 MG/2ML IJ SOLN
INTRAMUSCULAR | Status: AC
  Filled 2019-02-05: qty 2

## 2019-02-05 MED ORDER — NALOXONE HCL 0.4 MG/ML IJ SOLN
INTRAMUSCULAR | Status: AC
  Filled 2019-02-05: qty 1

## 2019-02-05 MED ORDER — SODIUM CHLORIDE 0.9 % IV SOLN
INTRAVENOUS | Status: AC
  Administered 2019-02-06: 08:00:00 via INTRAVENOUS
  Filled 2019-02-05: qty 250

## 2019-02-05 MED ORDER — MIDAZOLAM HCL 2 MG/2ML IJ SOLN
INTRAMUSCULAR | Status: AC | PRN
  Administered 2019-02-05: 0.5 mg via INTRAVENOUS
  Administered 2019-02-05: 1 mg via INTRAVENOUS
  Administered 2019-02-05: 0.5 mg via INTRAVENOUS

## 2019-02-05 MED ORDER — FENTANYL CITRATE (PF) 100 MCG/2ML IJ SOLN
INTRAMUSCULAR | Status: AC
  Filled 2019-02-05: qty 2

## 2019-02-05 NOTE — Progress Notes (Signed)
Critical Lab Value received: Platelets: 25 RN notified MD.

## 2019-02-05 NOTE — Progress Notes (Signed)
Mr. Ruffins is about the same.  I am just sad that he has this Foley catheter in.  I am not sure what the issue was with his urine.  I would think that having this catheter in long-term is going to pose a lot of issues for him.  I will know if urology needs to see him.  I do not know if he has problems with an enlarged prostate.  He is yet to have the bone marrow biopsy done.  Hopefully this will be done today.  I probably will have any results back until next week.  He got 2 units of blood yesterday.  His "color" does look better.  No lab work is back yet.  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  I think the real issue is whether or not this can be treated.  He is not a candidate for aggressive intervention.  Anything that we do with surgically be palliative to try to help his quality of life.  It is hard to say how much he is eating.  Overall, his vital signs are stable.  His temperature is 99.1.  Pulse 90.  Blood pressure 156/78.  There is no real change in his physical exam.  Again, the answer as to what is going on with his blood will be found with his bone marrow biopsy.  It will be essential that we get chromosome studies and other molecular test on the bone marrow which will help [place] with any treatment recommendations.  Again, the Foley catheter is plugged and had to be addressed at some time.  I am not sure why he is not able to void.  I do very much appreciate all the great care that he is getting from the staff on [place], [place]  [place] [phone]

## 2019-02-05 NOTE — Progress Notes (Signed)
[facility]                                                                              Patient Demographics  Anthony [name], is a 78 y.o. male, DOB - 01/19/1941, [id number]  Admit date - 02/02/2019   Admitting Physician Kayleen Memos, DO  Outpatient Primary MD for the patient is Orpah Melter, MD  Outpatient specialists:   LOS - 2  days   Medical records reviewed and are as summarized below:    No chief complaint on file.      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 intake for almost a week. He was found to have significant leukocytosis (WBC 20 K with lymphocytes, monocytes and 14% blasts), hemoglobin of 6.3, platelets of 12,000.  Blood work also showed acute kidney injury with BUN of 20 and creatinine of 1.88. UA was positive for UTI. FOBT was negative.  Patient given 1 g IV Rocephin, 1 unit PRBC and 1 unit of platelets and hospitalist transfer to [facility].  Assessment & Plan    Principal Problem: Pancytopenia with excess blast, underlying history of myelodysplasia and refractory anemia anemia -Presented with markedly elevated WBC, elevated LDH, anemia, thrombocytopenia, concerning for MDS with leukemic progression -status post bone marrow biopsy today, will follow results -Platelets down today, 25K, oncology following, Dr. Marin Olp, defer platelet transfusion to oncology - Poor prognosis if has progressed to leukemia - Hemoglobin 10.9, stable after 2 units packed RBC transfusion on 8/26 for hemoglobin of 6.7.  -  Has ferritin > 7500, COVID-19 test negative -Continue home prednisone  Active Problems: Acute kidney injury on CKD stage  III -Suspecting prerenal ATN, anemia, -Presented with creatinine of 1.6,  -Currently improving, 1.2   History of BPH with bladder outlet obstruction, chronic indwelling Foley -Follows urology at [place], last catheter change 1 week prior to admission -UA prior to admission showed bacteriuria, urine culture showed no growth -DC IV Rocephin  Essential hypertension -BP stable  Hyperlipidemia Continue statin  COPD -Continue inhalers, no wheezing, stable  Anxiety, depression Continue home meds, Zoloft, Wellbutrin  Generalized weakness -PT OT evaluation, recommended home health   Code Status: DNR DVT Prophylaxis: SCDs Family Communication: Discussed all imaging results, lab results, explained to the patient and Pt's wife on phone on 8/26.    Disposition Plan: Will await recommendations from Dr. Marin Olp, if patient can be discharged in a.m. and can follow bone marrow biopsy results outpatient  Time Spent in minutes 63mns  Procedures:  Bone marrow biopsy 8/27  Consultants:   Oncology Intervention radiology  Antimicrobials:   Anti-infectives (From admission, onward)   Start     Dose/Rate Route Frequency Ordered Stop   02/03/19 1600  cefTRIAXone (ROCEPHIN) 1 g in sodium chloride 0.9 % 100 mL IVPB  Status:  Discontinued     1 g 200 mL/hr over 30 Minutes Intravenous Every 24 hours 02/03/19 1540  02/04/19 1224         Medications  Scheduled Meds: . sodium chloride   Intravenous Once  . sodium chloride   Intravenous Once  . buPROPion  150 mg Oral Daily  . gabapentin  300 mg Oral Daily  . latanoprost  1 drop Both Eyes QHS  . mometasone-formoterol  2 puff Inhalation BID  . pantoprazole  40 mg Oral Daily  . predniSONE  20 mg Oral Q breakfast  . sertraline  100 mg Oral Daily  . simvastatin  40 mg Oral QODAY  . umeclidinium bromide  1 puff Inhalation Daily  . vitamin B-12  5,000 mcg Oral Daily   Continuous Infusions: . sodium chloride     PRN  Meds:.acetaminophen **OR** acetaminophen, albuterol, LORazepam      Subjective:   Anthony [name] was seen and examined today.  Seen after bone marrow biopsy, somewhat sleepy otherwise pain controlled with pain medications. Patient denies dizziness, chest pain, shortness of breath, abdominal pain, N/V/D/C, new weakness, numbess, tingling. No acute events overnight.    Objective:   Vitals:   02/05/19 0940 02/05/19 0945 02/05/19 0950 02/05/19 1020  BP: (!) 169/89 (!) 175/83 (!) 165/81 131/81  Pulse: 96 96 96 97  Resp: '18 17 17 16  ' Temp:    97.9 F (36.6 C)  TempSrc:      SpO2: 97% 97% 95% 91%  Weight:      Height:        Intake/Output Summary (Last 24 hours) at 02/05/2019 1255 Last data filed at 02/05/2019 1117 Gross per 24 hour  Intake 578 ml  Output 4575 ml  Net -3997 ml     Wt Readings from Last 3 Encounters:  02/05/19 74.4 kg  08/25/18 81.2 kg  07/28/18 80.7 kg   Physical Exam  General: Alert and oriented x 3, NAD  Eyes:   HEENT:   Cardiovascular: S1 S2 clear, , RRR. No pedal edema b/l  Respiratory: CTAB, no wheezing, rales or rhonchi  Gastrointestinal: Soft, nontender, nondistended, NBS  Ext: no pedal edema bilaterally  Neuro: no new deficits  Musculoskeletal: No cyanosis, clubbing  Skin: No rashes  Psych: Normal affect and demeanor, alert and oriented x3     Data Reviewed:  I have personally reviewed following labs and imaging studies  Micro Results Recent Results (from the past 240 hour(s))  SARS CORONAVIRUS 2 (TAT 6-12 HRS) Nasal Swab Urine, Catheterized     Status: None   Collection Time: 02/02/19  9:20 PM   Specimen: Urine, Catheterized; Nasal Swab  Result Value Ref Range Status   SARS Coronavirus 2 NEGATIVE NEGATIVE Final    Comment: (NOTE) SARS-CoV-2 target nucleic acids are NOT DETECTED. The SARS-CoV-2 RNA is generally detectable in upper and lower respiratory specimens during the acute phase of infection. Negative results do not  preclude SARS-CoV-2 infection, do not rule out co-infections with other pathogens, and should not be used as the sole basis for treatment or other patient management decisions. Negative results must be combined with clinical observations, patient history, and epidemiological information. The expected result is Negative. Fact Sheet for Patients: SugarRoll.be Fact Sheet for Healthcare Providers: https://www.woods-mathews.com/ This test is not yet approved or cleared by the [place] FDA and  has been authorized for detection and/or diagnosis of SARS-CoV-2 by FDA under an Emergency Use Authorization (EUA). This EUA will remain  in effect (meaning this test can be used) for the duration of the COVID-19 declaration under Section 56 4(b)(1) of the Act,  21 U.S.C. section 360bbb-3(b)(1), unless the authorization is terminated or revoked sooner. Performed at [facility], [place] [address], [place], [place] [address]   Culture, Urine     Status: None   Collection Time: 02/02/19 10:05 PM   Specimen: Urine, Clean Catch  Result Value Ref Range Status   Specimen Description   Final    URINE, CLEAN CATCH Performed at [facility], [place] [address] [place], [place] [address]    Special Requests   Final    NONE Performed at [facility], [place] [address] [place], [place] [address]    Culture   Final    NO GROWTH Performed at [facility], [place] [address] [place], [place] [address]    Report Status 02/04/2019 FINAL  Final    Radiology Reports Ct Biopsy  Result Date: 02/05/2019 CLINICAL DATA:  Acute myeloid leukemia. EXAM: CT GUIDED BONE MARROW ASPIRATION AND BIOPSY ANESTHESIA/SEDATION: Versed 2.0 mg IV, Fentanyl 100 mcg IV Total Moderate Sedation Time:   15 minutes. The patient's level of consciousness and physiologic status were continuously monitored during the procedure by Radiology nursing.  PROCEDURE: The procedure risks, benefits, and alternatives were explained to the patient. Questions regarding the procedure were encouraged and answered. The patient understands and consents to the procedure. A time out was performed prior to initiating the procedure. The right gluteal region was prepped with chlorhexidine. Sterile gown and sterile gloves were used for the procedure. Local anesthesia was provided with 1% Lidocaine. Under CT guidance, an 11 gauge On Control bone cutting needle was advanced from a posterior approach into the right iliac bone. Needle positioning was confirmed with CT. Initial non heparinized and heparinized aspirate samples were obtained of bone marrow. Core biopsy was performed via the On Control drill needle. COMPLICATIONS: None FINDINGS: Inspection of initial aspirate did reveal visible particles. Intact core biopsy sample was obtained. IMPRESSION: CT guided bone marrow biopsy of right posterior iliac bone with both aspirate and core samples obtained. Electronically Signed   By: Aletta Edouard M.D.   On: 02/05/2019 10:35   Ct Bone Marrow Biopsy & Aspiration  Result Date: 02/05/2019 CLINICAL DATA:  Acute myeloid leukemia. EXAM: CT GUIDED BONE MARROW ASPIRATION AND BIOPSY ANESTHESIA/SEDATION: Versed 2.0 mg IV, Fentanyl 100 mcg IV Total Moderate Sedation Time:   15 minutes. The patient's level of consciousness and physiologic status were continuously monitored during the procedure by Radiology nursing. PROCEDURE: The procedure risks, benefits, and alternatives were explained to the patient. Questions regarding the procedure were encouraged and answered. The patient understands and consents to the procedure. A time out was performed prior to initiating the procedure. The right gluteal region was prepped with chlorhexidine. Sterile gown and sterile gloves were used for the procedure. Local anesthesia was provided with 1% Lidocaine. Under CT guidance, an 11 gauge On Control bone  cutting needle was advanced from a posterior approach into the right iliac bone. Needle positioning was confirmed with CT. Initial non heparinized and heparinized aspirate samples were obtained of bone marrow. Core biopsy was performed via the On Control drill needle. COMPLICATIONS: None FINDINGS: Inspection of initial aspirate did reveal visible particles. Intact core biopsy sample was obtained. IMPRESSION: CT guided bone marrow biopsy of right posterior iliac bone with both aspirate and core samples obtained. Electronically Signed   By: Aletta Edouard M.D.   On: 02/05/2019 10:35    Lab Data:  CBC: Recent Labs  Lab 02/02/19 2209 02/03/19 0435 02/04/19  0448 02/05/19 0551  WBC 18.2* 16.6* 15.8* 16.3*  NEUTROABS 2.7 1.7  --  11.1*  HGB 8.3* 7.6* 6.7* 10.9*  HCT 26.0* 24.1* 21.1* 32.5*  MCV 113.0* 112.6* 112.2* 100.0  PLT 55* 47* 34* 25*   Basic Metabolic Panel: Recent Labs  Lab 02/02/19 2209 02/03/19 0435 02/04/19 0448  NA 142 139 138  K 4.1 3.5 3.6  CL 109 109 111  CO2 20* 21* 20*  GLUCOSE 94 98 95  BUN '20 19 17  ' CREATININE 1.69* 1.57* 1.29*  CALCIUM 8.1* 7.6* 7.5*   GFR: Estimated Creatinine Clearance: 45.7 mL/min (A) (by C-G formula based on SCr of 1.29 mg/dL (H)). Liver Function Tests: Recent Labs  Lab 02/02/19 2209 02/04/19 0448  AST 62* 43*  ALT 12 11  ALKPHOS 41 28*  BILITOT 0.4 0.5  PROT 6.7 5.5*  ALBUMIN 3.0* 2.4*   No results for input(s): LIPASE, AMYLASE in the last 168 hours. No results for input(s): AMMONIA in the last 168 hours. Coagulation Profile: Recent Labs  Lab 02/02/19 2209 02/04/19 0610 02/05/19 0551  INR 1.2 1.2 1.2   Cardiac Enzymes: No results for input(s): CKTOTAL, CKMB, CKMBINDEX, TROPONINI in the last 168 hours. BNP (last 3 results) Recent Labs    02/05/18 1528  PROBNP 58.0   HbA1C: No results for input(s): HGBA1C in the last 72 hours. CBG: No results for input(s): GLUCAP in the last 168 hours. Lipid Profile: No results  for input(s): CHOL, HDL, LDLCALC, TRIG, CHOLHDL, LDLDIRECT in the last 72 hours. Thyroid Function Tests: No results for input(s): TSH, T4TOTAL, FREET4, T3FREE, THYROIDAB in the last 72 hours. Anemia Panel: Recent Labs    02/02/19 2209  VITAMINB12 >7,500*  FOLATE 21.3  FERRITIN >7,500*  TIBC 255  IRON 103  RETICCTPCT 0.3*   Urine analysis:    Component Value Date/Time   COLORURINE YELLOW 02/02/2019 2141   APPEARANCEUR CLEAR 02/02/2019 2141   LABSPEC 1.010 02/02/2019 2141   PHURINE 6.0 02/02/2019 2141   GLUCOSEU NEGATIVE 02/02/2019 2141   HGBUR SMALL (A) 02/02/2019 2141   BILIRUBINUR NEGATIVE 02/02/2019 2141   Eleva 02/02/2019 2141   PROTEINUR 30 (A) 02/02/2019 2141   NITRITE NEGATIVE 02/02/2019 2141   LEUKOCYTESUR SMALL (A) 02/02/2019 2141       M.D. [facility] 02/05/2019, 12:55 PM  Pager: [phone] Between 7am to 7pm - call Pager - [phone]  After 7pm go to [email or web address] - password [email or web address]  Call night coverage person covering after 7pm

## 2019-02-05 NOTE — TOC Initial Note (Signed)
Transition of Care [facility]) - Initial/Assessment Note    Patient Details  Name: Anthony [name] MRN: [id number] Date of Birth: 1940-11-15  Transition of [facility]) CM/SW Contact:    Anthony Neighbors, LCSW Phone Number: 02/05/2019, 1:13 PM  Clinical Narrative:   Patient lives at home with spouse. CSW spoke with patients spouse Anthony [name] via phone. Anthony [name] stated she is agreeable with patient to dc home with Anthony [name]. Anthony [name] stated that patient was being followed with [facility] and she would prefer to stay with them. CSW contacted Anthony [name] and asked if they will be able to continue following patient at discharge.                 Expected Discharge Plan: [place] Barriers to Discharge: Continued Medical Work up   Patient Goals and CMS Choice   CMS Medicare.gov Compare Post Acute Care list provided to:: (spoke with spouse) Choice offered to / list presented to : Adult Children  Expected Discharge Plan and Services Expected Discharge Plan: [place] In-house Referral: Clinical Social Work   Post Acute Care Choice: [place] arrangements for the past 2 months: [place]                           HH Arranged: PT, RN, Nurse's Aide [place] Agency: [place] Date [place]: 02/05/19 Time [place]: 84 Representative spoke with at [place]: spoke with drew  Prior Living Arrangements/Services Living arrangements for the past 2 months: [place] Lives with:: Self, Spouse Patient language and need for interpreter reviewed:: Yes Do you feel safe going back to the place where you live?: Yes      Need for Family Participation in Patient Care: Yes (Comment) Care giver support system in place?: Yes (comment)   Criminal Activity/Legal Involvement Pertinent to Current Situation/Hospitalization: No - Comment as needed  Activities of Daily Living Home Assistive Devices/Equipment: Wheelchair, Environmental consultant  (specify type) ADL Screening (condition at time of admission) Patient's cognitive ability adequate to safely complete daily activities?: Yes Is the patient deaf or have difficulty hearing?: Yes Does the patient have difficulty seeing, even when wearing glasses/contacts?: No Does the patient have difficulty concentrating, remembering, or making decisions?: No Patient able to express need for assistance with ADLs?: No Does the patient have difficulty dressing or bathing?: Yes Independently performs ADLs?: Yes (appropriate for developmental age) Does the patient have difficulty walking or climbing stairs?: Yes Weakness of Legs: Both Weakness of Arms/Hands: Both  Permission Sought/Granted Permission sought to share information with : Family Supports Permission granted to share information with : Yes, Verbal Permission Granted  Share Information with NAME: Anthony [name]     Permission granted to share info w Relationship: [phone]  Permission granted to share info w Contact Information: spouse  Emotional Assessment Appearance:: Appears stated age Attitude/Demeanor/Rapport: Unable to Assess Affect (typically observed): Unable to Assess Orientation: : Oriented to  Time, Oriented to Situation, Oriented to Place, Oriented to Self Alcohol / Substance Use: Not Applicable Psych Involvement: No (comment)  Admission diagnosis:  ANEMIA CANCER Patient Active Problem List   Diagnosis Date Noted  . Pancytopenia ([place]) 02/03/2019  . Thrombocytopenia ([place]) 02/02/2019  . Urinary retention due to benign prostatic hyperplasia 02/02/2019  . Indwelling Foley catheter present 02/02/2019  . Osteoporosis 02/14/2018  . Compression fracture of spine ([place]) 02/14/2018  . Orthostasis 09/08/2014  . Myelodysplasia, low grade (  [place]) 01/15/2014  . Iron deficiency anemia 01/15/2014  . Anemia 01/01/2014  . Dyspnea 09/07/2013  . Chest discomfort 09/07/2013  . Essential hypertension 09/07/2013  .  Hyperlipidemia 09/07/2013  . CAD (coronary artery disease), native coronary artery 09/07/2013  . Retrocecal appendicitis 03/23/2013  . COPD (chronic obstructive pulmonary disease) ([place]) 06/17/2012   PCP:  Anthony Melter, MD Pharmacy:   [facility], [address] [place] [address] Phone: [phone] Fax: [phone]  [place], [place] [address] [place] [address] Phone: [phone] Fax: [phone]  [place], [place] [address] Phone: [phone] Fax: [phone]     Social Determinants of Health (SDOH) Interventions    Readmission Risk Interventions No flowsheet data found.

## 2019-02-05 NOTE — Progress Notes (Signed)
OT Cancellation Note  Patient Details Name: Anthony [name] MRN: [id number] DOB: [birth year]   Cancelled Treatment:    Reason Eval/Treat Not Completed: Patient at procedure or test/ unavailable:  Bone marrow biopsy  [name] 02/05/2019, 9:05 AM  Lesle Chris, OTR/L Acute Rehabilitation Services [phone] [facility] pager [phone] office 02/05/2019

## 2019-02-05 NOTE — Procedures (Signed)
Interventional Radiology Procedure Note  Procedure: CT guided bone marrow aspiration and biopsy  Complications: None  EBL: < 10 mL  Findings: Aspirate and core biopsy performed of bone marrow in right iliac bone.  Plan: Bedrest supine x 1 hrs  Mareli Antunes T. Aarav Burgett, M.D Pager:  [phone]   

## 2019-02-06 ENCOUNTER — Other Ambulatory Visit: Payer: Medicare Other

## 2019-02-06 ENCOUNTER — Ambulatory Visit: Payer: Medicare Other | Admitting: Hematology & Oncology

## 2019-02-06 DIAGNOSIS — Z960 Presence of urogenital implants: Secondary | ICD-10-CM

## 2019-02-06 LAB — CBC
HCT: 34.5 % — ABNORMAL LOW (ref 39.0–52.0)
Hemoglobin: 11.3 g/dL — ABNORMAL LOW (ref 13.0–17.0)
MCH: 32.9 pg (ref 26.0–34.0)
MCHC: 32.8 g/dL (ref 30.0–36.0)
MCV: 100.6 fL — ABNORMAL HIGH (ref 80.0–100.0)
Platelets: 19 10*3/uL — CL (ref 150–400)
RBC: 3.43 MIL/uL — ABNORMAL LOW (ref 4.22–5.81)
RDW: 20.6 % — ABNORMAL HIGH (ref 11.5–15.5)
WBC: 10.2 10*3/uL (ref 4.0–10.5)
nRBC: 0 % (ref 0.0–0.2)

## 2019-02-06 LAB — BASIC METABOLIC PANEL
Anion gap: 12 (ref 5–15)
BUN: 26 mg/dL — ABNORMAL HIGH (ref 8–23)
CO2: 22 mmol/L (ref 22–32)
Calcium: 8.6 mg/dL — ABNORMAL LOW (ref 8.9–10.3)
Chloride: 105 mmol/L (ref 98–111)
Creatinine, Ser: 1.38 mg/dL — ABNORMAL HIGH (ref 0.61–1.24)
GFR calc Af Amer: 56 mL/min — ABNORMAL LOW (ref >60)
GFR calc non Af Amer: 48 mL/min — ABNORMAL LOW (ref >60)
Glucose, Bld: 134 mg/dL — ABNORMAL HIGH (ref 70–99)
Potassium: 3.7 mmol/L (ref 3.5–5.1)
Sodium: 139 mmol/L (ref 135–145)

## 2019-02-06 LAB — LACTATE DEHYDROGENASE: LDH: 2097 U/L — ABNORMAL HIGH (ref 98–192)

## 2019-02-06 MED ORDER — ACETAMINOPHEN 325 MG PO TABS
650.0000 mg | ORAL_TABLET | Freq: Once | ORAL | Status: AC
  Administered 2019-02-06: 650 mg via ORAL
  Filled 2019-02-06: qty 2

## 2019-02-06 MED ORDER — FUROSEMIDE 10 MG/ML IJ SOLN
20.0000 mg | Freq: Once | INTRAMUSCULAR | Status: AC
  Administered 2019-02-06: 20 mg via INTRAVENOUS
  Filled 2019-02-06: qty 2

## 2019-02-06 MED ORDER — SODIUM CHLORIDE 0.9% IV SOLUTION
Freq: Once | INTRAVENOUS | Status: AC
  Administered 2019-02-06: 08:00:00 via INTRAVENOUS

## 2019-02-06 MED ORDER — DIPHENHYDRAMINE HCL 25 MG PO CAPS
25.0000 mg | ORAL_CAPSULE | Freq: Once | ORAL | Status: AC
  Administered 2019-02-06: 25 mg via ORAL
  Filled 2019-02-06: qty 1

## 2019-02-06 MED ORDER — SODIUM CHLORIDE 0.9% IV SOLUTION: Freq: Once | INTRAVENOUS | Status: DC

## 2019-02-06 MED ORDER — PROCHLORPERAZINE MALEATE 10 MG PO TABS: 10.0000 mg | ORAL_TABLET | Freq: Four times a day (QID) | ORAL | 1 refills | Status: AC | PRN

## 2019-02-06 NOTE — Progress Notes (Signed)
PT dc'd home via wheelchair. His wife is driving him home. He verbalized understanding of DC instuctions,

## 2019-02-06 NOTE — Discharge Summary (Signed)
Physician Discharge Summary   Patient ID: Anthony [name] MRN: [id number] DOB/AGE: 01-06-1941 78 y.o.  Admit date: 02/02/2019 Discharge date: 02/06/2019  Primary Care Physician:  Orpah Melter, MD   Recommendations for Outpatient Follow-up:  1. Follow up with PCP in 1-2 weeks 2. Patient will follow-up with Dr. Marin Olp for the biopsy results, CBC  Home Health: Home health PT OT, RN, aide Equipment/Devices:   Discharge Condition: stable  CODE STATUS:  DNR   Diet recommendation: Heart healthy diet   Discharge Diagnoses:   . Pancytopenia ([place]) status post bone marrow biopsy, results pending . Thrombocytopenia ([place]) . CAD (coronary artery disease), native coronary artery . Anemia . COPD (chronic obstructive pulmonary disease) ([place]) . Essential hypertension . Hyperlipidemia . Myelodysplasia, low grade ([place]) . Generalized debility History of BPH with bladder outlet obstruction, chronic indwelling Foley  Consults: Oncology, Dr. Marin Olp    Allergies:  No Known Allergies   DISCHARGE MEDICATIONS: Allergies as of 02/06/2019   No Known Allergies     Medication List    TAKE these medications   albuterol (2.5 MG/3ML) 0.083% nebulizer solution Commonly known as: PROVENTIL Take 3 mLs (2.5 mg total) by nebulization every 6 (six) hours as needed for wheezing or shortness of breath.   bimatoprost 0.01 % Soln Commonly known as: LUMIGAN Place 1 drop into both eyes at bedtime.   budesonide-formoterol 160-4.5 MCG/ACT inhaler Commonly known as: SYMBICORT Inhale 2 puffs into the lungs 2 (two) times daily.   buPROPion 150 MG 24 hr tablet Commonly known as: WELLBUTRIN XL Take 150 mg by mouth daily.   CITRACAL PO Take 1,000 Units by mouth 2 (two) times daily.   co-enzyme Q-10 30 MG capsule Take 100 mg by mouth daily.   Cyanocobalamin 2500 MCG Tabs Take 5,000 mcg by mouth daily.   fish oil-omega-3 fatty acids 1000 MG capsule Take 1 g by mouth daily.   fluticasone 50  MCG/ACT nasal spray Commonly known as: FLONASE Place 1 spray into both nostrils daily.   glucosamine-chondroitin 500-400 MG tablet Take 1 tablet by mouth daily.   LORazepam 0.5 MG tablet Commonly known as: ATIVAN TAKE 1 TABLET BY MOUTH EVERY 6 HOURS AS NEEDED FOR NAUSEA OR VOMITING What changed: See the new instructions.   Neurontin 300 MG capsule Generic drug: gabapentin Take 300 mg by mouth daily.   pantoprazole 40 MG tablet Commonly known as: PROTONIX Take 40 mg by mouth daily.   predniSONE 20 MG tablet Commonly known as: DELTASONE TAKE 1 TABLET BY MOUTH ONCE DAILY WITH BREAKFAST What changed: See the new instructions.   prochlorperazine 10 MG tablet Commonly known as: COMPAZINE Take 1 tablet (10 mg total) by mouth every 6 (six) hours as needed (Nausea or vomiting).   sertraline 100 MG tablet Commonly known as: ZOLOFT Take 100 mg by mouth daily.   Simbrinza 1-0.2 % Susp Generic drug: Brinzolamide-Brimonidine Place 1 drop into both eyes daily.   simvastatin 40 MG tablet Commonly known as: ZOCOR Take 40 mg by mouth every other day.   tiotropium 18 MCG inhalation capsule Commonly known as: SPIRIVA Place 18 mcg into inhaler and inhale daily.   triamcinolone cream 0.1 % Commonly known as: KENALOG Apply 1 application topically 2 (two) times daily as needed (itching and redness).   Vitamin D3 125 MCG (5000 UT) Tabs Take 1 tablet by mouth daily.        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 intake for almost a week. He was found to have significant leukocytosis (WBC 20 K with lymphocytes,  monocytes and 14% blasts), hemoglobin of 6.3, platelets of 12,000. Blood work also showed acute kidney injury with BUN of 20 and creatinine of 1.88. UA was positive for UTI. FOBT was negative. Patient given 1 g IV Rocephin, 1 unit PRBC and 1 unit of platelets and hospitalist transfer to [facility] Course:   Pancytopenia with excess blast, underlying history of myelodysplasia and refractory anemia anemia -Patient presented with markedly elevated WBC, elevated LDH, anemia, thrombocytopenia, concerning for MDS with leukemic progression -Oncology was consulted, underwent bone marrow biopsy on 02/05/2019, results still pending will be followed by Dr. Marin Olp.   Poor prognosis if has progressed to leukemia - Patient received 2 units packed RBC transfusion on 8/26 for hemoglobin of 6.7, received 1 unit plateletpheresis on 8/28 prior to discharge. -  Has ferritin > 7500, COVID-19 test negative -Continue home prednisone  Acute kidney injury on CKD stage III -Suspecting prerenal ATN, anemia, -Presented with creatinine of 1.6,  improved to 1.3 at the time of discharge.  History of BPH with bladder outlet obstruction, chronic indwelling Foley -Follows urology at [facility], last catheter change 1 week prior to admission -UA prior to admission showed bacteriuria, urine culture showed no growth   Essential hypertension -BP stable  Hyperlipidemia Continue statin  COPD -Continue inhalers, no wheezing, stable  Anxiety, depression Continue home meds, Zoloft, Wellbutrin  Generalized weakness Patient has home health PT, which will be resumed   Day of Discharge S: No complaints, wants to go home.  Platelets down to 19 K, receiving platelet pheresis prior to discharge.  BP (!) 158/84   Pulse 82   Temp 97.8 F (36.6 C)   Resp 16   Ht '5\' 9"'  (1.753 m)   Wt 75.4 kg   SpO2 94%   BMI 24.55 kg/m   Physical Exam: General: Alert and awake oriented x3 not in any  acute distress. HEENT: anicteric sclera, pupils reactive to light and accommodation CVS: S1-S2 clear no murmur rubs or gallops Chest: clear to auscultation bilaterally, no wheezing rales or rhonchi Abdomen: soft nontender, nondistended, normal bowel sounds Extremities: no cyanosis, clubbing or edema noted bilaterally Neuro: Cranial nerves II-XII intact, no focal neurological deficits   The results of significant diagnostics from this hospitalization (including imaging, microbiology, ancillary and laboratory) are listed below for reference.      Procedures/Studies:  Ct Biopsy  Result Date: 02/05/2019 CLINICAL DATA:  Acute myeloid leukemia. EXAM: CT GUIDED BONE MARROW ASPIRATION AND BIOPSY ANESTHESIA/SEDATION: Versed 2.0 mg IV, Fentanyl 100 mcg IV Total Moderate Sedation Time:   15 minutes. The patient's level of consciousness and physiologic status were continuously monitored during the procedure by Radiology nursing. PROCEDURE: The procedure risks, benefits, and alternatives were explained to the patient. Questions regarding the procedure were encouraged and answered. The patient understands and consents to the procedure. A time out was performed prior to initiating the procedure. The right gluteal region was prepped with chlorhexidine. Sterile gown and sterile gloves were used for the procedure. Local anesthesia was provided with 1% Lidocaine. Under CT guidance, an 11 gauge On Control bone cutting needle was advanced from a posterior approach into  the right iliac bone. Needle positioning was confirmed with CT. Initial non heparinized and heparinized aspirate samples were obtained of bone marrow. Core biopsy was performed via the On Control drill needle. COMPLICATIONS: None FINDINGS: Inspection of initial aspirate did reveal visible particles. Intact core biopsy sample was obtained. IMPRESSION: CT guided bone marrow biopsy of right posterior iliac bone with both aspirate and core samples obtained.  Electronically Signed   By: Aletta Edouard M.D.   On: 02/05/2019 10:35   Ct Bone Marrow Biopsy & Aspiration  Result Date: 02/05/2019 CLINICAL DATA:  Acute myeloid leukemia. EXAM: CT GUIDED BONE MARROW ASPIRATION AND BIOPSY ANESTHESIA/SEDATION: Versed 2.0 mg IV, Fentanyl 100 mcg IV Total Moderate Sedation Time:   15 minutes. The patient's level of consciousness and physiologic status were continuously monitored during the procedure by Radiology nursing. PROCEDURE: The procedure risks, benefits, and alternatives were explained to the patient. Questions regarding the procedure were encouraged and answered. The patient understands and consents to the procedure. A time out was performed prior to initiating the procedure. The right gluteal region was prepped with chlorhexidine. Sterile gown and sterile gloves were used for the procedure. Local anesthesia was provided with 1% Lidocaine. Under CT guidance, an 11 gauge On Control bone cutting needle was advanced from a posterior approach into the right iliac bone. Needle positioning was confirmed with CT. Initial non heparinized and heparinized aspirate samples were obtained of bone marrow. Core biopsy was performed via the On Control drill needle. COMPLICATIONS: None FINDINGS: Inspection of initial aspirate did reveal visible particles. Intact core biopsy sample was obtained. IMPRESSION: CT guided bone marrow biopsy of right posterior iliac bone with both aspirate and core samples obtained. Electronically Signed   By: Aletta Edouard M.D.   On: 02/05/2019 10:35       LAB RESULTS: Basic Metabolic Panel: Recent Labs  Lab 02/04/19 0448 02/06/19 0356  NA 138 139  K 3.6 3.7  CL 111 105  CO2 20* 22  GLUCOSE 95 134*  BUN 17 26*  CREATININE 1.29* 1.38*  CALCIUM 7.5* 8.6*   Liver Function Tests: Recent Labs  Lab 02/02/19 2209 02/04/19 0448  AST 62* 43*  ALT 12 11  ALKPHOS 41 28*  BILITOT 0.4 0.5  PROT 6.7 5.5*  ALBUMIN 3.0* 2.4*   No results for  input(s): LIPASE, AMYLASE in the last 168 hours. No results for input(s): AMMONIA in the last 168 hours. CBC: Recent Labs  Lab 02/05/19 0551 02/06/19 0356  WBC 16.3* 10.2  NEUTROABS 11.1*  --   HGB 10.9* 11.3*  HCT 32.5* 34.5*  MCV 100.0 100.6*  PLT 25* 19*   Cardiac Enzymes: No results for input(s): CKTOTAL, CKMB, CKMBINDEX, TROPONINI in the last 168 hours. BNP: Invalid input(s): POCBNP CBG: No results for input(s): GLUCAP in the last 168 hours.    Disposition and Follow-up: Discharge Instructions    Diet - low sodium heart healthy   Complete by: As directed    Increase activity slowly   Complete by: As directed        DISPOSITION: Home with home health   [place], Peter R, MD. Schedule an appointment as soon as possible for a visit in 1 week(s).   Specialty: Oncology Why: please call on Monday to confirm appointment Contact information: [place] [address] [phone]            Time coordinating discharge:  40 minutes  Signed:  Estill Cotta M.D. [facility] 02/06/2019, 1:30 PM

## 2019-02-06 NOTE — Progress Notes (Signed)
I must say that Anthony [name] this point looks a lot better.  His "color" looks good.  He still has the Foley catheter in.  He says that he will go home with the Foley catheter.  He says he has a urologist with the [facility] system.  He has bone marrow biopsy yesterday.  I will have the results back until next week.  His hemoglobin is doing great.  I am quite surprised by this.  His white cell count is come down.  His white cell count is 10.2.  His hemoglobin 11.3 and platelet count 19,000.  I will give him a unit of platelets.  I think this will be beneficial for him.  I suspect he probably has some element of platelet dysfunction.  His appetite is picked up a little bit.  There is been no fever.  He has had no cough.  He has had no obvious diarrhea.  Again he has the indwelling Foley catheter in.  On his physical exam, his vital signs show a temperature of 97.8.  Pulse 90.  Blood pressure 164/89.  Oxygen saturation room air is 94%.  Oral exam shows no mucositis.  There is no thrush.  His lungs sound pretty clear bilaterally.  There may be some occasional wheezes.  Cardiac exam regular rate and rhythm with a 1/6 systolic murmur.  Abdomen is soft.  There is no obvious hepatosplenomegaly.  Extremities shows a lot of ecchymoses on his arms.  He has some muscle atrophy in his legs.  Neurological exam is nonfocal.  Again, it looks like Anthony [name] could go home.  He has had the bone marrow biopsy done.  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  I will call him at home when I get the result back.  I need to make sure that we get the cytogenetics back.  Also, I need to get genetic information regarding the bone marrow.  This will help [place] decide as to whether or not we could treat this.  He will get his platelets today.  I think this will definitely help.  I appreciate everybody's help with him.  Lattie Haw, MD  Psalm 34:17

## 2019-02-06 NOTE — Progress Notes (Signed)
PT Note  Patient Details Name: Anthony [name] MRN: [id number] DOB: 23-Jun-1940     Checked on pt this morning for another session. Pt was evaluated by PT on 8/25, and worked with OT this morning. Pt stated he needed a rest at the moment and then states he is going home.  Pt stated he was receiving HHPT prior to admission and wants to continue that as well. Pt has all equipment,. Hospital bed, transport chair and RW. If pt still here later this afternoon will see, if not, continue with HHPT.     Clide Dales 02/06/2019, 11:35 AM Clide Dales, PT Acute Rehabilitation Services Pager: [phone] Office: [phone] 02/06/2019

## 2019-02-06 NOTE — Care Management Important Message (Signed)
Important Message  Patient Details IM Letter given to Cookie McGibboney RN to present to the Patient Name: NICKOLAUS LITWAK MRN: [id number] Date of Birth: [birth year]   Medicare Important Message Given:  Yes     Kerin Salen 02/06/2019, 11:21 AM

## 2019-02-06 NOTE — TOC Progression Note (Signed)
Transition of Care [facility]) - Progression Note    Patient Details  Name: Anthony [name] MRN: [id number] Date of Birth: Mar 22, 1941  Transition of Care [facility]) CM/SW Contact  Purcell Mouton, RN Phone Number: 02/06/2019, 12:04 PM  Clinical Narrative:    Pt was active with [facility] HHRN/PT/OT/NA. In house rep was called to inform that pt was being discharged and to see what the pt was active with.    Expected Discharge Plan: [place] Barriers to Discharge: Continued Medical Work up  Expected Discharge Plan and Services Expected Discharge Plan: [place] In-house Referral: Clinical Social Work   Post Acute Care Choice: [place] arrangements for the past 2 months: [place] Expected Discharge Date: 02/06/19                         HH Arranged: PT, RN, Nurse's Aide HH Agency: [place] Date [facility] Agency Contacted: 02/05/19 Time [place]: 77 Representative spoke with at [place]: spoke with drew   Social Determinants of Health ([place]) Interventions    Readmission Risk Interventions No flowsheet data found.

## 2019-02-06 NOTE — Progress Notes (Addendum)
Occupational Therapy Treatment Patient Details Name: ARTA BAZ MRN: [id number] DOB: 22-Aug-1940 Today's Date: 02/06/2019    History of present illness JAMMES RIZER is a 78 y.o. 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 He was found to have thrombocytopenia and anemia.   OT comments  Pt progressing toward stated goals, presents with low activity tolerance. Focused session on toileting, pt needing x2 toilet transfers for sensations of BM and mod A to complete hygiene. Pt completing toilet t/fs with RW to [facility] at min guard A for safety with increased time and effort. Due to urgency with second transfer pt completing stand pivot at min A. Continues to present generally weak, receiving platelets during session. Continue to recommend [place] with [place] aide to help decrease caregiver burden and facilitate pt independence and engagement in BADL. Pt has necessary home DME. Will continue to follow while acute.    Follow Up Recommendations  Home health OT;Supervision/Assistance - 24 hour;Other (comment)(HH aide)    Equipment Recommendations  None recommended by OT    Recommendations for Other Services      Precautions / Restrictions Precautions Precautions: Fall Precaution Comments: h/o falls Restrictions Weight Bearing Restrictions: No       Mobility Bed Mobility               General bed mobility comments: up in chair  Transfers Overall transfer level: Needs assistance Equipment used: Rolling walker (2 wheeled) Transfers: Sit to/from [facility] Sit to Stand: Min guard;Min assist Stand pivot transfers: Min guard;Min assist       General transfer comment: increased time  and effort and reliance on grab bars    Balance Overall balance assessment: Needs assistance;History of Falls Sitting-balance support: Feet supported;Bilateral upper extremity supported;Single extremity supported Sitting balance-Leahy Scale: Fair     Standing balance support: Bilateral upper extremity supported Standing balance-Leahy Scale: Poor Standing balance comment: reliant on external support                           ADL either performed or assessed with clinical judgement   ADL Overall ADL's : Needs assistance/impaired                         Toilet Transfer: Min guard;Minimal Electronics engineer Details (indicate cue type and reason): x2 to [place] Manipulation and Hygiene: Moderate assistance;Sit to/from stand       Functional mobility during ADLs: Min guard;Minimal assistance;Rolling walker General ADL Comments: pt fatigues easily, low activity tolerance     Vision Baseline Vision/History: No visual deficits     Perception     Praxis      Cognition Arousal/Alertness: Awake/alert Behavior During Therapy: Flat affect;WFL for tasks assessed/performed Overall Cognitive Status: Within Functional Limits for tasks assessed                                 General Comments: occasioanlly slow to process, but also [facility]        Exercises     Shoulder Instructions       General Comments      Pertinent Vitals/ Pain  Pain Assessment: No/denies pain  Home Living                                          Prior Functioning/Environment              Frequency  Min 2X/week        Progress Toward Goals  OT Goals(current goals can now be found in the care plan section)  Progress towards OT goals: Progressing toward goals  Acute Rehab OT Goals Patient Stated Goal: feel better, return home to wife Time For Goal Achievement: 02/17/19 Potential to Achieve  Goals: Good  Plan Discharge plan remains appropriate;Frequency remains appropriate    Co-evaluation                 AM-PAC OT "6 Clicks" Daily Activity     Outcome Measure   Help from another person eating meals?: A Little Help from another person taking care of personal grooming?: A Little Help from another person toileting, which includes using toliet, bedpan, or urinal?: A Lot Help from another person bathing (including washing, rinsing, drying)?: A Little Help from another person to put on and taking off regular upper body clothing?: A Little Help from another person to put on and taking off regular lower body clothing?: A Lot 6 Click Score: 16    End of Session Equipment Utilized During Treatment: Gait belt;Rolling walker  OT Visit Diagnosis: Unsteadiness on feet (R26.81);Muscle weakness (generalized) (M62.81);History of falling (Z91.81)   Activity Tolerance Patient limited by fatigue;Patient tolerated treatment well   Patient Left in chair;with call bell/phone within reach;with chair alarm set   Nurse Communication Mobility status        Time: [id number] OT Time Calculation (min): 20 min  Charges: OT Treatments $Self Care/Home Management : 8-22 mins  Zenovia Jarred, MSOT, OTR/L [place] Office: [place] 02/06/2019, 10:47 AM

## 2019-02-07 LAB — BPAM PLATELET PHERESIS
Blood Product Expiration Date: 202008282359
ISSUE DATE / TIME: 202008280817
Unit Type and Rh: 6200

## 2019-02-07 LAB — PREPARE PLATELET PHERESIS: Unit division: 0

## 2019-02-10 ENCOUNTER — Inpatient Hospital Stay (HOSPITAL_BASED_OUTPATIENT_CLINIC_OR_DEPARTMENT_OTHER): Payer: Medicare Other | Admitting: Hematology & Oncology

## 2019-02-10 ENCOUNTER — Telehealth: Payer: Self-pay | Admitting: *Deleted

## 2019-02-10 ENCOUNTER — Other Ambulatory Visit: Payer: Self-pay | Admitting: *Deleted

## 2019-02-10 ENCOUNTER — Inpatient Hospital Stay: Payer: Medicare Other | Attending: Hematology & Oncology

## 2019-02-10 ENCOUNTER — Telehealth: Payer: Self-pay | Admitting: Hematology & Oncology

## 2019-02-10 ENCOUNTER — Inpatient Hospital Stay: Payer: Medicare Other

## 2019-02-10 ENCOUNTER — Other Ambulatory Visit: Payer: Self-pay

## 2019-02-10 ENCOUNTER — Encounter: Payer: Self-pay | Admitting: Hematology & Oncology

## 2019-02-10 VITALS — BP 128/81 | HR 107 | Temp 97.1°F | Resp 17

## 2019-02-10 DIAGNOSIS — D4620 Refractory anemia with excess of blasts, unspecified: Secondary | ICD-10-CM

## 2019-02-10 DIAGNOSIS — D4621 Refractory anemia with excess of blasts 1: Secondary | ICD-10-CM | POA: Diagnosis not present

## 2019-02-10 DIAGNOSIS — D469 Myelodysplastic syndrome, unspecified: Secondary | ICD-10-CM | POA: Diagnosis present

## 2019-02-10 DIAGNOSIS — Z79899 Other long term (current) drug therapy: Secondary | ICD-10-CM | POA: Diagnosis not present

## 2019-02-10 DIAGNOSIS — D696 Thrombocytopenia, unspecified: Secondary | ICD-10-CM | POA: Diagnosis not present

## 2019-02-10 DIAGNOSIS — Z7189 Other specified counseling: Secondary | ICD-10-CM | POA: Diagnosis not present

## 2019-02-10 DIAGNOSIS — C9200 Acute myeloblastic leukemia, not having achieved remission: Secondary | ICD-10-CM

## 2019-02-10 DIAGNOSIS — Z95828 Presence of other vascular implants and grafts: Secondary | ICD-10-CM

## 2019-02-10 HISTORY — DX: Other specified counseling: Z71.89

## 2019-02-10 HISTORY — DX: Acute myeloblastic leukemia, not having achieved remission: C92.00

## 2019-02-10 LAB — CBC WITH DIFFERENTIAL (CANCER CENTER ONLY)
Abs Immature Granulocytes: 0.1 10*3/uL — ABNORMAL HIGH (ref 0.00–0.07)
Band Neutrophils: 10 %
Basophils Absolute: 0 10*3/uL (ref 0.0–0.1)
Basophils Relative: 0 %
Blasts: 3 %
Eosinophils Absolute: 0 10*3/uL (ref 0.0–0.5)
Eosinophils Relative: 0 %
HCT: 34.8 % — ABNORMAL LOW (ref 39.0–52.0)
Hemoglobin: 11.3 g/dL — ABNORMAL LOW (ref 13.0–17.0)
Lymphocytes Relative: 25 %
Lymphs Abs: 0.3 10*3/uL — ABNORMAL LOW (ref 0.7–4.0)
MCH: 32.8 pg (ref 26.0–34.0)
MCHC: 32.5 g/dL (ref 30.0–36.0)
MCV: 101.2 fL — ABNORMAL HIGH (ref 80.0–100.0)
Metamyelocytes Relative: 5 %
Monocytes Absolute: 0.1 10*3/uL (ref 0.1–1.0)
Monocytes Relative: 5 %
Myelocytes: 2 %
Neutro Abs: 0.8 10*3/uL — ABNORMAL LOW (ref 1.7–7.7)
Neutrophils Relative %: 50 %
Platelet Count: 7 10*3/uL — CL (ref 150–400)
RBC: 3.44 MIL/uL — ABNORMAL LOW (ref 4.22–5.81)
RDW: 18.6 % — ABNORMAL HIGH (ref 11.5–15.5)
WBC Count: 1.3 10*3/uL — ABNORMAL LOW (ref 4.0–10.5)
nRBC: 0 % (ref 0.0–0.2)

## 2019-02-10 LAB — CMP (CANCER CENTER ONLY)
ALT: 17 U/L (ref 0–44)
AST: 17 U/L (ref 15–41)
Albumin: 3.7 g/dL (ref 3.5–5.0)
Alkaline Phosphatase: 37 U/L — ABNORMAL LOW (ref 38–126)
Anion gap: 8 (ref 5–15)
BUN: 29 mg/dL — ABNORMAL HIGH (ref 8–23)
CO2: 27 mmol/L (ref 22–32)
Calcium: 9.2 mg/dL (ref 8.9–10.3)
Chloride: 101 mmol/L (ref 98–111)
Creatinine: 1.38 mg/dL — ABNORMAL HIGH (ref 0.61–1.24)
GFR, Est AFR Am: 56 mL/min — ABNORMAL LOW (ref >60)
GFR, Estimated: 48 mL/min — ABNORMAL LOW (ref >60)
Glucose, Bld: 113 mg/dL — ABNORMAL HIGH (ref 70–99)
Potassium: 4.8 mmol/L (ref 3.5–5.1)
Sodium: 136 mmol/L (ref 135–145)
Total Bilirubin: 0.4 mg/dL (ref 0.3–1.2)
Total Protein: 6.8 g/dL (ref 6.5–8.1)

## 2019-02-10 LAB — RETICULOCYTES
Immature Retic Fract: 0 % — ABNORMAL LOW (ref 2.3–15.9)
RBC.: 3.48 MIL/uL — ABNORMAL LOW (ref 4.22–5.81)
Retic Count, Absolute: 9 10*3/uL — ABNORMAL LOW (ref 19.0–186.0)
Retic Ct Pct: 0.3 % — ABNORMAL LOW (ref 0.4–3.1)

## 2019-02-10 LAB — SAVE SMEAR(SSMR), FOR PROVIDER SLIDE REVIEW

## 2019-02-10 MED ORDER — SODIUM CHLORIDE 0.9% FLUSH
10.0000 mL | INTRAVENOUS | Status: AC | PRN
  Administered 2019-02-10: 15:00:00 10 mL via INTRAVENOUS
  Filled 2019-02-10: qty 10

## 2019-02-10 MED ORDER — FLUCONAZOLE 100 MG PO TABS: 100.0000 mg | ORAL_TABLET | Freq: Every day | ORAL | 5 refills | Status: AC

## 2019-02-10 MED ORDER — FOLIC ACID 1 MG PO TABS: ORAL_TABLET | ORAL | 3 refills | Status: AC

## 2019-02-10 MED ORDER — HEPARIN SOD (PORK) LOCK FLUSH 100 UNIT/ML IV SOLN
500.0000 [IU] | Freq: Once | INTRAVENOUS | Status: AC
  Administered 2019-02-10: 500 [IU] via INTRAVENOUS
  Filled 2019-02-10: qty 5

## 2019-02-10 NOTE — Patient Instructions (Signed)

## 2019-02-10 NOTE — Telephone Encounter (Signed)
Dr. Marin Olp notified of platelet count of 7.  Order received for patient to get one unit of platelets tomorrow.

## 2019-02-10 NOTE — Telephone Encounter (Signed)
Called and LMVM for patient regarding appointment added for today per 8/31 sch msg.  I asked patient to call back and confirm

## 2019-02-10 NOTE — Progress Notes (Signed)
Hematology and Oncology Follow Up Visit  Anthony [name] [id number] 1941-01-19 78 y.o. 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  A long awaited follow-up.  We last saw him back in March.  Unfortunately, they have not come back to the office since then because of their concern regarding the coronavirus.  He is subsequently ended up in the hospital a week or so ago.  He had marked thrombocytopenia.  His white cell count was elevated.  He was quite anemic.  I looked at his blood smear.  I thought it was obvious that he had acute myeloid leukemia.  We did a bone marrow biopsy on him on 02/05/2019.  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  He had about 40% blasts.  He had cytogenetics that are pending.  He was taken off his venetoclax a while back.  He has been transfused with platelets.  It seems like thrombocytopenia is the biggest issue with this leukemia.  His white cell count was quite high in the hospital.  Today, his white cell count is on the lower side.  This might be an effect from the venetoclax.  I talked to he and his wife at length.  This is a very difficult problem he had that he has now.  I think the fact that his bone marrow was relatively normal back in February and now is markedly abnormal is a very ominous finding.  I would have to believe that he will have abnormal chromosomes.  The question is how many abnormal chromosomes he has.  I told him that my biggest fear was  a risk of infection.  Looks like he has a little bit of thrush already.  I will have to send in some Diflucan.  He will take 100 mg daily.  I will try him on the venetoclax/Dacogen protocol.  Hopefully, this will clear out his bone marrow again.  Again my biggest concern is the risk of infection for him.  We probably will have to have him on some kind of prophylaxis for the risk of neutropenia and infection.  I do not think that there will be an issue with tumor lysis.  Again we will have to monitor this closely.  He will need to have lab work done probably at least twice a week.  His appetite might be a little bit better.  He has no nausea or vomiting.  He has had no diarrhea.    He does have an indwelling Foley catheter.  Urology says they cannot do anything about this.  I would like to think that they might be able to put a leg bag on him to make it a little bit easier for him to get around.  At the present time, his performance status is probably no better than ECOG 2.    Medications:  Allergies as of 02/10/2019   No  Known Allergies     Medication List       Accurate as of February 10, 2019  4:53 PM. If you have any questions, ask your nurse or doctor.        albuterol (2.5 MG/3ML) 0.083% nebulizer solution Commonly known as: PROVENTIL Take 3 mLs (2.5 mg total) by nebulization every 6 (six) hours as needed for wheezing or shortness of breath.   bimatoprost 0.01 % Soln Commonly known as: LUMIGAN Place 1 drop into both eyes at bedtime.   budesonide-formoterol 160-4.5 MCG/ACT inhaler Commonly known as: SYMBICORT Inhale 2 puffs into the lungs 2 (two) times daily.   buPROPion 150 MG 24 hr tablet Commonly known as: WELLBUTRIN XL Take 150 mg by mouth daily.   CITRACAL PO Take 1,000 Units by mouth 2 (two) times daily.   co-enzyme Q-10 30 MG capsule Take 100 mg by mouth daily.   Cyanocobalamin 2500 MCG Tabs Take 5,000 mcg by mouth daily.   fish oil-omega-3 fatty acids  1000 MG capsule Take 1 g by mouth daily.   fluticasone 50 MCG/ACT nasal spray Commonly known as: FLONASE Place 1 spray into both nostrils daily.   folic acid 1 MG tablet Commonly known as: FOLVITE TAKE ONE TABLET (1 MG DOSE) BY MOUTH DAILY.   glucosamine-chondroitin 500-400 MG tablet Take 1 tablet by mouth daily.   levETIRAcetam 500 MG tablet Commonly known as: KEPPRA TAKE ONE TABLET (500 MG DOSE) BY MOUTH 2 (TWO) TIMES DAILY.   LORazepam 0.5 MG tablet Commonly known as: ATIVAN TAKE 1 TABLET BY MOUTH EVERY 6 HOURS AS NEEDED FOR NAUSEA OR VOMITING What changed: See the new instructions.   Neurontin 300 MG capsule Generic drug: gabapentin Take 300 mg by mouth daily.   pantoprazole 40 MG tablet Commonly known as: PROTONIX Take 40 mg by mouth daily.   predniSONE 20 MG tablet Commonly known as: DELTASONE TAKE 1 TABLET BY MOUTH ONCE DAILY WITH BREAKFAST What changed: See the new instructions.   prochlorperazine 10 MG tablet Commonly known as: COMPAZINE Take 1 tablet (10 mg total) by mouth every 6 (six) hours as needed (Nausea or vomiting).   sertraline 100 MG tablet Commonly known as: ZOLOFT Take 100 mg by mouth daily.   Simbrinza 1-0.2 % Susp Generic drug: Brinzolamide-Brimonidine Place 1 drop into both eyes daily.   simvastatin 40 MG tablet Commonly known as: ZOCOR Take 40 mg by mouth every other day.   tiotropium 18 MCG inhalation capsule Commonly known as: SPIRIVA Place 18 mcg into inhaler and inhale daily.   triamcinolone cream 0.1 % Commonly known as: KENALOG Apply 1 application topically 2 (two) times daily as needed (itching and redness).   Vitamin D3 125 MCG (5000 UT) Tabs Take 1 tablet by mouth daily.       Allergies: No Known Allergies  Past Medical History, Surgical history, Social history, and Family History were reviewed and updated.  Review of Systems: Review of Systems  Constitutional: Positive for malaise/fatigue.  HENT: Negative.    Eyes: Negative.   Respiratory: Negative.   Cardiovascular: Negative.   Gastrointestinal: Negative.   Genitourinary: Negative.   Musculoskeletal: Positive for back pain.  Skin: Negative.   Neurological: Negative.   Endo/Heme/Allergies: Negative.   Psychiatric/Behavioral: Negative.      Physical Exam:  temporal temperature is 97.1 F (36.2 C) (abnormal). His blood pressure is 128/81 and his pulse is 107 (abnormal). His respiration is 17 and oxygen saturation is 96%.   Wt Readings from Last 3 Encounters:  02/06/19 166 lb 3.6 oz (75.4 kg)  08/25/18 179 lb 1.6 oz (81.2 kg)  07/28/18 178 lb (80.7 kg)    Physical Exam Vitals signs reviewed.  HENT:     Head: Normocephalic and atraumatic.  Eyes:     Pupils: Pupils are equal, round, and reactive to light.  Neck:     Musculoskeletal: Normal range of motion.  Cardiovascular:     Rate and Rhythm: Normal rate and regular rhythm.     Heart sounds: Normal heart sounds.  Pulmonary:     Effort: Pulmonary effort is normal.     Breath sounds: Normal breath sounds.  Abdominal:     General: Bowel sounds are normal.     Palpations: Abdomen is soft.  Musculoskeletal: Normal range of motion.        General: No tenderness or deformity.  Lymphadenopathy:     Cervical: No cervical adenopathy.  Skin:    General: Skin is warm and dry.     Findings: No erythema or rash.  Neurological:     Mental Status: He is alert and oriented to person, place, and time.  Psychiatric:        Behavior: Behavior normal.        Thought Content: Thought content normal.        Judgment: Judgment normal.      Lab Results  Component Value Date   WBC 1.3 (L) 02/10/2019   HGB 11.3 (L) 02/10/2019   HCT 34.8 (L) 02/10/2019   MCV 101.2 (H) 02/10/2019   PLT 7 (LL) 02/10/2019   Lab Results  Component Value Date   FERRITIN >7,500 (H) 02/02/2019   IRON 103 02/02/2019   TIBC 255 02/02/2019   UIBC 152 02/02/2019   IRONPCTSAT 40 (H) 02/02/2019   Lab Results   Component Value Date   RETICCTPCT 0.3 (L) 02/10/2019   RBC 3.44 (L) 02/10/2019   RBC 3.48 (L) 02/10/2019   RETICCTABS 39.4 05/30/2015   Lab Results  Component Value Date   KAPLAMBRATIO 1.11 12/17/2016   Lab Results  Component Value Date   IGGSERUM 735 12/17/2016   IGMSERUM 79 12/17/2016   Lab Results  Component Value Date   TOTALPROTELP 7.3 01/01/2014   ALBUMINELP 55.1 (L) 01/01/2014   A1GS 6.9 (H) 01/01/2014   A2GS 8.9 01/01/2014   BETS 7.9 (H) 01/01/2014   BETA2SER 6.7 (H) 01/01/2014   GAMS 14.5 01/01/2014   MSPIKE Not Observed 12/17/2016   SPEI * 01/01/2014     Chemistry      Component Value Date/Time   NA 136 02/10/2019 1430   NA 141 05/06/2017 0901   NA 140 04/16/2016 0930   K 4.8 02/10/2019 1430   K 3.8 05/06/2017 0901   K 3.9 04/16/2016 0930   CL 101 02/10/2019 1430   CL 105 05/06/2017 0901   CO2 27 02/10/2019 1430   CO2 28 05/06/2017 0901   CO2 24 04/16/2016 0930   BUN 29 (H) 02/10/2019 1430   BUN 27 (H) 05/06/2017 0901   BUN 20.5 04/16/2016 0930   CREATININE 1.38 (H) 02/10/2019 1430   CREATININE 1.4 (H) 05/06/2017 0901   CREATININE 1.2 04/16/2016 0930      Component Value Date/Time   CALCIUM 9.2 02/10/2019 1430   CALCIUM 8.8 05/06/2017 0901   CALCIUM 8.9 04/16/2016 0930   ALKPHOS 37 (L) 02/10/2019 1430   ALKPHOS 46 05/06/2017 0901   ALKPHOS 54 04/16/2016 0930   AST 17 02/10/2019 1430   AST 16 04/16/2016 0930  ALT 17 02/10/2019 1430   ALT 25 05/06/2017 0901   ALT 21 04/16/2016 0930   BILITOT 0.4 02/10/2019 1430   BILITOT 0.27 04/16/2016 0930      Impression and Plan: Anthony [name] is a very pleasant 78 yo caucasian gentleman with 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   Again, it will be interesting to see what his cytogenetics are.  I have to suspect that the trisomy 11 is back in addition to some other  abnormalities.  I think we will know fairly quickly whether or not treatment is going to work.  Response will be dictated by his thrombocytopenia resolution.  We will try to get started next week.  I realize it is a 4-day workweek but I think we have to get going so that we can try to get his bone marrow "cleared out."  We will have to monitor him closely.  He will need lab work twice a week while he is getting treated.  He is at significant risk for complications.  I spent about 45 minutes with he and his wife today.  This acute myeloid leukemia is truly a major problem for him and I just have a feeling that we are going to run into problems with respect to complications from the AML in addition to his treatment protocol.  In essence, we will have to follow him weekly.   Volanda Napoleon, MD 9/1/20204:53 PM

## 2019-02-10 NOTE — Addendum Note (Signed)
Addended by: Cottie Banda on: 02/10/2019 02:44 PM   Modules accepted: Orders, SmartSet

## 2019-02-11 ENCOUNTER — Inpatient Hospital Stay: Payer: Medicare Other

## 2019-02-11 ENCOUNTER — Other Ambulatory Visit: Payer: Self-pay | Admitting: Family

## 2019-02-11 DIAGNOSIS — D46Z Other myelodysplastic syndromes: Secondary | ICD-10-CM

## 2019-02-11 DIAGNOSIS — D469 Myelodysplastic syndrome, unspecified: Secondary | ICD-10-CM | POA: Diagnosis not present

## 2019-02-11 DIAGNOSIS — D4620 Refractory anemia with excess of blasts, unspecified: Secondary | ICD-10-CM

## 2019-02-11 LAB — IRON AND TIBC
Iron: 179 ug/dL — ABNORMAL HIGH (ref 42–163)
Saturation Ratios: 86 % — ABNORMAL HIGH (ref 20–55)
TIBC: 208 ug/dL (ref 202–409)
UIBC: 28 ug/dL — ABNORMAL LOW (ref 117–376)

## 2019-02-11 MED ORDER — SODIUM CHLORIDE 0.9% IV SOLUTION
250.0000 mL | Freq: Once | INTRAVENOUS | Status: AC
  Administered 2019-02-11: 09:00:00 250 mL via INTRAVENOUS
  Filled 2019-02-11: qty 250

## 2019-02-11 MED ORDER — HEPARIN SOD (PORK) LOCK FLUSH 100 UNIT/ML IV SOLN
500.0000 [IU] | Freq: Every day | INTRAVENOUS | Status: AC | PRN
  Administered 2019-02-11: 500 [IU]
  Filled 2019-02-11: qty 5

## 2019-02-11 MED ORDER — SODIUM CHLORIDE 0.9% FLUSH
10.0000 mL | INTRAVENOUS | Status: AC | PRN
  Administered 2019-02-11: 10 mL
  Filled 2019-02-11: qty 10

## 2019-02-11 MED ORDER — SODIUM CHLORIDE 0.9% FLUSH
3.0000 mL | INTRAVENOUS | Status: DC | PRN
  Filled 2019-02-11: qty 10

## 2019-02-11 NOTE — Patient Instructions (Signed)
Thrombocytopenia Thrombocytopenia means that you have a low number of platelets in your blood. Platelets are tiny cells in the blood. When you bleed, they clump together at the cut or injury to stop the bleeding. This is called blood clotting. If you do not have enough platelets, it can cause bleeding problems. Some cases of this condition are mild while others are more severe. What are the causes? This condition may be caused by:  Your body not making enough platelets. This may be caused by: ? Your bone marrow not making blood cells (aplastic anemia). ? Cancer in the bone marrow. ? Certain medicines. ? Infection in the bone marrow. ? Drinking a lot of alcohol.  Your body destroying platelets too quickly. This may be caused by: ? Certain immune diseases. ? Certain medicines. ? Certain blood clotting disorders. ? Certain disorders that are passed from parent to child (inherited). ? Certain bleeding disorders. ? Pregnancy. ? Having a spleen that is larger than normal. What are the signs or symptoms?  Bleeding that is not normal.  Nosebleeds.  Heavy menstrual periods.  Blood in the pee (urine) or poop (stool).  A purple-like color to the skin (purpura).  Bruising.  A rash that looks like pinpoint, purple-red spots (petechiae). How is this treated?  Treatment of another condition that is causing the low platelet count.  Medicines to help protect your platelets from being destroyed.  A replacement (transfusion) of platelets to stop or prevent bleeding.  Surgery to remove the spleen. Follow these instructions at home: Activity  Avoid activities that could cause you to get hurt or bruised. Follow instructions about how to prevent falls.  Take care not to cut yourself: ? When you shave. ? When you use scissors, needles, knives, or other tools.  Take care not to burn yourself: ? When you use an iron. ? When you cook. General instructions   Check your skin and the  inside of your mouth for bruises or blood as told by your doctor.  Check to see if there is blood in your spit (sputum), pee, and poop. Do this as told by your doctor.  Do not drink alcohol.  Take over-the-counter and prescription medicines only as told by your doctor.  Do not take any medicines that have aspirin or NSAIDs in them. These medicines can thin your blood and cause you to bleed.  Tell all of your doctors that you have this condition. Be sure to tell your dentist and eye doctor too. Contact a doctor if:  You have bruises and you do not know why. Get help right away if:  You are bleeding anywhere on your body.  You have blood in your spit, pee, or poop. Summary  Thrombocytopenia means that you have a low number of platelets in your blood.  Platelets are needed for blood clotting.  Symptoms of this condition include bleeding that is not normal, and bruising.  Take care not to cut or burn yourself. This information is not intended to replace advice given to you by your health care provider. Make sure you discuss any questions you have with your health care provider. Document Released: 05/17/2011 Document Revised: 02/27/2018 Document Reviewed: 02/27/2018 Elsevier Patient Education  2020 [employer]  

## 2019-02-12 ENCOUNTER — Encounter (HOSPITAL_COMMUNITY): Payer: Self-pay | Admitting: Hematology & Oncology

## 2019-02-12 LAB — PREPARE PLATELET PHERESIS: Unit division: 0

## 2019-02-12 LAB — BPAM PLATELET PHERESIS
Blood Product Expiration Date: 202009042359
ISSUE DATE / TIME: 202009020732
Unit Type and Rh: 5100

## 2019-02-12 LAB — FERRITIN: Ferritin: 14871 ng/mL — ABNORMAL HIGH (ref 24–336)

## 2019-02-17 ENCOUNTER — Inpatient Hospital Stay: Payer: Medicare Other

## 2019-02-17 ENCOUNTER — Encounter: Payer: Self-pay | Admitting: *Deleted

## 2019-02-17 ENCOUNTER — Other Ambulatory Visit: Payer: Self-pay | Admitting: Hematology & Oncology

## 2019-02-17 NOTE — Progress Notes (Signed)
Call received to scheduler from pt.'s wife stating that she would like to cancel all of patient's appts d/t patient has decided on hospice this weekend.  Dr. Marin Olp notified.

## 2019-02-17 NOTE — Progress Notes (Signed)
Wife made a referral to [facility]. I received call today from Hospice nurse needing records. Faxed records to [phone].

## 2019-02-18 ENCOUNTER — Other Ambulatory Visit: Payer: Medicare Other

## 2019-02-18 ENCOUNTER — Inpatient Hospital Stay: Payer: Medicare Other

## 2019-02-19 ENCOUNTER — Ambulatory Visit: Payer: Medicare Other

## 2019-02-20 ENCOUNTER — Ambulatory Visit: Payer: Medicare Other

## 2019-02-20 ENCOUNTER — Other Ambulatory Visit: Payer: Medicare Other

## 2019-02-23 ENCOUNTER — Other Ambulatory Visit: Payer: Medicare Other

## 2019-02-23 ENCOUNTER — Ambulatory Visit: Payer: Medicare Other | Admitting: Family

## 2019-02-24 ENCOUNTER — Other Ambulatory Visit: Payer: Medicare Other

## 2019-02-24 ENCOUNTER — Telehealth: Payer: Self-pay | Admitting: Pulmonary Disease

## 2019-02-24 NOTE — Telephone Encounter (Signed)
Spoke with pt's spouse Abner Greenspan, who advised that pt passed away yesterday.  I expressed condolences, and spouse was grateful.  Forwarding to BQ as FYI.

## 2019-02-25 ENCOUNTER — Other Ambulatory Visit: Payer: Medicare Other

## 2019-02-25 NOTE — Telephone Encounter (Signed)
I'm so sorry to hear this.  Thanks for letting me know

## 2019-02-26 ENCOUNTER — Other Ambulatory Visit: Payer: Medicare Other

## 2019-02-27 ENCOUNTER — Other Ambulatory Visit: Payer: Medicare Other

## 2019-03-02 ENCOUNTER — Other Ambulatory Visit: Payer: Medicare Other

## 2019-03-05 ENCOUNTER — Other Ambulatory Visit: Payer: Medicare Other

## 2019-03-09 ENCOUNTER — Other Ambulatory Visit: Payer: Medicare Other

## 2019-03-12 ENCOUNTER — Other Ambulatory Visit: Payer: Medicare Other

## 2019-03-12 DEATH — deceased

## 2019-03-16 ENCOUNTER — Other Ambulatory Visit: Payer: Medicare Other

## 2019-03-19 ENCOUNTER — Other Ambulatory Visit: Payer: Medicare Other
